# Patient Record
Sex: Male | Born: 1950
Health system: Southern US, Community
[De-identification: ages and names within clinical notes are randomized; demographics above are authoritative.]

## PROBLEM LIST (undated history)

## (undated) DIAGNOSIS — E7800 Pure hypercholesterolemia, unspecified: Secondary | ICD-10-CM

## (undated) DIAGNOSIS — E11319 Type 2 diabetes mellitus with unspecified diabetic retinopathy without macular edema: Secondary | ICD-10-CM

## (undated) DIAGNOSIS — K219 Gastro-esophageal reflux disease without esophagitis: Secondary | ICD-10-CM

## (undated) DIAGNOSIS — I10 Essential (primary) hypertension: Secondary | ICD-10-CM

## (undated) DIAGNOSIS — R29818 Other symptoms and signs involving the nervous system: Secondary | ICD-10-CM

## (undated) DIAGNOSIS — G4730 Sleep apnea, unspecified: Secondary | ICD-10-CM

## (undated) DIAGNOSIS — G8929 Other chronic pain: Secondary | ICD-10-CM

## (undated) DIAGNOSIS — Z87442 Personal history of urinary calculi: Secondary | ICD-10-CM

## (undated) DIAGNOSIS — N419 Inflammatory disease of prostate, unspecified: Secondary | ICD-10-CM

## (undated) DIAGNOSIS — R911 Solitary pulmonary nodule: Secondary | ICD-10-CM

## (undated) DIAGNOSIS — I441 Atrioventricular block, second degree: Secondary | ICD-10-CM

## (undated) DIAGNOSIS — N183 Chronic kidney disease, stage 3 unspecified: Secondary | ICD-10-CM

## (undated) DIAGNOSIS — I2510 Atherosclerotic heart disease of native coronary artery without angina pectoris: Secondary | ICD-10-CM

## (undated) DIAGNOSIS — M545 Low back pain, unspecified: Secondary | ICD-10-CM

## (undated) DIAGNOSIS — M1990 Unspecified osteoarthritis, unspecified site: Secondary | ICD-10-CM

## (undated) DIAGNOSIS — Z9861 Coronary angioplasty status: Secondary | ICD-10-CM

## (undated) DIAGNOSIS — E119 Type 2 diabetes mellitus without complications: Secondary | ICD-10-CM

## (undated) HISTORY — PX: BACK SURGERY: SHX140

## (undated) HISTORY — DX: Inflammatory disease of prostate, unspecified: N41.9

## (undated) HISTORY — DX: Type 2 diabetes mellitus with unspecified diabetic retinopathy without macular edema: E11.319

## (undated) HISTORY — DX: Morbid (severe) obesity due to excess calories: E66.01

## (undated) HISTORY — DX: Solitary pulmonary nodule: R91.1

---

## 2001-01-21 ENCOUNTER — Encounter: Payer: Self-pay | Admitting: Family Medicine

## 2001-01-21 ENCOUNTER — Encounter: Admission: RE | Admit: 2001-01-21 | Discharge: 2001-01-21 | Payer: Self-pay | Admitting: Family Medicine

## 2001-11-23 HISTORY — PX: CORONARY ANGIOPLASTY WITH STENT PLACEMENT: SHX49

## 2001-12-23 ENCOUNTER — Ambulatory Visit (HOSPITAL_COMMUNITY): Admission: RE | Admit: 2001-12-23 | Discharge: 2001-12-24 | Payer: Self-pay | Admitting: Cardiology

## 2002-04-23 HISTORY — PX: CARDIAC CATHETERIZATION: SHX172

## 2002-04-25 ENCOUNTER — Ambulatory Visit (HOSPITAL_COMMUNITY): Admission: RE | Admit: 2002-04-25 | Discharge: 2002-04-25 | Payer: Self-pay | Admitting: Cardiology

## 2002-09-19 ENCOUNTER — Encounter: Admission: RE | Admit: 2002-09-19 | Discharge: 2002-09-19 | Payer: Self-pay | Admitting: Family Medicine

## 2002-09-19 ENCOUNTER — Encounter: Payer: Self-pay | Admitting: Family Medicine

## 2002-12-15 ENCOUNTER — Encounter: Admission: RE | Admit: 2002-12-15 | Discharge: 2002-12-15 | Payer: Self-pay | Admitting: *Deleted

## 2002-12-15 ENCOUNTER — Encounter: Payer: Self-pay | Admitting: Family Medicine

## 2003-01-15 ENCOUNTER — Encounter: Payer: Self-pay | Admitting: Family Medicine

## 2003-01-15 ENCOUNTER — Encounter: Admission: RE | Admit: 2003-01-15 | Discharge: 2003-01-15 | Payer: Self-pay | Admitting: Family Medicine

## 2003-03-07 ENCOUNTER — Ambulatory Visit (HOSPITAL_BASED_OUTPATIENT_CLINIC_OR_DEPARTMENT_OTHER): Admission: RE | Admit: 2003-03-07 | Discharge: 2003-03-07 | Payer: Self-pay | Admitting: Urology

## 2003-03-14 ENCOUNTER — Ambulatory Visit (HOSPITAL_BASED_OUTPATIENT_CLINIC_OR_DEPARTMENT_OTHER): Admission: RE | Admit: 2003-03-14 | Discharge: 2003-03-14 | Payer: Self-pay | Admitting: Urology

## 2003-03-14 HISTORY — PX: OTHER SURGICAL HISTORY: SHX169

## 2003-08-03 ENCOUNTER — Encounter (INDEPENDENT_AMBULATORY_CARE_PROVIDER_SITE_OTHER): Payer: Self-pay | Admitting: Specialist

## 2003-08-03 ENCOUNTER — Ambulatory Visit (HOSPITAL_COMMUNITY): Admission: RE | Admit: 2003-08-03 | Discharge: 2003-08-03 | Payer: Self-pay | Admitting: Gastroenterology

## 2006-03-30 ENCOUNTER — Ambulatory Visit (HOSPITAL_COMMUNITY): Admission: AD | Admit: 2006-03-30 | Discharge: 2006-03-31 | Payer: Self-pay | Admitting: Cardiology

## 2006-03-30 HISTORY — PX: CORONARY ANGIOPLASTY WITH STENT PLACEMENT: SHX49

## 2007-11-24 HISTORY — PX: OTHER SURGICAL HISTORY: SHX169

## 2008-07-31 ENCOUNTER — Ambulatory Visit (HOSPITAL_COMMUNITY): Admission: RE | Admit: 2008-07-31 | Discharge: 2008-08-01 | Payer: Self-pay | Admitting: Specialist

## 2011-02-24 ENCOUNTER — Encounter: Payer: Self-pay | Admitting: Family Medicine

## 2011-02-24 DIAGNOSIS — Z9861 Coronary angioplasty status: Secondary | ICD-10-CM

## 2011-02-24 DIAGNOSIS — E1165 Type 2 diabetes mellitus with hyperglycemia: Secondary | ICD-10-CM | POA: Insufficient documentation

## 2011-02-24 DIAGNOSIS — E119 Type 2 diabetes mellitus without complications: Secondary | ICD-10-CM | POA: Insufficient documentation

## 2011-02-24 DIAGNOSIS — I2510 Atherosclerotic heart disease of native coronary artery without angina pectoris: Secondary | ICD-10-CM | POA: Insufficient documentation

## 2011-02-24 DIAGNOSIS — E785 Hyperlipidemia, unspecified: Secondary | ICD-10-CM | POA: Insufficient documentation

## 2011-02-24 DIAGNOSIS — K219 Gastro-esophageal reflux disease without esophagitis: Secondary | ICD-10-CM | POA: Insufficient documentation

## 2011-02-24 DIAGNOSIS — I10 Essential (primary) hypertension: Secondary | ICD-10-CM

## 2011-02-24 DIAGNOSIS — E7849 Other hyperlipidemia: Secondary | ICD-10-CM | POA: Insufficient documentation

## 2011-04-07 NOTE — Op Note (Signed)
NAME:  Erik Hancock, Erik Hancock                ACCOUNT NO.:  [id number]   MEDICAL RECORD NO.:  [id number]          PATIENT TYPE:  OIB   LOCATION:  2550                         FACILITY:  [facility]   PHYSICIAN:  Kerrin Champagne, M.D.   DATE OF BIRTH:  [birth year]   DATE OF PROCEDURE:  DATE OF DISCHARGE:                               OPERATIVE REPORT   PREOPERATIVE DIAGNOSIS:  Left C6-C7, herniated nucleus pulposus,  degenerative disk disease C4-C5, C5-C6 and C6-C7 with mild cervical  stenosis C4-C5, C5-C6 and C6-C7.   POSTOPERATIVE DIAGNOSIS:  Left C6-C7, herniated nucleus pulposus,  degenerative disk disease C4-C5, C5-C6 and C6-C7 with mild cervical  stenosis C4-C5, C5-C6 and C6-C7.   PROCEDURE:  Left C6-C7 [place] foraminotomy with excision of herniated  nucleus pulposus.   SURGEON:  Vira Browns, M.D.   ASSISTANT:  Wende Neighbors, P.A.   ANESTHESIA:  General via orotracheal intubation Dr. Gelene Mink.   FINDINGS:  HNP of 3 free fragments left C6-C7.   SPECIMENS:  None.   ESTIMATED BLOOD LOSS:  100 mL.   COMPLICATIONS:  None.   DISPOSITION:  The patient returned to PACU in good condition.   HISTORY OF PRESENT ILLNESS:  A 60 year old male with a roughly 3 week  history of increasing severe neck pain and radiation in left arm.  He  was seen initially in the office and placed on Medrol Dosepak.  He  underwent MRI study which demonstrated HNP in the left C6-C7.  He has  had severe pain secondary to OxyContin and has a history of cardiac  disease.  He was seen by his cardiologist with preoperative evaluation.  He was admitted with intractable left cervical radiculopathy and pain  requiring narcotic medicines without significant improvement with  significant weakness in left triceps, left wrist, volar flexion and  finger extension.  MRI scan of left C6-C7 revealed lateral disk  herniation, mild cervical stenosis C5-C6, and C6-C7 with degenerative  disk changes at C4-C5, C5-C6 and C6-C7.   Intraoperative findings, HNP  with 3 free fragments at left C6-C7.   DESCRIPTION OF PROCEDURE:  After adequate general anesthesia, the  patient was placed in a prone position, chest rolls and head  on the  [place], well padded.  Taped shoulders backwards.   Wrist restraints to allow for x-ray intraoperatively.  The patient had  PAS stockings intraoperatively, diminished risk of deep venous  thrombosis.  Standard prep under prep solution over the posterior aspect  of the neck.  The patient also had standard preoperative antibiotics of  Ancef.  An incision was made over the posterior aspect of the neck at  the most prominent spinous process expected to be C7 and extended  cranially 1-1/5 through the skin and subcutaneous layers.  Bleeders were  controlled using electrocautery down to the ligament of nuchae overlying  the spinous process at expected C7 and C6.  Electrocautery was then used  to incise the ligament of nuchae off the posterior aspect of the spinous  process at left C6 and C7.  A clamp placed over the expected spinous  process at C6 and  intraoperative lateral radiographs demonstrated the  spinous process at C6 with clamp attached.  This area was marked for  continued identification using a single #1 Vicryl suture.  Clamps were  then used carefully to elevate the cervical muscles off the left side of  the lamina of C6 and C7 dividing using electrocautery.  Bleeders were  controlled using mono and bipolar electrocautery.  Boss McCullough  retractor inserted at the expected C6-C7 level.  Posterior aspect of the  left side of the posterior elements was exposed using loupe  magnification and head lamp.  During this portion of the procedure,  bleeders again were controlled using electrocautery.  A high speed bur  was used to remove a small portion of the inferior aspect of the lamina  at C6 and medial aspect of C6 inferior articular process of  approximately 10 to 15%.  A 1  millimeter Kerrison was entered into the  spinal canal over the superior aspect of the lamina at C7 and then  continued resection of small amount of superior lateral aspect of left  C7 lamina continuing laterally resecting a small portion of the medial  aspect of the superior articular process of C7 up to the level of the  left C7 pedicle.  Foraminotomy performed over the C7 nerve root.  A  small fibrovascular lesion overlying the C7 nerve roots then carefully  elevated with titanium nerve hook and cauterized and divided.  Operating  room microscope draped sterilely and brought into the field.  Under the  operating room microscope, the C7 nerve root was well decompressed for  foraminotomy allowing for exposure of at least the lateral 3 to 4  millimeters of the nerve root entering and exiting on to the  neuroforamen.  A straight and forward angle 3-0 microcurette was used to  debride the small amount of the superior medial aspect of the pedicle at  C7 allowing for retraction of the C7 nerve root superiorly with  identification of the disk herniation here.  Bleeding was controlled  during this portion of the case using thrombin-soaked Gelfilm and small  quarter-inch pledgets.  One-eighth inch pledgets were used as well.  Eventually. this material was able to be removed.  At least 3 free  fragments of this material were removed from the posterior lateral  aspect of the left disk space.  Following this, all venous bleedings  diminished.  The C7 nerve root appeared to exit out the neural foramen  without any further nerve compression.  Bleeding was stabilized using  thrombin-soaked Gelfilm irrigation.  All bleeding was completely stopped  and all Gelfilm was removed.  Nerve root appeared to be exiting off the  neuroforamen without any further nerve compression.  Soft tissues were  allowed to fall back into place.  The ligament of nuchal layer was  approximated with interrupted 0 Vicryl sutures  using the UR6 needle.  Deep subcutaneous layers were approximated with interrupted 0 Vicryl  suture and more superficial layers with interrupted 2-0 Vicryl sutures.  Skin was closed with a running subcu stitch of 4-0 Vicryl.  Dermabond  applied.  4 x 4s was fixed to the skin with Hypafix  tape.  The patient  had a soft cervical collar applied.  The patient was then returned to  supine position, reactivated, extubated and returned to recovery room in  satisfactory condition.  Both intraoperatively, time-out was taken to  identify the patient, the correct side, and the expected procedure for  C6-C7 foraminotomy.  The patient also had a mark inclined to the left  lower neck in the preoperative holding area in order to identify the  site and the correct level.  All instrument and sponge counts were  correct at the end of the case.      Kerrin Champagne, M.D.  Electronically Signed     JEN/MEDQ  D:  07/31/2008  T:  08/01/2008  Job:  [id number]

## 2011-04-10 NOTE — Cardiovascular Report (Signed)
Erik Hancock, FANT NO.:  [id number]   MEDICAL RECORD NO.:  [id number]          PATIENT TYPE:  OIB   LOCATION:  2854                         FACILITY:  [facility]   PHYSICIAN:  Armanda Magic, M.D.     DATE OF BIRTH:  [birth year]   DATE OF PROCEDURE:  03/30/2006  DATE OF DISCHARGE:                              CARDIAC CATHETERIZATION   REFERRING PHYSICIAN:  Dr. Lupe Carney   PROCEDURE:  1.  Left heart catheterization.  2.  Coronary angiography.  3.  Left ventriculography.   OPERATOR:  Armanda Magic, M.D.   INDICATIONS:  Chest pain.   COMPLICATIONS:  IV access via right femoral artery 6-French sheath.   This is a very pleasant 60 year old white male with a history of coronary  disease status post PTCA stenting of the LAD and non-obstructive disease  elsewhere who presents now with recurrent angina and now presents for  cardiac catheterization.   The patient is brought to cardiac catheterization laboratory in the fasting  nonsedated state.  Informed consent was obtained.  Patient was connected to  continuous heart rate and pulse oximetry monitoring, intermittent blood  pressure monitoring.  The right groin was prepped and draped in sterile  fashion.  1% Xylocaine was used for local anesthesia.  Using a modified  Seldinger technique a 6-French sheath was placed in the right femoral  artery.  Under fluoroscopic guidance a 6-French JL4 catheter was placed in  the vicinity of the left coronary ostium, but could not adequately engage  it.  It was exchanged out over a guidewire for a 6-French JL5 catheter which  successfully engaged the left coronary ostium.  Multiple cine films were  taken in a 30 degree RAO, 40 degree LAO views.  This catheter was then  exchanged out over a guidewire for a 6-French JR4 catheter which was placed  under guide fluoroscopic guidance in the right coronary artery.  Multiple  cine films were taken in the 30 degree RAO, 40 degree LAO  views.  This  catheter was then exchanged out over a guidewire for a 6-French angled  pigtail catheter which was placed under fluoroscopic guidance in the left  ventricular cavity.  Left ventriculography was performed in a 30 degree RAO  view using a total of 30 mL of contrast at 15 mL per second.  Catheter was  then pulled back across the aortic valve with no significant gradient noted.  At the end of procedure the catheter was removed over a guidewire and the  patient went on to PCI of the left circumflex and PDA by Dr. Amil Amen.   RESULTS:  The left main coronary artery is widely patent.  It bifurcates in  the left anterior descending artery and left circumflex artery.  The left  anterior descending artery is widely patent throughout its course.  There is  a stent in the proximal portion which is widely patent.  It gives rise to  two diagonal branches both of which are widely patent.   The left circumflex gives rise to a very high obtuse one branch which is  widely  patent.  The proximal left circumflex is widely patent and then in  the mid portion there is a 30% narrowing.  In the mid to distal portion  there is an 90% complex lesion involving the takeoff of an OM2 branch.  The  OM2 branch appears patent.  The ongoing circumflex traverses the AV groove  and bifurcates into a third obtuse marginal branch distally.   The right coronary artery is widely patent throughout its course and  distally bifurcates into posterior descending artery and posterior lateral  artery.  There is a 95% mid stenosis in the posterior descending artery.   Left ventriculography shows low normal LV systolic function, EF 50-55%.  LV  pressure 150/8 mmHg.  Aortic pressure 160/78 mmHg   ASSESSMENT:  1.  Two-vessel obstructive coronary disease, 90% left circumflex, 95%      posterior descending artery, and a patent left anterior descending      stent.  2.  Low normal left ventricular function.  3.   Hypertension with elevated blood pressure.  4.  Non-insulin-dependent diabetes mellitus.  5.  Dyslipidemia.   PLAN:  PCI of the left circumflex PDA by Dr. Amil Amen.  Aspirin and Plavix.  Increase lisinopril to 30 mg a day for better blood pressure control and  check a fasting Statin panel.      Armanda Magic, M.D.  Electronically Signed     TT/MEDQ  D:  03/30/2006  T:  03/31/2006  Job:  [id number]

## 2011-04-10 NOTE — Op Note (Signed)
NAME:  Erik Hancock, Erik Hancock                          ACCOUNT NO.:  [id number]   MEDICAL RECORD NO.:  [id number]                   PATIENT TYPE:  AMB   LOCATION:  [facility]                                 FACILITY:  [facility]   PHYSICIAN:  Maretta Bees. Vonita Moss, M.D.             DATE OF BIRTH:  June 15, 1951   DATE OF PROCEDURE:  03/14/2003  DATE OF DISCHARGE:                                 OPERATIVE REPORT   PREOPERATIVE DIAGNOSES:  Left ureteral calculus.   POSTOPERATIVE DIAGNOSES:  Left ureteral calculus.   PROCEDURE:  Cystoscopy, left ureteroscopy with stone fragmentation with  holmium laser and stone basketing, left retrograde pyelogram with  interpretation, left double J catheter insertion.   SURGEON:  Maretta Bees. Vonita Moss, M.D.   ANESTHESIA:  General.   INDICATIONS FOR PROCEDURE:  This 60 year old gentleman has had intermittent  pain since earlier this year due to a 5 mm stone in the upper ureter that  has moved down to the lower ureter and one week ago cystoscopy, ureteroscopy  and partial fragmentation of the stone was accomplished, but because of  narrowed tight area where the stone is, it was felt best to leave in the  double J and come back for a second procedure for safety purposes at which  time it was expected that ureteroscopy would be easier with a double J  catheter in for a week.   DESCRIPTION OF PROCEDURE:  The patient was brought to the operating room,  placed in lithotomy position, external genitalia were prepped and draped in  the usual fashion. He was cystoscoped and the end of the left double J  catheter was pulled out and a guidewire placed up through the double J  catheter and into the kidney and the double J catheter was then pulled out  over the wire. I then used a 6 French ureteroscope and this time was easily  able to get up to the stone. It looked too big to extract with a basket, so  I fragmented it into several pieces with the holmium laser. I then used the  nitinol  stone basket to retrieve the large pieces and there was some fine  sandy material that would not get in the basket but should wash out easily.  I then ureteroscoped him further proximally and Erik Hancock no further stones. I  injected contrast and he had some residual caliectasis at the site where the  stone was that was just minor mucosal disruption but no extravasation. I  then put up a 6 French 28 cm double J catheter with a full coil in the renal  pelvis and a full coil in the bladder and the spring brought out per  urethra. Then I recystoscoped and washed out at least three of the stone  fragments which were given to his wife. The bladder was emptied, the scope  removed and the patient sent to the recovery room in good  condition having  tolerated the procedure well.                                               Maretta Bees. Vonita Moss, M.D.    LJP/MEDQ  D:  03/14/2003  T:  03/14/2003  Job:  [id number]

## 2011-04-10 NOTE — Cardiovascular Report (Signed)
[place]. [facility]  Patient:    JAMONTA, GOERNER Visit Number: [id number] MRN: [id number]          Service Type: CAT Location: [facility] [id number] Attending Physician:  Armanda Magic Dictated by:   Armanda Magic, M.D. Proc. Date: 04/25/02 Admit Date:  04/25/2002 Discharge Date: 04/25/2002   CC:         Melvyn Neth D. Clovis Riley, M.D.   Cardiac Catheterization  DATE OF BIRTH:  06/29/1951  REFERRING PHYSICIAN:  Abran Cantor. Clovis Riley, M.D.  CHIEF COMPLAINT:  A 60 year old male with a history of hyperlipidemia and coronary disease status post PTCA and stenting of the LAD in January 2003.  He now presents with recurrent chest pain.  PROCEDURE:  Left heart catheterization, coronary angiography, left ventriculography.  OPERATOR:  Armanda Magic, M.D.  INDICATIONS:  Chest pain and coronary disease.  COMPLICATIONS:  None.  IV ACCESS:  Via right femoral artery, #6 French sheath.  DESCRIPTION OF PROCEDURE:  The patient was brought to the cardiac catheterization laboratory in the fasting nonsedated state.  Informed consent was obtained.  The patient was connected to continuous heart rate and pulse oximetry monitoring and intermittent blood pressure monitoring.  The right groin was prepped and draped in a sterile fashion.  Xylocaine, 1%, was used for local anesthesia.  Using the modified Seldinger technique, a #6 French sheath was placed in the right femoral artery.  Under fluoroscopic guidance, a #6 [place] JL4 catheter was placed in the left coronary artery.  Multiple cine films were taken in a 30-degree RAO, 40-degree LAO view.  This catheter was then exchanged out over a guidewire for a #6 [place] JR4 catheter which was placed in the right coronary artery under fluoroscopic guidance.  Multiple cine films were taken in the 30-degree RAO, 40-degree LAO view.  This catheter was then exchanged out over a guidewire for a #6 French angled pigtail catheter which was placed  under fluoroscopic guidance in the left ventricular cavity.  Left ventriculography was performed in a 30-degree RAO view using a total of 30 cc of contrast at 13 cc per second.  The catheter was then pulled back across the aortic valve with no significant gradient noted.  At the end of the procedure, all catheters and sheaths were removed.  Manual compression was performed until adequate hemostasis was obtained.  The patient was transferred back to the room in stable condition.  RESULTS: 1. The left main coronary artery is widely patent and bifurcates into the    left anterior descending artery and left circumflex artery.  2. The left anterior descending artery is widely patent throughout its course.    It gives rise to two diagonal branches, both of which are widely patent.  3. The left circumflex gives rise to two high obtuse marginal branches, both    of which are small vessels but widely patent.  Distal to the takeoff of the    second marginal branch, there is a 30% narrowing in the left circumflex    before giving off a large third marginal branch which is widely patent.    The ongoing circumflex has a 30% narrowing after the takeoff of the third    obtuse marginal branch.  4. The right coronary artery is widely patent throughout its course and    bifurcates distally into a posterior descending artery and posterolateral    artery.  The posterior descending artery has a mid 60-70% narrowing.  LEFT VENTRICULOGRAPHY:  Left ventriculography performed  in the 30-degree RAO view using a total of 30 cc of contrast at 13 cc per second showed normal LV systolic function.  LV pressure was 144/17 mmHg.  Aortic pressure was 142/90 mmHg.  ASSESSMENT: 1. Patent left anterior descending artery stent site. 2. Nonobstructive coronary disease with borderline obstructive disease of    the posterior descending artery. 3. Normal left ventricular function.  PLAN: 1. Review of films by Dr. Corliss Marcus with probably stress Cardiolite to further evaluate the significance of the posterior descending artery lesion    for ischemia. 2. IV fluids and bed rest for 6 hours. 3. Recheck statin panel. Dictated by:   Armanda Magic, M.D. Attending Physician:  Armanda Magic DD:  04/25/02 TD:  04/26/02 Job: [id number]

## 2011-04-10 NOTE — Cardiovascular Report (Signed)
[place]. [facility]  Patient:    Erik Hancock, Erik Hancock Visit Number: [id number] MRN: [id number]          Service Type: CAT Location: 6500 [id number] Attending Physician:  Armanda Magic Dictated by:   Francisca December, M.D. Proc. Date: 12/23/01 Admit Date:  12/23/2001   CC:         Armanda Magic, M.D.  Abran Cantor. Clovis Riley, M.D.  Cardiac Catheterization Laboratory   Cardiac Catheterization  PROCEDURES PERFORMED: 1. Percutaneous coronary intervention/stent implantation, mid LAD. 2. Percutaneous closure, right femoral artery/Perclose.  INDICATION: The patient is a 60 year old man with diabetes, hypertension, and hypercholesterolemia, who has developed exertional angina. Dr. Mayford Knife has completed coronary angiography revealing a mid LAD stenosis of 80-90% involving a small to moderate sized diagonal branch. He is to undergo percutaneous intervention at this time for definitive revascularization.  DESCRIPTION OF PROCEDURE: PCI was performed following the exchange of the 6 French catheter sheath placed by Dr. Mayford Knife for a 7 French catheter sheath over a long guiding J wire. The patient received 5700 units of heparin intravenously. He also received a double bolus of Integrilin in constant infusion. This resulted in an ACT of 231 seconds. A 7 [place] FL4, Scimed, wiseguide, guiding catheter was advanced to the ascending aorta where the left coronary os was engaged. The 0.014 inch Scimed, luge intracoronary guide wire was passed across the lesion in the mid LAD without difficulty. A 0.014 Guidant ACS Hi-Torque Floppy guide wire was advanced into the diagonal branch. A 3.5/15 mm Scimed, Express II intracoronary stent was placed across the lesion in the mid LAD carefully and deployed there to a peak pressure of 15 atmospheres for one minute. This resulted in wide patency. The guide wire in the diagonal branch was then removed. There was no compromise at the origin of the  diagonal branch. The guide wire in the LAD was removed and cineangiography performed in orthogonal views confirming wide patency. The guiding catheter was then removed as well as the guiding sheath. Arteriotomy closure was successful following the use of the Perclose device.  A 45 degree RAO right femoral angiogram was performed prior to deployment of this device confirming the arteriotomy to be above the bifurcation into the superficial femoral and posterior profunda femoral branches. There was no evidence of atherosclerosis in the common femoral artery. There was superficial "oozing" at the skin entry sites. This was resolved with the use of the [place]-seal. A sterile dressing was applied and the patient was transported to the recovery area in stable condition with an intact distal pulse.  ANGIOGRAPHY: As mentioned, the lesion treated was in the midportion of the anterior descending artery and extended for about 8 to 10 mm in its severest form. It was over 20 mm in length total. The distal portion of the lesion was very mild. Therefore I did not plan to cover this portion with the stents. Following stent implantation with balloon dilatation, there was wide patency in both the diagonal branch and the anterior descending artery with no significant residual stenosis.  FINAL IMPRESSION: 1. Atherosclerotic coronary vascular disease, single-vessel. 2. Status post successful percutaneous transluminal coronary angioplasty stent    implantation, mid left anterior descending artery. 3. Typical angina was reproduced with device insertion and balloon inflation. Dictated by:   Francisca December, M.D. Attending Physician:  Armanda Magic DD:  12/23/01 TD:  12/23/01 Job: [id number]

## 2011-04-10 NOTE — Cardiovascular Report (Signed)
[name], [name] NO.:  [id number]   MEDICAL RECORD NO.:  [id number]          PATIENT TYPE:  OIB   LOCATION:  2807                         FACILITY:  [facility]   PHYSICIAN:  Francisca December, M.D.  DATE OF BIRTH:  1951/06/27   DATE OF PROCEDURE:  03/30/2006  DATE OF DISCHARGE:                              CARDIAC CATHETERIZATION   PROCEDURES PERFORMED:  1.  PCI/drug-eluting stent implantation posterior descending artery.  2.  PCI/drug-eluting stent implantation mid left circumflex.   INDICATIONS:  [name] is a 60 year old diabetic man with known  ASCVD several years status post PCI/DE stent LAD.  He has done well since  then.  He has recently presented to Dr. Mayford Knife with recurrent anginal chest  discomfort.  She has completed cardiac catheterization which as revealed  subtotal stenoses in the left circumflex and poster descending artery.  He  is to undergo catheter-based revascularization at this time.   PROCEDURAL NOTE:  Via the previously placed 6-French catheter sheath, a 6-  [place] #4 right guiding catheter was advanced to the descending aorta where  the right coronary os was engaged.  The patient received 50 units/kg of IV  heparin and a 25 mcg/kg bolus of Aggrastat and constant infusion.  The  resultant ACT was 216 seconds.  He then received an additional 1500 units of  heparin, increasing the ACT to 306 seconds.  A 0.014-inch Luge intracoronary  guidewire was passed across the lesion in the posterior descending artery  without difficulty.  It was predilated using a 2.5/15 mm Scimed Maverick  intracoronary balloon.  This was inflated to 6 atmospheres for approximately  30 seconds.  The balloon was deflated removed and a 3.0/16 mm Scimed Taxus  intracoronary drug-eluting stent was advanced into place, carefully  positioned, and deployed at peak pressure of 16 atmospheres.  The stent  balloon was deflated and removed and the device was post dilated  initially  with a 3.25/12 mm Quantum Maverick intracoronary balloon inflated to 18-20  atmospheres.  This still allowed a slight residual at the site of the focal  95% stenosis in the poster descending artery.  Therefore, a 3.5/12 mm  Quantum Maverick was advanced into place and inflated to 18 atmospheres.  This resulted in an excellent angiographic appearance of the lesion.  This  was confirmed in orthogonal views both with and without the guidewire in  place.  The guiding catheter was then removed and replaced with a 6-French  #3.5 left Voda guiding catheter.  The Luge wire was again used to cross a  90% stenosis in the mid portion of the left circumflex.  This was at the  origin of a large marginal branch.  The lesion was primarily stented using a  3.0/20 mm Scimed Taxus intracoronary drug-eluting stent.  This was deployed  at a peak pressure of 14 atmospheres.  The stent balloon was deflated and  removed and the device was post dilated using a 3.25/12 mm Quantum Maverick.  This was inflated in the distal segment to 16 atmospheres and in the  proximal segment to 16  atmospheres.  There was excellent angiographic result  with only minimal narrowing at the origin of the large marginal branch.   The guidewire and guiding catheter were removed and adequate patency  confirmed in orthogonal views.  The catheter sheath was then sutured into  place and the patient was transported to recovery area in stable condition  with an intact distal pulse.   ANGIOGRAPHY:  As mentioned, the initial lesion treated was in the mid  portion of the posterior descending artery.  It was 95% stenotic and quite  focal.  Following balloon dilatation and stent implantation, there was a 10%  residual stenosis.  The lesion in the left circumflex, again, was in the mid  portion at the origin of a large marginal branch.  It is the anatomic second  marginal branch but the first large marginal branch.  The bulk of the  lesion  was opposite the origin of the marginal branch.  Following balloon  dilatation and stent implantation, there was no residual stenosis in the  circumflex and its ongoing branch.  There was a 50% stenosis in the origin  of a large marginal branch.   FINAL IMPRESSION:  1.  Atherosclerotic cardiovascular disease, three-vessel.  2.  Widely patent drug-eluting stent, anterior descending artery.  3.  Status post successful percutaneous coronary intervention/drug-eluting      stent implantation posterior descending artery.  4.  Status post successful percutaneous coronary intervention/drug-eluting      stent implantation mid left circumflex.  5.  Typical angina was not reproduced with device insertion or balloon      inflation.      Francisca December, M.D.  Electronically Signed     JHE/MEDQ  D:  03/30/2006  T:  03/31/2006  Job:  [id number]

## 2011-04-10 NOTE — Op Note (Signed)
NAME:  Erik Hancock, Erik Hancock                          ACCOUNT NO.:  [id number]   MEDICAL RECORD NO.:  [id number]                   PATIENT TYPE:  AMB   LOCATION:  [facility]                                 FACILITY:  [facility]   PHYSICIAN:  Maretta Bees. Vonita Moss, M.D.             DATE OF BIRTH:  04/06/1951   DATE OF PROCEDURE:  03/07/2003  DATE OF DISCHARGE:                                 OPERATIVE REPORT   PREOPERATIVE DIAGNOSIS:  Distal left ureteral calculus.   POSTOPERATIVE DIAGNOSIS:  Distal left ureteral calculus.   PROCEDURE:  1. Cystoscopy.  2. Left retrograde pyelogram with interpretation.  3. Left ureteroscopy and Holmium laser stone fragmentation and placement of     left double J catheter.   SURGEON:  Maretta Bees. Vonita Moss, M.D.   ANESTHESIA:  General.   INDICATIONS:  This 60 year old white male was sent for office consultation  in February for evaluation of intermittent left flank pain and a CT scan  that showed hydronephrosis due to a small upper ureteral calculus.  He was  pain-free for a while, but then has come back with several days of severe  left flank pain.  A repeat CT scan now shows hydronephrosis down to a stone  in the distal left ureter.  It measured 3 x 5 mm in size.  It could not be  seen on previous KUB before.  It was felt that the ureteroscopy was  appropriate and the patient wished something be done because of the severe  pain and symptoms.   PROCEDURE:  The patient was brought to the operating room, placed in  lithotomy position.  External genitalia were prepped and draped in the usual  fashion.  He was cystoscoped.  Anterior urethra and prostatic urethra were  unremarkable.  The bladder had no lesions in it.  The ureteral orifices were  fairly small.  A guidewire was placed up the left ureter beyond this very  poorly visualized stone.  I then did a balloon dilation of the intramural  ureter with the 4 cm balloon to 10 atmospheres of pressure.  I then inserted  a  6 French short ureteroscope and could get up just to below the stone.  I  could not reach the stone with the scope and repeated a balloon dilation and  rescoped him and still could see the stone, but could not get right up to it  or beyond it.  I tried the Nitinol and Segura stone baskets and none of them  would engage the stone.  It seemed to be adherent to the mucosal wall.  I  then went ahead with the Holmium laser therapy and was able to fragment some  chips off the distal end of the stone but then visualization became  difficult due to poor irrigation flow.  I felt that further attempts at this  point may risk damage to the ureter and I felt it  best to put up the double  J catheter which I did after performing left retrograde pyelogram which  showed the pyelocaliceal system to be intact with mild hydronephrosis.  I  utilized a 6 [place] 26 cm double J catheter which coiled nicely in the upper  calix and a full coil in the bladder.  I will bring him back to the OR in  one week for repeat ureteroscopy if he does not pass his stone in the  meantime.  I believe we have a much better chance and less risk of second  attempt to remove this difficult impacted stone.  He was then taken to  recovery room in good condition.                                               Maretta Bees. Vonita Moss, M.D.   LJP/MEDQ  D:  03/07/2003  T:  03/07/2003  Job:  [id number]

## 2011-04-10 NOTE — Cardiovascular Report (Signed)
[place]  Patient:    Erik Hancock, Erik Hancock Visit Number: [id number] MRN: [id number]          Service Type: CAT Location: [id number] Attending Physician:  Armanda Magic Dictated by:   Armanda Magic, M.D. Proc. Date: 12/23/01 Admit Date:  12/23/2001   CC:         Melvyn Neth D. Clovis Riley, M.D.   Cardiac Catheterization  CHIEF COMPLAINT:  This is a 60 year old white male with a history of diabetes and hypertension, who presents with several episodes of exertional angina.  He now presents for cardiac catheterization.  PROCEDURES PERFORMED: 1. Left heart catheterization. 2. Coronary angiography. 3. Left ventriculography.  OPERATOR:  Armanda Magic, M.D.  INDICATIONS:  Exertional angina.  COMPLICATIONS:  None.  IV ACCESS:  Via right femoral artery, 6-French sheath.  DESCRIPTION OF PROCEDURE:   The patient was brought to the cardiac catheterization laboratory in the fasting nonsedated state.  Informed consent was obtained.  The patient was connected to continuous heart rate and pulse oximetry monitoring with intermittent blood pressure monitoring.  The right groin was prepped and draped in a sterile fashion.  One percent Xylocaine was used for local anesthesia.  Using the modified Seldinger technique, a 6-French sheath was placed in the right femoral artery.  Under fluoroscopic guidance, a 6-French JL-4 catheter was placed in the left coronary artery.  Multiple cine films were taken in the 30 degree RAO and 40 degree LAO views.  This catheter was then exchanged out over a guide wire for a 6-French JR-4 catheter which was placed under fluoroscopic guidance in the right coronary artery.  Multiple cine films were taken in the 30 degree RAO and 40 degree LAO views.  This catheter was then exchanged out over a guide wire for a 6-French angled pigtail catheter which was placed in the left ventricular cavity.  Left ventriculography was performed in the 30  degree RAO view using a total of 30 cc of contrast at 13 cc/sec.  The catheter was then pulled back across the aortic valve.  At the end of the procedure, the catheter was removed and the 6-French femoral sheath was exchanged out for a 7-French femoral sheath.  The patient subsequently went on to percutaneous transluminal intervention by Dr. Corliss Marcus.  RESULTS:  The left main coronary artery is widely patent and trifurcates into the left anterior descending artery, a small ramus branch, and a left circumflex artery.  The left anterior descending artery has an 80% proximal narrowing at the takeoff of the first diagonal branch.  The diagonal is widely patent.  The rest of the left anterior descending artery is widely patent throughout its course, giving rise to a second diagonal branch which is widely patent.  The left circumflex has a 40% eccentric plaque in the proximal to mid portion and then bifurcates distally into two ______ branches, both of which have bifurcating ______ branches distally.  The ramus branch appears widely patent, but is a very small vessel.  The right coronary artery is widely patent throughout its course and bifurcates distally into a posterior descending artery and posterolateral artery.  The posterior descending artery has a mid 40-50% narrowing.  LEFT VENTRICULOGRAPHY:  Left ventriculography performed in the 30 degree RAO view using a total of 30 cc of contrast at 13 cc/sec showed normal left ventricular function.  There are no systolic wall motion abnormalities noted. There is no mitral regurgitation.  HEMODYNAMICS:  Aortic pressure is 127/80 mmHg.  Left ventricular pressure 131/2 mmHg.  ASSESSMENT: 1. Exertional angina. 2. One-vessel obstructive coronary disease. 3. Normal left ventricular function.  PLAN:  PCI per Dr. Amil Amen.  Continue aspirin and Plavix.  Resume previous medications. Dictated by:   Armanda Magic, M.D. Attending Physician:   Armanda Magic DD:  12/23/01 TD:  12/23/01 Job: [id number]

## 2011-08-26 LAB — CBC
MCHC: 32.9
MCV: 89.4
RBC: 5.49

## 2011-08-26 LAB — PROTIME-INR: INR: 0.9

## 2011-08-26 LAB — HEMOGLOBIN A1C
Hgb A1c MFr Bld: 11.1 — ABNORMAL HIGH
Mean Plasma Glucose: 272

## 2011-08-26 LAB — DIFFERENTIAL
Basophils Relative: 1
Eosinophils Absolute: 0.2
Monocytes Absolute: 0.6
Monocytes Relative: 7
Neutrophils Relative %: 58

## 2011-08-26 LAB — GLUCOSE, CAPILLARY: Glucose-Capillary: 254 — ABNORMAL HIGH

## 2011-08-26 LAB — BASIC METABOLIC PANEL
BUN: 14
CO2: 25
CO2: 29
Chloride: 101
Chloride: 98
Creatinine, Ser: 0.82
Creatinine, Ser: 0.99
GFR calc Af Amer: >60
Glucose, Bld: 260 — ABNORMAL HIGH

## 2011-11-24 DIAGNOSIS — R911 Solitary pulmonary nodule: Secondary | ICD-10-CM

## 2011-11-24 HISTORY — DX: Solitary pulmonary nodule: R91.1

## 2012-04-01 ENCOUNTER — Emergency Department (HOSPITAL_COMMUNITY)
Admission: EM | Admit: 2012-04-01 | Discharge: 2012-04-01 | Disposition: A | Discharge status: Nursing Home (Includes ICF) | Payer: BC Managed Care – PPO | Source: Home / Self Care | Attending: Family Medicine | Admitting: Family Medicine

## 2012-04-01 ENCOUNTER — Emergency Department (HOSPITAL_COMMUNITY)
Admission: EM | Admit: 2012-04-01 | Discharge: 2012-04-02 | Disposition: A | Discharge status: Home / Self Care | Payer: BC Managed Care – PPO | Attending: Emergency Medicine | Admitting: Emergency Medicine

## 2012-04-01 ENCOUNTER — Encounter (HOSPITAL_COMMUNITY): Payer: Self-pay | Admitting: Emergency Medicine

## 2012-04-01 ENCOUNTER — Encounter (HOSPITAL_COMMUNITY): Payer: Self-pay | Admitting: *Deleted

## 2012-04-01 DIAGNOSIS — N201 Calculus of ureter: Secondary | ICD-10-CM | POA: Insufficient documentation

## 2012-04-01 DIAGNOSIS — Z79899 Other long term (current) drug therapy: Secondary | ICD-10-CM | POA: Insufficient documentation

## 2012-04-01 DIAGNOSIS — N200 Calculus of kidney: Secondary | ICD-10-CM

## 2012-04-01 DIAGNOSIS — E7800 Pure hypercholesterolemia, unspecified: Secondary | ICD-10-CM | POA: Insufficient documentation

## 2012-04-01 DIAGNOSIS — Z7982 Long term (current) use of aspirin: Secondary | ICD-10-CM | POA: Insufficient documentation

## 2012-04-01 DIAGNOSIS — R911 Solitary pulmonary nodule: Secondary | ICD-10-CM | POA: Insufficient documentation

## 2012-04-01 DIAGNOSIS — R109 Unspecified abdominal pain: Secondary | ICD-10-CM | POA: Insufficient documentation

## 2012-04-01 DIAGNOSIS — R350 Frequency of micturition: Secondary | ICD-10-CM | POA: Insufficient documentation

## 2012-04-01 DIAGNOSIS — I10 Essential (primary) hypertension: Secondary | ICD-10-CM | POA: Insufficient documentation

## 2012-04-01 DIAGNOSIS — R319 Hematuria, unspecified: Secondary | ICD-10-CM | POA: Insufficient documentation

## 2012-04-01 DIAGNOSIS — E119 Type 2 diabetes mellitus without complications: Secondary | ICD-10-CM | POA: Insufficient documentation

## 2012-04-01 HISTORY — DX: Essential (primary) hypertension: I10

## 2012-04-01 HISTORY — DX: Pure hypercholesterolemia, unspecified: E78.00

## 2012-04-01 LAB — POCT URINALYSIS DIP (DEVICE)
Ketones, ur: 40 mg/dL — AB
Leukocytes, UA: NEGATIVE
pH: 5.5 (ref 5.0–8.0)

## 2012-04-01 MED ORDER — HYDROMORPHONE HCL PF 1 MG/ML IJ SOLN
2.0000 mg | Freq: Once | INTRAMUSCULAR | Status: AC
  Administered 2012-04-01: 2 mg via INTRAMUSCULAR

## 2012-04-01 MED ORDER — ONDANSETRON HCL 4 MG/2ML IJ SOLN
4.0000 mg | Freq: Once | INTRAMUSCULAR | Status: AC
  Administered 2012-04-01: 4 mg via INTRAMUSCULAR

## 2012-04-01 MED ORDER — ONDANSETRON HCL 4 MG/2ML IJ SOLN
INTRAMUSCULAR | Status: AC
  Filled 2012-04-01: qty 2

## 2012-04-01 MED ORDER — HYDROMORPHONE HCL PF 1 MG/ML IJ SOLN
INTRAMUSCULAR | Status: AC
  Filled 2012-04-01: qty 2

## 2012-04-01 NOTE — ED Notes (Signed)
PT HERE WITH LEFT FLANK INTERMITT PAIN THAT FLARED UP X 3 DYS AGO WITH HEMATURIA BUT EASED UP THEN RESTARTED YESTERDAY WITH CONSTANT SHARP,THROB PAIN.HX KIDNEY STONES IN THE PAST.PT STATES HE CALLED PCP BUT WAS TOLD TO F/U WITH UROLOGIST.PT ALSO REPORTS OF FREQ N/V.NO FEVERS,CHILLS

## 2012-04-01 NOTE — ED Notes (Signed)
Pt states history of kidney stones, pt states that pain started on left side Monday. Pt was passing blood in urine for past three days. Pt states still very painful.

## 2012-04-01 NOTE — ED Provider Notes (Signed)
History     CSN: [id number]  Arrival date & time 04/01/12  1957   None     No chief complaint on file.   (Consider location/radiation/quality/duration/timing/severity/associated sxs/prior treatment) Patient is a 61 y.o. male presenting with flank pain. The history is provided by the patient. No language interpreter was used.  Flank Pain This is a new problem. The problem occurs constantly. The problem has been gradually worsening. Associated symptoms include abdominal pain. The symptoms are aggravated by nothing. The symptoms are relieved by nothing. He has tried nothing for the symptoms. The treatment provided moderate relief.    No past medical history on file.  No past surgical history on file.  No family history on file.  History  Substance Use Topics  . Smoking status: Not on file  . Smokeless tobacco: Not on file  . Alcohol Use: Not on file      Review of Systems  Gastrointestinal: Positive for abdominal pain.  Genitourinary: Positive for flank pain.  All other systems reviewed and are negative.    Allergies  Review of patient's allergies indicates not on file.  Home Medications   Current Outpatient Rx  Name Route Sig Dispense Refill  . ASPIRIN 81 MG PO CHEW Oral Chew 81 mg by mouth daily.      [name] CLOPIDOGREL BISULFATE 75 MG PO TABS Oral Take 75 mg by mouth daily.      . INSULIN GLARGINE 100 UNIT/ML [place] SOLN Subcutaneous Inject 20 Units into the skin at bedtime.      [name] LISINOPRIL 40 MG PO TABS Oral Take 40 mg by mouth daily.      [name] METFORMIN HCL 1000 MG PO TABS Oral Take 1,000 mg by mouth 2 (two) times daily with a meal.      . METOPROLOL SUCCINATE ER 25 MG PO TB24 Oral Take 25 mg by mouth daily.      [name] PANTOPRAZOLE SODIUM 40 MG PO TBEC Oral Take 40 mg by mouth daily.      [name] PIOGLITAZONE HCL 30 MG PO TABS Oral Take 30 mg by mouth daily.      [name] SIMVASTATIN 20 MG PO TABS Oral Take 20 mg by mouth at bedtime.        There were no vitals taken for this  visit.  Physical Exam  Nursing note and vitals reviewed. Constitutional: He is oriented to person, place, and time. He appears well-developed and well-nourished.  HENT:  Head: Normocephalic and atraumatic.  Right Ear: External ear normal.  Left Ear: External ear normal.  Nose: Nose normal.  Mouth/Throat: Oropharynx is clear and moist.  Eyes: Conjunctivae and EOM are normal. Pupils are equal, round, and reactive to light.  Neck: Normal range of motion.  Cardiovascular: Normal rate.   Pulmonary/Chest: Effort normal.  Abdominal: Soft.  Musculoskeletal: Normal range of motion.  Neurological: He is alert and oriented to person, place, and time. He has normal reflexes.  Skin: Skin is warm.  Psychiatric: He has a normal mood and affect.    ED Course  Procedures (including critical care time)  Labs Reviewed - No data to display No results found.   No diagnosis found.    MDM    Pt to ED for evaluation  Pt having flank pain and vomitting     Lonia Skinner Th[place], [place] 04/01/12 2107

## 2012-04-02 ENCOUNTER — Emergency Department (HOSPITAL_COMMUNITY): Payer: BC Managed Care – PPO

## 2012-04-02 LAB — POCT I-STAT, CHEM 8
Creatinine, Ser: 1.3 mg/dL (ref 0.50–1.35)
HCT: 47 % (ref 39.0–52.0)
Hemoglobin: 16 g/dL (ref 13.0–17.0)
Potassium: 3.9 mEq/L (ref 3.5–5.1)
Sodium: 141 mEq/L (ref 135–145)
TCO2: 23 mmol/L (ref 0–100)

## 2012-04-02 LAB — URINALYSIS, ROUTINE W REFLEX MICROSCOPIC
Nitrite: NEGATIVE
Specific Gravity, Urine: 1.031 — ABNORMAL HIGH (ref 1.005–1.030)
pH: 5 (ref 5.0–8.0)

## 2012-04-02 LAB — CBC
Hemoglobin: 14.9 g/dL (ref 13.0–17.0)
MCH: 29.3 pg (ref 26.0–34.0)
RBC: 5.09 MIL/uL (ref 4.22–5.81)
WBC: 13.2 10*3/uL — ABNORMAL HIGH (ref 4.0–10.5)

## 2012-04-02 LAB — DIFFERENTIAL
Eosinophils Absolute: 0 10*3/uL (ref 0.0–0.7)
Lymphocytes Relative: 10 % — ABNORMAL LOW (ref 12–46)
Lymphs Abs: 1.4 10*3/uL (ref 0.7–4.0)
Monocytes Relative: 6 % (ref 3–12)
Neutrophils Relative %: 83 % — ABNORMAL HIGH (ref 43–77)

## 2012-04-02 LAB — URINE MICROSCOPIC-ADD ON

## 2012-04-02 MED ORDER — ONDANSETRON 8 MG PO TBDP: ORAL_TABLET | ORAL | Status: AC

## 2012-04-02 MED ORDER — SODIUM CHLORIDE 0.9 % IV BOLUS (SEPSIS)
500.0000 mL | Freq: Once | INTRAVENOUS | Status: AC
  Administered 2012-04-02: 500 mL via INTRAVENOUS

## 2012-04-02 MED ORDER — KETOROLAC TROMETHAMINE 30 MG/ML IJ SOLN
30.0000 mg | Freq: Once | INTRAMUSCULAR | Status: AC
  Administered 2012-04-02: 30 mg via INTRAVENOUS
  Filled 2012-04-02: qty 1

## 2012-04-02 MED ORDER — TAMSULOSIN HCL 0.4 MG PO CAPS: 0.4000 mg | ORAL_CAPSULE | Freq: Every day | ORAL | Status: DC

## 2012-04-02 MED ORDER — OXYCODONE-ACETAMINOPHEN 5-325 MG PO TABS: 1.0000 | ORAL_TABLET | Freq: Four times a day (QID) | ORAL | Status: DC | PRN

## 2012-04-02 MED ORDER — ONDANSETRON HCL 4 MG/2ML IJ SOLN
4.0000 mg | Freq: Once | INTRAMUSCULAR | Status: AC
  Administered 2012-04-02: 4 mg via INTRAVENOUS
  Filled 2012-04-02: qty 2

## 2012-04-02 NOTE — Discharge Instructions (Signed)

## 2012-04-02 NOTE — ED Provider Notes (Addendum)
History     CSN: [id number]  Arrival date & time 04/01/12  2136   First MD Initiated Contact with Patient 04/02/12 0006      Chief Complaint  Patient presents with  . Flank Pain    (Consider location/radiation/quality/duration/timing/severity/associated sxs/prior treatment) Patient is a 61 y.o. male presenting with flank pain. The history is provided by the patient. No language interpreter was used.  Flank Pain This is a recurrent problem. The current episode started more than 2 days ago. Episode frequency: episodically. The problem has been gradually worsening. Pertinent negatives include no chest pain, no abdominal pain, no headaches and no shortness of breath. The symptoms are aggravated by nothing. The symptoms are relieved by nothing. He has tried nothing for the symptoms. The treatment provided no relief.  Pain in left flank with gross hematuria since Monday.    Past Medical History  Diagnosis Date  . Diabetes mellitus   . Hypertension   . High cholesterol   . Renal disorder     kidney stone    Past Surgical History  Procedure Date  . Ureteral stent placement   . Carotid stent     2002 or 2003 pt unsure    History reviewed. No pertinent family history.  History  Substance Use Topics  . Smoking status: Never Smoker   . Smokeless tobacco: Not on file  . Alcohol Use: No      Review of Systems  Respiratory: Negative for shortness of breath.   Cardiovascular: Negative for chest pain.  Gastrointestinal: Positive for nausea. Negative for abdominal pain.  Genitourinary: Positive for hematuria and flank pain.  Neurological: Negative for headaches.  All other systems reviewed and are negative.    Allergies  Review of patient's allergies indicates no known allergies.  Home Medications   Current Outpatient Rx  Name Route Sig Dispense Refill  . ASPIRIN 81 MG PO CHEW Oral Chew 81 mg by mouth daily.      [name] CLOPIDOGREL BISULFATE 75 MG PO TABS Oral Take 75 mg by  mouth daily.      . INSULIN GLARGINE 100 UNIT/ML [place] SOLN Subcutaneous Inject 20 Units into the skin at bedtime.      [name] LISINOPRIL 40 MG PO TABS Oral Take 40 mg by mouth daily.      [name] METFORMIN HCL 1000 MG PO TABS Oral Take 1,000 mg by mouth 2 (two) times daily with a meal.      . METOPROLOL SUCCINATE ER 25 MG PO TB24 Oral Take 25 mg by mouth daily.      [name] PANTOPRAZOLE SODIUM 40 MG PO TBEC Oral Take 40 mg by mouth daily.      [name] PIOGLITAZONE HCL 30 MG PO TABS Oral Take 30 mg by mouth daily.      [name] SIMVASTATIN 20 MG PO TABS Oral Take 20 mg by mouth at bedtime.        BP 136/66  Pulse 89  Temp(Src) 98.8 F (37.1 C) (Oral)  Resp 16  SpO2 97%  Physical Exam  Vitals reviewed. Constitutional: He is oriented to person, place, and time. He appears well-developed and well-nourished.  HENT:  Head: Normocephalic and atraumatic.  Mouth/Throat: Oropharynx is clear and moist.  Eyes: Conjunctivae are normal. Pupils are equal, round, and reactive to light.  Neck: Normal range of motion. Neck supple.  Cardiovascular: Normal rate and regular rhythm.   Pulmonary/Chest: Effort normal and breath sounds normal.  Abdominal: Soft. Bowel sounds are normal. There is no  tenderness. There is no rebound and no guarding.  Musculoskeletal: Normal range of motion.  Neurological: He is alert and oriented to person, place, and time.  Skin: Skin is dry.  Psychiatric: He has a normal mood and affect.    ED Course  Procedures (including critical care time)  Labs Reviewed  CBC - Abnormal; Notable for the following:    WBC 13.2 (*)    All other components within normal limits  DIFFERENTIAL - Abnormal; Notable for the following:    Neutrophils Relative 83 (*)    Neutro Abs 11.0 (*)    Lymphocytes Relative 10 (*)    All other components within normal limits  URINALYSIS, ROUTINE W REFLEX MICROSCOPIC   No results found.   No diagnosis found.    MDM  Follow up with urology within 7 day return for any  worsening symptoms.  Patient verbalizes understanding and agrees to follow up   Patient informed of pulmonary nodule and need to follow up with chest CT within 6 months.  Patient and wife verbalize understanding and agree to follow up    Kayon Dozier K Nyasiah Moffet-Rasch, MD 04/02/12 0232  Chrstopher Malenfant K Jacalynn Buzzell-Rasch, MD 04/02/12 [phone]

## 2012-04-02 NOTE — ED Provider Notes (Signed)
Medical screening examination/treatment/procedure(s) were performed by non-physician practitioner and as supervising physician I was immediately available for consultation/collaboration.   MORENO-COLL,Dalvin Clipper; MD   Onika Gudiel Moreno-Coll, MD 04/02/12 1408 

## 2012-04-02 NOTE — ED Notes (Signed)
Rx x 3, pt voiced understanding to f/u with PCP in 7 days, strain urine, and get 6 month f/u CT

## 2012-04-04 ENCOUNTER — Other Ambulatory Visit: Payer: Self-pay | Admitting: Urology

## 2012-04-04 ENCOUNTER — Encounter (HOSPITAL_BASED_OUTPATIENT_CLINIC_OR_DEPARTMENT_OTHER): Payer: Self-pay | Admitting: Anesthesiology

## 2012-04-04 ENCOUNTER — Ambulatory Visit (HOSPITAL_BASED_OUTPATIENT_CLINIC_OR_DEPARTMENT_OTHER): Payer: BC Managed Care – PPO | Admitting: Anesthesiology

## 2012-04-04 ENCOUNTER — Encounter (HOSPITAL_BASED_OUTPATIENT_CLINIC_OR_DEPARTMENT_OTHER)
Admission: RE | Disposition: A | Discharge status: Home / Self Care | Payer: Self-pay | Source: Ambulatory Visit | Attending: Urology

## 2012-04-04 ENCOUNTER — Encounter (HOSPITAL_BASED_OUTPATIENT_CLINIC_OR_DEPARTMENT_OTHER): Payer: Self-pay | Admitting: *Deleted

## 2012-04-04 ENCOUNTER — Ambulatory Visit (HOSPITAL_BASED_OUTPATIENT_CLINIC_OR_DEPARTMENT_OTHER)
Admission: RE | Admit: 2012-04-04 | Discharge: 2012-04-04 | Disposition: A | Discharge status: Home / Self Care | Payer: BC Managed Care – PPO | Source: Ambulatory Visit | Attending: Urology | Admitting: Urology

## 2012-04-04 DIAGNOSIS — I2510 Atherosclerotic heart disease of native coronary artery without angina pectoris: Secondary | ICD-10-CM | POA: Insufficient documentation

## 2012-04-04 DIAGNOSIS — E119 Type 2 diabetes mellitus without complications: Secondary | ICD-10-CM | POA: Insufficient documentation

## 2012-04-04 DIAGNOSIS — I10 Essential (primary) hypertension: Secondary | ICD-10-CM | POA: Insufficient documentation

## 2012-04-04 DIAGNOSIS — N201 Calculus of ureter: Secondary | ICD-10-CM | POA: Insufficient documentation

## 2012-04-04 DIAGNOSIS — Z794 Long term (current) use of insulin: Secondary | ICD-10-CM | POA: Insufficient documentation

## 2012-04-04 DIAGNOSIS — Z7982 Long term (current) use of aspirin: Secondary | ICD-10-CM | POA: Insufficient documentation

## 2012-04-04 DIAGNOSIS — K219 Gastro-esophageal reflux disease without esophagitis: Secondary | ICD-10-CM | POA: Insufficient documentation

## 2012-04-04 DIAGNOSIS — N200 Calculus of kidney: Secondary | ICD-10-CM

## 2012-04-04 DIAGNOSIS — Z79899 Other long term (current) drug therapy: Secondary | ICD-10-CM | POA: Insufficient documentation

## 2012-04-04 HISTORY — DX: Gastro-esophageal reflux disease without esophagitis: K21.9

## 2012-04-04 HISTORY — DX: Unspecified osteoarthritis, unspecified site: M19.90

## 2012-04-04 HISTORY — PX: CYSTOSCOPY WITH STENT PLACEMENT: SHX5790

## 2012-04-04 SURGERY — CYSTOSCOPY, WITH STENT INSERTION
Anesthesia: General | Site: Ureter | Laterality: Left | Wound class: Clean Contaminated

## 2012-04-04 MED ORDER — PROMETHAZINE HCL 25 MG/ML IJ SOLN: 6.2500 mg | INTRAMUSCULAR | Status: DC | PRN

## 2012-04-04 MED ORDER — OXYCODONE HCL 5 MG PO TABS: 5.0000 mg | ORAL_TABLET | ORAL | Status: AC | PRN

## 2012-04-04 MED ORDER — IOHEXOL 350 MG/ML SOLN
INTRAVENOUS | Status: DC | PRN
  Administered 2012-04-04: 50 mL

## 2012-04-04 MED ORDER — KETOROLAC TROMETHAMINE 30 MG/ML IJ SOLN
INTRAMUSCULAR | Status: DC | PRN
  Administered 2012-04-04: 30 mg via INTRAVENOUS

## 2012-04-04 MED ORDER — LIDOCAINE HCL 2 % EX GEL
CUTANEOUS | Status: DC | PRN
  Administered 2012-04-04: 1

## 2012-04-04 MED ORDER — FENTANYL CITRATE 0.05 MG/ML IJ SOLN
INTRAMUSCULAR | Status: DC | PRN
  Administered 2012-04-04: 50 ug via INTRAVENOUS
  Administered 2012-04-04: 100 ug via INTRAVENOUS
  Administered 2012-04-04: 50 ug via INTRAVENOUS

## 2012-04-04 MED ORDER — METOCLOPRAMIDE HCL 5 MG/ML IJ SOLN
INTRAMUSCULAR | Status: DC | PRN
  Administered 2012-04-04: 10 mg via INTRAVENOUS

## 2012-04-04 MED ORDER — PROPOFOL 10 MG/ML IV EMUL
INTRAVENOUS | Status: DC | PRN
  Administered 2012-04-04: 300 mg via INTRAVENOUS

## 2012-04-04 MED ORDER — MIDAZOLAM HCL 5 MG/5ML IJ SOLN
INTRAMUSCULAR | Status: DC | PRN
  Administered 2012-04-04: 2 mg via INTRAVENOUS

## 2012-04-04 MED ORDER — KETOROLAC TROMETHAMINE 30 MG/ML IJ SOLN: 15.0000 mg | Freq: Once | INTRAMUSCULAR | Status: DC | PRN

## 2012-04-04 MED ORDER — ONDANSETRON HCL 4 MG/2ML IJ SOLN
INTRAMUSCULAR | Status: DC | PRN
  Administered 2012-04-04: 4 mg via INTRAVENOUS

## 2012-04-04 MED ORDER — LACTATED RINGERS IV SOLN
INTRAVENOUS | Status: DC | PRN
  Administered 2012-04-04: 14:00:00 via INTRAVENOUS

## 2012-04-04 MED ORDER — HYOSCYAMINE SULFATE 0.125 MG PO TABS: 0.1250 mg | ORAL_TABLET | ORAL | Status: DC | PRN

## 2012-04-04 MED ORDER — LACTATED RINGERS IV SOLN
INTRAVENOUS | Status: DC
  Administered 2012-04-04: 13:00:00 via INTRAVENOUS

## 2012-04-04 MED ORDER — LIDOCAINE HCL (CARDIAC) 20 MG/ML IV SOLN
INTRAVENOUS | Status: DC | PRN
  Administered 2012-04-04: 100 mg via INTRAVENOUS

## 2012-04-04 MED ORDER — SENNOSIDES-DOCUSATE SODIUM 8.6-50 MG PO TABS: 1.0000 | ORAL_TABLET | Freq: Two times a day (BID) | ORAL | Status: DC

## 2012-04-04 MED ORDER — SODIUM CHLORIDE 0.9 % IR SOLN
Status: DC | PRN
  Administered 2012-04-04: 3000 mL

## 2012-04-04 MED ORDER — GLYCOPYRROLATE 0.2 MG/ML IJ SOLN
INTRAMUSCULAR | Status: DC | PRN
  Administered 2012-04-04: 0.2 mg via INTRAVENOUS

## 2012-04-04 MED ORDER — CEFAZOLIN SODIUM 1-5 GM-% IV SOLN: 1.0000 g | INTRAVENOUS | Status: DC

## 2012-04-04 MED ORDER — PHENAZOPYRIDINE HCL 100 MG PO TABS: 100.0000 mg | ORAL_TABLET | Freq: Three times a day (TID) | ORAL | Status: AC | PRN

## 2012-04-04 MED ORDER — FENTANYL CITRATE 0.05 MG/ML IJ SOLN: 25.0000 ug | INTRAMUSCULAR | Status: DC | PRN

## 2012-04-04 SURGICAL SUPPLY — 24 items
ADAPTER CATH URET PLST 4-6FR (CATHETERS) ×2 IMPLANT
ADPR CATH URET STRL DISP 4-6FR (CATHETERS) ×2
BAG DRAIN URO-CYSTO SKYTR STRL (DRAIN) ×3 IMPLANT
BAG DRN UROCATH (DRAIN) ×2
CANISTER SUCT LVC 12 LTR MEDI- (MISCELLANEOUS) ×2 IMPLANT
CATH INTERMIT  6FR 70CM (CATHETERS) IMPLANT
CATH URET 5FR 28IN CONE TIP (BALLOONS) ×1
CATH URET 5FR 70CM CONE TIP (BALLOONS) ×1 IMPLANT
CLOTH BEACON ORANGE TIMEOUT ST (SAFETY) ×3 IMPLANT
DRAPE CAMERA CLOSED 9X96 (DRAPES) ×3 IMPLANT
GLOVE BIO SURGEON STRL SZ 6.5 (GLOVE) ×2 IMPLANT
GLOVE BIOGEL M STER SZ 6 (GLOVE) ×2 IMPLANT
GLOVE ECLIPSE 6.0 STRL STRAW (GLOVE) ×4 IMPLANT
GLOVE ECLIPSE 7.0 STRL STRAW (GLOVE) ×3 IMPLANT
GLOVE INDICATOR 7.5 STRL GRN (GLOVE) ×3 IMPLANT
GOWN PREVENTION PLUS LG XLONG (DISPOSABLE) ×3 IMPLANT
GOWN STRL REIN XL XLG (GOWN DISPOSABLE) ×3 IMPLANT
GUIDEWIRE 0.038 PTFE COATED (WIRE) IMPLANT
GUIDEWIRE ANG ZIPWIRE 038X150 (WIRE) IMPLANT
GUIDEWIRE STR DUAL SENSOR (WIRE) IMPLANT
IV NS IRRIG 3000ML ARTHROMATIC (IV SOLUTION) ×5 IMPLANT
NS IRRIG 500ML POUR BTL (IV SOLUTION) IMPLANT
PACK CYSTOSCOPY (CUSTOM PROCEDURE TRAY) ×3 IMPLANT
STENT URET 6FRX26 CONTOUR (STENTS) ×2 IMPLANT

## 2012-04-04 NOTE — H&P (Signed)
Urology History and Physical Exam  CC: Left UPJ stones.  HPI:  61 year old male reports to clinic today due to nephrolithiasis.  CT scan Apr 02, 2012 from the ER reveals 2 left renal stones. There located at the ureteropelvic junction. One measures approximately 3 mm and the other measures approximately 3.5 mm. There is associated pelvic caliectasis and mild perirenal stranding. There are no other stones located elsewhere in the urinary tract.  Today he complains of pain. It is located in the left flank. It is sharp in nature. It is associated with nausea and emesis. He has no fever. The pain radiates to his ipsilateral groin. It is improved with narcotics. He has been n.p.o. since yesterday.  Today we discussed the management of urinary stones. These options include observation, ureteroscopy, shockwave lithotripsy, and PCNL. We discussed which options are relevant to these particular stones. We discussed the natural history of stones as well as the complications of untreated stones and the impact on quality of life without treatment as well as with each of the above listed treatments. We also discussed the efficacy of each treatment in its ability to clear the stone burden. With any of these management options I discussed the signs and symptoms of infection and the need for emergent treatment should these be experienced. For each option we discussed the ability of each procedure to clear the patient of their stone burden. We also discussed risks, benefits, alternatives, and likelihood of achieving goals.  He has a history of cardiac stent placement. This was several years ago. He is currently on Plavix. He is managed by Dr. Mayford Knife at [facility] physicians. Today I advised a ureteral stent placement which would be a quick surgery and should relieve his nausea with an interval ureteroscopy in the future after he has had opportunity for clearance to discontinue his Plavix  PMH: Past Medical History    Diagnosis Date  . Diabetes mellitus   . Hypertension   . High cholesterol   . GERD (gastroesophageal reflux disease)   . Arthritis   . S/P primary angioplasty with coronary stent   . Left ureteral calculus   . Coronary artery disease LAST CARDIOLOGIST VISIT W/ DR Gloris [place] TURNER 3 YRS AGO    [name]    PSH: Past Surgical History  Procedure Date  . Left ureteroscopic stone extraction 03-14-2003    X2  . Cervical [place]oville foraminotomy w/ excision of herniated nuclec pulposus 2007    C6 - 7  . Cardiac catheterization JUNE 2003    PATENT LAD STENT/ BODERLINE OBSTRUCTIVE DISEASE POSTERIOR DESCENDING ARTERY/ NORMAL LVF  . Coronary angioplasty with stent placement 03-30-2006  DR EDMUNDS    90% LEFT CIRMCUMFLEX  --  DE STENT X1 /  95% POSTERIOR DESCENDING ARTERY--- DE X1 STENT/ PATENT LEFT ANTERIOR DESCENDING STENT/ LOW NORMAL LVF/  . Coronary angioplasty with stent placement JAN 2003--  DR TURNER    DE STENT TO LAD    Allergies: No Known Allergies  Medications: Prescriptions prior to admission  Medication Sig Dispense Refill  . aspirin 81 MG chewable tablet Chew 81 mg by mouth daily.        . clopidogrel (PLAVIX) 75 MG tablet Take 75 mg by mouth daily.        . insulin glargine (LANTUS SOLOSTAR) 100 UNIT/ML injection Inject 20 Units into the skin at bedtime.        [name] lisinopril (PRINIVIL,ZESTRIL) 40 MG tablet Take 40 mg  by mouth daily.        . metFORMIN (GLUCOPHAGE) 1000 MG tablet Take 1,000 mg by mouth 2 (two) times daily with a meal.        . metoprolol succinate (TOPROL-XL) 25 MG 24 hr tablet Take 25 mg by mouth daily.        . ondansetron (ZOFRAN ODT) 8 MG disintegrating tablet 8mg  ODT q4 hours prn nausea  8 tablet  0  . oxyCODONE-acetaminophen (PERCOCET) 5-325 MG per tablet Take 1 tablet by mouth every 6 (six) hours as needed for pain.  13 tablet  0  . pantoprazole (PROTONIX) 40 MG tablet Take 40 mg by mouth daily.        . pioglitazone  (ACTOS) 30 MG tablet Take 30 mg by mouth daily.        . simvastatin (ZOCOR) 20 MG tablet Take 20 mg by mouth at bedtime.        . Tamsulosin HCl (FLOMAX) 0.4 MG CAPS Take 1 capsule (0.4 mg total) by mouth daily.  7 capsule  0     Social History: History   Social History  . Marital Status: Married    Spouse Name: N/A    Number of Children: N/A  . Years of Education: N/A   Occupational History  . Not on file.   Social History Main Topics  . Smoking status: Never Smoker   . Smokeless tobacco: Never Used  . Alcohol Use: No  . Drug Use: No  . Sexually Active: Yes   Other Topics Concern  . Not on file   Social History Narrative  . No narrative on file    Family History: History reviewed. No pertinent family history.  Review of Systems: Positive: Nausea, emesis. Negative: Fever, chest pain, SOB.  A further 10 point review of systems was negative except what is listed in the HPI.  Physical Exam:  General: No acute distress.  Awake. Head:  Normocephalic.  Atraumatic. ENT:  EOMI.  Mucous membranes moist Neck:  Supple.  No lymphadenopathy. CV:  S1 present. S2 present. Regular rate. Pulmonary: Equal effort bilaterally.  Clear to auscultation bilaterally. Abdomen: Soft.  Non- tender to palpation. Skin:  Normal turgor.  No visible rash. Extremity: No gross deformity of bilateral upper extremities.  No gross deformity of    bilateral lower extremities. Neurologic: Alert. Appropriate mood.   Studies:  Recent Labs  Basename 04/02/12 0136 04/02/12 0015   HGB 16.0 14.9   WBC -- 13.2*   PLT -- 300    Recent Labs  Basename 04/02/12 0136   NA 141   K 3.9   CL 107   CO2 --   BUN 20   CREATININE 1.30   CALCIUM --   GFRNONAA --   GFRAA --     No results found for this basename: PT:2,INR:2,APTT:2 in the last 72 hours   No components found with this basename: ABG:2    Assessment:  Left UPJ stones.  Plan: To OR for cystoscopy and left ureter stent placement.

## 2012-04-04 NOTE — Anesthesia Preprocedure Evaluation (Addendum)
Anesthesia Evaluation  Patient identified by MRN, date of birth, ID band Patient awake  General Assessment Comment:Pt took plavix yesterday.  No meds today, npo overnight  Reviewed: Allergy & Precautions, H&P , NPO status , Patient's Chart, lab work & pertinent test results  Airway Mallampati: II TM Distance: <3 FB Neck ROM: Full    Dental No notable dental hx.    Pulmonary neg pulmonary ROS,  breath sounds clear to auscultation  Pulmonary exam normal       Cardiovascular hypertension, + angina + CAD and + Cardiac Stents negative cardio ROS  Rhythm:Regular Rate:Normal     Neuro/Psych negative neurological ROS  negative psych ROS   GI/Hepatic Neg liver ROS, GERD-  Medicated,  Endo/Other  Diabetes mellitus-, Type 2, Insulin Dependent and Oral Hypoglycemic AgentsMorbid obesity  Renal/GU negative Renal ROS  negative genitourinary   Musculoskeletal negative musculoskeletal ROS (+)   Abdominal   Peds negative pediatric ROS (+)  Hematology negative hematology ROS (+)   Anesthesia Other Findings   Reproductive/Obstetrics negative OB ROS                        Anesthesia Physical Anesthesia Plan  ASA: III  Anesthesia Plan: General   Post-op Pain Management:    Induction: Intravenous  Airway Management Planned: LMA and Oral ETT  Additional Equipment:   Intra-op Plan:   Post-operative Plan: Extubation in OR  Informed Consent: I have reviewed the patients History and Physical, chart, labs and discussed the procedure including the risks, benefits and alternatives for the proposed anesthesia with the patient or authorized representative who has indicated his/her understanding and acceptance.   Dental advisory given  Plan Discussed with: CRNA  Anesthesia Plan Comments:         Anesthesia Quick Evaluation

## 2012-04-04 NOTE — Discharge Instructions (Signed)
DISCHARGE INSTRUCTIONS FOR KIDNEY STONES OR URETERAL STENT  MEDICATIONS:   1. DO NOT RESUME YOUR ASPIRIN, or any other medicines like ibuprofen, motrin, excedrin, advil, aleve, vitamin E, fish oil as these can all cause bleeding x 7 days.  2. Resume all your other meds from home - except do not take any other pain meds that you may have at home.  ACTIVITY 1. No strenuous activity x 1week 2. No driving while on narcotic pain medications 3. Drink plenty of water 4. Continue to walk at home - you can still get blood clots when you are at home, so keep active, but don't over do it. 5. May return to work in 3 days.  BATHING 1. You can shower.    SIGNS/SYMPTOMS TO CALL: 1. Please call us if you have a fever greater than 101.5, uncontrolled  nausea/vomiting, uncontrolled pain, dizziness, unable to urinate, chest pain, shortness of breath, leg swelling, leg pain, redness around wound, drainage from wound, or any other concerns or questions.  You can reach [place] at [phone].  FOLLOW-UP You will be contacted with an appointment for surgery to remove your stone. Post Anesthesia Home Care Instructions  Activity: Get plenty of rest for the remainder of the day. A responsible adult should stay with you for 24 hours following the procedure.  For the next 24 hours, DO NOT: -Drive a car -Advertising copywriter -Drink alcoholic beverages -Take any medication unless instructed by your physician -Make any legal decisions or sign important papers.  Meals: Start with liquid foods such as gelatin or soup. Progress to regular foods as tolerated. Avoid greasy, spicy, heavy foods. If nausea and/or vomiting occur, drink only clear liquids until the nausea and/or vomiting subsides. Call your physician if vomiting continues.  Special Instructions/Symptoms: Your throat may feel dry or sore from the anesthesia or the breathing tube placed in your throat during surgery. If this causes discomfort, gargle with  warm salt water. The discomfort should disappear within 24 hours.  CYSTOSCOPY HOME CARE INSTRUCTIONS  Activity: Rest for the remainder of the day.  Do not drive or operate equipment today.  You may resume normal activities in one to two days as instructed by your physician.   Meals: Drink plenty of liquids and eat light foods such as gelatin or soup this evening.  You may return to a normal meal plan tomorrow.  Return to Work: You may return to work in one to two days or as instructed by your physician.  Special Instructions / Symptoms: Call your physician if any of these symptoms occur:   -persistent or heavy bleeding  -bleeding which continues after first few urination  -large blood clots that are difficult to pass  -urine stream diminishes or stops completely  -fever equal to or higher than 101 degrees Farenheit.  -cloudy urine with a strong, foul odor  -severe pain  Females should always wipe from front to back after elimination.  You may feel some burning pain when you urinate.  This should disappear with time.  Applying moist heat to the lower abdomen or a hot tub bath may help relieve the pain. \  Follow-Up / Date of Return Visit to Your Physician:  *** Call for an appointment to arrange follow-up.  Patient Signature:  ________________________________________________________  Nurse's Signature:  ________________________________________________________ 1.

## 2012-04-04 NOTE — Transfer of Care (Signed)
Immediate Anesthesia Transfer of Care Note  Patient: Erik Hancock  Procedure(s) Performed: Procedure(s) (LRB): CYSTOSCOPY WITH STENT PLACEMENT (Left)  Patient Location: PACU  Anesthesia Type: General  Level of Consciousness: awake, sedated, patient cooperative and responds to stimulation  Airway & Oxygen Therapy: Patient Spontanous Breathing and Patient connected to face mask oxygen  Post-op Assessment: Report given to PACU RN, Post -op Vital signs reviewed and stable and Patient moving all extremities  Post vital signs: Reviewed and stable  Complications: No apparent anesthesia complications

## 2012-04-04 NOTE — Progress Notes (Signed)
Dr. Margarita Grizzle paged and informed of urine out put 50cc's, 40cc's respectively.  2nd liter almost infused.  Drinking well, however only 12-13cc's via scanner.  Ok to d/c to home per dr. Margarita Grizzle.  Pt reminded to go to nearest er if he is unable to void.

## 2012-04-04 NOTE — Brief Op Note (Signed)
04/04/2012  2:30 PM  PATIENT:  Erik Hancock  61 y.o. male  PRE-OPERATIVE DIAGNOSIS:  LEFT UPJ STONE  POST-OPERATIVE DIAGNOSIS:  LEFT UPJ STONE  PROCEDURE:  Procedure(s) (LRB): CYSTOSCOPY WITH URETER STENT REPLACEMENT (Left)  SURGEON:  Surgeon(s) and Role:    * Milford Cage, MD - Primary  PHYSICIAN ASSISTANT:   ASSISTANTS: none   ANESTHESIA:   general  EBL:     BLOOD ADMINISTERED:none  DRAINS: none   LOCAL MEDICATIONS USED:  LIDOCAINE  and Amount: 10 ml urethral jelly.  SPECIMEN:  No Specimen  DISPOSITION OF SPECIMEN:  N/A  COUNTS:  YES  TOURNIQUET:  * No tourniquets in log *  DICTATION: .Other Dictation: Dictation Number [id number]  PLAN OF CARE: Discharge to home after PACU  PATIENT DISPOSITION:  PACU - hemodynamically stable.   Delay start of Pharmacological VTE agent (>24hrs) due to surgical blood loss or risk of bleeding: no

## 2012-04-05 NOTE — Op Note (Signed)
NAME:  Erik Hancock, Erik Hancock NO.:  [id number]  MEDICAL RECORD NO.:  [id number]  LOCATION:                               FACILITY:  [facility]  PHYSICIAN:  Natalia Leatherwood, MD    DATE OF BIRTH:  1950/11/25  DATE OF PROCEDURE:  04/04/2012 DATE OF DISCHARGE:                              OPERATIVE REPORT   SURGEON:  Natalia Leatherwood, MD  ASSISTANT:  None.  PREOPERATIVE DIAGNOSIS:  Left ureteropelvic junction stone.  POSTOPERATIVE DIAGNOSIS:  Left ureteropelvic junction stone.  PROCEDURES PERFORMED: 1. Cystoscopy. 2. Left ureteral stent placement. 3. Fluoroscopy with interpretation less than 1 hour.  COMPLICATIONS:  None.  ESTIMATED BLOOD LOSS:  None.  DRAINS:  None.  FINDINGS:  Stones difficult to visualize on fluoroscopy.  Ease of placement with left ureteral stent.  SPECIMEN:  None.  HISTORY OF PRESENT ILLNESS:  This is a 61 year old gentleman, who presented to my clinic today with left flank pain.  He had been seen in the ER previously and found to have a left UPJ stones.  He is on Plavix and was having intractable retching.  I explained to him that I will be willing to place the ureteral stent, but I did not think it would be appropriate to perform lithotripsy with the laser while he is currently taking Plavix.  He elected to proceed with cystoscopy with left ureteral stent placement with an interval ureteroscopy after he is able to be cleared by his cardiologist for more extensive procedure.  DESCRIPTION OF PROCEDURE:  Informed was obtained, the patient was taken to the operating room where he was placed supine position.  IV antibiotics were infused and general anesthesia was induced.  He was then placed in a dorsal lithotomy position making sure to pad all pertinent neurovascular pressure points appropriately.  His genitals were then prepped and draped in usual fashion.  Time-out was performed, which showed the correct patient, surgical site, and  procedure identified and agreed upon by the team.  Following this, the patient's genitals were prepped and draped in sterile fashion.  The 12-degree cystoscope was advanced through the urethra into the bladder.  The left ureteral orifice was then visualized and it was cannulated with a sensor- tip wire.  The wire was placed up into the left renal pelvis on fluoroscopy.  A 6 x 26 double-J ureteral stent was placed without the strings up the wire with ease.  This gave a good curl on the left renal pelvis and a curl in the bladder.  I did attempt to shoot a retrograde pyelogram around the stent, but this was unsuccessful.  After this was done, his bladder was emptied and 10 mL of lidocaine jelly was placed into his urethra.  He has placed back in supine position. Anesthesia was reversed.  He was taken to PACU in stable condition.  He will follow up for an interval ureteroscopy after he has been cleared by the cardiologist.          ______________________________ Natalia Leatherwood, MD     DW/MEDQ  D:  04/04/2012  T:  04/05/2012  Job:  [id number]

## 2012-04-05 NOTE — Anesthesia Postprocedure Evaluation (Signed)
Anesthesia Post Note  Patient: Erik Hancock  Procedure(s) Performed: Procedure(s) (LRB): CYSTOSCOPY WITH STENT PLACEMENT (Left)  Anesthesia type: General  Patient location: PACU  Post pain: Pain level controlled  Post assessment: Post-op Vital signs reviewed  Last Vitals:  Filed Vitals:   04/04/12 1730  BP: 140/80  Pulse: 69  Temp: 36.6 C  Resp: 16    Post vital signs: Reviewed  Level of consciousness: sedated  Complications: No apparent anesthesia complications

## 2012-04-08 ENCOUNTER — Other Ambulatory Visit: Payer: Self-pay | Admitting: Family Medicine

## 2012-04-08 DIAGNOSIS — R918 Other nonspecific abnormal finding of lung field: Secondary | ICD-10-CM

## 2012-04-13 ENCOUNTER — Ambulatory Visit
Admission: RE | Admit: 2012-04-13 | Discharge: 2012-04-13 | Disposition: A | Discharge status: Home / Self Care | Payer: BC Managed Care – PPO | Source: Ambulatory Visit | Attending: Family Medicine | Admitting: Family Medicine

## 2012-04-13 DIAGNOSIS — R918 Other nonspecific abnormal finding of lung field: Secondary | ICD-10-CM

## 2012-04-25 ENCOUNTER — Other Ambulatory Visit: Payer: Self-pay | Admitting: Urology

## 2012-04-28 ENCOUNTER — Encounter (HOSPITAL_COMMUNITY): Payer: Self-pay

## 2012-04-28 ENCOUNTER — Encounter (HOSPITAL_COMMUNITY): Payer: Self-pay | Admitting: Pharmacy Technician

## 2012-04-28 ENCOUNTER — Encounter (HOSPITAL_COMMUNITY)
Admission: RE | Admit: 2012-04-28 | Discharge: 2012-04-28 | Disposition: A | Discharge status: Home / Self Care | Payer: BC Managed Care – PPO | Source: Ambulatory Visit | Attending: Urology | Admitting: Urology

## 2012-04-28 HISTORY — DX: Sleep apnea, unspecified: G47.30

## 2012-04-28 LAB — BASIC METABOLIC PANEL
BUN: 20 mg/dL (ref 6–23)
CO2: 23 mEq/L (ref 19–32)
Chloride: 100 mEq/L (ref 96–112)
Creatinine, Ser: 0.97 mg/dL (ref 0.50–1.35)
Sodium: 136 mEq/L (ref 135–145)

## 2012-04-28 LAB — CBC
Hemoglobin: 13.6 g/dL (ref 13.0–17.0)
MCH: 29 pg (ref 26.0–34.0)
MCHC: 33 g/dL (ref 30.0–36.0)
Platelets: 294 10*3/uL (ref 150–400)
RDW: 14 % (ref 11.5–15.5)

## 2012-04-28 LAB — SURGICAL PCR SCREEN: MRSA, PCR: NEGATIVE

## 2012-04-28 MED ORDER — CEFAZOLIN SODIUM 1-5 GM-% IV SOLN
1.0000 g | INTRAVENOUS | Status: DC
Start: 2012-04-28 — End: 2012-04-28

## 2012-04-28 NOTE — Patient Instructions (Addendum)
20 Erik Hancock  04/28/2012   Your procedure is scheduled on:  05-04-2012  Report to [facility] at 1200  PM.  Call this number if you have problems the morning of surgery: [phone]   Remember: take 1/2 dose bedtime lantus insulin night before surgery, take 12 1/2 units    Do not eat food :After Midnight.  .clear liquids midnight until 0825 am, then nothing by mouth  Take these medicines the morning of surgery with A SIP OF WATER: metoprolol, oxycodone if needed, pantaprazole   Do not wear jewelry or make up.  Do not wear lotions, powders, or perfumes.Do not wear deodorant.    Do not bring valuables to the hospital.  Contacts, dentures or bridgework may not be worn into surgery.  Leave suitcase in the car. After surgery it may be brought to your room.  For patients admitted to the hospital, checkout time is 11:00 AM the day of discharge.     Special Instructions: CHG Shower Use Special Wash: 1/2 bottle night before surgery and 1/2 bottle morning of surgery.neck down avoid private area   Please read over the following fact sheets that you were given: MRSA Information  [facility] pre op nurse phone number [phone], call if needed

## 2012-04-28 NOTE — Pre-Procedure Instructions (Addendum)
Stress test 04-20-2012 [facility] on chart Cardiac clearance note 04-08-2012 dr [place] [facility] cardiology on chart ekg 04-04-2012 epic and on chart Chest ct 04-13-2012 epic Chest ct 04-13-2012 results faxed to dr Margarita Grizzle, fax confirmation received and placed on chart

## 2012-04-28 NOTE — Progress Notes (Signed)
04/28/12 1105  OBSTRUCTIVE SLEEP APNEA  Have you ever been diagnosed with sleep apnea through a sleep study? No  Do you snore loudly (loud enough to be heard through closed doors)?  1  Do you often feel tired, fatigued, or sleepy during the daytime? 0  Has anyone observed you stop breathing during your sleep? 0  Do you have, or are you being treated for high blood pressure? 1  BMI more than 35 kg/m2? 1  Age over 60 years old? 1  Neck circumference greater than 40 cm/18 inches? 0  Gender: 1  Obstructive Sleep Apnea Score 5   Score 4 or greater  Updated health history;Results sent to PCP

## 2012-05-04 ENCOUNTER — Encounter (HOSPITAL_COMMUNITY): Payer: Self-pay | Admitting: *Deleted

## 2012-05-04 ENCOUNTER — Encounter (HOSPITAL_COMMUNITY): Payer: Self-pay | Admitting: Anesthesiology

## 2012-05-04 ENCOUNTER — Encounter (HOSPITAL_COMMUNITY)
Admission: RE | Disposition: A | Discharge status: Home / Self Care | Payer: Self-pay | Source: Ambulatory Visit | Attending: Urology

## 2012-05-04 ENCOUNTER — Ambulatory Visit (HOSPITAL_COMMUNITY): Payer: BC Managed Care – PPO | Admitting: Anesthesiology

## 2012-05-04 ENCOUNTER — Ambulatory Visit (HOSPITAL_COMMUNITY)
Admission: RE | Admit: 2012-05-04 | Discharge: 2012-05-04 | Disposition: A | Discharge status: Home / Self Care | Payer: BC Managed Care – PPO | Source: Ambulatory Visit | Attending: Urology | Admitting: Urology

## 2012-05-04 DIAGNOSIS — G4730 Sleep apnea, unspecified: Secondary | ICD-10-CM | POA: Insufficient documentation

## 2012-05-04 DIAGNOSIS — I10 Essential (primary) hypertension: Secondary | ICD-10-CM | POA: Insufficient documentation

## 2012-05-04 DIAGNOSIS — Z01812 Encounter for preprocedural laboratory examination: Secondary | ICD-10-CM | POA: Insufficient documentation

## 2012-05-04 DIAGNOSIS — K219 Gastro-esophageal reflux disease without esophagitis: Secondary | ICD-10-CM | POA: Insufficient documentation

## 2012-05-04 DIAGNOSIS — I2510 Atherosclerotic heart disease of native coronary artery without angina pectoris: Secondary | ICD-10-CM | POA: Insufficient documentation

## 2012-05-04 DIAGNOSIS — E119 Type 2 diabetes mellitus without complications: Secondary | ICD-10-CM | POA: Insufficient documentation

## 2012-05-04 DIAGNOSIS — N200 Calculus of kidney: Secondary | ICD-10-CM | POA: Insufficient documentation

## 2012-05-04 HISTORY — PX: CYSTOSCOPY WITH URETEROSCOPY: SHX5123

## 2012-05-04 HISTORY — PX: CYSTOSCOPY W/ RETROGRADES: SHX1426

## 2012-05-04 HISTORY — PX: CYSTOSCOPY W/ URETERAL STENT PLACEMENT: SHX1429

## 2012-05-04 LAB — GLUCOSE, CAPILLARY: Glucose-Capillary: 138 mg/dL — ABNORMAL HIGH (ref 70–99)

## 2012-05-04 SURGERY — CYSTOSCOPY WITH URETEROSCOPY
Anesthesia: General | Laterality: Left | Wound class: Clean Contaminated

## 2012-05-04 MED ORDER — BELLADONNA ALKALOIDS-OPIUM 16.2-60 MG RE SUPP
RECTAL | Status: AC
  Filled 2012-05-04: qty 1

## 2012-05-04 MED ORDER — LACTATED RINGERS IV SOLN
INTRAVENOUS | Status: DC
  Administered 2012-05-04: 1000 mL via INTRAVENOUS
  Administered 2012-05-04 (×2): via INTRAVENOUS

## 2012-05-04 MED ORDER — ONDANSETRON HCL 4 MG/2ML IJ SOLN
INTRAMUSCULAR | Status: DC | PRN
  Administered 2012-05-04: 4 mg via INTRAVENOUS

## 2012-05-04 MED ORDER — CIPROFLOXACIN HCL 500 MG PO TABS: 500.0000 mg | ORAL_TABLET | Freq: Two times a day (BID) | ORAL | Status: AC

## 2012-05-04 MED ORDER — HYOSCYAMINE SULFATE 0.125 MG PO TABS: 0.1250 mg | ORAL_TABLET | ORAL | Status: DC | PRN

## 2012-05-04 MED ORDER — PROMETHAZINE HCL 25 MG/ML IJ SOLN: 6.2500 mg | INTRAMUSCULAR | Status: DC | PRN

## 2012-05-04 MED ORDER — IOHEXOL 300 MG/ML  SOLN
INTRAMUSCULAR | Status: AC
  Filled 2012-05-04: qty 1

## 2012-05-04 MED ORDER — PHENYLEPHRINE HCL 10 MG/ML IJ SOLN
INTRAMUSCULAR | Status: DC | PRN
  Administered 2012-05-04: 80 ug via INTRAVENOUS

## 2012-05-04 MED ORDER — SODIUM CHLORIDE 0.9 % IR SOLN
Status: DC | PRN
  Administered 2012-05-04: 1000 mL

## 2012-05-04 MED ORDER — PHENAZOPYRIDINE HCL 100 MG PO TABS: 100.0000 mg | ORAL_TABLET | Freq: Three times a day (TID) | ORAL | Status: DC | PRN

## 2012-05-04 MED ORDER — FENTANYL CITRATE 0.05 MG/ML IJ SOLN
25.0000 ug | INTRAMUSCULAR | Status: DC | PRN
  Administered 2012-05-04 (×2): 50 ug via INTRAVENOUS

## 2012-05-04 MED ORDER — FENTANYL CITRATE 0.05 MG/ML IJ SOLN
INTRAMUSCULAR | Status: AC
  Filled 2012-05-04: qty 2

## 2012-05-04 MED ORDER — OXYBUTYNIN CHLORIDE 5 MG PO TABS: 5.0000 mg | ORAL_TABLET | Freq: Four times a day (QID) | ORAL | Status: DC | PRN

## 2012-05-04 MED ORDER — EPHEDRINE SULFATE 50 MG/ML IJ SOLN
INTRAMUSCULAR | Status: DC | PRN
  Administered 2012-05-04: 50 mg via INTRAVENOUS

## 2012-05-04 MED ORDER — LIDOCAINE HCL (CARDIAC) 10 MG/ML IV SOLN
INTRAVENOUS | Status: DC | PRN
  Administered 2012-05-04: 100 mg via INTRAVENOUS

## 2012-05-04 MED ORDER — CEFAZOLIN SODIUM-DEXTROSE 2-3 GM-% IV SOLR
INTRAVENOUS | Status: AC
  Filled 2012-05-04: qty 50

## 2012-05-04 MED ORDER — BELLADONNA ALKALOIDS-OPIUM 16.2-60 MG RE SUPP
RECTAL | Status: DC | PRN
  Administered 2012-05-04: 1 via RECTAL

## 2012-05-04 MED ORDER — CEFAZOLIN SODIUM-DEXTROSE 2-3 GM-% IV SOLR
2.0000 g | Freq: Once | INTRAVENOUS | Status: AC
  Administered 2012-05-04: 2 g via INTRAVENOUS

## 2012-05-04 MED ORDER — SENNOSIDES-DOCUSATE SODIUM 8.6-50 MG PO TABS: 1.0000 | ORAL_TABLET | Freq: Two times a day (BID) | ORAL | Status: AC

## 2012-05-04 MED ORDER — IOHEXOL 300 MG/ML  SOLN
INTRAMUSCULAR | Status: DC | PRN
  Administered 2012-05-04: 10 mL

## 2012-05-04 MED ORDER — FENTANYL CITRATE 0.05 MG/ML IJ SOLN
INTRAMUSCULAR | Status: DC | PRN
  Administered 2012-05-04: 100 ug via INTRAVENOUS
  Administered 2012-05-04 (×2): 50 ug via INTRAVENOUS
  Administered 2012-05-04 (×2): 25 ug via INTRAVENOUS
  Administered 2012-05-04: 50 ug via INTRAVENOUS

## 2012-05-04 MED ORDER — LIDOCAINE HCL 2 % EX GEL
CUTANEOUS | Status: AC
  Filled 2012-05-04: qty 10

## 2012-05-04 MED ORDER — LACTATED RINGERS IV SOLN: INTRAVENOUS | Status: DC

## 2012-05-04 MED ORDER — OXYCODONE HCL 5 MG PO TABS: 5.0000 mg | ORAL_TABLET | ORAL | Status: DC | PRN

## 2012-05-04 MED ORDER — SODIUM CHLORIDE 0.9 % IR SOLN
Status: DC | PRN
  Administered 2012-05-04: 3000 mL

## 2012-05-04 MED ORDER — PROPOFOL 10 MG/ML IV BOLUS
INTRAVENOUS | Status: DC | PRN
  Administered 2012-05-04: 200 mg via INTRAVENOUS

## 2012-05-04 MED ORDER — MIDAZOLAM HCL 5 MG/5ML IJ SOLN
INTRAMUSCULAR | Status: DC | PRN
  Administered 2012-05-04: 2 mg via INTRAVENOUS

## 2012-05-04 SURGICAL SUPPLY — 26 items
ADAPTER CATH URET PLST 4-6FR (CATHETERS) ×2 IMPLANT
ADPR CATH URET STRL DISP 4-6FR (CATHETERS) ×1
BAG URO CATCHER STRL LF (DRAPE) ×2 IMPLANT
BASKET ZERO TIP NITINOL 2.4FR (BASKET) ×1 IMPLANT
BSKT STON RTRVL ZERO TP 2.4FR (BASKET) ×1
CATH INTERMIT  6FR 70CM (CATHETERS) ×1 IMPLANT
CATH ROBINSON RED A/P 20FR (CATHETERS) ×1 IMPLANT
CATH URET 5FR 28IN OPEN ENDED (CATHETERS) ×2 IMPLANT
CLOTH BEACON ORANGE TIMEOUT ST (SAFETY) ×2 IMPLANT
DRAPE CAMERA CLOSED 9X96 (DRAPES) ×2 IMPLANT
GLOVE BIOGEL PI IND STRL 7.5 (GLOVE) ×1 IMPLANT
GLOVE BIOGEL PI INDICATOR 7.5 (GLOVE) ×1
GLOVE ECLIPSE 7.5 STRL STRAW (GLOVE) ×2 IMPLANT
GLOVE SURG SS PI 8.0 STRL IVOR (GLOVE) ×2 IMPLANT
GOWN PREVENTION PLUS XLARGE (GOWN DISPOSABLE) ×2 IMPLANT
GOWN STRL NON-REIN LRG LVL3 (GOWN DISPOSABLE) ×2 IMPLANT
GOWN STRL REIN XL XLG (GOWN DISPOSABLE) ×2 IMPLANT
GUIDEWIRE ANG ZIPWIRE 038X150 (WIRE) IMPLANT
GUIDEWIRE STR DUAL SENSOR (WIRE) ×3 IMPLANT
LASER FIBER DISP (UROLOGICAL SUPPLIES) ×1 IMPLANT
MANIFOLD NEPTUNE II (INSTRUMENTS) ×2 IMPLANT
MARKER SKIN DUAL TIP RULER LAB (MISCELLANEOUS) ×1 IMPLANT
PACK CYSTO (CUSTOM PROCEDURE TRAY) ×2 IMPLANT
SHEATH ACCESS URETERAL 38CM (SHEATH) ×1 IMPLANT
TUBING CONNECTING 10 (TUBING) ×2 IMPLANT
WIRE COONS/BENSON .038X145CM (WIRE) ×1 IMPLANT

## 2012-05-04 NOTE — Brief Op Note (Addendum)
05/04/2012  5:25 PM  PATIENT:  Erik Hancock  61 y.o. male  PRE-OPERATIVE DIAGNOSIS:  LEFT URETEROPELVIC STONES  POST-OPERATIVE DIAGNOSIS:  LEFT URETEROPELVIC STONES  PROCEDURE:  Procedure(s) (LRB): CYSTOSCOPY WITH URETEROSCOPY (Left) CYSTOSCOPY WITH STENT REPLACEMENT (Left) HOLMIUM LASER APPLICATION (Left) CYSTOSCOPY WITH RETROGRADE PYELOGRAM (Left)  SURGEON:  Surgeon(s) and Role:    * Milford Cage, MD - Primary  PHYSICIAN ASSISTANT:   ASSISTANTS: none   ANESTHESIA:   general  EBL: Minimal  Total I/O In: 1000 [I.V.:1000] Out: -   BLOOD ADMINISTERED:none  DRAINS: none   LOCAL MEDICATIONS USED:  NONE  SPECIMEN:  Source of Specimen:  left kidney  DISPOSITION OF SPECIMEN:  Stone sent for chemical analysis at [facility] lab.  COUNTS:  YES  TOURNIQUET:  * No tourniquets in log *  DICTATION: .Other Dictation: Dictation Number [id number]  PLAN OF CARE: Discharge to home after PACU  PATIENT DISPOSITION:  PACU - hemodynamically stable.   Delay start of Pharmacological VTE agent (>24hrs) due to surgical blood loss or risk of bleeding: yes

## 2012-05-04 NOTE — Discharge Instructions (Signed)
DISCHARGE INSTRUCTIONS FOR KIDNEY STONES OR URETERAL STENT  MEDICATIONS:   1. You may resume your plavix and aspirin Monday, 05/09/12.    ACTIVITY 1. No strenuous activity x 1week 2. No driving while on narcotic pain medications 3. Drink plenty of water 4. Continue to walk at home - you can still get blood clots when you are at home, so keep active, but don't over do it. 5. May return to work in 3 days.  BATHING 1. You can shower. 2. If you have a string coming from your urethra:  The stent string is attached to your ureteral stent.  Do not pull on this.  If the stent gets pulled our partially before it is time to remove it, go ahead and remove the entire stent.  Call if you develop significant pain that lasts more than an hour.    SIGNS/SYMPTOMS TO CALL: 1. Please call us if you have a fever greater than 101.5, uncontrolled  nausea/vomiting, uncontrolled pain, dizziness, unable to urinate, chest pain, shortness of breath, leg swelling, leg pain, redness around wound, drainage from wound, or any other concerns or questions.  You can reach [place] at [phone].  FOLLOW-UP 1. If you have a string attached to your stent, you may remove it early in the morning on Monday, 05/09/12 before you come to clinic.  To do this, pull the string until the stent is completely removed.  You may feel an odd sensation in your back.

## 2012-05-04 NOTE — Anesthesia Postprocedure Evaluation (Signed)
  Anesthesia Post-op Note  Patient: Erik Hancock  Procedure(s) Performed: Procedure(s) (LRB): CYSTOSCOPY WITH URETEROSCOPY (Left) CYSTOSCOPY WITH STENT REPLACEMENT (Left) HOLMIUM LASER APPLICATION (Left) CYSTOSCOPY WITH RETROGRADE PYELOGRAM (Left)  Patient Location: PACU  Anesthesia Type: General  Level of Consciousness: awake and alert   Airway and Oxygen Therapy: Patient Spontanous Breathing  Post-op Pain: mild  Post-op Assessment: Post-op Vital signs reviewed, Patient's Cardiovascular Status Stable, Respiratory Function Stable, Patent Airway and No signs of Nausea or vomiting  Post-op Vital Signs: stable  Complications: No apparent anesthesia complications

## 2012-05-04 NOTE — Transfer of Care (Signed)
Immediate Anesthesia Transfer of Care Note  Patient: [name]  Procedure(s) Performed: Procedure(s) (LRB): CYSTOSCOPY WITH URETEROSCOPY (Left) CYSTOSCOPY WITH STENT REPLACEMENT (Left) HOLMIUM LASER APPLICATION (Left) CYSTOSCOPY WITH RETROGRADE PYELOGRAM (Left)  Patient Location: PACU  Anesthesia Type: General  Level of Consciousness: awake, alert , oriented and patient cooperative  Airway & Oxygen Therapy: Patient Spontanous Breathing and Patient connected to face mask oxygen  Post-op Assessment: Report given to PACU RN, Post -op Vital signs reviewed and stable and Patient moving all extremities X 4  Post vital signs: stable  Complications: No apparent anesthesia complications

## 2012-05-04 NOTE — H&P (Signed)
Urology History and Physical Exam  CC: Left nephrolithiasis.  HPI:    61 year old male with left nephrolithiasis. He presented to the ER 04/02/12 with left flank pain. CT scan revealed left UPJ stones of 3 mm and 3.5 mm in size. He had a left ureter stent placed on 04/04/12.  He returns today for cystoscopy, left ureteroscopy, laser lithotripsy, and left ureter stent exchange.  We have discussed the risks, benefits, alternatives, and likelihood of achieving his goals.  He has a history of cardiac stent placement and has been on Plavix. He was cleared by Dr. Armanda Magic at [place] to hold plavix & aspirin prior to surgery and stable from a cardiac point of view.     PMH: Past Medical History  Diagnosis Date  . Diabetes mellitus   . Hypertension   . High cholesterol   . GERD (gastroesophageal reflux disease)   . Arthritis   . S/P primary angioplasty with coronary stent   . Left ureteral calculus   . Coronary artery disease [name]    [name]  . Sleep apnea     stopbang=5    PSH: Past Surgical History  Procedure Date  . Left ureteroscopic stone extraction 03-14-2003    X2  . Cervical [place] foraminotomy w/ excision of herniated nuclec pulposus 2009    C6 - 7  . Cardiac catheterization JUNE 2003    PATENT LAD STENT/ BODERLINE OBSTRUCTIVE DISEASE POSTERIOR DESCENDING ARTERY/ NORMAL LVF  . Coronary angioplasty with stent placement 03-30-2006  DR EDMUNDS    90% LEFT CIRMCUMFLEX  --  DE STENT X1 /  95% POSTERIOR DESCENDING ARTERY--- DE X1 STENT/ PATENT LEFT ANTERIOR DESCENDING STENT/ LOW NORMAL LVF/  . Coronary angioplasty with stent placement JAN 2003--  DR TURNER    DE STENT TO LAD  . Cystoscopy with left stent placement 04-04-2012    Allergies: No Known Allergies  Medications: No prescriptions prior to admission     Social History: History   Social History  . Marital Status: Married      Spouse Name: N/A    Number of Children: N/A  . Years of Education: N/A   Occupational History  . Not on file.   Social History Main Topics  . Smoking status: Never Smoker   . Smokeless tobacco: Never Used  . Alcohol Use: No  . Drug Use: No  . Sexually Active: Yes   Other Topics Concern  . Not on file   Social History Narrative  . No narrative on file    Family History: No family history on file.  Review of Systems: Positive: Dysuria Negative: Chest pain, SOB, fever.  A further 10 point review of systems was negative except what is listed in the HPI.  Physical Exam:  General: No acute distress.  Awake. Head:  Normocephalic.  Atraumatic. ENT:  EOMI.  Mucous membranes moist Neck:  Supple.  No lymphadenopathy. CV:  S1 present. S2 present. Regular rate. Pulmonary: Equal effort bilaterally.  Clear to auscultation bilaterally. Abdomen: Soft.  Non- tender to palpation. Skin:  Normal turgor.  No visible rash. Extremity: No gross deformity of bilateral upper extremities.  No gross deformity of    bilateral lower extremities. Neurologic: Alert. Appropriate mood.   Studies:  No results found for this basename: HGB:2,WBC:2,PLT:2 in the last 72 hours  No results found for this basename: NA:2,K:2,CL:2,CO2:2,BUN:2,CREATININE:2,CALCIUM:2,MAGNESIUM:2,GFRNONAA:2,GFRAA:2 in the last 72 hours  No results found for this basename: PT:2,INR:2,APTT:2 in the last 72 hours   No components found with this basename: ABG:2    Assessment:  Left nephrolithiasis.  Plan: To OR for cystoscopy, left ureteroscopy, laser lithotripsy, and left ureter stent exchange.

## 2012-05-04 NOTE — Anesthesia Preprocedure Evaluation (Addendum)
Anesthesia Evaluation  Patient identified by MRN, date of birth, ID band Patient awake  General Assessment Comment:Pt took plavix yesterday.  No meds today, npo overnight  Reviewed: Allergy & Precautions, H&P , NPO status , Patient's Chart, lab work & pertinent test results, reviewed documented beta blocker date and time   History of Anesthesia Complications Negative for: history of anesthetic complications  Airway Mallampati: II TM Distance: <3 FB Neck ROM: Full    Dental No notable dental hx. (+) Teeth Intact and Dental Advisory Given   Pulmonary neg pulmonary ROS, sleep apnea (STOP BANG 5) ,  breath sounds clear to auscultation  Pulmonary exam normal       Cardiovascular hypertension, - angina+ CAD and + Cardiac Stents Rhythm:Regular Rate:Normal     Neuro/Psych negative neurological ROS  negative psych ROS   GI/Hepatic Neg liver ROS, GERD-  Medicated,  Endo/Other  Diabetes mellitus-, Type 2, Insulin Dependent and Oral Hypoglycemic AgentsMorbid obesity  Renal/GU negative Renal ROS  negative genitourinary   Musculoskeletal negative musculoskeletal ROS (+)   Abdominal (+) + obese,   Peds negative pediatric ROS (+)  Hematology negative hematology ROS (+)   Anesthesia Other Findings   Reproductive/Obstetrics negative OB ROS                          Anesthesia Physical Anesthesia Plan  ASA: III  Anesthesia Plan: General   Post-op Pain Management:    Induction: Intravenous  Airway Management Planned: LMA  Additional Equipment:   Intra-op Plan:   Post-operative Plan: Extubation in OR  Informed Consent: I have reviewed the patients History and Physical, chart, labs and discussed the procedure including the risks, benefits and alternatives for the proposed anesthesia with the patient or authorized representative who has indicated his/her understanding and acceptance.   Dental advisory  given  Plan Discussed with: CRNA and Surgeon  Anesthesia Plan Comments:        Anesthesia Quick Evaluation

## 2012-05-05 NOTE — Op Note (Signed)
Erik Hancock, Erik Hancock NO.:  [id number]  MEDICAL RECORD NO.:  [id number]  LOCATION:  [facility]                         FACILITY:  [facility]  PHYSICIAN:  Natalia Leatherwood, MD    DATE OF BIRTH:  03-28-1951  DATE OF PROCEDURE:  05/04/2012 DATE OF DISCHARGE:  05/04/2012                              OPERATIVE REPORT   SURGEON:  Natalia Leatherwood, MD.  ASSISTANT:  None.  PREOPERATIVE DIAGNOSIS:  Left nephrolithiasis.  POSTOPERATIVE DIAGNOSIS:  Left nephrolithiasis.  PROCEDURES PERFORMED: 1. Cystoscopy. 2. Left ureteroscopy. 3. Laser lithotripsy. 4. Basket stone retrieval. 5. Left retrograde pyelogram. 6. Left ureteral stent removal. 7. Left ureteral stent placement.  ESTIMATED BLOOD LOSS:  Minimal.  COMPLICATIONS:  None.  DRAINS:  None.  FINDINGS:  Two renal stones with proximal ureteral edema.  SPECIMEN:  Stones provided to patient's family.  HISTORY OF PRESENT ILLNESS:  This is a 61 year old male who presented with left obstructing stones at the ureteropelvic junction.  He had a ureteral stent placed in May 2013.  He returns today for ureteroscopy.  PROCEDURE:  Informed consent was obtained.  The patient was taken to the operating room where he was placed in a supine position.  PAS hose were in place and turned on.  IV antibiotics were infused and general anesthesia was induced.  He was then placed in a dorsal lithotomy position making sure to pad all pertinent neurovascular pressure points appropriately.  Following this, a time-out was performed which the correct patient, surgical site, and procedure were identified and agreed upon by the team.  His genitals were then prepped and draped in usual sterile fashion.  Rigid cystoscope was passed per urethra into the bladder.  In the bladder, there was a left ureteral stent emanating from the left ureteral orifice.  A sensor tip wire was placed up alongside this with a good curl on the left renal pelvis on  fluoroscopy.  Then, stent graspers used to grasp the ureteral stent and this was brought to the urethral meatus.  A second sensor tip wire was placed up this stent into the left renal pelvis with good curl on fluoroscopy.  One wire was secured as a safety wire, the other was used as a working wire.  Next, a 12-14 ureteral access sheath was placed over the working wire under fluoroscopy.  This went up with ease into the proximal ureter and then the obturator and wire were removed.  Flexible digital ureteroscope was then placed up the access sheath and into the left kidney.  The kidney was evaluated in systematic fashion.  There were 2 stones noted; 1 in the mid pole and 1 in the lower pole.  These had been obstructing previously, but were likely pushed back with ureteral stent placement. These were too large to remove with the basket.  Therefore, a laser lithotripsy was carried out with the holmium laser.  A 200 micron fiber was placed and lithotripsy was carried out at a setting of 0.5 joules and 20 Hz.  After the fragments were small enough to be removed, they were grasped with a ZeroTip Nitinol basket and removed.  These were given to the family after the procedure.  Following this, due to the large amount of edema in the patient's proximal ureter, I felt that ureteral stent needed to remain in place.  The ureteroscope was removed and the entirety of the ureter was visualized along with the access sheath and there were no injuries to the ureter.  Next, the safety wire was used to load through a cystoscope and a 6 x 26 double-J ureteral stent was placed over the wire with ease and the strings remained in place.  A good curl was noted in the left renal pelvis on fluoroscopy and a good curl was noted in the bladder on direct visualization.  The bladder was drained and then the strings were secured to the patient's penis.  Belladonna and opioid suppository had been placed in his rectum prior  to beginning the procedure.  He will be discharged home.          ______________________________ Natalia Leatherwood, MD     DW/MEDQ  D:  05/04/2012  T:  05/05/2012  Job:  [id number]

## 2012-05-06 ENCOUNTER — Encounter (HOSPITAL_COMMUNITY): Payer: Self-pay | Admitting: Urology

## 2012-09-28 ENCOUNTER — Other Ambulatory Visit: Payer: Self-pay | Admitting: Family Medicine

## 2012-09-28 DIAGNOSIS — J984 Other disorders of lung: Secondary | ICD-10-CM

## 2012-10-17 ENCOUNTER — Other Ambulatory Visit: Payer: BC Managed Care – PPO

## 2013-02-27 ENCOUNTER — Telehealth: Payer: Self-pay | Admitting: Family Medicine

## 2013-02-27 MED ORDER — FLUTICASONE PROPIONATE 50 MCG/ACT NA SUSP: 2.0000 | Freq: Every day | NASAL | Status: DC

## 2013-02-27 NOTE — Telephone Encounter (Signed)
Try flonase and mucinex.  Call if no better in 7days. I e-scribed flonase

## 2013-02-27 NOTE — Telephone Encounter (Signed)
The call was about nose bleeds. He has been having them since Sat. 4/5 and he is on Plavix. Pharmacists told him not to take the Flonase  that it would make the nose bleeds worse. I informed pt's wife on the procedure of stopping a nose bleed with Afrin and to put some vaseline up in his nostril to help moisturize and if she can not get it to stop or the bleeding last longer then 7-10 minutes to seek medical attention ASAP.

## 2013-02-28 NOTE — Telephone Encounter (Signed)
noted 

## 2013-03-23 ENCOUNTER — Other Ambulatory Visit: Payer: Self-pay | Admitting: Family Medicine

## 2013-09-05 ENCOUNTER — Ambulatory Visit: Payer: Self-pay | Admitting: Family Medicine

## 2013-09-07 ENCOUNTER — Ambulatory Visit (INDEPENDENT_AMBULATORY_CARE_PROVIDER_SITE_OTHER): Payer: BC Managed Care – PPO | Admitting: Family Medicine

## 2013-09-07 ENCOUNTER — Encounter: Payer: Self-pay | Admitting: Family Medicine

## 2013-09-07 VITALS — BP 160/80 | HR 78 | Temp 98.2°F | Resp 18 | Wt 290.0 lb

## 2013-09-07 DIAGNOSIS — IMO0001 Reserved for inherently not codable concepts without codable children: Secondary | ICD-10-CM

## 2013-09-07 DIAGNOSIS — E785 Hyperlipidemia, unspecified: Secondary | ICD-10-CM

## 2013-09-07 DIAGNOSIS — Z23 Encounter for immunization: Secondary | ICD-10-CM

## 2013-09-07 DIAGNOSIS — Z125 Encounter for screening for malignant neoplasm of prostate: Secondary | ICD-10-CM

## 2013-09-07 DIAGNOSIS — I10 Essential (primary) hypertension: Secondary | ICD-10-CM

## 2013-09-07 LAB — CBC WITH DIFFERENTIAL/PLATELET
Basophils Absolute: 0 10*3/uL (ref 0.0–0.1)
Basophils Relative: 0 % (ref 0–1)
Eosinophils Relative: 5 % (ref 0–5)
HCT: 41.1 % (ref 39.0–52.0)
Lymphocytes Relative: 27 % (ref 12–46)
MCH: 29.2 pg (ref 26.0–34.0)
MCHC: 34.1 g/dL (ref 30.0–36.0)
MCV: 85.6 fL (ref 78.0–100.0)
Monocytes Absolute: 0.5 10*3/uL (ref 0.1–1.0)
RDW: 14.2 % (ref 11.5–15.5)

## 2013-09-07 LAB — COMPLETE METABOLIC PANEL WITH GFR
ALT: 41 U/L (ref 0–53)
AST: 30 U/L (ref 0–37)
Albumin: 4.4 g/dL (ref 3.5–5.2)
Alkaline Phosphatase: 83 U/L (ref 39–117)
BUN: 14 mg/dL (ref 6–23)
CO2: 23 mEq/L (ref 19–32)
Chloride: 106 mEq/L (ref 96–112)
Glucose, Bld: 146 mg/dL — ABNORMAL HIGH (ref 70–99)
Total Protein: 7 g/dL (ref 6.0–8.3)

## 2013-09-07 LAB — LIPID PANEL
HDL: 46 mg/dL (ref >39)
LDL Cholesterol: 101 mg/dL — ABNORMAL HIGH (ref 0–99)

## 2013-09-07 LAB — HEMOGLOBIN A1C: Hgb A1c MFr Bld: 9 % — ABNORMAL HIGH (ref <5.7)

## 2013-09-07 MED ORDER — PIOGLITAZONE HCL 45 MG PO TABS: 45.0000 mg | ORAL_TABLET | Freq: Every day | ORAL | Status: DC

## 2013-09-07 MED ORDER — AMLODIPINE BESYLATE 10 MG PO TABS: 10.0000 mg | ORAL_TABLET | Freq: Every day | ORAL | Status: DC

## 2013-09-07 NOTE — Progress Notes (Signed)
Subjective:    Patient ID: Erik Hancock, male    DOB: [birth year], 62 y.o.   MRN: [id number]  HPI  Is here today for followup of his medical conditions., 1 he has hypertension with a history of coronary artery disease. He is coming on lisinopril 40 mg by mouth daily and Toprol-XL 25 mg by mouth daily. His blood pressure today is elevated at 160/80. He denies any chest pain, shortness of breath, dyspnea on exertion. Unfortunately he is not engaging in any regular aerobic exercise. His weight continues to remain elevated at 290.  With regards to his blood sugar, since the addition back to his, his blood sugar has been running 1:15 to 1:30. He denies any hypoglycemic episodes. Unfortunately he has been out of Actos for one month due to the palm of his Recruitment consultant. Since stopping Actos his blood sugars are elevated to 200.  He also has a history of hyperlipidemia. His goal LDL is less than 70 given his history of coronary artery disease.  He is currently taking simvastatin 20 mg by mouth daily. He is compliant with taking aspirin and Plavix. He denies any myalgia or right upper quadrant pain. He is due for a flu shot as well as a pneumonia vaccine. Past Medical History  Diagnosis Date  . Diabetes mellitus   . Hypertension   . High cholesterol   . GERD (gastroesophageal reflux disease)   . Arthritis   . S/P primary angioplasty with coronary stent   . Left ureteral calculus   . Coronary artery disease [name]    [name]  . Sleep apnea     stopbang=5   Current Outpatient Prescriptions on File Prior to Visit  Medication Sig Dispense Refill  . clopidogrel (PLAVIX) 75 MG tablet TAKE 1 TABLET DAILY  90 tablet  3  . fluticasone (FLONASE) 50 MCG/ACT nasal spray Place 2 sprays into the nose daily.  16 g  6  . glipiZIDE (GLUCOTROL) 10 MG tablet TAKE 1 TABLET DAILY  90 tablet  3  . hyoscyamine (LEVSIN, ANASPAZ)  0.125 MG tablet Take 0.125 mg by mouth every 4 (four) hours.      . insulin glargine (LANTUS SOLOSTAR) 100 UNIT/ML injection Inject 25 Units into the skin at bedtime.       [name] lisinopril (PRINIVIL,ZESTRIL) 40 MG tablet TAKE 1 TABLET DAILY  90 tablet  3  . metFORMIN (GLUCOPHAGE) 1000 MG tablet TAKE 1 TABLET TWICE A DAY  180 tablet  3  . metoprolol succinate (TOPROL-XL) 25 MG 24 hr tablet TAKE 1 TABLET DAILY  90 tablet  3  . phenazopyridine (PYRIDIUM) 100 MG tablet Take 1 tablet (100 mg total) by mouth 3 (three) times daily as needed for pain.  30 tablet  1  . simvastatin (ZOCOR) 20 MG tablet Take 20 mg by mouth at bedtime.       . Tamsulosin HCl (FLOMAX) 0.4 MG CAPS Take 0.4 mg by mouth daily as needed. As needed only Per patient      . hyoscyamine (LEVSIN, ANASPAZ) 0.125 MG tablet Take 1 tablet (0.125 mg total) by mouth every 4 (four) hours as needed for cramping (bladder spasms).  40 tablet  4  . hyoscyamine (LEVSIN, ANASPAZ) 0.125 MG tablet Take 1 tablet (0.125 mg total) by mouth every 4 (four) hours as needed for cramping (bladder spasms).  40 tablet  4  . oxybutynin (DITROPAN)  5 MG tablet Take 1 tablet (5 mg total) by mouth every 6 (six) hours as needed.  40 tablet  4  . oxyCODONE (OXY IR/ROXICODONE) 5 MG immediate release tablet Take 1-2 tablets (5-10 mg total) by mouth every 4 (four) hours as needed for pain. For pain  70 tablet  0  . pantoprazole (PROTONIX) 40 MG tablet Take 40 mg by mouth daily as needed. For indigestion       No current facility-administered medications on file prior to visit.   No Known Allergies History   Social History  . Marital Status: Married    Spouse Name: N/A    Number of Children: N/A  . Years of Education: N/A   Occupational History  . Not on file.   Social History Main Topics  . Smoking status: Never Smoker   . Smokeless tobacco: Never Used  . Alcohol Use: No  . Drug Use: No  . Sexual Activity: Yes   Other Topics Concern  . Not on file    Social History Narrative  . No narrative on file     Review of Systems  All other systems reviewed and are negative.       Objective:   Physical Exam  Vitals reviewed. Constitutional: He is oriented to person, place, and time. He appears well-developed and well-nourished. No distress.  HENT:  Nose: Nose normal.  Mouth/Throat: Oropharynx is clear and moist. No oropharyngeal exudate.  Eyes: No scleral icterus.  Neck: Neck supple. No JVD present. No thyromegaly present.  Cardiovascular: Normal rate, regular rhythm, normal heart sounds and intact distal pulses.  Exam reveals no gallop and no friction rub.   No murmur heard. Pulmonary/Chest: Effort normal and breath sounds normal. No respiratory distress. He has no wheezes. He has no rales.  Abdominal: Soft. Bowel sounds are normal. He exhibits no distension and no mass. There is no tenderness. There is no rebound and no guarding.  Musculoskeletal: Normal range of motion. He exhibits edema.  Lymphadenopathy:    He has no cervical adenopathy.  Neurological: He is alert and oriented to person, place, and time. He has normal reflexes. He displays normal reflexes. No cranial nerve deficit. He exhibits normal muscle tone. Coordination normal.  Skin: Skin is warm. No rash noted. He is not diaphoretic.   patient has trace bipedal edema        Assessment & Plan:   1. HTN (hypertension) At Norvasc 10 mg by mouth daily. Recheck blood pressure in one month. - amLODipine (NORVASC) 10 MG tablet; Take 1 tablet (10 mg total) by mouth daily.  Dispense: 90 tablet; Refill: 3 - COMPLETE METABOLIC PANEL WITH GFR - CBC with Differential  2. Type II or unspecified type diabetes mellitus without mention of complication, uncontrolled Resume actos 45 mg poqday.  Goal blood sugars are less than 130 fasting and less than 162 hours after meals. I recommended the patient begin regular aerobic exercise for 15 minutes every day the daughter was trying to  lose 15-20 pounds over the next 6 months.  Recommended an annual eye exam. - COMPLETE METABOLIC PANEL WITH GFR - CBC with Differential - Hemoglobin A1c  3. HLD (hyperlipidemia) Check fasting lipid panel goal LDL is less than 70 - COMPLETE METABOLIC PANEL WITH GFR - Lipid panel  4. Prostate cancer screening Patient declines DRE. By withdrawing blood work we'll check a PSA.  The patient also received his flu vaccine and his pneumonia shot today - PSA

## 2013-09-07 NOTE — Addendum Note (Signed)
Addended by: Elvina Mattes T on: 09/07/2013 08:29 AM   Modules accepted: Orders

## 2013-10-17 ENCOUNTER — Encounter: Payer: Self-pay | Admitting: Family Medicine

## 2013-10-17 ENCOUNTER — Ambulatory Visit (INDEPENDENT_AMBULATORY_CARE_PROVIDER_SITE_OTHER): Payer: BC Managed Care – PPO | Admitting: Family Medicine

## 2013-10-17 VITALS — BP 126/68 | HR 80 | Temp 97.4°F | Resp 18 | Ht 69.5 in | Wt 291.0 lb

## 2013-10-17 NOTE — Progress Notes (Signed)
Subjective:    Patient ID: Erik Hancock, male    DOB: [birth year], 62 y.o.   MRN: [id number]  HPI  09/07/13 Is here today for followup of his medical conditions., 1 he has hypertension with a history of coronary artery disease. He is coming on lisinopril 40 mg by mouth daily and Toprol-XL 25 mg by mouth daily. His blood pressure today is elevated at 160/80. He denies any chest pain, shortness of breath, dyspnea on exertion. Unfortunately he is not engaging in any regular aerobic exercise. His weight continues to remain elevated at 290.  With regards to his blood sugar, since the addition back to his, his blood sugar has been running 1:15 to 1:30. He denies any hypoglycemic episodes. Unfortunately he has been out of Actos for one month due to the palm of his Rec[employer] consultant. Since stopping Actos his blood sugars are elevated to 200.  He also has a history of hyperlipidemia. His goal LDL is less than 70 given his history of coronary artery disease.  He is currently taking simvastatin 20 mg by mouth daily. He is compliant with taking aspirin and Plavix. He denies any myalgia or right upper quadrant pain. He is due for a flu shot as well as a pneumonia vaccine.  At that time, my plan was: 1. HTN (hypertension) At Norvasc 10 mg by mouth daily. Recheck blood pressure in one month. - amLODipine (NORVASC) 10 MG tablet; Take 1 tablet (10 mg total) by mouth daily.  Dispense: 90 tablet; Refill: 3 - COMPLETE METABOLIC PANEL WITH GFR - CBC with Differential  2. Type II or unspecified type diabetes mellitus without mention of complication, uncontrolled Resume actos 45 mg poqday.  Goal blood sugars are less than 130 fasting and less than 162 hours after meals. I recommended the patient begin regular aerobic exercise for 15 minutes every day the daughter was trying to lose 15-20 pounds over the next 6 months.  Recommended an annual eye exam. - COMPLETE METABOLIC PANEL WITH GFR - CBC with Differential -  Hemoglobin A1c  3. HLD (hyperlipidemia) Check fasting lipid panel goal LDL is less than 70 - COMPLETE METABOLIC PANEL WITH GFR - Lipid panel  4. Prostate cancer screening Patient declines DRE. By withdrawing blood work we'll check a PSA.  The patient also received his flu vaccine and his pneumonia shot today - PSA  Patient's hemoglobin A1c returned at 10. I resume the patient on Actos 45 mg by mouth daily. He continues Lantus 25 units subcutaneous daily. He is here to recheck. His fasting blood sugars are less than 130. His postprandial sugars the patient has not been checking. All his fasting blood sugars are 80-120 he is been checking that consistently every day. Furthermore his blood pressure is much improved.  Past Medical History  Diagnosis Date  . Diabetes mellitus   . Hypertension   . High cholesterol   . GERD (gastroesophageal reflux disease)   . Arthritis   . S/P primary angioplasty with coronary stent   . Left ureteral calculus   . Coronary artery disease [name]    [name]  . Sleep apnea     stopbang=5   Current Outpatient Prescriptions on File Prior to Visit  Medication Sig Dispense Refill  . amLODipine (NORVASC) 10 MG tablet Take 1 tablet (10 mg total) by mouth daily.  90 tablet  3  . clopidogrel (PLAVIX) 75 MG  tablet TAKE 1 TABLET DAILY  90 tablet  3  . fluticasone (FLONASE) 50 MCG/ACT nasal spray Place 2 sprays into the nose daily.  16 g  6  . glipiZIDE (GLUCOTROL) 10 MG tablet TAKE 1 TABLET DAILY  90 tablet  3  . hyoscyamine (LEVSIN, ANASPAZ) 0.125 MG tablet Take 0.125 mg by mouth every 4 (four) hours.      . insulin glargine (LANTUS SOLOSTAR) 100 UNIT/ML injection Inject 25 Units into the skin at bedtime.       [name] lisinopril (PRINIVIL,ZESTRIL) 40 MG tablet TAKE 1 TABLET DAILY  90 tablet  3  . metFORMIN (GLUCOPHAGE) 1000 MG tablet TAKE 1 TABLET TWICE A DAY  180 tablet  3  .  metoprolol succinate (TOPROL-XL) 25 MG 24 hr tablet TAKE 1 TABLET DAILY  90 tablet  3  . oxyCODONE (OXY IR/ROXICODONE) 5 MG immediate release tablet Take 1-2 tablets (5-10 mg total) by mouth every 4 (four) hours as needed for pain. For pain  70 tablet  0  . pantoprazole (PROTONIX) 40 MG tablet Take 40 mg by mouth daily as needed. For indigestion      . phenazopyridine (PYRIDIUM) 100 MG tablet Take 1 tablet (100 mg total) by mouth 3 (three) times daily as needed for pain.  30 tablet  1  . pioglitazone (ACTOS) 45 MG tablet Take 1 tablet (45 mg total) by mouth daily with breakfast.  90 tablet  3  . simvastatin (ZOCOR) 20 MG tablet Take 20 mg by mouth at bedtime.       . Tamsulosin HCl (FLOMAX) 0.4 MG CAPS Take 0.4 mg by mouth daily as needed. As needed only Per patient      . hyoscyamine (LEVSIN, ANASPAZ) 0.125 MG tablet Take 1 tablet (0.125 mg total) by mouth every 4 (four) hours as needed for cramping (bladder spasms).  40 tablet  4   No current facility-administered medications on file prior to visit.   No Known Allergies History   Social History  . Marital Status: Married    Spouse Name: N/A    Number of Children: N/A  . Years of Education: N/A   Occupational History  . Not on file.   Social History Main Topics  . Smoking status: Never Smoker   . Smokeless tobacco: Never Used  . Alcohol Use: No  . Drug Use: No  . Sexual Activity: Yes   Other Topics Concern  . Not on file   Social History Narrative  . No narrative on file     Review of Systems  All other systems reviewed and are negative.       Objective:   Physical Exam  Vitals reviewed. Constitutional: He is oriented to person, place, and time. He appears well-developed and well-nourished. No distress.  HENT:  Nose: Nose normal.  Mouth/Throat: Oropharynx is clear and moist. No oropharyngeal exudate.  Eyes: No scleral icterus.  Neck: Neck supple. No JVD present. No thyromegaly present.  Cardiovascular: Normal  rate, regular rhythm, normal heart sounds and intact distal pulses.  Exam reveals no gallop and no friction rub.   No murmur heard. Pulmonary/Chest: Effort normal and breath sounds normal. No respiratory distress. He has no wheezes. He has no rales.  Abdominal: Soft. Bowel sounds are normal. He exhibits no distension and no mass. There is no tenderness. There is no rebound and no guarding.  Musculoskeletal: Normal range of motion. He exhibits edema.  Lymphadenopathy:    He has no cervical adenopathy.  Neurological:  He is alert and oriented to person, place, and time. He has normal reflexes. No cranial nerve deficit. He exhibits normal muscle tone. Coordination normal.  Skin: Skin is warm. No rash noted. He is not diaphoretic.   patient has trace bipedal edema        Assessment & Plan:  1. Type II or unspecified type diabetes mellitus without mention of complication, uncontrolled Continue Actos 45 mg by mouth daily 3 continue Lantus 25 units subcutaneous daily. Begin checking fasting blood sugars 2 hours after every meal. His guard postprandial sugars less than 160. If his postprandial sugars are greater than 160 on Anaprox 5 units with supper. Recheck in 2 weeks.

## 2013-10-27 ENCOUNTER — Telehealth: Payer: Self-pay | Admitting: Family Medicine

## 2013-10-27 NOTE — Telephone Encounter (Signed)
LMTRC

## 2013-10-27 NOTE — Telephone Encounter (Signed)
Darl Pikes is calling today Wanting to know if we can fax in the strips instead of mailing  Call back number is  [phone] (daugheters cell she will be with daughter) or you can call [phone]

## 2013-11-28 NOTE — Telephone Encounter (Signed)
No return call 

## 2013-12-06 IMAGING — CT CT CHEST W/O CM
3 of 4 series · 17 of 30 positions shown, 19 images · non-contrast
Comparison: CT abdomen and pelvis 04/02/2012

CLINICAL DATA: Lung nodules seen on prior CT abdomen.

CT CHEST WITHOUT CONTRAST
TECHNIQUE: Multidetector CT imaging of the chest was performed
following the standard protocol without IV contrast.

[Series 3: chest w/o · axial · non-contrast · 0.76mm/px · z∈[-316,-106]mm · 4 of 71 slices shown]
[im 15/71  lung]
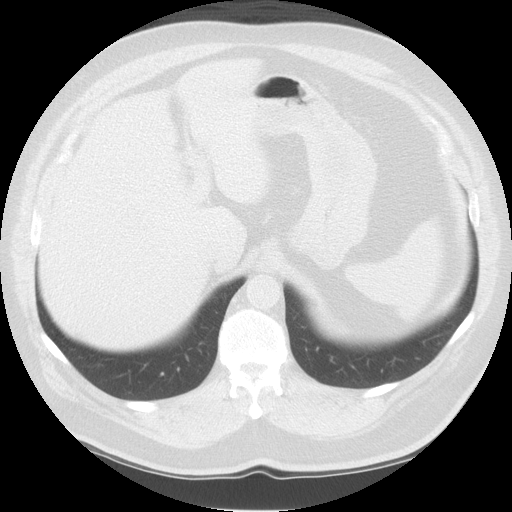
[im 29/71  lung]
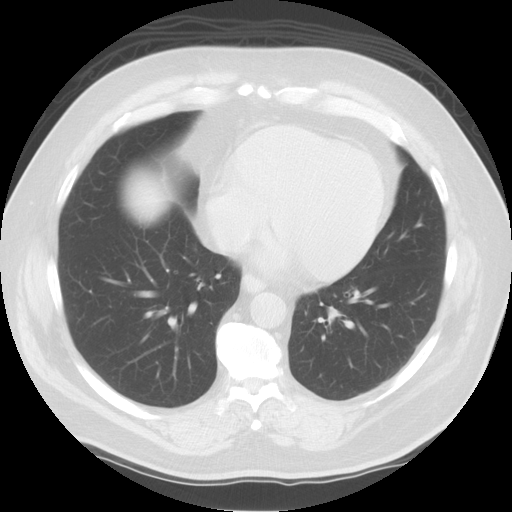
[im 43/71  lung]
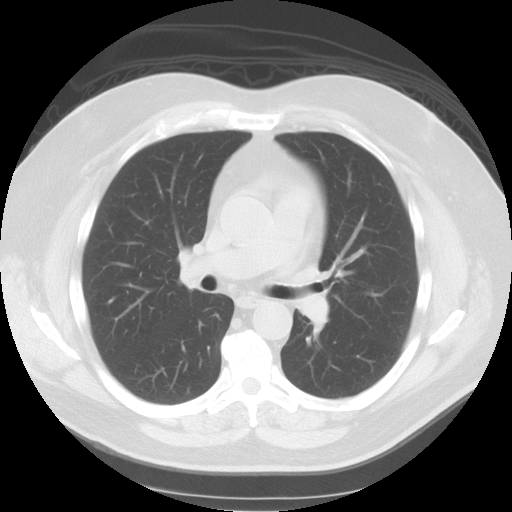
[im 57/71  lung]
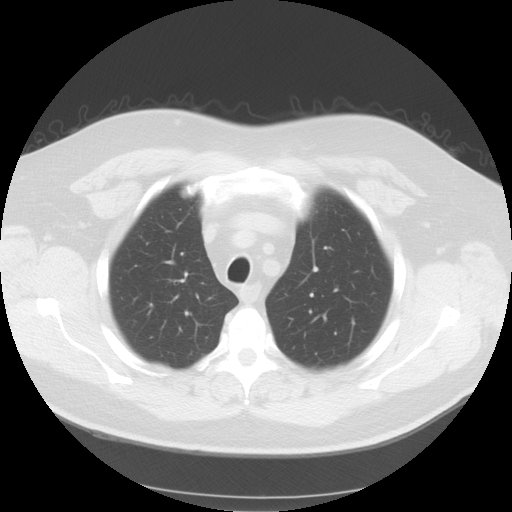

[Series 4: lung windows · axial · 0.76mm/px · z∈[-332,-96]mm · 5 of 71 slices shown, 7 images]
[im 12/71  mediastinal]
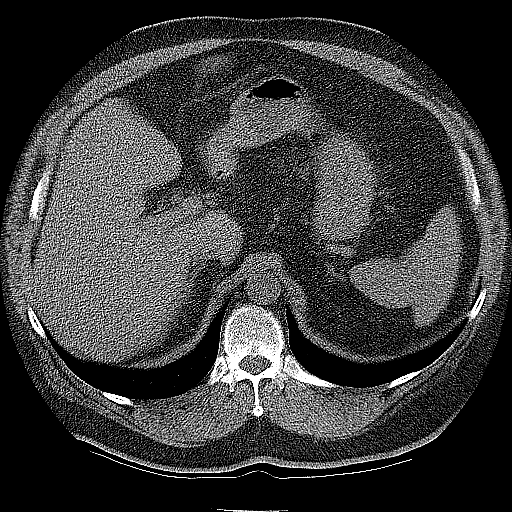
[im 12/71  lung]
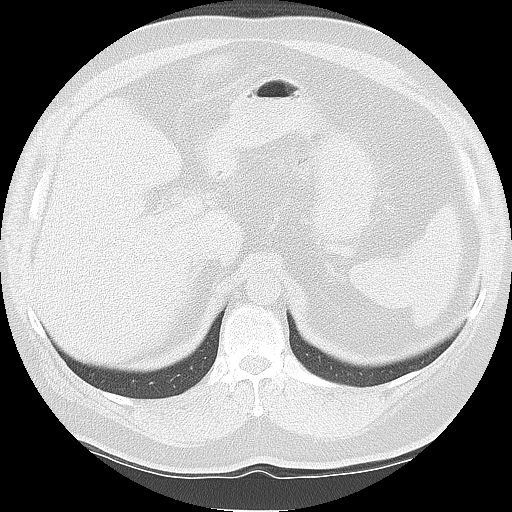
[im 24/71  lung]
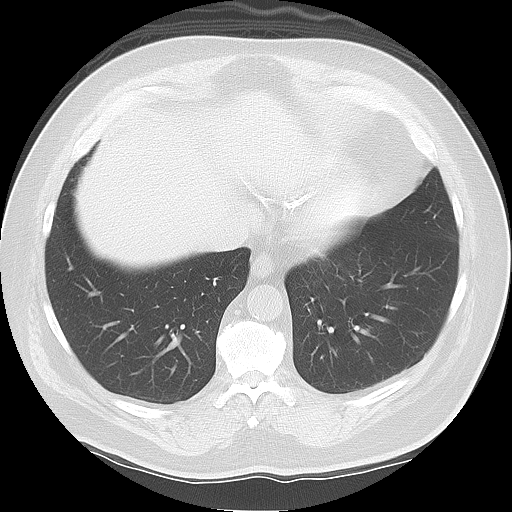
[im 36/71  lung]
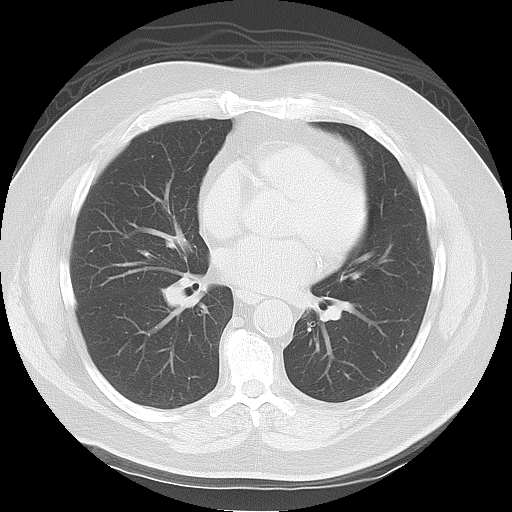
[im 47/71  lung]
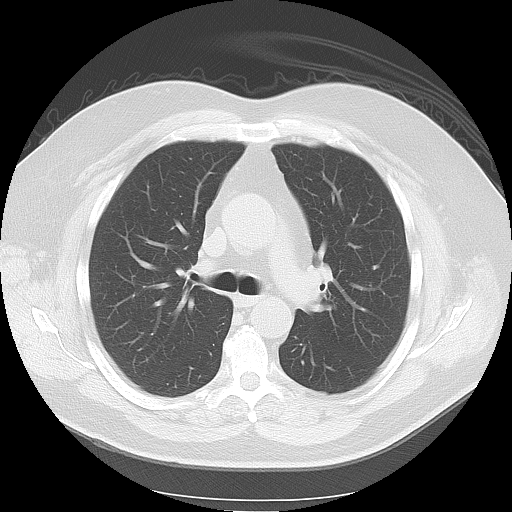
[im 59/71  mediastinal]
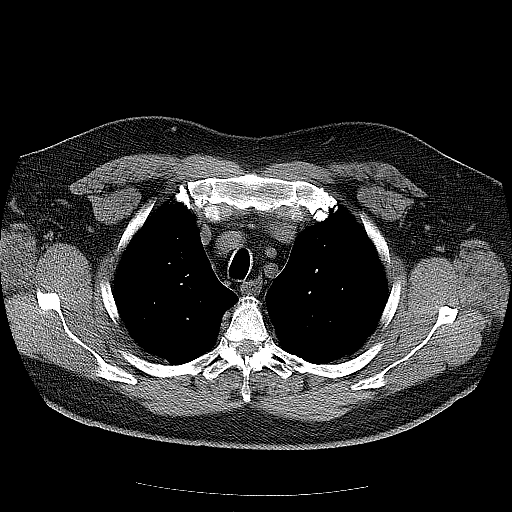
[im 59/71  lung]
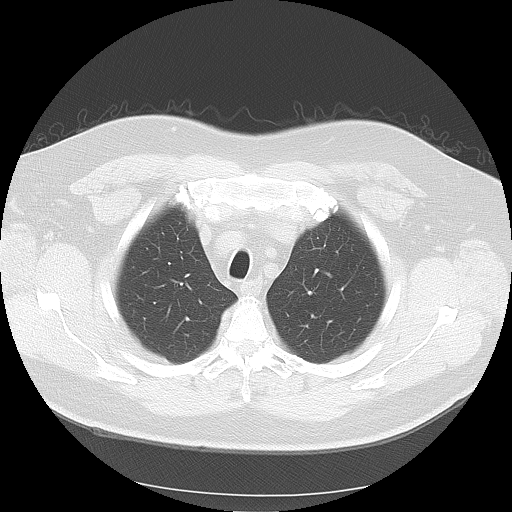

[Series 602: sagittal body · sagittal · 0.76mm/px · 8 of 157 slices shown]
[im 12/157  mediastinal]
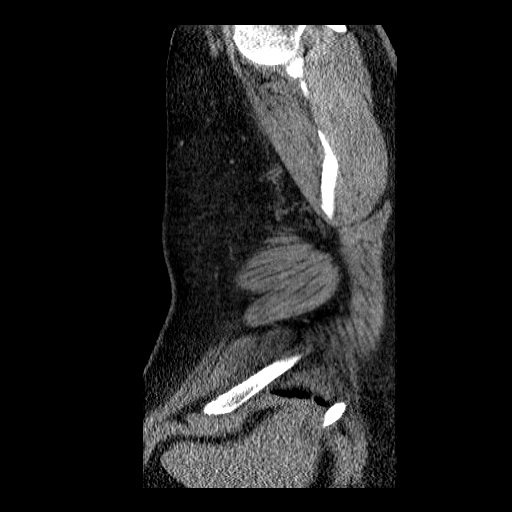
[im 34/157  mediastinal]
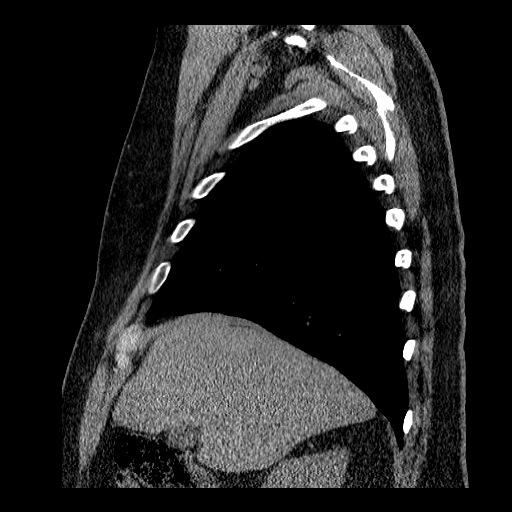
[im 56/157  mediastinal]
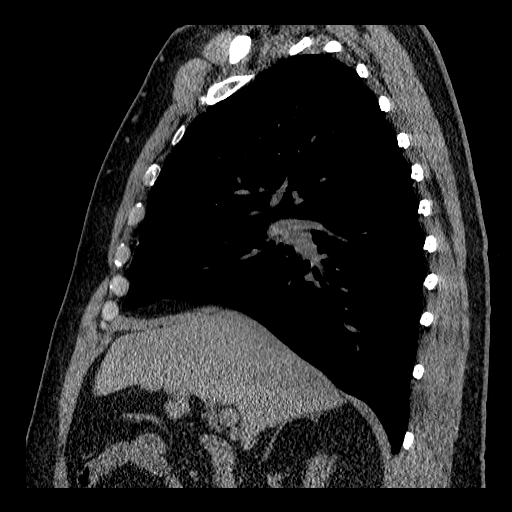
[im 67/157  mediastinal]
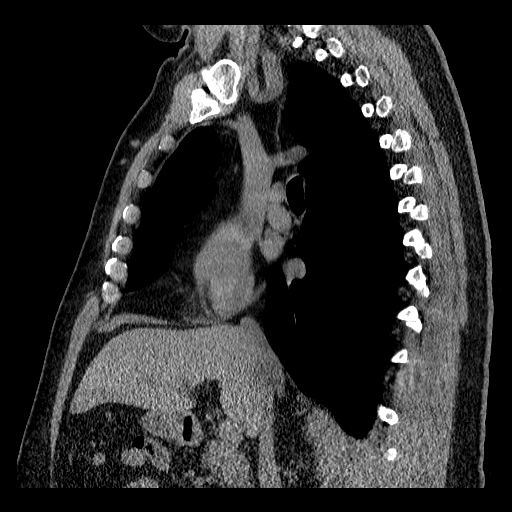
[im 90/157  mediastinal]
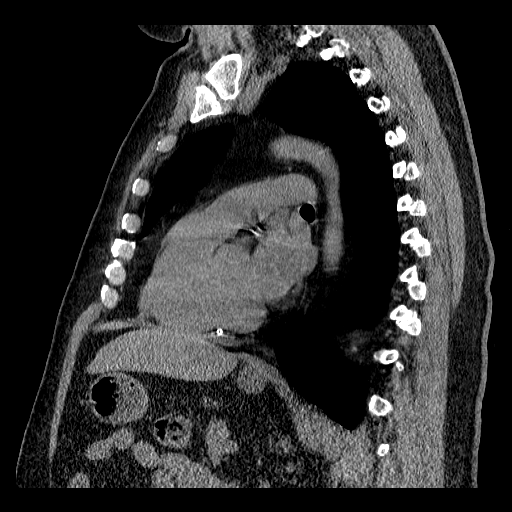
[im 101/157  mediastinal]
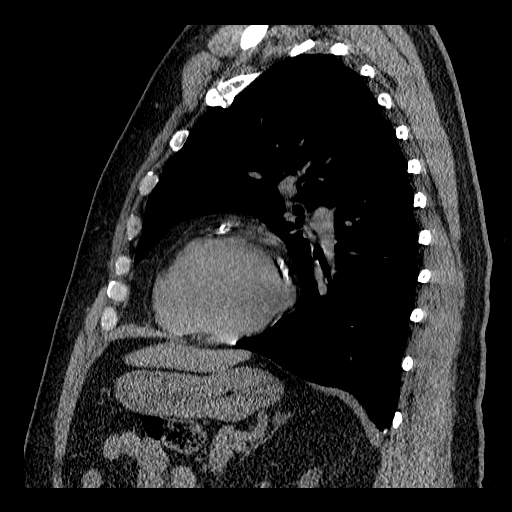
[im 123/157  mediastinal]
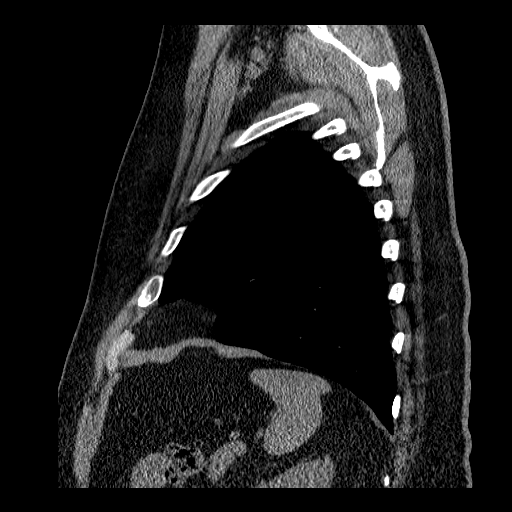
[im 145/157  mediastinal]
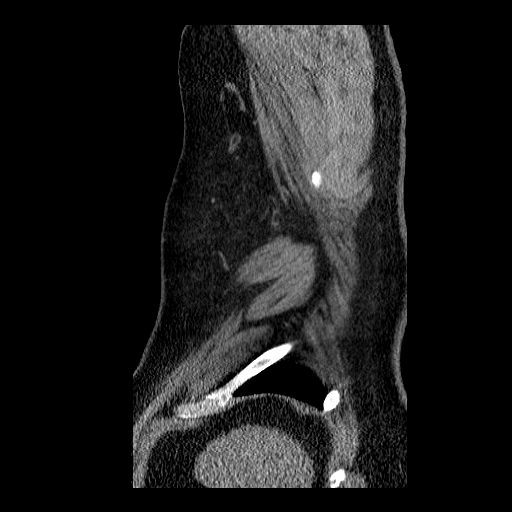

[17 of 30 positions shown; findings below may reference images not displayed]

FINDINGS: Again noted is the 7 mm right middle lobe nodule,
unchanged since recent CT abdomen.  Triangular shaped density is
noted in the right upper lobe on image 22 anteriorly, likely
intrapulmonary lymph node.  No additional pulmonary nodules.  No
pleural effusions.

Heart is normal size. Aorta is normal caliber.  Dense coronary
artery calcifications and all three coronary vessels. No
mediastinal, hilar, or axillary adenopathy.  Visualized thyroid and
chest wall soft tissues unremarkable. Imaging into the upper
abdomen shows no acute findings.  No confluent airspace opacities
in the lungs.

Imaging into the upper abdomen shows no acute findings.

Degenerative changes in the thoracic spine.  No acute bony
abnormality.
IMPRESSION: 7 mm right middle lobe nodule, stable since prior study.
Recommendations remain the same. If the patient is at high risk for
bronchogenic carcinoma, follow-up chest CT at 3-6 months is
recommended.  If the patient is at low risk for bronchogenic
carcinoma, follow-up chest CT at 6-12 months is recommended.  This
recommendation follows the consensus statement: Guidelines for
Management of Small Pulmonary Nodules Detected on CT Scans: A
Statement from the [HOSPITAL] as published in Radiology

Small triangular subpleural nodular density in the right upper lobe
anteriorly, most compatible with intrapulmonary lymph node.

Severe multivessel coronary artery disease.

## 2014-01-15 ENCOUNTER — Other Ambulatory Visit: Payer: Self-pay | Admitting: Family Medicine

## 2014-02-02 ENCOUNTER — Encounter: Payer: Self-pay | Admitting: Family Medicine

## 2014-02-02 ENCOUNTER — Ambulatory Visit (INDEPENDENT_AMBULATORY_CARE_PROVIDER_SITE_OTHER): Payer: BC Managed Care – PPO | Admitting: Family Medicine

## 2014-02-02 VITALS — BP 138/80 | HR 80 | Temp 97.4°F | Resp 18 | Ht 69.5 in | Wt <= 1120 oz

## 2014-02-02 DIAGNOSIS — N419 Inflammatory disease of prostate, unspecified: Secondary | ICD-10-CM

## 2014-02-02 DIAGNOSIS — Z23 Encounter for immunization: Secondary | ICD-10-CM

## 2014-02-02 DIAGNOSIS — R350 Frequency of micturition: Secondary | ICD-10-CM

## 2014-02-02 LAB — URINALYSIS, MICROSCOPIC ONLY
Bacteria, UA: NONE SEEN
CASTS: NONE SEEN
CRYSTALS: NONE SEEN
WBC, UA: NONE SEEN WBC/hpf (ref <3)

## 2014-02-02 LAB — URINALYSIS, ROUTINE W REFLEX MICROSCOPIC
Bilirubin Urine: NEGATIVE
Glucose, UA: NEGATIVE mg/dL
KETONES UR: NEGATIVE mg/dL
Leukocytes, UA: NEGATIVE
Nitrite: NEGATIVE
PH: 6 (ref 5.0–8.0)
Protein, ur: NEGATIVE mg/dL
SPECIFIC GRAVITY, URINE: 1.025 (ref 1.005–1.030)
Urobilinogen, UA: 0.2 mg/dL (ref 0.0–1.0)

## 2014-02-02 MED ORDER — CIPROFLOXACIN HCL 500 MG PO TABS: 500.0000 mg | ORAL_TABLET | Freq: Two times a day (BID) | ORAL | Status: DC

## 2014-02-02 MED ORDER — SIMVASTATIN 20 MG PO TABS: 20.0000 mg | ORAL_TABLET | Freq: Every day | ORAL | Status: DC

## 2014-02-02 NOTE — Progress Notes (Signed)
Subjective:    Patient ID: Erik Hancock, male    DOB: August 24, 1951, 63 y.o.   MRN: [id number]  HPI Patient reports 2 weeks of increasing urinary frequency, urinary hesitancy, weak stream, and mild dysuria. He also reports pain and pressure in his testicles and his lower back. His urinalysis today shows trace blood but is otherwise normal. He has a previous history of kidney stones but states that the pain does not feel like that. Past Medical History  Diagnosis Date  . Diabetes mellitus   . Hypertension   . High cholesterol   . GERD (gastroesophageal reflux disease)   . Arthritis   . S/P primary angioplasty with coronary stent   . Left ureteral calculus   . Coronary artery disease [name]    [name]  . Sleep apnea     stopbang=5   Current Outpatient Prescriptions on File Prior to Visit  Medication Sig Dispense Refill  . amLODipine (NORVASC) 10 MG tablet Take 1 tablet (10 mg total) by mouth daily.  90 tablet  3  . clopidogrel (PLAVIX) 75 MG tablet TAKE 1 TABLET DAILY  90 tablet  3  . glipiZIDE (GLUCOTROL) 10 MG tablet TAKE 1 TABLET DAILY  90 tablet  3  . lisinopril (PRINIVIL,ZESTRIL) 40 MG tablet TAKE 1 TABLET DAILY  90 tablet  3  . metFORMIN (GLUCOPHAGE) 1000 MG tablet TAKE 1 TABLET TWICE A DAY  180 tablet  3  . metoprolol succinate (TOPROL-XL) 25 MG 24 hr tablet TAKE 1 TABLET DAILY  90 tablet  3  . pioglitazone (ACTOS) 45 MG tablet Take 1 tablet (45 mg total) by mouth daily with breakfast.  90 tablet  3   No current facility-administered medications on file prior to visit.   No Known Allergies History   Social History  . Marital Status: Married    Spouse Name: N/A    Number of Children: N/A  . Years of Education: N/A   Occupational History  . Not on file.   Social History Main Topics  . Smoking status: Never Smoker   . Smokeless tobacco: Never Used  . Alcohol Use: No  . Drug Use: No   . Sexual Activity: Yes   Other Topics Concern  . Not on file   Social History Narrative  . No narrative on file      Review of Systems  All other systems reviewed and are negative.       Objective:   Physical Exam  Vitals reviewed. Constitutional: He appears well-developed and well-nourished.  Cardiovascular: Normal rate, regular rhythm, normal heart sounds and intact distal pulses.   No murmur heard. Pulmonary/Chest: Effort normal and breath sounds normal. No respiratory distress. He has no wheezes. He has no rales.  Abdominal: Soft. Bowel sounds are normal. He exhibits no distension. There is no tenderness. There is no rebound.   prostate is not enlarged. There no prostate masses. It is nonswollen the       Assessment & Plan:  1. Urinary frequency - Urinalysis, Routine w reflex microscopic  2. Prostatitis I am concerned patient may have prostatitis. I recommended Cipro 500 mg by mouth twice a day for 10 days. Recheck in 10 days sooner if symptoms worsen - ciprofloxacin (CIPRO) 500 MG tablet; Take 1 tablet (500 mg total) by mouth 2 (two) times daily.  Dispense: 20 tablet; Refill: 0  3. Need for prophylactic  vaccination and inoculation against unspecified single disease - Tdap vaccine greater than or equal to 7yo IM

## 2014-02-05 ENCOUNTER — Encounter (INDEPENDENT_AMBULATORY_CARE_PROVIDER_SITE_OTHER): Payer: BC Managed Care – PPO | Admitting: Ophthalmology

## 2014-02-05 DIAGNOSIS — E1165 Type 2 diabetes mellitus with hyperglycemia: Secondary | ICD-10-CM

## 2014-02-05 DIAGNOSIS — I10 Essential (primary) hypertension: Secondary | ICD-10-CM

## 2014-02-05 DIAGNOSIS — E11319 Type 2 diabetes mellitus with unspecified diabetic retinopathy without macular edema: Secondary | ICD-10-CM

## 2014-02-05 DIAGNOSIS — H43819 Vitreous degeneration, unspecified eye: Secondary | ICD-10-CM

## 2014-02-05 DIAGNOSIS — E11311 Type 2 diabetes mellitus with unspecified diabetic retinopathy with macular edema: Secondary | ICD-10-CM

## 2014-02-05 DIAGNOSIS — H35039 Hypertensive retinopathy, unspecified eye: Secondary | ICD-10-CM

## 2014-02-05 DIAGNOSIS — E1139 Type 2 diabetes mellitus with other diabetic ophthalmic complication: Secondary | ICD-10-CM

## 2014-02-05 DIAGNOSIS — H2510 Age-related nuclear cataract, unspecified eye: Secondary | ICD-10-CM

## 2014-03-05 ENCOUNTER — Encounter (INDEPENDENT_AMBULATORY_CARE_PROVIDER_SITE_OTHER): Payer: BC Managed Care – PPO | Admitting: Ophthalmology

## 2014-03-05 DIAGNOSIS — I10 Essential (primary) hypertension: Secondary | ICD-10-CM

## 2014-03-05 DIAGNOSIS — E1165 Type 2 diabetes mellitus with hyperglycemia: Secondary | ICD-10-CM

## 2014-03-05 DIAGNOSIS — E11311 Type 2 diabetes mellitus with unspecified diabetic retinopathy with macular edema: Secondary | ICD-10-CM

## 2014-03-05 DIAGNOSIS — E11319 Type 2 diabetes mellitus with unspecified diabetic retinopathy without macular edema: Secondary | ICD-10-CM

## 2014-03-05 DIAGNOSIS — H35039 Hypertensive retinopathy, unspecified eye: Secondary | ICD-10-CM

## 2014-03-05 DIAGNOSIS — H43819 Vitreous degeneration, unspecified eye: Secondary | ICD-10-CM

## 2014-03-05 DIAGNOSIS — H2510 Age-related nuclear cataract, unspecified eye: Secondary | ICD-10-CM

## 2014-03-05 DIAGNOSIS — E1139 Type 2 diabetes mellitus with other diabetic ophthalmic complication: Secondary | ICD-10-CM

## 2014-04-03 ENCOUNTER — Encounter (INDEPENDENT_AMBULATORY_CARE_PROVIDER_SITE_OTHER): Payer: BC Managed Care – PPO | Admitting: Ophthalmology

## 2014-04-03 DIAGNOSIS — I10 Essential (primary) hypertension: Secondary | ICD-10-CM

## 2014-04-03 DIAGNOSIS — E11319 Type 2 diabetes mellitus with unspecified diabetic retinopathy without macular edema: Secondary | ICD-10-CM

## 2014-04-03 DIAGNOSIS — E11311 Type 2 diabetes mellitus with unspecified diabetic retinopathy with macular edema: Secondary | ICD-10-CM

## 2014-04-03 DIAGNOSIS — E1165 Type 2 diabetes mellitus with hyperglycemia: Secondary | ICD-10-CM

## 2014-04-03 DIAGNOSIS — H43819 Vitreous degeneration, unspecified eye: Secondary | ICD-10-CM

## 2014-04-03 DIAGNOSIS — H35039 Hypertensive retinopathy, unspecified eye: Secondary | ICD-10-CM

## 2014-04-03 DIAGNOSIS — E1139 Type 2 diabetes mellitus with other diabetic ophthalmic complication: Secondary | ICD-10-CM

## 2014-05-01 ENCOUNTER — Encounter (INDEPENDENT_AMBULATORY_CARE_PROVIDER_SITE_OTHER): Payer: BC Managed Care – PPO | Admitting: Ophthalmology

## 2014-05-01 DIAGNOSIS — H35039 Hypertensive retinopathy, unspecified eye: Secondary | ICD-10-CM

## 2014-05-01 DIAGNOSIS — H43819 Vitreous degeneration, unspecified eye: Secondary | ICD-10-CM

## 2014-05-01 DIAGNOSIS — E1139 Type 2 diabetes mellitus with other diabetic ophthalmic complication: Secondary | ICD-10-CM

## 2014-05-01 DIAGNOSIS — I10 Essential (primary) hypertension: Secondary | ICD-10-CM

## 2014-05-01 DIAGNOSIS — E1165 Type 2 diabetes mellitus with hyperglycemia: Secondary | ICD-10-CM

## 2014-05-01 DIAGNOSIS — E11319 Type 2 diabetes mellitus with unspecified diabetic retinopathy without macular edema: Secondary | ICD-10-CM

## 2014-05-01 DIAGNOSIS — E11311 Type 2 diabetes mellitus with unspecified diabetic retinopathy with macular edema: Secondary | ICD-10-CM

## 2014-05-09 ENCOUNTER — Other Ambulatory Visit (INDEPENDENT_AMBULATORY_CARE_PROVIDER_SITE_OTHER): Payer: BC Managed Care – PPO | Admitting: Ophthalmology

## 2014-05-09 DIAGNOSIS — H3581 Retinal edema: Secondary | ICD-10-CM

## 2014-05-15 ENCOUNTER — Other Ambulatory Visit: Payer: Self-pay | Admitting: Family Medicine

## 2014-05-15 NOTE — Telephone Encounter (Signed)
Refill appropriate and filled per protocol. 

## 2014-05-29 ENCOUNTER — Encounter (INDEPENDENT_AMBULATORY_CARE_PROVIDER_SITE_OTHER): Payer: BC Managed Care – PPO | Admitting: Ophthalmology

## 2014-05-29 DIAGNOSIS — H43819 Vitreous degeneration, unspecified eye: Secondary | ICD-10-CM

## 2014-05-29 DIAGNOSIS — E11311 Type 2 diabetes mellitus with unspecified diabetic retinopathy with macular edema: Secondary | ICD-10-CM

## 2014-05-29 DIAGNOSIS — I10 Essential (primary) hypertension: Secondary | ICD-10-CM

## 2014-05-29 DIAGNOSIS — E11319 Type 2 diabetes mellitus with unspecified diabetic retinopathy without macular edema: Secondary | ICD-10-CM

## 2014-05-29 DIAGNOSIS — E1139 Type 2 diabetes mellitus with other diabetic ophthalmic complication: Secondary | ICD-10-CM

## 2014-05-29 DIAGNOSIS — E1165 Type 2 diabetes mellitus with hyperglycemia: Secondary | ICD-10-CM

## 2014-05-29 DIAGNOSIS — H2510 Age-related nuclear cataract, unspecified eye: Secondary | ICD-10-CM

## 2014-05-29 DIAGNOSIS — H35039 Hypertensive retinopathy, unspecified eye: Secondary | ICD-10-CM

## 2014-06-02 ENCOUNTER — Other Ambulatory Visit: Payer: Self-pay | Admitting: *Deleted

## 2014-06-02 DIAGNOSIS — E1165 Type 2 diabetes mellitus with hyperglycemia: Principal | ICD-10-CM

## 2014-06-02 DIAGNOSIS — E785 Hyperlipidemia, unspecified: Secondary | ICD-10-CM

## 2014-06-02 DIAGNOSIS — IMO0001 Reserved for inherently not codable concepts without codable children: Secondary | ICD-10-CM

## 2014-06-02 DIAGNOSIS — I10 Essential (primary) hypertension: Secondary | ICD-10-CM

## 2014-06-19 ENCOUNTER — Other Ambulatory Visit: Payer: BC Managed Care – PPO

## 2014-06-19 DIAGNOSIS — E1165 Type 2 diabetes mellitus with hyperglycemia: Principal | ICD-10-CM

## 2014-06-19 DIAGNOSIS — I10 Essential (primary) hypertension: Secondary | ICD-10-CM

## 2014-06-19 DIAGNOSIS — E785 Hyperlipidemia, unspecified: Secondary | ICD-10-CM

## 2014-06-19 DIAGNOSIS — IMO0001 Reserved for inherently not codable concepts without codable children: Secondary | ICD-10-CM

## 2014-06-19 LAB — COMPLETE METABOLIC PANEL WITH GFR
ALBUMIN: 4.1 g/dL (ref 3.5–5.2)
ALK PHOS: 58 U/L (ref 39–117)
ALT: 14 U/L (ref 0–53)
AST: 17 U/L (ref 0–37)
BUN: 23 mg/dL (ref 6–23)
CALCIUM: 9.5 mg/dL (ref 8.4–10.5)
CHLORIDE: 107 meq/L (ref 96–112)
CO2: 23 meq/L (ref 19–32)
Creat: 1.2 mg/dL (ref 0.50–1.35)
GFR, EST NON AFRICAN AMERICAN: 64 mL/min
GFR, Est African American: 74 mL/min
GLUCOSE: 87 mg/dL (ref 70–99)
POTASSIUM: 4.6 meq/L (ref 3.5–5.3)
Sodium: 140 mEq/L (ref 135–145)
TOTAL PROTEIN: 6.5 g/dL (ref 6.0–8.3)
Total Bilirubin: 0.6 mg/dL (ref 0.2–1.2)

## 2014-06-19 LAB — CBC WITH DIFFERENTIAL/PLATELET
BASOS PCT: 0 % (ref 0–1)
Basophils Absolute: 0 10*3/uL (ref 0.0–0.1)
Eosinophils Absolute: 0.4 10*3/uL (ref 0.0–0.7)
Eosinophils Relative: 6 % — ABNORMAL HIGH (ref 0–5)
HEMATOCRIT: 36.4 % — AB (ref 39.0–52.0)
Hemoglobin: 12.5 g/dL — ABNORMAL LOW (ref 13.0–17.0)
LYMPHS ABS: 1.8 10*3/uL (ref 0.7–4.0)
Lymphocytes Relative: 30 % (ref 12–46)
MCH: 29.9 pg (ref 26.0–34.0)
MCHC: 34.3 g/dL (ref 30.0–36.0)
MCV: 87.1 fL (ref 78.0–100.0)
MONO ABS: 0.7 10*3/uL (ref 0.1–1.0)
Monocytes Relative: 11 % (ref 3–12)
NEUTROS ABS: 3.2 10*3/uL (ref 1.7–7.7)
Neutrophils Relative %: 53 % (ref 43–77)
Platelets: 269 10*3/uL (ref 150–400)
RBC: 4.18 MIL/uL — AB (ref 4.22–5.81)
RDW: 14.8 % (ref 11.5–15.5)
WBC: 6 10*3/uL (ref 4.0–10.5)

## 2014-06-19 LAB — LIPID PANEL
Cholesterol: 121 mg/dL (ref 0–200)
HDL: 51 mg/dL (ref >39)
LDL CALC: 51 mg/dL (ref 0–99)
Total CHOL/HDL Ratio: 2.4 Ratio
Triglycerides: 97 mg/dL (ref <150)
VLDL: 19 mg/dL (ref 0–40)

## 2014-06-19 LAB — HEMOGLOBIN A1C
Hgb A1c MFr Bld: 6.7 % — ABNORMAL HIGH (ref <5.7)
Mean Plasma Glucose: 146 mg/dL — ABNORMAL HIGH (ref <117)

## 2014-06-22 ENCOUNTER — Encounter: Payer: Self-pay | Admitting: Family Medicine

## 2014-06-22 ENCOUNTER — Ambulatory Visit (INDEPENDENT_AMBULATORY_CARE_PROVIDER_SITE_OTHER): Payer: BC Managed Care – PPO | Admitting: Family Medicine

## 2014-06-22 VITALS — BP 110/70 | HR 72 | Temp 97.3°F | Resp 18 | Ht 69.5 in | Wt 306.0 lb

## 2014-06-22 DIAGNOSIS — E785 Hyperlipidemia, unspecified: Secondary | ICD-10-CM

## 2014-06-22 DIAGNOSIS — Z1211 Encounter for screening for malignant neoplasm of colon: Secondary | ICD-10-CM

## 2014-06-22 DIAGNOSIS — IMO0001 Reserved for inherently not codable concepts without codable children: Secondary | ICD-10-CM

## 2014-06-22 DIAGNOSIS — E1165 Type 2 diabetes mellitus with hyperglycemia: Principal | ICD-10-CM

## 2014-06-22 DIAGNOSIS — I10 Essential (primary) hypertension: Secondary | ICD-10-CM

## 2014-06-22 NOTE — Progress Notes (Signed)
Subjective:    Patient ID: Erik Hancock, male    DOB: 1951/08/16, 63 y.o.   MRN: [id number]  HPI Patient is here today for followup of his diabetes mellitus, hypertension, hyperlipidemia. He has a past medical history of coronary artery disease. His blood pressure currently well controlled at 110/70. He's been trying to consume a low carbohydrate diet. He's been successful in dropping his hemoglobin A1c from 9.0-6.7. Unfortunately his weight has risen approximately 15 pounds since his last office visit. He is not exercising. He is not exercising portion control. His diabetic eye exam is up to date. He is seeing a retina specialist because he does have some retinopathy. His most recent blood work is listed below: Appointment on 06/19/2014  Component Date Value Ref Range Status  . WBC 06/19/2014 6.0  4.0 - 10.5 K/uL Final  . RBC 06/19/2014 4.18* 4.22 - 5.81 MIL/uL Final  . Hemoglobin 06/19/2014 12.5* 13.0 - 17.0 g/dL Final  . HCT 06/19/2014 36.4* 39.0 - 52.0 % Final  . MCV 06/19/2014 87.1  78.0 - 100.0 fL Final  . MCH 06/19/2014 29.9  26.0 - 34.0 pg Final  . MCHC 06/19/2014 34.3  30.0 - 36.0 g/dL Final  . RDW 06/19/2014 14.8  11.5 - 15.5 % Final  . Platelets 06/19/2014 269  150 - 400 K/uL Final  . Neutrophils Relative % 06/19/2014 53  43 - 77 % Final  . Neutro Abs 06/19/2014 3.2  1.7 - 7.7 K/uL Final  . Lymphocytes Relative 06/19/2014 30  12 - 46 % Final  . Lymphs Abs 06/19/2014 1.8  0.7 - 4.0 K/uL Final  . Monocytes Relative 06/19/2014 11  3 - 12 % Final  . Monocytes Absolute 06/19/2014 0.7  0.1 - 1.0 K/uL Final  . Eosinophils Relative 06/19/2014 6* 0 - 5 % Final  . Eosinophils Absolute 06/19/2014 0.4  0.0 - 0.7 K/uL Final  . Basophils Relative 06/19/2014 0  0 - 1 % Final  . Basophils Absolute 06/19/2014 0.0  0.0 - 0.1 K/uL Final  . Smear Review 06/19/2014 Criteria for review not met   Final  . Sodium 06/19/2014 140  135 - 145 mEq/L Final  . Potassium 06/19/2014 4.6  3.5 - 5.3 mEq/L  Final  . Chloride 06/19/2014 107  96 - 112 mEq/L Final  . CO2 06/19/2014 23  19 - 32 mEq/L Final  . Glucose, Bld 06/19/2014 87  70 - 99 mg/dL Final  . BUN 06/19/2014 23  6 - 23 mg/dL Final  . Creat 06/19/2014 1.20  0.50 - 1.35 mg/dL Final  . Total Bilirubin 06/19/2014 0.6  0.2 - 1.2 mg/dL Final  . Alkaline Phosphatase 06/19/2014 58  39 - 117 U/L Final  . AST 06/19/2014 17  0 - 37 U/L Final  . ALT 06/19/2014 14  0 - 53 U/L Final  . Total Protein 06/19/2014 6.5  6.0 - 8.3 g/dL Final  . Albumin 06/19/2014 4.1  3.5 - 5.2 g/dL Final  . Calcium 06/19/2014 9.5  8.4 - 10.5 mg/dL Final  . GFR, Est African American 06/19/2014 74   Final  . GFR, Est Non African American 06/19/2014 64   Final   Comment:                            The estimated GFR is a calculation valid for adults (>=59 years old)  that uses the CKD-EPI algorithm to adjust for age and sex. It is                            not to be used for children, pregnant women, hospitalized patients,                             patients on dialysis, or with rapidly changing kidney function.                          According to the NKDEP, eGFR >89 is normal, 60-89 shows mild                          impairment, 30-59 shows moderate impairment, 15-29 shows severe                          impairment and <15 is ESRD.                             [name] Hemoglobin A1C 06/19/2014 6.7* <5.7 % Final   Comment:                                                                                                 According to the ADA Clinical Practice Recommendations for 2011, when                          HbA1c is used as a screening test:                                                       >=6.5%   Diagnostic of Diabetes Mellitus                                     (if abnormal result is confirmed)                                                     5.7-6.4%   Increased risk of developing Diabetes Mellitus                                                       References:Diagnosis and Classification of Diabetes Mellitus,Diabetes  [id number],6578,46(NGEXB 1):S62-S69 and Standards of Medical Care in                                  Diabetes - 2011,Diabetes [id number] (Suppl 1):S11-S61.                             . Mean Plasma Glucose 06/19/2014 146* <117 mg/dL Final  . Cholesterol 06/19/2014 121  0 - 200 mg/dL Final   Comment: ATP III Classification:                                < 200        mg/dL        Desirable                               200 - 239     mg/dL        Borderline High                               >= 240        mg/dL        High                             . Triglycerides 06/19/2014 97  <150 mg/dL Final  . HDL 06/19/2014 51  >39 mg/dL Final  . Total CHOL/HDL Ratio 06/19/2014 2.4   Final  . VLDL 06/19/2014 19  0 - 40 mg/dL Final  . LDL Cholesterol 06/19/2014 51  0 - 99 mg/dL Final   Comment:                            Total Cholesterol/HDL Ratio:CHD Risk                                                 Coronary Heart Disease Risk Table                                                                 Men       Women                                   1/2 Average Risk              3.4        3.3                                       Average Risk              5.0  4.4                                    2X Average Risk              9.6        7.1                                    3X Average Risk             23.4       11.0                          Use the calculated Patient Ratio above and the CHD Risk table                           to determine the patient's CHD Risk.                          ATP III Classification (LDL):                                < 100        mg/dL         Optimal                               100 - 129     mg/dL         Near or Above Optimal                               130 - 159     mg/dL         Borderline High                               160 - 189      mg/dL         High                                > 190        mg/dL         Very High                              Past Medical History  Diagnosis Date  . Diabetes mellitus   . Hypertension   . High cholesterol   . GERD (gastroesophageal reflux disease)   . Arthritis   . S/P primary angioplasty with coronary stent   . Left ureteral calculus   . Coronary artery disease [name]    [name]  . Sleep apnea     stopbang=5   Past Surgical History  Procedure Laterality Date  . Left ureteroscopic stone extraction  03-14-2003    X2  . Cervical scoville foraminotomy  w/ excision of herniated nuclec pulposus  2009    C6 - 7  . Cardiac catheterization  JUNE 2003    PATENT LAD STENT/ BODERLINE OBSTRUCTIVE DISEASE POSTERIOR DESCENDING ARTERY/ NORMAL LVF  . Coronary angioplasty with stent placement  03-30-2006  DR EDMUNDS    90% LEFT CIRMCUMFLEX  --  DE STENT X1 /  95% POSTERIOR DESCENDING ARTERY--- DE X1 STENT/ PATENT LEFT ANTERIOR DESCENDING STENT/ LOW NORMAL LVF/  . Coronary angioplasty with stent placement  JAN 2003--  DR TURNER    DE STENT TO LAD  . Cystoscopy with left stent placement  04-04-2012  . Cystoscopy with ureteroscopy  05/04/2012    Procedure: CYSTOSCOPY WITH URETEROSCOPY;  Surgeon: Molli Hazard, MD;  Location: [facility];  Service: Urology;  Laterality: Left;      . Cystoscopy w/ ureteral stent placement  05/04/2012    Procedure: CYSTOSCOPY WITH STENT REPLACEMENT;  Surgeon: Molli Hazard, MD;  Location: [facility];  Service: Urology;  Laterality: Left;  . Cystoscopy w/ retrogrades  05/04/2012    Procedure: CYSTOSCOPY WITH RETROGRADE PYELOGRAM;  Surgeon: Molli Hazard, MD;  Location: [facility];  Service: Urology;  Laterality: Left;   Current Outpatient Prescriptions on File Prior to Visit  Medication Sig Dispense Refill  . amLODipine (NORVASC) 10 MG tablet Take 1 tablet (10 mg  total) by mouth daily.  90 tablet  3  . aspirin 81 MG tablet Take 81 mg by mouth daily.      . clopidogrel (PLAVIX) 75 MG tablet TAKE 1 TABLET DAILY  90 tablet  2  . glipiZIDE (GLUCOTROL) 10 MG tablet TAKE 1 TABLET DAILY  90 tablet  2  . Insulin Glargine (LANTUS SOLOSTAR) 100 UNIT/ML Solostar Pen INJECT 25 UNITS UNDER THE SKIN EVERY MORNING      . lisinopril (PRINIVIL,ZESTRIL) 40 MG tablet TAKE 1 TABLET DAILY  90 tablet  2  . metFORMIN (GLUCOPHAGE) 1000 MG tablet TAKE 1 TABLET TWICE A DAY  180 tablet  2  . metoprolol succinate (TOPROL-XL) 25 MG 24 hr tablet TAKE 1 TABLET DAILY  90 tablet  2  . pioglitazone (ACTOS) 45 MG tablet Take 1 tablet (45 mg total) by mouth daily with breakfast.  90 tablet  3  . simvastatin (ZOCOR) 20 MG tablet Take 1 tablet (20 mg total) by mouth at bedtime.  90 tablet  3   No current facility-administered medications on file prior to visit.   No Known Allergies History   Social History  . Marital Status: Married    Spouse Name: N/A    Number of Children: N/A  . Years of Education: N/A   Occupational History  . Not on file.   Social History Main Topics  . Smoking status: Never Smoker   . Smokeless tobacco: Never Used  . Alcohol Use: No  . Drug Use: No  . Sexual Activity: Yes   Other Topics Concern  . Not on file   Social History Narrative  . No narrative on file      Review of Systems  All other systems reviewed and are negative.      Objective:   Physical Exam  Vitals reviewed. Constitutional: He appears well-developed and well-nourished. No distress.  Neck: Neck supple. No JVD present. No thyromegaly present.  Cardiovascular: Normal rate, regular rhythm, normal heart sounds and intact distal pulses.  Exam reveals no gallop and no friction rub.   No murmur heard. Pulmonary/Chest: Effort normal and breath sounds normal. No respiratory  distress. He has no wheezes. He has no rales. He exhibits no tenderness.  Abdominal: Soft. Bowel sounds  are normal. He exhibits no distension and no mass. There is no tenderness. There is no rebound and no guarding.  Musculoskeletal: He exhibits no edema.  Lymphadenopathy:    He has no cervical adenopathy.  Skin: He is not diaphoretic.          Assessment & Plan:  1. Type II or unspecified type diabetes mellitus without mention of complication, uncontrolled Hemoglobin A1c is controlled. Patient currently taking an aspirin. His diabetic foot exam and diabetic eye exam are up-to-date. His LDL cholesterol is at his goal. My biggest concern is his obesity. I recommended portion control and daily aerobic exercise. I would like to see at least 15-20 pound weight loss by his next check in 3 months.  2. Essential hypertension Blood pressures currently well controlled. Continue current medications at their present dosages.  3. HLD (hyperlipidemia)  cholesterol is well controlled. Continue simvastatin 20 mg by mouth daily.   Patient is also overdue for colonoscopy. Furthermore his hemoglobin has fallen from 14-12.5. Therefore we'll schedule the patient with GI for colonoscopy.

## 2014-06-25 ENCOUNTER — Encounter: Payer: Self-pay | Admitting: Internal Medicine

## 2014-06-26 ENCOUNTER — Encounter (INDEPENDENT_AMBULATORY_CARE_PROVIDER_SITE_OTHER): Payer: BC Managed Care – PPO | Admitting: Ophthalmology

## 2014-06-26 DIAGNOSIS — E11319 Type 2 diabetes mellitus with unspecified diabetic retinopathy without macular edema: Secondary | ICD-10-CM

## 2014-06-26 DIAGNOSIS — E11311 Type 2 diabetes mellitus with unspecified diabetic retinopathy with macular edema: Secondary | ICD-10-CM

## 2014-06-26 DIAGNOSIS — H35039 Hypertensive retinopathy, unspecified eye: Secondary | ICD-10-CM

## 2014-06-26 DIAGNOSIS — I10 Essential (primary) hypertension: Secondary | ICD-10-CM

## 2014-06-26 DIAGNOSIS — E1165 Type 2 diabetes mellitus with hyperglycemia: Secondary | ICD-10-CM

## 2014-06-26 DIAGNOSIS — E1139 Type 2 diabetes mellitus with other diabetic ophthalmic complication: Secondary | ICD-10-CM

## 2014-06-26 DIAGNOSIS — H43819 Vitreous degeneration, unspecified eye: Secondary | ICD-10-CM

## 2014-07-06 ENCOUNTER — Other Ambulatory Visit (INDEPENDENT_AMBULATORY_CARE_PROVIDER_SITE_OTHER): Payer: BC Managed Care – PPO | Admitting: Ophthalmology

## 2014-07-06 DIAGNOSIS — H3581 Retinal edema: Secondary | ICD-10-CM

## 2014-07-24 ENCOUNTER — Encounter (INDEPENDENT_AMBULATORY_CARE_PROVIDER_SITE_OTHER): Payer: BC Managed Care – PPO | Admitting: Ophthalmology

## 2014-07-24 DIAGNOSIS — E11319 Type 2 diabetes mellitus with unspecified diabetic retinopathy without macular edema: Secondary | ICD-10-CM

## 2014-07-24 DIAGNOSIS — H35039 Hypertensive retinopathy, unspecified eye: Secondary | ICD-10-CM

## 2014-07-24 DIAGNOSIS — I10 Essential (primary) hypertension: Secondary | ICD-10-CM

## 2014-07-24 DIAGNOSIS — E11311 Type 2 diabetes mellitus with unspecified diabetic retinopathy with macular edema: Secondary | ICD-10-CM

## 2014-07-24 DIAGNOSIS — E1139 Type 2 diabetes mellitus with other diabetic ophthalmic complication: Secondary | ICD-10-CM

## 2014-07-24 DIAGNOSIS — E1165 Type 2 diabetes mellitus with hyperglycemia: Secondary | ICD-10-CM

## 2014-07-24 DIAGNOSIS — H43819 Vitreous degeneration, unspecified eye: Secondary | ICD-10-CM

## 2014-07-26 ENCOUNTER — Encounter: Payer: Self-pay | Admitting: Gastroenterology

## 2014-08-03 ENCOUNTER — Ambulatory Visit (INDEPENDENT_AMBULATORY_CARE_PROVIDER_SITE_OTHER): Payer: BC Managed Care – PPO | Admitting: Family Medicine

## 2014-08-03 ENCOUNTER — Encounter: Payer: Self-pay | Admitting: Family Medicine

## 2014-08-03 ENCOUNTER — Telehealth: Payer: Self-pay | Admitting: *Deleted

## 2014-08-03 VITALS — BP 110/70 | HR 78 | Temp 98.1°F | Resp 18 | Ht 69.5 in | Wt 307.0 lb

## 2014-08-03 DIAGNOSIS — R5381 Other malaise: Secondary | ICD-10-CM

## 2014-08-03 DIAGNOSIS — R0600 Dyspnea, unspecified: Secondary | ICD-10-CM

## 2014-08-03 DIAGNOSIS — R0989 Other specified symptoms and signs involving the circulatory and respiratory systems: Secondary | ICD-10-CM

## 2014-08-03 DIAGNOSIS — R5383 Other fatigue: Principal | ICD-10-CM

## 2014-08-03 DIAGNOSIS — Z23 Encounter for immunization: Secondary | ICD-10-CM

## 2014-08-03 DIAGNOSIS — R0609 Other forms of dyspnea: Secondary | ICD-10-CM

## 2014-08-03 LAB — TESTOSTERONE: TESTOSTERONE: 311 ng/dL (ref 300–890)

## 2014-08-03 LAB — COMPLETE METABOLIC PANEL WITH GFR
ALT: 16 U/L (ref 0–53)
AST: 17 U/L (ref 0–37)
Albumin: 4.2 g/dL (ref 3.5–5.2)
Alkaline Phosphatase: 58 U/L (ref 39–117)
BUN: 14 mg/dL (ref 6–23)
CALCIUM: 9.7 mg/dL (ref 8.4–10.5)
CHLORIDE: 104 meq/L (ref 96–112)
CO2: 24 meq/L (ref 19–32)
Creat: 1 mg/dL (ref 0.50–1.35)
GFR, Est Non African American: 80 mL/min
GLUCOSE: 94 mg/dL (ref 70–99)
Potassium: 4.3 mEq/L (ref 3.5–5.3)
Sodium: 137 mEq/L (ref 135–145)
Total Bilirubin: 0.6 mg/dL (ref 0.2–1.2)
Total Protein: 6.6 g/dL (ref 6.0–8.3)

## 2014-08-03 LAB — CBC WITH DIFFERENTIAL/PLATELET
Basophils Absolute: 0 10*3/uL (ref 0.0–0.1)
Basophils Relative: 0 % (ref 0–1)
Eosinophils Absolute: 0.3 10*3/uL (ref 0.0–0.7)
Eosinophils Relative: 4 % (ref 0–5)
HEMATOCRIT: 37.4 % — AB (ref 39.0–52.0)
Hemoglobin: 12.5 g/dL — ABNORMAL LOW (ref 13.0–17.0)
LYMPHS PCT: 24 % (ref 12–46)
Lymphs Abs: 1.6 10*3/uL (ref 0.7–4.0)
MCH: 29.5 pg (ref 26.0–34.0)
MCHC: 33.4 g/dL (ref 30.0–36.0)
MCV: 88.2 fL (ref 78.0–100.0)
MONO ABS: 0.7 10*3/uL (ref 0.1–1.0)
MONOS PCT: 10 % (ref 3–12)
Neutro Abs: 4.1 10*3/uL (ref 1.7–7.7)
Neutrophils Relative %: 62 % (ref 43–77)
Platelets: 293 10*3/uL (ref 150–400)
RBC: 4.24 MIL/uL (ref 4.22–5.81)
RDW: 14.7 % (ref 11.5–15.5)
WBC: 6.6 10*3/uL (ref 4.0–10.5)

## 2014-08-03 LAB — VITAMIN B12: Vitamin B-12: 218 pg/mL (ref 211–911)

## 2014-08-03 LAB — TSH: TSH: 1.372 u[IU]/mL (ref 0.350–4.500)

## 2014-08-03 NOTE — Telephone Encounter (Signed)
Pt wife called back and is aware of appt

## 2014-08-03 NOTE — Progress Notes (Signed)
Subjective:    Patient ID: Erik Hancock, male    DOB: 13-Aug-1951, 63 y.o.   MRN: [id number]  HPI I last saw the patient in July. Since that time he develop worsening fatigue. The patient states he has no energy. He has very little exercise tolerance. He has severe fatigue and malaise. 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 He denies any chest pain at rest. He does report increasing dyspnea on exertion. He has some mild orthopnea. He's noticed some swelling in his legs as well. He has +1 edema in both legs to the level of his knee. Of note he is taking amlodipine as well as Actos. However the swelling only recently begun. He is 1 pound heavier than he was at his last office visit. Past Medical History  Diagnosis Date  . Diabetes mellitus   . Hypertension   . High cholesterol   . GERD (gastroesophageal reflux disease)   . Arthritis   . S/P primary angioplasty with coronary stent   . Left ureteral calculus   . Coronary artery disease [name]    [name]  . Sleep apnea     stopbang=5  . Prostatitis   . Nephrolithiasis   . Pulmonary nodule 2013    CT   Past Surgical History  Procedure Laterality Date  . Left ureteroscopic stone extraction  03-14-2003    X2  . Cervical scoville foraminotomy w/ excision of herniated nuclec pulposus  2009    C6 - 7  . Cardiac catheterization  JUNE 2003    PATENT LAD STENT/ BODERLINE OBSTRUCTIVE DISEASE POSTERIOR DESCENDING ARTERY/ NORMAL LVF  . Coronary angioplasty with stent placement  03-30-2006  DR EDMUNDS    90% LEFT CIRMCUMFLEX  --  DE STENT X1 /  95% POSTERIOR DESCENDING ARTERY--- DE X1 STENT/ PATENT LEFT ANTERIOR DESCENDING STENT/ LOW NORMAL LVF/  . Coronary angioplasty with stent placement  JAN 2003--  DR TURNER    DE STENT TO LAD  . Cystoscopy with left stent placement  04-04-2012  . Cystoscopy with  ureteroscopy  05/04/2012    Procedure: CYSTOSCOPY WITH URETEROSCOPY;  Surgeon: Milford Cage, MD;  Location: [facility];  Service: Urology;  Laterality: Left;      . Cystoscopy w/ ureteral stent placement  05/04/2012    Procedure: CYSTOSCOPY WITH STENT REPLACEMENT;  Surgeon: Milford Cage, MD;  Location: [facility];  Service: Urology;  Laterality: Left;  . Cystoscopy w/ retrogrades  05/04/2012    Procedure: CYSTOSCOPY WITH RETROGRADE PYELOGRAM;  Surgeon: Milford Cage, MD;  Location: [facility];  Service: Urology;  Laterality: Left;   Current Outpatient Prescriptions on File Prior to Visit  Medication Sig Dispense Refill  . amLODipine (NORVASC) 10 MG tablet Take 1 tablet (10 mg total) by mouth daily.  90 tablet  3  . aspirin 81 MG tablet Take 81 mg by mouth daily.      . clopidogrel (PLAVIX) 75 MG tablet TAKE 1 TABLET DAILY  90 tablet  2  . glipiZIDE (GLUCOTROL) 10 MG tablet TAKE 1 TABLET DAILY  90 tablet  2  . Insulin Glargine (LANTUS SOLOSTAR) 100 UNIT/ML Solostar Pen INJECT 25 UNITS UNDER THE SKIN EVERY MORNING      . lisinopril (PRINIVIL,ZESTRIL) 40 MG tablet TAKE 1 TABLET DAILY  90 tablet  2  . metFORMIN (GLUCOPHAGE) 1000  MG tablet TAKE 1 TABLET TWICE A DAY  180 tablet  2  . metoprolol succinate (TOPROL-XL) 25 MG 24 hr tablet TAKE 1 TABLET DAILY  90 tablet  2  . pioglitazone (ACTOS) 45 MG tablet Take 1 tablet (45 mg total) by mouth daily with breakfast.  90 tablet  3  . simvastatin (ZOCOR) 20 MG tablet Take 1 tablet (20 mg total) by mouth at bedtime.  90 tablet  3   No current facility-administered medications on file prior to visit.   No Known Allergies History   Social History  . Marital Status: Married    Spouse Name: N/A    Number of Children: N/A  . Years of Education: N/A   Occupational History  . Not on file.   Social History Main Topics  . Smoking status: Never Smoker   . Smokeless tobacco: Never Used  . Alcohol Use: No  . Drug Use: No  . Sexual  Activity: Yes   Other Topics Concern  . Not on file   Social History Narrative  . No narrative on file      Review of Systems  All other systems reviewed and are negative.      Objective:   Physical Exam  Vitals reviewed. Constitutional: He appears well-developed and well-nourished. No distress.  Neck: Neck supple. No JVD present. No thyromegaly present.  Cardiovascular: Normal rate, regular rhythm and normal heart sounds.   No murmur heard. Pulmonary/Chest: Effort normal and breath sounds normal. No respiratory distress. He has no wheezes. He has no rales. He exhibits no tenderness.  Abdominal: Soft. Bowel sounds are normal. He exhibits no distension. There is no tenderness. There is no rebound and no guarding.  Musculoskeletal: He exhibits edema.  Skin: He is not diaphoretic.          Assessment & Plan:  Other malaise and fatigue - Plan: CBC with Differential, COMPLETE METABOLIC PANEL WITH GFR, TSH, Testosterone, Vitamin B12, 2D Echocardiogram without contrast  Dyspnea - Plan: 2D Echocardiogram without contrast   Given his past medical history, I believe I need to rule out congestive heart failure first by obtaining a 2-D echocardiogram. I'll also check a CBC to rule out anemia as a cause of his fatigue. Check a TSH, testosterone level, and vitamin B12 level to evaluate for other possible causes of fatigue. If his echocardiogram is normal and his lab work is normal, will schedule patient for a split-level sleep study to evaluate for sleep apnea. The swelling that he is experiencing, if not due to CHF, is most likely due to amlodipine and Actos.  I will await the results of his echocardiogram and his lab work first.

## 2014-08-03 NOTE — Telephone Encounter (Signed)
Pt has appointment with [facility] on Sept 21 at1130am,  Lm on pt vm to return my call

## 2014-08-03 NOTE — Addendum Note (Signed)
Addended by: Legrand Rams B on: 08/03/2014 12:32 PM   Modules accepted: Orders

## 2014-08-13 ENCOUNTER — Ambulatory Visit (HOSPITAL_COMMUNITY): Payer: BC Managed Care – PPO | Attending: Family Medicine

## 2014-08-13 DIAGNOSIS — I10 Essential (primary) hypertension: Secondary | ICD-10-CM | POA: Diagnosis not present

## 2014-08-13 DIAGNOSIS — R0602 Shortness of breath: Secondary | ICD-10-CM

## 2014-08-13 DIAGNOSIS — R0600 Dyspnea, unspecified: Secondary | ICD-10-CM

## 2014-08-13 DIAGNOSIS — R5383 Other fatigue: Secondary | ICD-10-CM

## 2014-08-13 DIAGNOSIS — R5381 Other malaise: Secondary | ICD-10-CM

## 2014-08-13 DIAGNOSIS — E119 Type 2 diabetes mellitus without complications: Secondary | ICD-10-CM | POA: Insufficient documentation

## 2014-08-13 DIAGNOSIS — E785 Hyperlipidemia, unspecified: Secondary | ICD-10-CM | POA: Insufficient documentation

## 2014-08-13 NOTE — Progress Notes (Signed)
2D Echo completed. 08/13/2014 

## 2014-08-14 ENCOUNTER — Encounter (INDEPENDENT_AMBULATORY_CARE_PROVIDER_SITE_OTHER): Payer: BC Managed Care – PPO | Admitting: Ophthalmology

## 2014-08-15 ENCOUNTER — Telehealth: Payer: Self-pay | Admitting: Family Medicine

## 2014-08-15 DIAGNOSIS — G4719 Other hypersomnia: Secondary | ICD-10-CM

## 2014-08-15 DIAGNOSIS — R0989 Other specified symptoms and signs involving the circulatory and respiratory systems: Principal | ICD-10-CM

## 2014-08-15 DIAGNOSIS — R0609 Other forms of dyspnea: Secondary | ICD-10-CM

## 2014-08-15 NOTE — Telephone Encounter (Signed)
Pt aware of lab results and provider recommendations.  Referral for sleep study initiated

## 2014-08-15 NOTE — Telephone Encounter (Signed)
Message copied by Donne AnonM M on Wed Aug 15, 2014  4:14 PM ------      Message from: Lynnea Ferrier      Created: Tue Aug 14, 2014  7:06 AM       There is no evidence of CHF.  Swelling is likely due to the combo of actos and amlodipine.  I would still like to schedule him for a split level sleep study to rule out sleep apnea. ------

## 2014-08-29 ENCOUNTER — Ambulatory Visit: Payer: BC Managed Care – PPO | Admitting: Internal Medicine

## 2014-10-12 ENCOUNTER — Other Ambulatory Visit: Payer: Self-pay | Admitting: Family Medicine

## 2014-12-24 ENCOUNTER — Ambulatory Visit: Payer: BC Managed Care – PPO | Admitting: Family Medicine

## 2015-01-07 ENCOUNTER — Other Ambulatory Visit: Payer: Self-pay | Admitting: Family Medicine

## 2015-01-10 ENCOUNTER — Ambulatory Visit: Payer: BC Managed Care – PPO | Admitting: Family Medicine

## 2015-01-17 ENCOUNTER — Ambulatory Visit (INDEPENDENT_AMBULATORY_CARE_PROVIDER_SITE_OTHER): Payer: BC Managed Care – PPO | Admitting: Family Medicine

## 2015-01-17 ENCOUNTER — Other Ambulatory Visit: Payer: Self-pay | Admitting: Family Medicine

## 2015-01-17 ENCOUNTER — Encounter: Payer: Self-pay | Admitting: Family Medicine

## 2015-01-17 VITALS — BP 128/64 | HR 64 | Temp 97.9°F | Resp 18 | Ht 69.5 in | Wt 313.0 lb

## 2015-01-17 DIAGNOSIS — E1165 Type 2 diabetes mellitus with hyperglycemia: Secondary | ICD-10-CM

## 2015-01-17 DIAGNOSIS — I10 Essential (primary) hypertension: Secondary | ICD-10-CM

## 2015-01-17 DIAGNOSIS — IMO0002 Reserved for concepts with insufficient information to code with codable children: Secondary | ICD-10-CM

## 2015-01-17 DIAGNOSIS — G2581 Restless legs syndrome: Secondary | ICD-10-CM

## 2015-01-17 DIAGNOSIS — E785 Hyperlipidemia, unspecified: Secondary | ICD-10-CM

## 2015-01-17 LAB — CBC WITH DIFFERENTIAL/PLATELET
BASOS PCT: 1 % (ref 0–1)
Basophils Absolute: 0.1 10*3/uL (ref 0.0–0.1)
EOS ABS: 0.7 10*3/uL (ref 0.0–0.7)
Eosinophils Relative: 10 % — ABNORMAL HIGH (ref 0–5)
HEMATOCRIT: 39.1 % (ref 39.0–52.0)
Hemoglobin: 13.2 g/dL (ref 13.0–17.0)
Lymphocytes Relative: 29 % (ref 12–46)
Lymphs Abs: 2 10*3/uL (ref 0.7–4.0)
MCH: 30.1 pg (ref 26.0–34.0)
MCHC: 33.8 g/dL (ref 30.0–36.0)
MCV: 89.1 fL (ref 78.0–100.0)
MONO ABS: 0.8 10*3/uL (ref 0.1–1.0)
MPV: 8.1 fL — ABNORMAL LOW (ref 8.6–12.4)
Monocytes Relative: 12 % (ref 3–12)
NEUTROS PCT: 48 % (ref 43–77)
Neutro Abs: 3.3 10*3/uL (ref 1.7–7.7)
PLATELETS: 293 10*3/uL (ref 150–400)
RBC: 4.39 MIL/uL (ref 4.22–5.81)
RDW: 14.9 % (ref 11.5–15.5)
WBC: 6.9 10*3/uL (ref 4.0–10.5)

## 2015-01-17 LAB — COMPLETE METABOLIC PANEL WITH GFR
ALT: 17 U/L (ref 0–53)
AST: 19 U/L (ref 0–37)
Albumin: 4.2 g/dL (ref 3.5–5.2)
Alkaline Phosphatase: 62 U/L (ref 39–117)
BILIRUBIN TOTAL: 0.7 mg/dL (ref 0.2–1.2)
BUN: 14 mg/dL (ref 6–23)
CO2: 23 mEq/L (ref 19–32)
CREATININE: 1.08 mg/dL (ref 0.50–1.35)
Calcium: 9.4 mg/dL (ref 8.4–10.5)
Chloride: 105 mEq/L (ref 96–112)
GFR, Est African American: 84 mL/min
GFR, Est Non African American: 73 mL/min
GLUCOSE: 67 mg/dL — AB (ref 70–99)
Potassium: 4.2 mEq/L (ref 3.5–5.3)
Sodium: 139 mEq/L (ref 135–145)
Total Protein: 6.7 g/dL (ref 6.0–8.3)

## 2015-01-17 LAB — LIPID PANEL
Cholesterol: 123 mg/dL (ref 0–200)
HDL: 47 mg/dL (ref >=40)
LDL CALC: 58 mg/dL (ref 0–99)
TRIGLYCERIDES: 91 mg/dL (ref <150)
Total CHOL/HDL Ratio: 2.6 Ratio
VLDL: 18 mg/dL (ref 0–40)

## 2015-01-17 MED ORDER — PRAMIPEXOLE DIHYDROCHLORIDE 0.125 MG PO TABS: 0.2500 mg | ORAL_TABLET | Freq: Every day | ORAL | Status: DC

## 2015-01-17 NOTE — Progress Notes (Signed)
Subjective:    Patient ID: Erik Hancock, male    DOB: 02-Sep-1951, 64 y.o.   MRN: [id number]  HPI Patient is here today at my request follow-up for his multiple medical issues. He has a history of insulin-dependent type 2 diabetes mellitus that is poorly controlled. He has not followed up as scheduled with his eye doctor. He has a history of diabetic retinopathy with hemorrhage for which he was receiving intraocular injections. This was under the care of Dr. Ashley Royalty. He is currently on Lantus, glipizide, metformin, and Actos.  He states that his fasting blood sugar is always under 120 and typically 70-100. However he is not taking any postprandial sugars or checking his sugars at night. He denies any neuropathy in his legs. He denies any polyuria, polydipsia, or blurred vision. He is not following any specific diet. He is not exercising regularly. His blood pressures well controlled today. He denies any chest pain shortness of breath or dyspnea on exertion. He denies any myalgias or right upper quadrant pain on the simvastatin. He does complain of aching pain in both legs that occur at night which improve if he moves his legs. The pain is not present during the daytime when he is active. Past Medical History  Diagnosis Date  . Diabetes mellitus   . Hypertension   . High cholesterol   . GERD (gastroesophageal reflux disease)   . Arthritis   . S/P primary angioplasty with coronary stent   . Left ureteral calculus   . Coronary artery disease [name]    [name]  . Sleep apnea     stopbang=5  . Prostatitis   . Nephrolithiasis   . Pulmonary nodule 2013    CT   Past Surgical History  Procedure Laterality Date  . Left ureteroscopic stone extraction  03-14-2003    X2  . Cervical scoville foraminotomy w/ excision of herniated nuclec pulposus  2009    C6 - 7  . Cardiac catheterization  JUNE 2003    PATENT LAD  STENT/ BODERLINE OBSTRUCTIVE DISEASE POSTERIOR DESCENDING ARTERY/ NORMAL LVF  . Coronary angioplasty with stent placement  03-30-2006  DR EDMUNDS    90% LEFT CIRMCUMFLEX  --  DE STENT X1 /  95% POSTERIOR DESCENDING ARTERY--- DE X1 STENT/ PATENT LEFT ANTERIOR DESCENDING STENT/ LOW NORMAL LVF/  . Coronary angioplasty with stent placement  JAN 2003--  DR TURNER    DE STENT TO LAD  . Cystoscopy with left stent placement  04-04-2012  . Cystoscopy with ureteroscopy  05/04/2012    Procedure: CYSTOSCOPY WITH URETEROSCOPY;  Surgeon: Milford Cage, MD;  Location: [facility];  Service: Urology;  Laterality: Left;      . Cystoscopy w/ ureteral stent placement  05/04/2012    Procedure: CYSTOSCOPY WITH STENT REPLACEMENT;  Surgeon: Milford Cage, MD;  Location: [facility];  Service: Urology;  Laterality: Left;  . Cystoscopy w/ retrogrades  05/04/2012    Procedure: CYSTOSCOPY WITH RETROGRADE PYELOGRAM;  Surgeon: Milford Cage, MD;  Location: [facility];  Service: Urology;  Laterality: Left;   Current Outpatient Prescriptions on File Prior to Visit  Medication Sig Dispense Refill  . amLODipine (NORVASC) 10 MG tablet TAKE 1 TABLET DAILY 90 tablet 0  . aspirin 81 MG tablet Take 81 mg by mouth daily.    . clopidogrel (PLAVIX) 75 MG tablet TAKE 1 TABLET DAILY 90 tablet 2  .  glipiZIDE (GLUCOTROL) 10 MG tablet TAKE 1 TABLET DAILY 90 tablet 2  . Insulin Glargine (LANTUS SOLOSTAR) 100 UNIT/ML Solostar Pen INJECT 25 UNITS UNDER THE SKIN EVERY MORNING    . lisinopril (PRINIVIL,ZESTRIL) 40 MG tablet TAKE 1 TABLET DAILY 90 tablet 2  . metFORMIN (GLUCOPHAGE) 1000 MG tablet TAKE 1 TABLET TWICE A DAY 180 tablet 2  . metoprolol succinate (TOPROL-XL) 25 MG 24 hr tablet TAKE 1 TABLET DAILY 90 tablet 2  . pioglitazone (ACTOS) 45 MG tablet TAKE 1 TABLET DAILY WITH BREAKFAST 90 tablet 0  . simvastatin (ZOCOR) 20 MG tablet Take 1 tablet (20 mg total) by mouth at bedtime. 90 tablet 3   No current facility-administered  medications on file prior to visit.   No Known Allergies History   Social History  . Marital Status: Married    Spouse Name: N/A  . Number of Children: N/A  . Years of Education: N/A   Occupational History  . Not on file.   Social History Main Topics  . Smoking status: Never Smoker   . Smokeless tobacco: Never Used  . Alcohol Use: No  . Drug Use: No  . Sexual Activity: Yes   Other Topics Concern  . Not on file   Social History Narrative      Review of Systems  All other systems reviewed and are negative.      Objective:   Physical Exam  Constitutional: He appears well-developed and well-nourished.  Neck: Neck supple. No JVD present. No thyromegaly present.  Cardiovascular: Normal rate, regular rhythm, normal heart sounds and intact distal pulses.   No murmur heard. Pulmonary/Chest: Effort normal and breath sounds normal. No respiratory distress. He has no wheezes. He has no rales. He exhibits no tenderness.  Abdominal: Soft. Bowel sounds are normal. He exhibits no distension and no mass. There is no tenderness. There is no rebound and no guarding.  Musculoskeletal: He exhibits no edema.  Lymphadenopathy:    He has no cervical adenopathy.  Skin: No rash noted. No erythema.  Vitals reviewed.         Assessment & Plan:  HLD (hyperlipidemia) - Plan: COMPLETE METABOLIC PANEL WITH GFR, CBC with Differential/Platelet, Hemoglobin A1c, Lipid panel, Microalbumin, urine  Essential hypertension  Diabetes mellitus type II, uncontrolled - Plan: Hemoglobin A1c  RLS (restless legs syndrome) - Plan: pramipexole (MIRAPEX) 0.125 MG tablet  Patient's blood pressures well controlled. I will make no changes in his blood pressure medication. I will check a fasting lipid panel. Goal LDL cholesterol is less than 70 given his history of coronary artery disease. I will check a hemoglobin A1c. Goal hemoglobin A1c is less than 7. The patient's sugars that he is reporting are good I  strongly encouraged the patient to begin exercising regularly and try to lose weight as his morbid obesity increases his risk of morbidity and mortality.. His symptoms sound like restless leg syndrome. Therefore I'll start the patient on Mirapex and increased to 0.25 mg by mouth daily at bedtime to see if his neuropathic pain in his legs at night will improve. I also recommended the patient follow-up with his eye doctor.

## 2015-01-18 LAB — HEMOGLOBIN A1C
Hgb A1c MFr Bld: 6.6 % — ABNORMAL HIGH (ref <5.7)
MEAN PLASMA GLUCOSE: 143 mg/dL — AB (ref <117)

## 2015-01-18 LAB — MICROALBUMIN, URINE: MICROALB UR: 1.5 mg/dL (ref <2.0)

## 2015-01-21 ENCOUNTER — Encounter: Payer: Self-pay | Admitting: *Deleted

## 2015-03-15 ENCOUNTER — Other Ambulatory Visit: Payer: Self-pay | Admitting: Family Medicine

## 2015-03-15 NOTE — Telephone Encounter (Signed)
Medication refilled per protocol. 

## 2015-04-23 ENCOUNTER — Other Ambulatory Visit: Payer: Self-pay | Admitting: Family Medicine

## 2015-09-23 ENCOUNTER — Encounter: Payer: Self-pay | Admitting: Family Medicine

## 2015-09-23 ENCOUNTER — Other Ambulatory Visit: Payer: Self-pay | Admitting: Family Medicine

## 2015-10-15 ENCOUNTER — Other Ambulatory Visit: Payer: Self-pay | Admitting: Family Medicine

## 2015-10-15 MED ORDER — INSULIN GLARGINE 100 UNIT/ML SOLOSTAR PEN: PEN_INJECTOR | SUBCUTANEOUS | Status: DC

## 2015-10-15 NOTE — Telephone Encounter (Signed)
Medication called/sent to requested pharmacy  

## 2015-10-22 ENCOUNTER — Encounter: Payer: Self-pay | Admitting: Family Medicine

## 2015-10-22 ENCOUNTER — Ambulatory Visit (INDEPENDENT_AMBULATORY_CARE_PROVIDER_SITE_OTHER): Payer: BC Managed Care – PPO | Admitting: Family Medicine

## 2015-10-22 VITALS — BP 119/64 | HR 70 | Temp 98.0°F | Resp 20 | Ht 69.5 in | Wt 320.0 lb

## 2015-10-22 DIAGNOSIS — E785 Hyperlipidemia, unspecified: Secondary | ICD-10-CM

## 2015-10-22 DIAGNOSIS — I2510 Atherosclerotic heart disease of native coronary artery without angina pectoris: Secondary | ICD-10-CM | POA: Diagnosis not present

## 2015-10-22 DIAGNOSIS — Z23 Encounter for immunization: Secondary | ICD-10-CM

## 2015-10-22 DIAGNOSIS — E119 Type 2 diabetes mellitus without complications: Secondary | ICD-10-CM | POA: Diagnosis not present

## 2015-10-22 DIAGNOSIS — I10 Essential (primary) hypertension: Secondary | ICD-10-CM | POA: Diagnosis not present

## 2015-10-22 DIAGNOSIS — Z125 Encounter for screening for malignant neoplasm of prostate: Secondary | ICD-10-CM

## 2015-10-22 DIAGNOSIS — Z794 Long term (current) use of insulin: Secondary | ICD-10-CM

## 2015-10-22 LAB — CBC WITH DIFFERENTIAL/PLATELET
BASOS ABS: 0 10*3/uL (ref 0.0–0.1)
Basophils Relative: 0 % (ref 0–1)
EOS ABS: 0.4 10*3/uL (ref 0.0–0.7)
Eosinophils Relative: 6 % — ABNORMAL HIGH (ref 0–5)
HCT: 42.1 % (ref 39.0–52.0)
HEMOGLOBIN: 13.6 g/dL (ref 13.0–17.0)
LYMPHS ABS: 2.1 10*3/uL (ref 0.7–4.0)
LYMPHS PCT: 28 % (ref 12–46)
MCH: 29.4 pg (ref 26.0–34.0)
MCHC: 32.3 g/dL (ref 30.0–36.0)
MCV: 90.9 fL (ref 78.0–100.0)
MONOS PCT: 8 % (ref 3–12)
MPV: 8.5 fL — AB (ref 8.6–12.4)
Monocytes Absolute: 0.6 10*3/uL (ref 0.1–1.0)
NEUTROS ABS: 4.3 10*3/uL (ref 1.7–7.7)
NEUTROS PCT: 58 % (ref 43–77)
PLATELETS: 301 10*3/uL (ref 150–400)
RBC: 4.63 MIL/uL (ref 4.22–5.81)
RDW: 14.8 % (ref 11.5–15.5)
WBC: 7.4 10*3/uL (ref 4.0–10.5)

## 2015-10-22 LAB — LIPID PANEL
Cholesterol: 137 mg/dL (ref 125–200)
HDL: 52 mg/dL (ref >=40)
LDL Cholesterol: 58 mg/dL (ref <130)
TRIGLYCERIDES: 135 mg/dL (ref <150)
Total CHOL/HDL Ratio: 2.6 Ratio (ref <=5.0)
VLDL: 27 mg/dL (ref <30)

## 2015-10-22 LAB — COMPLETE METABOLIC PANEL WITH GFR
ALBUMIN: 4.6 g/dL (ref 3.6–5.1)
ALK PHOS: 75 U/L (ref 40–115)
ALT: 23 U/L (ref 9–46)
AST: 30 U/L (ref 10–35)
BILIRUBIN TOTAL: 0.6 mg/dL (ref 0.2–1.2)
BUN: 17 mg/dL (ref 7–25)
CO2: 24 mmol/L (ref 20–31)
CREATININE: 1.19 mg/dL (ref 0.70–1.25)
Calcium: 9.8 mg/dL (ref 8.6–10.3)
Chloride: 105 mmol/L (ref 98–110)
GFR, EST AFRICAN AMERICAN: 75 mL/min (ref >=60)
GFR, EST NON AFRICAN AMERICAN: 65 mL/min (ref >=60)
GLUCOSE: 118 mg/dL — AB (ref 70–99)
Potassium: 4.8 mmol/L (ref 3.5–5.3)
Sodium: 139 mmol/L (ref 135–146)
TOTAL PROTEIN: 7.1 g/dL (ref 6.1–8.1)

## 2015-10-22 LAB — HEMOGLOBIN A1C
Hgb A1c MFr Bld: 6.2 % — ABNORMAL HIGH (ref <5.7)
MEAN PLASMA GLUCOSE: 131 mg/dL — AB (ref <117)

## 2015-10-22 MED ORDER — ZOSTER VACCINE LIVE 19400 UNT/0.65ML ~~LOC~~ SOLR: 0.6500 mL | Freq: Once | SUBCUTANEOUS | Status: DC

## 2015-10-22 MED ORDER — CANAGLIFLOZIN 300 MG PO TABS
300.0000 mg | ORAL_TABLET | Freq: Every day | ORAL | Status: DC
Start: 2015-10-22 — End: 2016-01-07

## 2015-10-22 NOTE — Progress Notes (Signed)
Subjective:    Patient ID: Erik Hancock, male    DOB: 1951-01-23, 64 y.o.   MRN: [id number]  HPI 01/17/15 Patient is here today at my request follow-up for his multiple medical issues. He has a history of insulin-dependent type 2 diabetes mellitus that is poorly controlled. He has not followed up as scheduled with his eye doctor. He has a history of diabetic retinopathy with hemorrhage for which he was receiving intraocular injections. This was under the care of Dr. Ashley Royalty. He is currently on Lantus, glipizide, metformin, and Actos.  He states that his fasting blood sugar is always under 120 and typically 70-100. However he is not taking any postprandial sugars or checking his sugars at night. He denies any neuropathy in his legs. He denies any polyuria, polydipsia, or blurred vision. He is not following any specific diet. He is not exercising regularly. His blood pressures well controlled today. He denies any chest pain shortness of breath or dyspnea on exertion. He denies any myalgias or right upper quadrant pain on the simvastatin. He does complain of aching pain in both legs that occur at night which improve if he moves his legs. The pain is not present during the daytime when he is active.  At that time, my plan was: Patient's blood pressure is well controlled. I will make no changes in his blood pressure medication. I will check a fasting lipid panel. Goal LDL cholesterol is less than 70 given his history of coronary artery disease. I will check a hemoglobin A1c. Goal hemoglobin A1c is less than 7. The patient's sugars that he are reporting are good. I strongly encouraged the patient to begin exercising regularly and try to lose weight as his morbid obesity increases his risk of morbidity and mortality. His symptoms sound like restless leg syndrome. Therefore I'll start the patient on Mirapex and increased to 0.25 mg by mouth daily at bedtime to see if his neuropathic pain in his legs at night will  improve. I also recommended the patient follow-up with his eye doctor.  10/22/15 He is here for follow up.  He has gained 7 pounds since his last office visit. He is not exercising. He is not watching his diet. His fasting blood sugars are under 130. His two-hour postprandial sugars are under 160 when he checks them. He has very rare but occasional hypoglycemic episodes into the 60s. He denies any chest pain shortness of breath or dyspnea on exertion. He denies any myalgias or right upper quadrant pain on the simvastatin. He denies any claudication symptoms in the extremities. He denies any vision changes. Past Medical History  Diagnosis Date  . Diabetes mellitus   . Hypertension   . High cholesterol   . GERD (gastroesophageal reflux disease)   . Arthritis   . S/P primary angioplasty with coronary stent   . Left ureteral calculus   . Coronary artery disease [name]    [name]  . Sleep apnea     stopbang=5  . Prostatitis   . Nephrolithiasis   . Pulmonary nodule 2013    CT   Past Surgical History  Procedure Laterality Date  . Left ureteroscopic stone extraction  03-14-2003    X2  . Cervical scoville foraminotomy w/ excision of herniated nuclec pulposus  2009    C6 - 7  . Cardiac catheterization  JUNE 2003    PATENT LAD STENT/  BODERLINE OBSTRUCTIVE DISEASE POSTERIOR DESCENDING ARTERY/ NORMAL LVF  . Coronary angioplasty with stent placement  03-30-2006  DR EDMUNDS    90% LEFT CIRMCUMFLEX  --  DE STENT X1 /  95% POSTERIOR DESCENDING ARTERY--- DE X1 STENT/ PATENT LEFT ANTERIOR DESCENDING STENT/ LOW NORMAL LVF/  . Coronary angioplasty with stent placement  JAN 2003--  DR TURNER    DE STENT TO LAD  . Cystoscopy with left stent placement  04-04-2012  . Cystoscopy with ureteroscopy  05/04/2012    Procedure: CYSTOSCOPY WITH URETEROSCOPY;  Surgeon: Milford Cage, MD;  Location: [facility];  Service:  Urology;  Laterality: Left;      . Cystoscopy w/ ureteral stent placement  05/04/2012    Procedure: CYSTOSCOPY WITH STENT REPLACEMENT;  Surgeon: Milford Cage, MD;  Location: [facility];  Service: Urology;  Laterality: Left;  . Cystoscopy w/ retrogrades  05/04/2012    Procedure: CYSTOSCOPY WITH RETROGRADE PYELOGRAM;  Surgeon: Milford Cage, MD;  Location: [facility];  Service: Urology;  Laterality: Left;   Current Outpatient Prescriptions on File Prior to Visit  Medication Sig Dispense Refill  . ACCU-CHEK COMPACT PLUS test strip USE TO CHECK THREE TIMES A DAY 300 each 2  . amLODipine (NORVASC) 10 MG tablet TAKE 1 TABLET DAILY 90 tablet 1  . aspirin 81 MG tablet Take 81 mg by mouth daily.    . clopidogrel (PLAVIX) 75 MG tablet TAKE 1 TABLET DAILY 90 tablet 0  . Cyanocobalamin (VITAMIN B 12 PO) Take by mouth.    [name] glipiZIDE (GLUCOTROL) 10 MG tablet TAKE 1 TABLET DAILY 90 tablet 0  . Insulin Glargine (LANTUS SOLOSTAR) 100 UNIT/ML Solostar Pen INJECT 25 UNITS UNDER THE SKIN EVERY MORNING 45 mL 0  . lisinopril (PRINIVIL,ZESTRIL) 40 MG tablet TAKE 1 TABLET DAILY 90 tablet 0  . metFORMIN (GLUCOPHAGE) 1000 MG tablet TAKE 1 TABLET TWICE A DAY 180 tablet 0  . metoprolol succinate (TOPROL-XL) 25 MG 24 hr tablet TAKE 1 TABLET DAILY 90 tablet 0  . pioglitazone (ACTOS) 45 MG tablet TAKE 1 TABLET DAILY WITH BREAKFAST 90 tablet 1  . pramipexole (MIRAPEX) 0.125 MG tablet Take 2 tablets (0.25 mg total) by mouth at bedtime. 60 tablet 3  . simvastatin (ZOCOR) 20 MG tablet TAKE 1 TABLET AT BEDTIME 90 tablet 1   No current facility-administered medications on file prior to visit.   No Known Allergies Social History   Social History  . Marital Status: Married    Spouse Name: N/A  . Number of Children: N/A  . Years of Education: N/A   Occupational History  . Not on file.   Social History Main Topics  . Smoking status: Never Smoker   . Smokeless tobacco: Never Used  . Alcohol Use: No  . Drug  Use: No  . Sexual Activity: Yes   Other Topics Concern  . Not on file   Social History Narrative      Review of Systems  All other systems reviewed and are negative.      Objective:   Physical Exam  Constitutional: He appears well-developed and well-nourished.  Neck: Neck supple. No JVD present. No thyromegaly present.  Cardiovascular: Normal rate, regular rhythm, normal heart sounds and intact distal pulses.   No murmur heard. Pulmonary/Chest: Effort normal and breath sounds normal. No respiratory distress. He has no wheezes. He has no rales. He exhibits no tenderness.  Abdominal: Soft. Bowel sounds are normal. He exhibits no distension and no mass. There is no  tenderness. There is no rebound and no guarding.  Musculoskeletal: He exhibits no edema.  Lymphadenopathy:    He has no cervical adenopathy.  Skin: No rash noted. No erythema.  Vitals reviewed.         Assessment & Plan:  HLD (hyperlipidemia) - Plan: COMPLETE METABOLIC PANEL WITH GFR, Lipid panel  Essential hypertension - Plan: COMPLETE METABOLIC PANEL WITH GFR, Lipid panel  Controlled type 2 diabetes mellitus without complication, with long-term current use of insulin (HCC) - Plan: CBC with Differential/Platelet, COMPLETE METABOLIC PANEL WITH GFR, Lipid panel, Hemoglobin A1c, Microalbumin, urine, canagliflozin (INVOKANA) 300 MG TABS tablet  ASCVD (arteriosclerotic cardiovascular disease) - Plan: CBC with Differential/Platelet, COMPLETE METABOLIC PANEL WITH GFR, Lipid panel  Prostate cancer screening - Plan: PSA  Patient will receive his flu shot today. The remainder of his immunizations are up-to-date. While I'm checking blood work I will also check a PSA for prostate cancer screening. At the present time there are no signs or symptoms of unstable angina. I did recommend that the patient schedule an annual follow-up with his cardiologist. I will check a hemoglobin A1c, goal hemoglobin A1c is less than 7. His  blood pressures currently well controlled and therefore I'll make no changes in his blood pressure medication. I will check a fasting lipid panel. Due to his history of cardiovascular disease, his goal LDL cholesterol is less than 70. I really want to focus on weight loss with this patient I have strongly encouraged her to begin exercising and gradually increase to 30 minutes a day 5 days a week. I strongly encouraged him to focus on his diet and calorie restriction. I will discontinue Actos and switch the patient to invokana 300 mg poqday to try to facilitate weight loss.

## 2015-10-22 NOTE — Addendum Note (Signed)
Addended by: Legrand RamsWILLIS, SANDY B on: 10/22/2015 01:36 PM   Modules accepted: Orders

## 2015-10-23 LAB — MICROALBUMIN, URINE: MICROALB UR: 1.3 mg/dL

## 2015-10-23 LAB — PSA: PSA: 0.8 ng/mL (ref <=4.00)

## 2015-10-24 ENCOUNTER — Encounter: Payer: Self-pay | Admitting: Family Medicine

## 2015-10-30 ENCOUNTER — Encounter (INDEPENDENT_AMBULATORY_CARE_PROVIDER_SITE_OTHER): Payer: BC Managed Care – PPO | Admitting: Ophthalmology

## 2015-10-30 DIAGNOSIS — E113313 Type 2 diabetes mellitus with moderate nonproliferative diabetic retinopathy with macular edema, bilateral: Secondary | ICD-10-CM

## 2015-10-30 DIAGNOSIS — H2513 Age-related nuclear cataract, bilateral: Secondary | ICD-10-CM

## 2015-10-30 DIAGNOSIS — E11311 Type 2 diabetes mellitus with unspecified diabetic retinopathy with macular edema: Secondary | ICD-10-CM | POA: Diagnosis not present

## 2015-10-30 DIAGNOSIS — H43813 Vitreous degeneration, bilateral: Secondary | ICD-10-CM | POA: Diagnosis not present

## 2015-10-30 DIAGNOSIS — H35033 Hypertensive retinopathy, bilateral: Secondary | ICD-10-CM

## 2015-10-30 DIAGNOSIS — I10 Essential (primary) hypertension: Secondary | ICD-10-CM

## 2015-11-24 HISTORY — PX: CATARACT EXTRACTION W/ INTRAOCULAR LENS  IMPLANT, BILATERAL: SHX1307

## 2015-11-27 ENCOUNTER — Encounter (INDEPENDENT_AMBULATORY_CARE_PROVIDER_SITE_OTHER): Payer: BC Managed Care – PPO | Admitting: Ophthalmology

## 2015-11-27 DIAGNOSIS — H43813 Vitreous degeneration, bilateral: Secondary | ICD-10-CM | POA: Diagnosis not present

## 2015-11-27 DIAGNOSIS — I10 Essential (primary) hypertension: Secondary | ICD-10-CM | POA: Diagnosis not present

## 2015-11-27 DIAGNOSIS — E113313 Type 2 diabetes mellitus with moderate nonproliferative diabetic retinopathy with macular edema, bilateral: Secondary | ICD-10-CM

## 2015-11-27 DIAGNOSIS — H35033 Hypertensive retinopathy, bilateral: Secondary | ICD-10-CM

## 2015-11-27 DIAGNOSIS — H2513 Age-related nuclear cataract, bilateral: Secondary | ICD-10-CM

## 2015-11-27 DIAGNOSIS — E11311 Type 2 diabetes mellitus with unspecified diabetic retinopathy with macular edema: Secondary | ICD-10-CM | POA: Diagnosis not present

## 2015-12-03 ENCOUNTER — Telehealth: Payer: Self-pay | Admitting: Family Medicine

## 2015-12-03 MED ORDER — AMLODIPINE BESYLATE 10 MG PO TABS: 10.0000 mg | ORAL_TABLET | Freq: Every day | ORAL | Status: DC

## 2015-12-03 MED ORDER — INSULIN PEN NEEDLE 31G X 8 MM MISC
Status: DC
Start: 2015-12-03 — End: 2017-12-15

## 2015-12-03 NOTE — Telephone Encounter (Addendum)
New pharmacy [facility]  [address]  And needs refills on his ultra fine needles, and also his amlidopine  [phone]

## 2015-12-03 NOTE — Telephone Encounter (Signed)
Medication called/sent to requested pharmacy  

## 2015-12-18 ENCOUNTER — Telehealth: Payer: Self-pay | Admitting: *Deleted

## 2015-12-18 NOTE — Telephone Encounter (Signed)
Pt wife called stating pt is c/o Insulin Pen Needle 31G X 8 MM MISC is too short states it just runs out of his skin says its about 1/4" shorter than normal  [id number] [place], [place] - [address]

## 2015-12-19 NOTE — Telephone Encounter (Signed)
LMTRC

## 2015-12-24 NOTE — Telephone Encounter (Signed)
[phone] called in stating all medication needs to be called into [facility] [address]. He no longer gets his medication through mail order. I asked for the names of the medication and she said that Dr. Caren Macadam nurse would know the names.

## 2015-12-25 ENCOUNTER — Encounter (INDEPENDENT_AMBULATORY_CARE_PROVIDER_SITE_OTHER): Payer: BC Managed Care – PPO | Admitting: Ophthalmology

## 2015-12-25 DIAGNOSIS — H43813 Vitreous degeneration, bilateral: Secondary | ICD-10-CM | POA: Diagnosis not present

## 2015-12-25 DIAGNOSIS — E113313 Type 2 diabetes mellitus with moderate nonproliferative diabetic retinopathy with macular edema, bilateral: Secondary | ICD-10-CM | POA: Diagnosis not present

## 2015-12-25 DIAGNOSIS — I10 Essential (primary) hypertension: Secondary | ICD-10-CM

## 2015-12-25 DIAGNOSIS — E11311 Type 2 diabetes mellitus with unspecified diabetic retinopathy with macular edema: Secondary | ICD-10-CM

## 2015-12-25 DIAGNOSIS — H35033 Hypertensive retinopathy, bilateral: Secondary | ICD-10-CM

## 2015-12-25 DIAGNOSIS — H2513 Age-related nuclear cataract, bilateral: Secondary | ICD-10-CM

## 2015-12-25 NOTE — Telephone Encounter (Signed)
LMTRC

## 2015-12-31 NOTE — Telephone Encounter (Signed)
lmtrc

## 2016-01-02 ENCOUNTER — Telehealth: Payer: Self-pay | Admitting: *Deleted

## 2016-01-02 ENCOUNTER — Other Ambulatory Visit: Payer: Self-pay | Admitting: *Deleted

## 2016-01-02 DIAGNOSIS — E119 Type 2 diabetes mellitus without complications: Secondary | ICD-10-CM

## 2016-01-02 DIAGNOSIS — Z794 Long term (current) use of insulin: Principal | ICD-10-CM

## 2016-01-02 MED ORDER — METOPROLOL SUCCINATE ER 25 MG PO TB24: 25.0000 mg | ORAL_TABLET | Freq: Every day | ORAL | Status: DC

## 2016-01-02 MED ORDER — SIMVASTATIN 20 MG PO TABS: 20.0000 mg | ORAL_TABLET | Freq: Every day | ORAL | Status: DC

## 2016-01-02 MED ORDER — METFORMIN HCL 1000 MG PO TABS: 1000.0000 mg | ORAL_TABLET | Freq: Two times a day (BID) | ORAL | Status: DC

## 2016-01-02 MED ORDER — LISINOPRIL 40 MG PO TABS: 40.0000 mg | ORAL_TABLET | Freq: Every day | ORAL | Status: DC

## 2016-01-02 MED ORDER — GLIPIZIDE 10 MG PO TABS: 10.0000 mg | ORAL_TABLET | Freq: Every day | ORAL | Status: DC

## 2016-01-02 MED ORDER — CLOPIDOGREL BISULFATE 75 MG PO TABS: 75.0000 mg | ORAL_TABLET | Freq: Every day | ORAL | Status: DC

## 2016-01-02 MED ORDER — AMLODIPINE BESYLATE 10 MG PO TABS: 10.0000 mg | ORAL_TABLET | Freq: Every day | ORAL | Status: DC

## 2016-01-02 MED ORDER — INSULIN GLARGINE 100 UNIT/ML SOLOSTAR PEN: PEN_INJECTOR | SUBCUTANEOUS | Status: DC

## 2016-01-02 NOTE — Telephone Encounter (Signed)
Received call from [facility] in regards to medication refill.   Requested to have routine medications refilled.   Refill appropriate and filled per protocol.

## 2016-01-02 NOTE — Telephone Encounter (Signed)
Received request from pharmacy for PA on Lanyus solostar  PA submitted.   Dx: E11.65

## 2016-01-02 NOTE — Telephone Encounter (Signed)
Received request from pharmacy for PA on Invokana.  PA submitted.   Dx: E11.65. 

## 2016-01-03 NOTE — Telephone Encounter (Signed)
Received PA determination.  ° °PA approved.  ° °Pharmacy made aware.  °

## 2016-01-07 MED ORDER — CANAGLIFLOZIN 300 MG PO TABS: 300.0000 mg | ORAL_TABLET | Freq: Every day | ORAL | Status: DC

## 2016-01-07 NOTE — Telephone Encounter (Signed)
Received PA determination.  ° °PA approved.  ° °Pharmacy made aware.  °

## 2016-01-14 ENCOUNTER — Encounter (INDEPENDENT_AMBULATORY_CARE_PROVIDER_SITE_OTHER): Payer: BC Managed Care – PPO | Admitting: Ophthalmology

## 2016-01-14 DIAGNOSIS — I10 Essential (primary) hypertension: Secondary | ICD-10-CM | POA: Diagnosis not present

## 2016-01-14 DIAGNOSIS — H35033 Hypertensive retinopathy, bilateral: Secondary | ICD-10-CM | POA: Diagnosis not present

## 2016-01-14 DIAGNOSIS — E11311 Type 2 diabetes mellitus with unspecified diabetic retinopathy with macular edema: Secondary | ICD-10-CM | POA: Diagnosis not present

## 2016-01-14 DIAGNOSIS — E113313 Type 2 diabetes mellitus with moderate nonproliferative diabetic retinopathy with macular edema, bilateral: Secondary | ICD-10-CM | POA: Diagnosis not present

## 2016-01-14 DIAGNOSIS — H43813 Vitreous degeneration, bilateral: Secondary | ICD-10-CM | POA: Diagnosis not present

## 2016-01-14 DIAGNOSIS — H2513 Age-related nuclear cataract, bilateral: Secondary | ICD-10-CM

## 2016-01-30 ENCOUNTER — Ambulatory Visit (INDEPENDENT_AMBULATORY_CARE_PROVIDER_SITE_OTHER): Payer: BC Managed Care – PPO | Admitting: Family Medicine

## 2016-01-30 ENCOUNTER — Encounter: Payer: Self-pay | Admitting: Family Medicine

## 2016-01-30 ENCOUNTER — Telehealth: Payer: Self-pay | Admitting: *Deleted

## 2016-01-30 VITALS — BP 126/78 | HR 88 | Temp 98.0°F | Resp 24 | Ht 69.5 in | Wt 296.0 lb

## 2016-01-30 DIAGNOSIS — R0789 Other chest pain: Secondary | ICD-10-CM | POA: Diagnosis not present

## 2016-01-30 DIAGNOSIS — R634 Abnormal weight loss: Secondary | ICD-10-CM | POA: Diagnosis not present

## 2016-01-30 DIAGNOSIS — R1013 Epigastric pain: Secondary | ICD-10-CM | POA: Diagnosis not present

## 2016-01-30 LAB — CBC W/MCH & 3 PART DIFF
HEMATOCRIT: 44.1 % (ref 39.0–52.0)
HEMOGLOBIN: 15 g/dL (ref 13.0–17.0)
Lymphocytes Relative: 26 % (ref 12–46)
Lymphs Abs: 2.1 10*3/uL (ref 0.7–4.0)
MCH: 30.5 pg (ref 26.0–34.0)
MCHC: 34 g/dL (ref 30.0–36.0)
MCV: 89.6 fL (ref 78.0–100.0)
NEUTROS ABS: 5 10*3/uL (ref 1.7–7.7)
Neutrophils Relative %: 63 % (ref 43–77)
PLATELETS: 317 10*3/uL (ref 150–400)
RBC: 4.92 MIL/uL (ref 4.22–5.81)
RDW: 14 % (ref 11.5–15.5)
WBC mixed population %: 11 % (ref 3–18)
WBC: 8 10*3/uL (ref 4.0–10.5)
WBCMIX: 0.9 10*3/uL (ref 0.1–1.8)

## 2016-01-30 LAB — COMPLETE METABOLIC PANEL WITH GFR
ALT: 26 U/L (ref 9–46)
AST: 21 U/L (ref 10–35)
Albumin: 4.3 g/dL (ref 3.6–5.1)
Alkaline Phosphatase: 71 U/L (ref 40–115)
BUN: 26 mg/dL — ABNORMAL HIGH (ref 7–25)
CHLORIDE: 100 mmol/L (ref 98–110)
CO2: 20 mmol/L (ref 20–31)
Calcium: 9.5 mg/dL (ref 8.6–10.3)
Creat: 1.93 mg/dL — ABNORMAL HIGH (ref 0.70–1.25)
GFR, EST AFRICAN AMERICAN: 41 mL/min — AB (ref >=60)
GFR, EST NON AFRICAN AMERICAN: 36 mL/min — AB (ref >=60)
GLUCOSE: 155 mg/dL — AB (ref 70–99)
POTASSIUM: 4.9 mmol/L (ref 3.5–5.3)
SODIUM: 136 mmol/L (ref 135–146)
Total Bilirubin: 0.9 mg/dL (ref 0.2–1.2)
Total Protein: 7 g/dL (ref 6.1–8.1)

## 2016-01-30 LAB — LIPASE: LIPASE: 18 U/L (ref 7–60)

## 2016-01-30 NOTE — Progress Notes (Signed)
Subjective:    Patient ID: Erik Hancock, male    DOB: [birth year], 65 y.o.   MRN: [id number]  HPI Last labs were excellent as depicted below: Office Visit on 10/22/2015  Component Date Value Ref Range Status  . WBC 10/22/2015 7.4  4.0 - 10.5 K/uL Final  . RBC 10/22/2015 4.63  4.22 - 5.81 MIL/uL Final  . Hemoglobin 10/22/2015 13.6  13.0 - 17.0 g/dL Final  . HCT 10/22/2015 42.1  39.0 - 52.0 % Final  . MCV 10/22/2015 90.9  78.0 - 100.0 fL Final  . MCH 10/22/2015 29.4  26.0 - 34.0 pg Final  . MCHC 10/22/2015 32.3  30.0 - 36.0 g/dL Final  . RDW 10/22/2015 14.8  11.5 - 15.5 % Final  . Platelets 10/22/2015 301  150 - 400 K/uL Final  . MPV 10/22/2015 8.5* 8.6 - 12.4 fL Final  . Neutrophils Relative % 10/22/2015 58  43 - 77 % Final  . Neutro Abs 10/22/2015 4.3  1.7 - 7.7 K/uL Final  . Lymphocytes Relative 10/22/2015 28  12 - 46 % Final  . Lymphs Abs 10/22/2015 2.1  0.7 - 4.0 K/uL Final  . Monocytes Relative 10/22/2015 8  3 - 12 % Final  . Monocytes Absolute 10/22/2015 0.6  0.1 - 1.0 K/uL Final  . Eosinophils Relative 10/22/2015 6* 0 - 5 % Final  . Eosinophils Absolute 10/22/2015 0.4  0.0 - 0.7 K/uL Final  . Basophils Relative 10/22/2015 0  0 - 1 % Final  . Basophils Absolute 10/22/2015 0.0  0.0 - 0.1 K/uL Final  . Smear Review 10/22/2015 Criteria for review not met   Final  . Sodium 10/22/2015 139  135 - 146 mmol/L Final  . Potassium 10/22/2015 4.8  3.5 - 5.3 mmol/L Final  . Chloride 10/22/2015 105  98 - 110 mmol/L Final  . CO2 10/22/2015 24  20 - 31 mmol/L Final  . Glucose, Bld 10/22/2015 118* 70 - 99 mg/dL Final  . BUN 10/22/2015 17  7 - 25 mg/dL Final  . Creat 10/22/2015 1.19  0.70 - 1.25 mg/dL Final  . Total Bilirubin 10/22/2015 0.6  0.2 - 1.2 mg/dL Final  . Alkaline Phosphatase 10/22/2015 75  40 - 115 U/L Final  . AST 10/22/2015 30  10 - 35 U/L Final  . ALT 10/22/2015 23  9 - 46 U/L Final  . Total Protein 10/22/2015 7.1  6.1 - 8.1 g/dL Final  . Albumin 10/22/2015 4.6  3.6 -  5.1 g/dL Final  . Calcium 10/22/2015 9.8  8.6 - 10.3 mg/dL Final  . GFR, Est African American 10/22/2015 75  >=60 mL/min Final  . GFR, Est Non African American 10/22/2015 65  >=60 mL/min Final   Comment:   The estimated GFR is a calculation valid for adults (>=32 years old) that uses the CKD-EPI algorithm to adjust for age and sex. It is   not to be used for children, pregnant women, hospitalized patients,    patients on dialysis, or with rapidly changing kidney function. According to the NKDEP, eGFR >89 is normal, 60-89 shows mild impairment, 30-59 shows moderate impairment, 15-29 shows severe impairment and <15 is ESRD.     [name] Cholesterol 10/22/2015 137  125 - 200 mg/dL Final  . Triglycerides 10/22/2015 135  <150 mg/dL Final  . HDL 10/22/2015 52  >=40 mg/dL Final  . Total CHOL/HDL Ratio 10/22/2015 2.6  <=5.0 Ratio Final  . VLDL 10/22/2015 27  <30 mg/dL Final  . LDL  Cholesterol 10/22/2015 58  <130 mg/dL Final   Comment:   Total Cholesterol/HDL Ratio:CHD Risk                        Coronary Heart Disease Risk Table                                        Men       Women          1/2 Average Risk              3.4        3.3              Average Risk              5.0        4.4           2X Average Risk              9.6        7.1           3X Average Risk             23.4       11.0 Use the calculated Patient Ratio above and the CHD Risk table  to determine the patient's CHD Risk.   . Hgb A1c MFr Bld 10/22/2015 6.2* <5.7 % Final   Comment:                                                                        According to the ADA Clinical Practice Recommendations for 2011, when HbA1c is used as a screening test:     >=6.5%   Diagnostic of Diabetes Mellitus            (if abnormal result is confirmed)   5.7-6.4%   Increased risk of developing Diabetes Mellitus   References:Diagnosis and Classification of Diabetes Mellitus,Diabetes QMVH,8469,62(XBMWU 1):S62-S69 and Standards of  Medical Care in         Diabetes - 2011,Diabetes XLKG,4010,27 (Suppl 1):S11-S61.     . Mean Plasma Glucose 10/22/2015 131* <117 mg/dL Final  . Microalb, Ur 10/22/2015 1.3  Not estab mg/dL Final   Comment: The ADA has defined abnormalities in albumin excretion as follows:           Category           Result                            (mcg/mg creatinine)                 Normal:    <30       Microalbuminuria:    30 - 299   Clinical albuminuria:    > or = 300   The ADA recommends that at least two of three specimens collected within a 3 - 6 month period be abnormal before considering a patient to be within a diagnostic category.     [name] PSA 10/22/2015 0.80  <=4.00 ng/mL Final   Comment:  Test Methodology: ECLIA PSA (Electrochemiluminescence Immunoassay)   For PSA values from 2.5-4.0, particularly in younger men <89 years old, the AUA and NCCN suggest testing for % Free PSA (3515) and evaluation of the rate of increase in PSA (PSA velocity).    01/30/16 Reports 3-4 weeks of daily episodic ruq and epigastric abdominal pain.  Also has constant nasuea.  Unable to eat.  Pain radiates into his back.  Has lost 20 lbs due to nausea and pain unable to eat.  Reports grey chalky diarrhea.  Denies melena or hematochezia.  Denies fever, sob, chest pain.  EKG shows old inferior infarct which is unchanged from last ekg.  Past Medical History  Diagnosis Date  . Diabetes mellitus   . Hypertension   . High cholesterol   . GERD (gastroesophageal reflux disease)   . Arthritis   . S/P primary angioplasty with coronary stent   . Left ureteral calculus   . Coronary artery disease [name]    [name]  . Sleep apnea     stopbang=5  . Prostatitis   . Nephrolithiasis   . Pulmonary nodule 2013    CT   Past Surgical History  Procedure Laterality Date  . Left ureteroscopic stone extraction  03-14-2003    X2  . Cervical  [place] foraminotomy w/ excision of herniated nuclec pulposus  2009    C6 - 7  . Cardiac catheterization  JUNE 2003    PATENT LAD STENT/ BODERLINE OBSTRUCTIVE DISEASE POSTERIOR DESCENDING ARTERY/ NORMAL LVF  . Coronary angioplasty with stent placement  03-30-2006  DR EDMUNDS    90% LEFT CIRMCUMFLEX  --  DE STENT X1 /  95% POSTERIOR DESCENDING ARTERY--- DE X1 STENT/ PATENT LEFT ANTERIOR DESCENDING STENT/ LOW NORMAL LVF/  . Coronary angioplasty with stent placement  JAN 2003--  DR TURNER    DE STENT TO LAD  . Cystoscopy with left stent placement  04-04-2012  . Cystoscopy with ureteroscopy  05/04/2012    Procedure: CYSTOSCOPY WITH URETEROSCOPY;  Surgeon: Molli Hazard, MD;  Location: [facility];  Service: Urology;  Laterality: Left;      . Cystoscopy w/ ureteral stent placement  05/04/2012    Procedure: CYSTOSCOPY WITH STENT REPLACEMENT;  Surgeon: Molli Hazard, MD;  Location: [facility];  Service: Urology;  Laterality: Left;  . Cystoscopy w/ retrogrades  05/04/2012    Procedure: CYSTOSCOPY WITH RETROGRADE PYELOGRAM;  Surgeon: Molli Hazard, MD;  Location: [facility];  Service: Urology;  Laterality: Left;   Current Outpatient Prescriptions on File Prior to Visit  Medication Sig Dispense Refill  . ACCU-CHEK COMPACT PLUS test strip USE TO CHECK THREE TIMES A DAY 300 each 2  . amLODipine (NORVASC) 10 MG tablet Take 1 tablet (10 mg total) by mouth daily. 90 tablet 1  . aspirin 81 MG tablet Take 81 mg by mouth daily.    . canagliflozin (INVOKANA) 300 MG TABS tablet Take 1 tablet (300 mg total) by mouth daily before breakfast. 30 tablet 5  . clopidogrel (PLAVIX) 75 MG tablet Take 1 tablet (75 mg total) by mouth daily. 90 tablet 1  . Cyanocobalamin (VITAMIN B 12 PO) Take by mouth.    [name] glipiZIDE (GLUCOTROL) 10 MG tablet Take 1 tablet (10 mg total) by mouth daily. 90 tablet 1  . Insulin Glargine (LANTUS SOLOSTAR) 100 UNIT/ML Solostar Pen INJECT 25 UNITS UNDER THE SKIN EVERY MORNING 30 mL  1    . Insulin Pen Needle 31G X 8 MM MISC Use as directed prn insulin injection 100 each 3  . lisinopril (PRINIVIL,ZESTRIL) 40 MG tablet Take 1 tablet (40 mg total) by mouth daily. 90 tablet 1  . metFORMIN (GLUCOPHAGE) 1000 MG tablet Take 1 tablet (1,000 mg total) by mouth 2 (two) times daily. 180 tablet 1  . metoprolol succinate (TOPROL-XL) 25 MG 24 hr tablet Take 1 tablet (25 mg total) by mouth daily. 90 tablet 1  . pioglitazone (ACTOS) 45 MG tablet TAKE 1 TABLET DAILY WITH BREAKFAST 90 tablet 1  . pramipexole (MIRAPEX) 0.125 MG tablet Take 2 tablets (0.25 mg total) by mouth at bedtime. 60 tablet 3  . simvastatin (ZOCOR) 20 MG tablet Take 1 tablet (20 mg total) by mouth at bedtime. 90 tablet 1  . zoster vaccine live, PF, (ZOSTAVAX) 43276 UNT/0.65ML injection Inject 19,400 Units into the skin once. 1 each 0   No current facility-administered medications on file prior to visit.   No Known Allergies Social History   Social History  . Marital Status: Married    Spouse Name: N/A  . Number of Children: N/A  . Years of Education: N/A   Occupational History  . Not on file.   Social History Main Topics  . Smoking status: Never Smoker   . Smokeless tobacco: Never Used  . Alcohol Use: No  . Drug Use: No  . Sexual Activity: Yes   Other Topics Concern  . Not on file   Social History Narrative      Review of Systems  All other systems reviewed and are negative.      Objective:   Physical Exam  Constitutional: He appears well-developed and well-nourished.  Neck: Neck supple. No JVD present. No thyromegaly present.  Cardiovascular: Normal rate, regular rhythm, normal heart sounds and intact distal pulses.   No murmur heard. Pulmonary/Chest: Effort normal and breath sounds normal. No respiratory distress. He has no wheezes. He has no rales. He exhibits no tenderness.  Abdominal: Soft. Bowel sounds are normal. He exhibits no distension and no mass. There is no tenderness. There is no  rebound and no guarding.  Musculoskeletal: He exhibits no edema.  Lymphadenopathy:    He has no cervical adenopathy.  Skin: No rash noted. No erythema.  Vitals reviewed.         Assessment & Plan:  Abdominal pain, epigastric - Plan: CBC w/MCH & 3 Part Diff, US Abdomen Limited RUQ, Lipase, COMPLETE METABOLIC PANEL WITH GFR  Other chest pain - Plan: EKG 12-Lead, US Abdomen Limited RUQ  Loss of weight I suspect cholelithiasis vs pancreatitis.  CBC shows no leukocytosis.  Check cmp and lipase.  Obtain RUQ [place] asap.  Go  To er if worse.  Consider CT of abd/pelvis if labs suggest pancreatitis or other abnormalities.  Hold metformi, simvastatin, and lisinopril until feeling better.

## 2016-01-30 NOTE — Telephone Encounter (Signed)
Pt has [facility] [place] [facility] [address] on Monday March 13 at 8am with arrival of 7:40am, pt is NPO p Midnight, lmtrc to pt to return call for apt

## 2016-01-31 NOTE — Telephone Encounter (Signed)
Pt wife called back and aware of appt 

## 2016-02-03 ENCOUNTER — Ambulatory Visit
Admission: RE | Admit: 2016-02-03 | Discharge: 2016-02-03 | Disposition: A | Discharge status: Home / Self Care | Payer: BC Managed Care – PPO | Source: Ambulatory Visit | Attending: Family Medicine | Admitting: Family Medicine

## 2016-02-03 ENCOUNTER — Other Ambulatory Visit: Payer: Self-pay | Admitting: Family Medicine

## 2016-02-03 ENCOUNTER — Telehealth: Payer: Self-pay | Admitting: *Deleted

## 2016-02-03 DIAGNOSIS — R1013 Epigastric pain: Secondary | ICD-10-CM

## 2016-02-03 DIAGNOSIS — R634 Abnormal weight loss: Secondary | ICD-10-CM

## 2016-02-03 DIAGNOSIS — R0789 Other chest pain: Secondary | ICD-10-CM

## 2016-02-03 NOTE — Telephone Encounter (Signed)
OK to change to without contrast but patient needs to let us recheck his cmp since we held his medications.

## 2016-02-03 NOTE — Telephone Encounter (Signed)
Received call from Inis SizerMarty, Tech with [facility]. [phone].  Reports that patient has CT of ABD with contrast scheduled on 02/04/2016 @ 4 pm. States that patient serum creatinine is high and GFR is low. States that radiologist recommended changing scan to without contrast.   MD please advise.

## 2016-02-04 ENCOUNTER — Other Ambulatory Visit: Payer: Self-pay | Admitting: *Deleted

## 2016-02-04 ENCOUNTER — Other Ambulatory Visit: Payer: BC Managed Care – PPO

## 2016-02-04 ENCOUNTER — Ambulatory Visit
Admission: RE | Admit: 2016-02-04 | Discharge: 2016-02-04 | Disposition: A | Discharge status: Home / Self Care | Payer: BC Managed Care – PPO | Source: Ambulatory Visit | Attending: Family Medicine | Admitting: Family Medicine

## 2016-02-04 DIAGNOSIS — R634 Abnormal weight loss: Secondary | ICD-10-CM

## 2016-02-04 DIAGNOSIS — R1013 Epigastric pain: Secondary | ICD-10-CM

## 2016-02-04 DIAGNOSIS — E860 Dehydration: Secondary | ICD-10-CM

## 2016-02-04 LAB — COMPLETE METABOLIC PANEL WITH GFR
ALT: 33 U/L (ref 9–46)
AST: 21 U/L (ref 10–35)
Albumin: 4.2 g/dL (ref 3.6–5.1)
Alkaline Phosphatase: 81 U/L (ref 40–115)
BUN: 19 mg/dL (ref 7–25)
CHLORIDE: 105 mmol/L (ref 98–110)
CO2: 21 mmol/L (ref 20–31)
Calcium: 9.9 mg/dL (ref 8.6–10.3)
Creat: 1.25 mg/dL (ref 0.70–1.25)
GFR, EST AFRICAN AMERICAN: 70 mL/min (ref >=60)
GFR, EST NON AFRICAN AMERICAN: 60 mL/min (ref >=60)
GLUCOSE: 237 mg/dL — AB (ref 70–99)
Potassium: 5.1 mmol/L (ref 3.5–5.3)
SODIUM: 139 mmol/L (ref 135–146)
Total Bilirubin: 0.5 mg/dL (ref 0.2–1.2)
Total Protein: 7.1 g/dL (ref 6.1–8.1)

## 2016-02-04 NOTE — Telephone Encounter (Signed)
Order placed for scan. Call placed to [facility]. Cresenciano GenreShannon, Tech made aware.   Patient wife contacted and made aware to have patient return to lab to repeat CMP. Order placed for labs.

## 2016-02-05 ENCOUNTER — Telehealth: Payer: Self-pay | Admitting: Family Medicine

## 2016-02-05 NOTE — Telephone Encounter (Signed)
LMTRC

## 2016-02-05 NOTE — Telephone Encounter (Signed)
-----   Message from Donita BrooksWarren T [name], MD sent at 02/03/2016  8:21 AM EDT ----- Could also be ulcer, start protonix 40 bid pending ct results.

## 2016-02-06 ENCOUNTER — Other Ambulatory Visit: Payer: Self-pay | Admitting: Family Medicine

## 2016-02-06 ENCOUNTER — Ambulatory Visit: Payer: BC Managed Care – PPO | Admitting: Physician Assistant

## 2016-02-06 DIAGNOSIS — R112 Nausea with vomiting, unspecified: Secondary | ICD-10-CM

## 2016-02-06 DIAGNOSIS — R1013 Epigastric pain: Secondary | ICD-10-CM

## 2016-02-06 DIAGNOSIS — R634 Abnormal weight loss: Secondary | ICD-10-CM

## 2016-02-06 NOTE — Telephone Encounter (Signed)
Pt's wife aware of all test results per [place].

## 2016-02-11 ENCOUNTER — Encounter (INDEPENDENT_AMBULATORY_CARE_PROVIDER_SITE_OTHER): Payer: BC Managed Care – PPO | Admitting: Ophthalmology

## 2016-02-11 DIAGNOSIS — I10 Essential (primary) hypertension: Secondary | ICD-10-CM | POA: Diagnosis not present

## 2016-02-11 DIAGNOSIS — E11311 Type 2 diabetes mellitus with unspecified diabetic retinopathy with macular edema: Secondary | ICD-10-CM

## 2016-02-11 DIAGNOSIS — H2513 Age-related nuclear cataract, bilateral: Secondary | ICD-10-CM | POA: Diagnosis not present

## 2016-02-11 DIAGNOSIS — H35033 Hypertensive retinopathy, bilateral: Secondary | ICD-10-CM

## 2016-02-11 DIAGNOSIS — H43813 Vitreous degeneration, bilateral: Secondary | ICD-10-CM

## 2016-02-11 DIAGNOSIS — E113313 Type 2 diabetes mellitus with moderate nonproliferative diabetic retinopathy with macular edema, bilateral: Secondary | ICD-10-CM | POA: Diagnosis not present

## 2016-03-10 ENCOUNTER — Encounter (INDEPENDENT_AMBULATORY_CARE_PROVIDER_SITE_OTHER): Payer: BC Managed Care – PPO | Admitting: Ophthalmology

## 2016-03-10 DIAGNOSIS — E113313 Type 2 diabetes mellitus with moderate nonproliferative diabetic retinopathy with macular edema, bilateral: Secondary | ICD-10-CM

## 2016-03-10 DIAGNOSIS — H35033 Hypertensive retinopathy, bilateral: Secondary | ICD-10-CM

## 2016-03-10 DIAGNOSIS — H2513 Age-related nuclear cataract, bilateral: Secondary | ICD-10-CM

## 2016-03-10 DIAGNOSIS — E11311 Type 2 diabetes mellitus with unspecified diabetic retinopathy with macular edema: Secondary | ICD-10-CM

## 2016-03-10 DIAGNOSIS — I10 Essential (primary) hypertension: Secondary | ICD-10-CM

## 2016-03-10 DIAGNOSIS — H43813 Vitreous degeneration, bilateral: Secondary | ICD-10-CM | POA: Diagnosis not present

## 2016-04-14 ENCOUNTER — Encounter (INDEPENDENT_AMBULATORY_CARE_PROVIDER_SITE_OTHER): Payer: BC Managed Care – PPO | Admitting: Ophthalmology

## 2016-04-14 DIAGNOSIS — H35033 Hypertensive retinopathy, bilateral: Secondary | ICD-10-CM

## 2016-04-14 DIAGNOSIS — E113313 Type 2 diabetes mellitus with moderate nonproliferative diabetic retinopathy with macular edema, bilateral: Secondary | ICD-10-CM

## 2016-04-14 DIAGNOSIS — H43813 Vitreous degeneration, bilateral: Secondary | ICD-10-CM

## 2016-04-14 DIAGNOSIS — H2513 Age-related nuclear cataract, bilateral: Secondary | ICD-10-CM | POA: Diagnosis not present

## 2016-04-14 DIAGNOSIS — E11311 Type 2 diabetes mellitus with unspecified diabetic retinopathy with macular edema: Secondary | ICD-10-CM | POA: Diagnosis not present

## 2016-04-14 DIAGNOSIS — I10 Essential (primary) hypertension: Secondary | ICD-10-CM

## 2016-05-28 ENCOUNTER — Encounter (INDEPENDENT_AMBULATORY_CARE_PROVIDER_SITE_OTHER): Payer: BC Managed Care – PPO | Admitting: Ophthalmology

## 2016-06-04 ENCOUNTER — Encounter (INDEPENDENT_AMBULATORY_CARE_PROVIDER_SITE_OTHER): Payer: BC Managed Care – PPO | Admitting: Ophthalmology

## 2016-06-04 DIAGNOSIS — H2513 Age-related nuclear cataract, bilateral: Secondary | ICD-10-CM

## 2016-06-04 DIAGNOSIS — E113313 Type 2 diabetes mellitus with moderate nonproliferative diabetic retinopathy with macular edema, bilateral: Secondary | ICD-10-CM

## 2016-06-04 DIAGNOSIS — E11311 Type 2 diabetes mellitus with unspecified diabetic retinopathy with macular edema: Secondary | ICD-10-CM | POA: Diagnosis not present

## 2016-06-04 DIAGNOSIS — H35033 Hypertensive retinopathy, bilateral: Secondary | ICD-10-CM | POA: Diagnosis not present

## 2016-06-04 DIAGNOSIS — I10 Essential (primary) hypertension: Secondary | ICD-10-CM | POA: Diagnosis not present

## 2016-06-04 DIAGNOSIS — H43813 Vitreous degeneration, bilateral: Secondary | ICD-10-CM | POA: Diagnosis not present

## 2016-07-01 ENCOUNTER — Other Ambulatory Visit: Payer: Self-pay | Admitting: Family Medicine

## 2016-07-01 NOTE — Telephone Encounter (Signed)
Refill appropriate and filled per protocol. 

## 2016-07-16 ENCOUNTER — Encounter (INDEPENDENT_AMBULATORY_CARE_PROVIDER_SITE_OTHER): Payer: BC Managed Care – PPO | Admitting: Ophthalmology

## 2016-07-16 DIAGNOSIS — H35033 Hypertensive retinopathy, bilateral: Secondary | ICD-10-CM | POA: Diagnosis not present

## 2016-07-16 DIAGNOSIS — E113313 Type 2 diabetes mellitus with moderate nonproliferative diabetic retinopathy with macular edema, bilateral: Secondary | ICD-10-CM

## 2016-07-16 DIAGNOSIS — E11311 Type 2 diabetes mellitus with unspecified diabetic retinopathy with macular edema: Secondary | ICD-10-CM

## 2016-07-16 DIAGNOSIS — I10 Essential (primary) hypertension: Secondary | ICD-10-CM | POA: Diagnosis not present

## 2016-07-16 DIAGNOSIS — H43813 Vitreous degeneration, bilateral: Secondary | ICD-10-CM | POA: Diagnosis not present

## 2016-08-10 ENCOUNTER — Other Ambulatory Visit: Payer: Self-pay | Admitting: Family Medicine

## 2016-08-27 ENCOUNTER — Encounter (INDEPENDENT_AMBULATORY_CARE_PROVIDER_SITE_OTHER): Payer: BC Managed Care – PPO | Admitting: Ophthalmology

## 2016-08-27 DIAGNOSIS — I10 Essential (primary) hypertension: Secondary | ICD-10-CM

## 2016-08-27 DIAGNOSIS — E113313 Type 2 diabetes mellitus with moderate nonproliferative diabetic retinopathy with macular edema, bilateral: Secondary | ICD-10-CM

## 2016-08-27 DIAGNOSIS — H35033 Hypertensive retinopathy, bilateral: Secondary | ICD-10-CM | POA: Diagnosis not present

## 2016-08-27 DIAGNOSIS — E11311 Type 2 diabetes mellitus with unspecified diabetic retinopathy with macular edema: Secondary | ICD-10-CM | POA: Diagnosis not present

## 2016-08-27 DIAGNOSIS — H43813 Vitreous degeneration, bilateral: Secondary | ICD-10-CM | POA: Diagnosis not present

## 2016-10-06 ENCOUNTER — Other Ambulatory Visit: Payer: Self-pay | Admitting: Family Medicine

## 2016-10-08 ENCOUNTER — Encounter (INDEPENDENT_AMBULATORY_CARE_PROVIDER_SITE_OTHER): Payer: BC Managed Care – PPO | Admitting: Ophthalmology

## 2016-10-08 DIAGNOSIS — E113313 Type 2 diabetes mellitus with moderate nonproliferative diabetic retinopathy with macular edema, bilateral: Secondary | ICD-10-CM

## 2016-10-08 DIAGNOSIS — H2513 Age-related nuclear cataract, bilateral: Secondary | ICD-10-CM

## 2016-10-08 DIAGNOSIS — H35033 Hypertensive retinopathy, bilateral: Secondary | ICD-10-CM | POA: Diagnosis not present

## 2016-10-08 DIAGNOSIS — E11311 Type 2 diabetes mellitus with unspecified diabetic retinopathy with macular edema: Secondary | ICD-10-CM

## 2016-10-08 DIAGNOSIS — H43813 Vitreous degeneration, bilateral: Secondary | ICD-10-CM | POA: Diagnosis not present

## 2016-10-08 DIAGNOSIS — I10 Essential (primary) hypertension: Secondary | ICD-10-CM | POA: Diagnosis not present

## 2016-11-24 ENCOUNTER — Encounter (INDEPENDENT_AMBULATORY_CARE_PROVIDER_SITE_OTHER): Payer: BC Managed Care – PPO | Admitting: Ophthalmology

## 2016-12-02 ENCOUNTER — Encounter (INDEPENDENT_AMBULATORY_CARE_PROVIDER_SITE_OTHER): Payer: BC Managed Care – PPO | Admitting: Ophthalmology

## 2016-12-10 ENCOUNTER — Encounter (INDEPENDENT_AMBULATORY_CARE_PROVIDER_SITE_OTHER): Payer: BC Managed Care – PPO | Admitting: Ophthalmology

## 2016-12-15 ENCOUNTER — Other Ambulatory Visit: Payer: Self-pay | Admitting: Family Medicine

## 2016-12-24 ENCOUNTER — Encounter (INDEPENDENT_AMBULATORY_CARE_PROVIDER_SITE_OTHER): Payer: Medicare Other | Admitting: Ophthalmology

## 2016-12-24 ENCOUNTER — Encounter: Payer: Self-pay | Admitting: Family Medicine

## 2016-12-24 DIAGNOSIS — H35033 Hypertensive retinopathy, bilateral: Secondary | ICD-10-CM | POA: Diagnosis not present

## 2016-12-24 DIAGNOSIS — E113313 Type 2 diabetes mellitus with moderate nonproliferative diabetic retinopathy with macular edema, bilateral: Secondary | ICD-10-CM

## 2016-12-24 DIAGNOSIS — E11319 Type 2 diabetes mellitus with unspecified diabetic retinopathy without macular edema: Secondary | ICD-10-CM | POA: Insufficient documentation

## 2016-12-24 DIAGNOSIS — I10 Essential (primary) hypertension: Secondary | ICD-10-CM | POA: Diagnosis not present

## 2016-12-24 DIAGNOSIS — H43813 Vitreous degeneration, bilateral: Secondary | ICD-10-CM

## 2016-12-24 DIAGNOSIS — E11311 Type 2 diabetes mellitus with unspecified diabetic retinopathy with macular edema: Secondary | ICD-10-CM

## 2016-12-24 DIAGNOSIS — H2513 Age-related nuclear cataract, bilateral: Secondary | ICD-10-CM

## 2016-12-24 LAB — HM DIABETES EYE EXAM

## 2016-12-28 ENCOUNTER — Encounter: Payer: Self-pay | Admitting: Family Medicine

## 2017-01-14 ENCOUNTER — Other Ambulatory Visit: Payer: Self-pay | Admitting: Family Medicine

## 2017-01-15 NOTE — Telephone Encounter (Signed)
Medication refill for one time only.  Patient needs to be seen.  

## 2017-01-28 ENCOUNTER — Encounter (INDEPENDENT_AMBULATORY_CARE_PROVIDER_SITE_OTHER): Payer: Medicare Other | Admitting: Ophthalmology

## 2017-02-11 ENCOUNTER — Ambulatory Visit (INDEPENDENT_AMBULATORY_CARE_PROVIDER_SITE_OTHER): Payer: Medicare Other | Admitting: Family Medicine

## 2017-02-11 VITALS — BP 130/78 | HR 64 | Temp 97.9°F | Resp 14 | Ht 69.5 in | Wt 295.0 lb

## 2017-02-11 DIAGNOSIS — E7800 Pure hypercholesterolemia, unspecified: Secondary | ICD-10-CM | POA: Diagnosis not present

## 2017-02-11 DIAGNOSIS — E119 Type 2 diabetes mellitus without complications: Secondary | ICD-10-CM | POA: Diagnosis not present

## 2017-02-11 DIAGNOSIS — I10 Essential (primary) hypertension: Secondary | ICD-10-CM

## 2017-02-11 DIAGNOSIS — I2510 Atherosclerotic heart disease of native coronary artery without angina pectoris: Secondary | ICD-10-CM

## 2017-02-11 DIAGNOSIS — Z23 Encounter for immunization: Secondary | ICD-10-CM | POA: Diagnosis not present

## 2017-02-11 DIAGNOSIS — Z794 Long term (current) use of insulin: Secondary | ICD-10-CM | POA: Diagnosis not present

## 2017-02-11 NOTE — Progress Notes (Signed)
Subjective:    Patient ID: Erik Hancock, male    DOB: 12-01-1950, 66 y.o.   MRN: [id number]  HPI  Patient has not been seen in approximately a year. Overall he says he's been doing well. He admits that recently his sugars a been running higher as high as 200. He denies any polyuria or polydipsia. He does have blurry vision but he sees an eye specialist. He has been receiving injections in his eye due to neovascularization from diabetic retinopathy care of Dr. Ashley RoyaltyMatthews for the last several years. He is also recently had cataract surgery. Denies any chest pain shortness of breath or dyspnea on exertion. He does have a history of drug-eluting stents 3. He is still on dual antiplatelet therapy. However he has not seen a cardiologist in quite some time and therefore I believe that this is an oversight on my part area and I encouraged him to see his cardiologist for regular follow-up and he denies any myalgias or right upper quadrant pain Past Medical History:  Diagnosis Date  . Arthritis   . Coronary artery disease [name]   [name]  . Diabetes mellitus   . Diabetic retinopathy (HCC)   . GERD (gastroesophageal reflux disease)   . High cholesterol   . Hypertension   . Left ureteral calculus   . Nephrolithiasis   . Prostatitis   . Pulmonary nodule 2013   CT  . S/P primary angioplasty with coronary stent   . Sleep apnea    stopbang=5   Past Surgical History:  Procedure Laterality Date  . CARDIAC CATHETERIZATION  JUNE 2003   PATENT LAD STENT/ BODERLINE OBSTRUCTIVE DISEASE POSTERIOR DESCENDING ARTERY/ NORMAL LVF  . CERVICAL [place] FORAMINOTOMY W/ EXCISION OF HERNIATED NUCLEC PULPOSUS  2009   C6 - 7  . CORONARY ANGIOPLASTY WITH STENT PLACEMENT  03-30-2006  DR EDMUNDS   90% LEFT CIRMCUMFLEX  --  DE STENT X1 /  95% POSTERIOR DESCENDING ARTERY--- DE X1 STENT/ PATENT LEFT ANTERIOR DESCENDING STENT/ LOW NORMAL  LVF/  . CORONARY ANGIOPLASTY WITH STENT PLACEMENT  JAN 2003--  DR TURNER   DE STENT TO LAD  . CYSTOSCOPY W/ RETROGRADES  05/04/2012   Procedure: CYSTOSCOPY WITH RETROGRADE PYELOGRAM;  Surgeon: Milford Cageaniel Young Woodruff, MD;  Location: [facility];  Service: Urology;  Laterality: Left;  . CYSTOSCOPY W/ URETERAL STENT PLACEMENT  05/04/2012   Procedure: CYSTOSCOPY WITH STENT REPLACEMENT;  Surgeon: Milford Cageaniel Young Woodruff, MD;  Location: [facility];  Service: Urology;  Laterality: Left;  . cystoscopy with left stent placement  04-04-2012  . CYSTOSCOPY WITH URETEROSCOPY  05/04/2012   Procedure: CYSTOSCOPY WITH URETEROSCOPY;  Surgeon: Milford Cageaniel Young Woodruff, MD;  Location: [facility];  Service: Urology;  Laterality: Left;      . LEFT URETEROSCOPIC STONE EXTRACTION  03-14-2003   X2   Current Outpatient Prescriptions on File Prior to Visit  Medication Sig Dispense Refill  . ACCU-CHEK COMPACT PLUS test strip USE TO CHECK THREE TIMES A DAY 300 each 2  . amLODipine (NORVASC) 10 MG tablet TAKE ONE TABLET BY MOUTH ONCE DAILY 90 tablet 1  . aspirin 81 MG tablet Take 81 mg by mouth daily.    . clopidogrel (PLAVIX) 75 MG tablet TAKE ONE TABLET BY MOUTH ONCE DAILY 30 tablet 0  . glipiZIDE (GLUCOTROL) 10 MG tablet TAKE ONE TABLET BY MOUTH ONCE DAILY 30 tablet 0  . Insulin Pen Needle  31G X 8 MM MISC Use as directed prn insulin injection 100 each 3  . LANTUS SOLOSTAR 100 UNIT/ML Solostar Pen INJECT 25 UNITS UNDER THE SKIN EVERY MORNING 30 pen 1  . lisinopril (PRINIVIL,ZESTRIL) 40 MG tablet TAKE ONE TABLET BY MOUTH ONCE DAILY 30 tablet 0  . metFORMIN (GLUCOPHAGE) 1000 MG tablet TAKE ONE TABLET BY MOUTH TWICE DAILY 180 tablet 1  . metoprolol succinate (TOPROL-XL) 25 MG 24 hr tablet TAKE ONE TABLET BY MOUTH ONCE DAILY 30 tablet 0  . simvastatin (ZOCOR) 20 MG tablet TAKE ONE TABLET BY MOUTH ONCE DAILY AT BEDTIME 30 tablet 0   No current facility-administered medications on file prior to visit.    No Known Allergies Social History     Social History  . Marital status: Married    Spouse name: N/A  . Number of children: N/A  . Years of education: N/A   Occupational History  . Not on file.   Social History Main Topics  . Smoking status: Never Smoker  . Smokeless tobacco: Never Used  . Alcohol use No  . Drug use: No  . Sexual activity: Yes   Other Topics Concern  . Not on file   Social History Narrative  . No narrative on file      Review of Systems  All other systems reviewed and are negative.      Objective:   Physical Exam  Constitutional: He appears well-developed and well-nourished.  Neck: Neck supple. No JVD present. No thyromegaly present.  Cardiovascular: Normal rate, regular rhythm, normal heart sounds and intact distal pulses.   No murmur heard. Pulmonary/Chest: Effort normal and breath sounds normal. No respiratory distress. He has no wheezes. He has no rales. He exhibits no tenderness.  Abdominal: Soft. Bowel sounds are normal. He exhibits no distension and no mass. There is no tenderness. There is no rebound and no guarding.  Musculoskeletal: He exhibits no edema.  Lymphadenopathy:    He has no cervical adenopathy.  Skin: No rash noted. No erythema.  Vitals reviewed.         Assessment & Plan:  Controlled type 2 diabetes mellitus without complication, with long-term current use of insulin (HCC) - Plan: CBC with Differential/Platelet, COMPLETE METABOLIC PANEL WITH GFR, Microalbumin, urine, Hemoglobin A1c, Lipid panel  Pure hypercholesterolemia  Essential hypertension  ASCVD (arteriosclerotic cardiovascular disease) Blood pressure is controlled. I will check a hemoglobin A1c, a lipid panel, urine microalbumin, and a CMP. Goal LDL cholesterol is less than 70. Goal hemoglobin A1c is less than 7. Given his history of coronary artery disease, I would recommend adding jardiance due to the reduction in cardiovascular mortality. I discussed this at length with the patient. He would be  willing to consider this as long as his affordable. Strongly encouraged the patient to follow-up with his cardiologist. I believe he can likely stop the Plavix as he is been on this medication for several years now. I would like him to continue aspirin

## 2017-02-11 NOTE — Addendum Note (Signed)
Addended by: Legrand RamsWILLIS, SANDY B on: 02/11/2017 12:57 PM   Modules accepted: Orders

## 2017-02-12 LAB — CBC WITH DIFFERENTIAL/PLATELET
BASOS ABS: 0 {cells}/uL (ref 0–200)
Basophils Relative: 0 %
EOS ABS: 388 {cells}/uL (ref 15–500)
Eosinophils Relative: 4 %
HEMATOCRIT: 45.9 % (ref 38.5–50.0)
HEMOGLOBIN: 15.2 g/dL (ref 13.0–17.0)
LYMPHS ABS: 2425 {cells}/uL (ref 850–3900)
LYMPHS PCT: 25 %
MCH: 29 pg (ref 27.0–33.0)
MCHC: 33.1 g/dL (ref 32.0–36.0)
MCV: 87.4 fL (ref 80.0–100.0)
MONO ABS: 679 {cells}/uL (ref 200–950)
MPV: 8.7 fL (ref 7.5–12.5)
Monocytes Relative: 7 %
NEUTROS PCT: 64 %
Neutro Abs: 6208 cells/uL (ref 1500–7800)
Platelets: 345 10*3/uL (ref 140–400)
RBC: 5.25 MIL/uL (ref 4.20–5.80)
RDW: 14.2 % (ref 11.0–15.0)
WBC: 9.7 10*3/uL (ref 3.8–10.8)

## 2017-02-12 LAB — COMPLETE METABOLIC PANEL WITH GFR
ALBUMIN: 4.5 g/dL (ref 3.6–5.1)
ALK PHOS: 91 U/L (ref 40–115)
ALT: 39 U/L (ref 9–46)
AST: 31 U/L (ref 10–35)
BUN: 15 mg/dL (ref 7–25)
CALCIUM: 9.6 mg/dL (ref 8.6–10.3)
CHLORIDE: 105 mmol/L (ref 98–110)
CO2: 22 mmol/L (ref 20–31)
Creat: 1.05 mg/dL (ref 0.70–1.25)
GFR, EST AFRICAN AMERICAN: 86 mL/min (ref >=60)
GFR, EST NON AFRICAN AMERICAN: 74 mL/min (ref >=60)
Glucose, Bld: 134 mg/dL — ABNORMAL HIGH (ref 70–99)
POTASSIUM: 4.7 mmol/L (ref 3.5–5.3)
Sodium: 139 mmol/L (ref 135–146)
Total Bilirubin: 0.6 mg/dL (ref 0.2–1.2)
Total Protein: 7.3 g/dL (ref 6.1–8.1)

## 2017-02-12 LAB — LIPID PANEL
CHOLESTEROL: 127 mg/dL (ref <200)
HDL: 51 mg/dL (ref >40)
LDL Cholesterol: 50 mg/dL (ref <100)
TRIGLYCERIDES: 132 mg/dL (ref <150)
Total CHOL/HDL Ratio: 2.5 Ratio (ref <5.0)
VLDL: 26 mg/dL (ref <30)

## 2017-02-12 LAB — MICROALBUMIN, URINE: MICROALB UR: 1.4 mg/dL

## 2017-02-12 LAB — HEMOGLOBIN A1C
HEMOGLOBIN A1C: 8.4 % — AB (ref <5.7)
Mean Plasma Glucose: 194 mg/dL

## 2017-02-17 ENCOUNTER — Other Ambulatory Visit: Payer: Self-pay | Admitting: Family Medicine

## 2017-02-17 MED ORDER — METOPROLOL SUCCINATE ER 25 MG PO TB24: 25.0000 mg | ORAL_TABLET | Freq: Every day | ORAL | 1 refills | Status: DC

## 2017-02-17 MED ORDER — EMPAGLIFLOZIN 25 MG PO TABS: 25.0000 mg | ORAL_TABLET | Freq: Every day | ORAL | 0 refills | Status: DC

## 2017-02-17 MED ORDER — SIMVASTATIN 20 MG PO TABS: 20.0000 mg | ORAL_TABLET | Freq: Every day | ORAL | 0 refills | Status: DC

## 2017-02-17 MED ORDER — INSULIN GLARGINE 100 UNIT/ML SOLOSTAR PEN: PEN_INJECTOR | SUBCUTANEOUS | 0 refills | Status: DC

## 2017-02-17 MED ORDER — LISINOPRIL 40 MG PO TABS
40.0000 mg | ORAL_TABLET | Freq: Every day | ORAL | 1 refills | Status: DC
Start: 2017-02-17 — End: 2017-08-23

## 2017-02-17 MED ORDER — METFORMIN HCL 1000 MG PO TABS: 1000.0000 mg | ORAL_TABLET | Freq: Two times a day (BID) | ORAL | 0 refills | Status: DC

## 2017-02-17 MED ORDER — AMLODIPINE BESYLATE 10 MG PO TABS: 10.0000 mg | ORAL_TABLET | Freq: Every day | ORAL | 1 refills | Status: DC

## 2017-02-17 MED ORDER — GLIPIZIDE 10 MG PO TABS: 10.0000 mg | ORAL_TABLET | Freq: Every day | ORAL | 0 refills | Status: DC

## 2017-04-15 ENCOUNTER — Encounter (INDEPENDENT_AMBULATORY_CARE_PROVIDER_SITE_OTHER): Payer: Medicare Other | Admitting: Ophthalmology

## 2017-04-15 DIAGNOSIS — E11311 Type 2 diabetes mellitus with unspecified diabetic retinopathy with macular edema: Secondary | ICD-10-CM | POA: Diagnosis not present

## 2017-04-15 DIAGNOSIS — E113313 Type 2 diabetes mellitus with moderate nonproliferative diabetic retinopathy with macular edema, bilateral: Secondary | ICD-10-CM

## 2017-04-15 DIAGNOSIS — I10 Essential (primary) hypertension: Secondary | ICD-10-CM | POA: Diagnosis not present

## 2017-04-15 DIAGNOSIS — H43813 Vitreous degeneration, bilateral: Secondary | ICD-10-CM

## 2017-04-15 DIAGNOSIS — H35033 Hypertensive retinopathy, bilateral: Secondary | ICD-10-CM | POA: Diagnosis not present

## 2017-05-10 ENCOUNTER — Other Ambulatory Visit: Payer: Self-pay | Admitting: Family Medicine

## 2017-05-10 ENCOUNTER — Ambulatory Visit (INDEPENDENT_AMBULATORY_CARE_PROVIDER_SITE_OTHER): Payer: Medicare Other | Admitting: Family Medicine

## 2017-05-10 ENCOUNTER — Encounter: Payer: Self-pay | Admitting: Family Medicine

## 2017-05-10 VITALS — BP 110/68 | HR 82 | Temp 98.2°F | Resp 20 | Ht 69.5 in | Wt 282.0 lb

## 2017-05-10 DIAGNOSIS — Z794 Long term (current) use of insulin: Secondary | ICD-10-CM | POA: Diagnosis not present

## 2017-05-10 DIAGNOSIS — G4710 Hypersomnia, unspecified: Secondary | ICD-10-CM | POA: Diagnosis not present

## 2017-05-10 DIAGNOSIS — E118 Type 2 diabetes mellitus with unspecified complications: Secondary | ICD-10-CM

## 2017-05-10 LAB — LIPID PANEL
Cholesterol: 165 mg/dL (ref <200)
HDL: 57 mg/dL (ref >40)
LDL Cholesterol: 79 mg/dL (ref <100)
Total CHOL/HDL Ratio: 2.9 Ratio (ref <5.0)
Triglycerides: 145 mg/dL (ref <150)
VLDL: 29 mg/dL (ref <30)

## 2017-05-10 LAB — CBC WITH DIFFERENTIAL/PLATELET
BASOS ABS: 0 {cells}/uL (ref 0–200)
Basophils Relative: 0 %
EOS ABS: 308 {cells}/uL (ref 15–500)
Eosinophils Relative: 4 %
HCT: 47.1 % (ref 38.5–50.0)
HEMOGLOBIN: 15.3 g/dL (ref 13.0–17.0)
LYMPHS ABS: 2233 {cells}/uL (ref 850–3900)
Lymphocytes Relative: 29 %
MCH: 28.9 pg (ref 27.0–33.0)
MCHC: 32.5 g/dL (ref 32.0–36.0)
MCV: 88.9 fL (ref 80.0–100.0)
MPV: 8.3 fL (ref 7.5–12.5)
Monocytes Absolute: 693 cells/uL (ref 200–950)
Monocytes Relative: 9 %
NEUTROS ABS: 4466 {cells}/uL (ref 1500–7800)
Neutrophils Relative %: 58 %
Platelets: 310 10*3/uL (ref 140–400)
RBC: 5.3 MIL/uL (ref 4.20–5.80)
RDW: 14.3 % (ref 11.0–15.0)
WBC: 7.7 10*3/uL (ref 3.8–10.8)

## 2017-05-10 LAB — COMPLETE METABOLIC PANEL WITH GFR
ALK PHOS: 86 U/L (ref 40–115)
ALT: 29 U/L (ref 9–46)
AST: 23 U/L (ref 10–35)
Albumin: 4.4 g/dL (ref 3.6–5.1)
BILIRUBIN TOTAL: 0.5 mg/dL (ref 0.2–1.2)
BUN: 19 mg/dL (ref 7–25)
CO2: 21 mmol/L (ref 20–31)
Calcium: 10.3 mg/dL (ref 8.6–10.3)
Chloride: 102 mmol/L (ref 98–110)
Creat: 1.3 mg/dL — ABNORMAL HIGH (ref 0.70–1.25)
GFR, EST NON AFRICAN AMERICAN: 57 mL/min — AB (ref >=60)
GFR, Est African American: 66 mL/min (ref >=60)
GLUCOSE: 186 mg/dL — AB (ref 70–99)
Potassium: 5.4 mmol/L — ABNORMAL HIGH (ref 3.5–5.3)
SODIUM: 138 mmol/L (ref 135–146)
TOTAL PROTEIN: 7.5 g/dL (ref 6.1–8.1)

## 2017-05-10 NOTE — Progress Notes (Signed)
Subjective:    Patient ID: Erik Hancock, male    DOB: Dec 30, 1950, 66 y.o.   MRN: [id number]  Medication Refill    01/2017 Patient has not been seen in approximately a year. Overall he says he's been doing well. He admits that recently his sugars a been running higher as high as 200. He denies any polyuria or polydipsia. He does have blurry vision but he sees an eye specialist. He has been receiving injections in his eye due to neovascularization from diabetic retinopathy care of Dr. Ashley Royalty for the last several years. He is also recently had cataract surgery. Denies any chest pain shortness of breath or dyspnea on exertion. He does have a history of drug-eluting stents 3. He is still on dual antiplatelet therapy. However he has not seen a cardiologist in quite some time and therefore I believe that this is an oversight on my part area and I encouraged him to see his cardiologist for regular follow-up and he denies any myalgias or right upper quadrant pain.  At that time, my plan was: Blood pressure is controlled. I will check a hemoglobin A1c, a lipid panel, urine microalbumin, and a CMP. Goal LDL cholesterol is less than 70. Goal hemoglobin A1c is less than 7. Given his history of coronary artery disease, I would recommend adding jardiance due to the reduction in cardiovascular mortality. I discussed this at length with the patient. He would be willing to consider this as long as his affordable. Strongly encouraged the patient to follow-up with his cardiologist. I believe he can likely stop the Plavix as he is been on this medication for several years now. I would like him to continue aspirin  05/10/17 A1c was 8.4 at that time and we added jardiance 25 mg poqday.  He is here for follow up.  He has lost 7 pounds since his last visit. However he admits that he has not changed his diet nor is he exercising. He does not have any sugar readings to compare. He does report 3 random sugars of 114, 144, and  180. Some are fasting. Some of those numbers are random. He does report increasing urine output since starting the new medication. He also reports trouble sleeping. His wife states that he snores at night and makes unusual gasping sounds and sleep. He reports hypersomnolence during the day. He falls asleep easily in a chair. He does not fall asleep driving. However he can easily fall asleep after eating a meal even in the middle of the day. He has been told in the past that he has sleep apnea after he recovered from anesthesia. However this is remote Past Medical History:  Diagnosis Date  . Arthritis   . Coronary artery disease [name]   [name]  . Diabetes mellitus   . Diabetic retinopathy (HCC)   . GERD (gastroesophageal reflux disease)   . High cholesterol   . Hypertension   . Left ureteral calculus   . Nephrolithiasis   . Prostatitis   . Pulmonary nodule 2013   CT  . S/P primary angioplasty with coronary stent   . Sleep apnea    stopbang=5   Past Surgical History:  Procedure Laterality Date  . CARDIAC CATHETERIZATION  JUNE 2003   PATENT LAD STENT/ BODERLINE OBSTRUCTIVE DISEASE POSTERIOR DESCENDING ARTERY/ NORMAL LVF  . CERVICAL SCOVILLE FORAMINOTOMY W/ EXCISION OF HERNIATED NUCLEC PULPOSUS  2009  C6 - 7  . CORONARY ANGIOPLASTY WITH STENT PLACEMENT  03-30-2006  DR EDMUNDS   90% LEFT CIRMCUMFLEX  --  DE STENT X1 /  95% POSTERIOR DESCENDING ARTERY--- DE X1 STENT/ PATENT LEFT ANTERIOR DESCENDING STENT/ LOW NORMAL LVF/  . CORONARY ANGIOPLASTY WITH STENT PLACEMENT  JAN 2003--  DR TURNER   DE STENT TO LAD  . CYSTOSCOPY W/ RETROGRADES  05/04/2012   Procedure: CYSTOSCOPY WITH RETROGRADE PYELOGRAM;  Surgeon: Milford Cageaniel Young Woodruff, MD;  Location: [facility];  Service: Urology;  Laterality: Left;  . CYSTOSCOPY W/ URETERAL STENT PLACEMENT  05/04/2012   Procedure: CYSTOSCOPY WITH STENT REPLACEMENT;  Surgeon: Milford Cageaniel  Young Woodruff, MD;  Location: [facility];  Service: Urology;  Laterality: Left;  . cystoscopy with left stent placement  04-04-2012  . CYSTOSCOPY WITH URETEROSCOPY  05/04/2012   Procedure: CYSTOSCOPY WITH URETEROSCOPY;  Surgeon: Milford Cageaniel Young Woodruff, MD;  Location: [facility];  Service: Urology;  Laterality: Left;      . LEFT URETEROSCOPIC STONE EXTRACTION  03-14-2003   X2   Current Outpatient Prescriptions on File Prior to Visit  Medication Sig Dispense Refill  . ACCU-CHEK COMPACT PLUS test strip USE TO CHECK THREE TIMES A DAY 300 each 2  . amLODipine (NORVASC) 10 MG tablet Take 1 tablet (10 mg total) by mouth daily. 90 tablet 1  . aspirin 81 MG tablet Take 81 mg by mouth daily.    . empagliflozin (JARDIANCE) 25 MG TABS tablet Take 25 mg by mouth daily. 90 tablet 0  . glipiZIDE (GLUCOTROL) 10 MG tablet Take 1 tablet (10 mg total) by mouth daily. 90 tablet 0  . Insulin Glargine (LANTUS SOLOSTAR) 100 UNIT/ML Solostar Pen INJECT 25 UNITS UNDER THE SKIN EVERY MORNING 10 pen 0  . Insulin Pen Needle 31G X 8 MM MISC Use as directed prn insulin injection 100 each 3  . lisinopril (PRINIVIL,ZESTRIL) 40 MG tablet Take 1 tablet (40 mg total) by mouth daily. 90 tablet 1  . metFORMIN (GLUCOPHAGE) 1000 MG tablet Take 1 tablet (1,000 mg total) by mouth 2 (two) times daily. 180 tablet 0  . metoprolol succinate (TOPROL-XL) 25 MG 24 hr tablet Take 1 tablet (25 mg total) by mouth daily. 90 tablet 1  . simvastatin (ZOCOR) 20 MG tablet Take 1 tablet (20 mg total) by mouth daily with breakfast. 90 tablet 0  . clopidogrel (PLAVIX) 75 MG tablet TAKE ONE TABLET BY MOUTH ONCE DAILY (Patient not taking: Reported on 05/10/2017) 30 tablet 0   No current facility-administered medications on file prior to visit.    No Known Allergies Social History   Social History  . Marital status: Married    Spouse name: N/A  . Number of children: N/A  . Years of education: N/A   Occupational History  . Not on file.   Social  History Main Topics  . Smoking status: Never Smoker  . Smokeless tobacco: Never Used  . Alcohol use No  . Drug use: No  . Sexual activity: Yes   Other Topics Concern  . Not on file   Social History Narrative  . No narrative on file      Review of Systems  All other systems reviewed and are negative.      Objective:   Physical Exam  Constitutional: He appears well-developed and well-nourished.  Neck: Neck supple. No JVD present. No thyromegaly present.  Cardiovascular: Normal rate, regular rhythm, normal heart sounds and intact distal pulses.   No murmur heard. Pulmonary/Chest: Effort normal  and breath sounds normal. No respiratory distress. He has no wheezes. He has no rales. He exhibits no tenderness.  Abdominal: Soft. Bowel sounds are normal. He exhibits no distension and no mass. There is no tenderness. There is no rebound and no guarding.  Musculoskeletal: He exhibits no edema.  Lymphadenopathy:    He has no cervical adenopathy.  Skin: No rash noted. No erythema.  Vitals reviewed.         Assessment & Plan:  Controlled type 2 diabetes mellitus with complication, with long-term current use of insulin (HCC) - Plan: CBC with Differential/Platelet, COMPLETE METABOLIC PANEL WITH GFR, Lipid panel, Microalbumin, urine  Hypersomnolence - Plan: Split night study I will schedule patient to me with a neurologist for a split-level sleep study to evaluate for obstructive sleep apnea. His hypersomnolence, his body habitus, and some of his other symptoms suggest a high likelihood of structure sleep apnea. His blood pressure today is well controlled at 110/68. He denies any chest pain shortness of breath or dyspnea on exertion. I will check a fasting lipid panel. His goal LDL cholesterol is less than 70 given his history of ASCVD. I will also check a hemoglobin A1c. Ideally I like his hemoglobin A1c less than 7. Strongly recommended he start exercising 30 minutes a day 5 days a week  and restrict his calorie intake to try to achieve 20-30 pounds weight loss gradually over the next 6 months.

## 2017-05-11 LAB — MICROALBUMIN, URINE: MICROALB UR: 0.4 mg/dL

## 2017-05-12 LAB — HEMOGLOBIN A1C
Hgb A1c MFr Bld: 8.4 % — ABNORMAL HIGH
Mean Plasma Glucose: 194 mg/dL

## 2017-05-17 ENCOUNTER — Ambulatory Visit: Payer: Medicare Other | Admitting: Family Medicine

## 2017-05-17 ENCOUNTER — Other Ambulatory Visit: Payer: Self-pay | Admitting: Family Medicine

## 2017-05-18 ENCOUNTER — Other Ambulatory Visit: Payer: Self-pay | Admitting: Family Medicine

## 2017-05-18 MED ORDER — INSULIN GLARGINE 100 UNIT/ML SOLOSTAR PEN: PEN_INJECTOR | SUBCUTANEOUS | 0 refills | Status: DC

## 2017-05-25 ENCOUNTER — Telehealth: Payer: Self-pay | Admitting: *Deleted

## 2017-05-25 NOTE — Telephone Encounter (Signed)
Sheena Fields from [facility] phoned to inform us she has been unable to contact this pt to set up sleep study.

## 2017-05-31 ENCOUNTER — Encounter (INDEPENDENT_AMBULATORY_CARE_PROVIDER_SITE_OTHER): Payer: Medicare Other | Admitting: Ophthalmology

## 2017-06-14 ENCOUNTER — Encounter (INDEPENDENT_AMBULATORY_CARE_PROVIDER_SITE_OTHER): Payer: Medicare Other | Admitting: Ophthalmology

## 2017-06-14 ENCOUNTER — Encounter: Payer: Self-pay | Admitting: Family Medicine

## 2017-06-14 ENCOUNTER — Ambulatory Visit (INDEPENDENT_AMBULATORY_CARE_PROVIDER_SITE_OTHER): Payer: Medicare Other | Admitting: Family Medicine

## 2017-06-14 ENCOUNTER — Encounter (INDEPENDENT_AMBULATORY_CARE_PROVIDER_SITE_OTHER): Payer: Self-pay

## 2017-06-14 VITALS — BP 110/68 | HR 72 | Temp 98.1°F | Resp 16 | Ht 69.5 in | Wt 271.0 lb

## 2017-06-14 DIAGNOSIS — K219 Gastro-esophageal reflux disease without esophagitis: Secondary | ICD-10-CM

## 2017-06-14 NOTE — Progress Notes (Signed)
Subjective:    Patient ID: Erik Hancock, male    DOB: [birth year], 66 y.o.   MRN: [id number]  Medication Refill   Over the last 2 weeks, the patient reports reflux. He states that he is easily nauseated. He complained of stomach acid coming up into his chest and occasionally tasting a bitter acidic brine-like fluid in his mouth. This makes him vomit. However the vomitus is usually clear fluid or yellow stomach acid. He also reports nausea and no satiety. He denies any constipation. He denies any blood in his stool. He denies any melena. He is able to keep food down. He denies any abdominal distention or bloating. He has normal bowel sounds in all 4 quadrants. He denies any symptoms of gastroparesis however he has daily heartburn. Went to an emergency room on vacation. Lab work that was unremarkable including cardiac enzymes 3, normal CBC, normal CMP, and a normal lipase. They started him on Carafate which is helped slightly. Past Medical History:  Diagnosis Date  . Arthritis   . Coronary artery disease [name]   [name]  . Diabetes mellitus   . Diabetic retinopathy (HCC)   . GERD (gastroesophageal reflux disease)   . High cholesterol   . Hypertension   . Left ureteral calculus   . Nephrolithiasis   . Prostatitis   . Pulmonary nodule 2013   CT  . S/P primary angioplasty with coronary stent   . Sleep apnea    stopbang=5   Past Surgical History:  Procedure Laterality Date  . CARDIAC CATHETERIZATION  JUNE 2003   PATENT LAD STENT/ BODERLINE OBSTRUCTIVE DISEASE POSTERIOR DESCENDING ARTERY/ NORMAL LVF  . CERVICAL SCOVILLE FORAMINOTOMY W/ EXCISION OF HERNIATED NUCLEC PULPOSUS  2009   C6 - 7  . CORONARY ANGIOPLASTY WITH STENT PLACEMENT  03-30-2006  DR EDMUNDS   90% LEFT CIRMCUMFLEX  --  DE STENT X1 /  95% POSTERIOR DESCENDING ARTERY--- DE X1 STENT/ PATENT LEFT ANTERIOR DESCENDING STENT/ LOW NORMAL LVF/    . CORONARY ANGIOPLASTY WITH STENT PLACEMENT  JAN 2003--  DR TURNER   DE STENT TO LAD  . CYSTOSCOPY W/ RETROGRADES  05/04/2012   Procedure: CYSTOSCOPY WITH RETROGRADE PYELOGRAM;  Surgeon: Milford Cage, MD;  Location: [facility];  Service: Urology;  Laterality: Left;  . CYSTOSCOPY W/ URETERAL STENT PLACEMENT  05/04/2012   Procedure: CYSTOSCOPY WITH STENT REPLACEMENT;  Surgeon: Milford Cage, MD;  Location: [facility];  Service: Urology;  Laterality: Left;  . cystoscopy with left stent placement  04-04-2012  . CYSTOSCOPY WITH URETEROSCOPY  05/04/2012   Procedure: CYSTOSCOPY WITH URETEROSCOPY;  Surgeon: Milford Cage, MD;  Location: [facility];  Service: Urology;  Laterality: Left;      . LEFT URETEROSCOPIC STONE EXTRACTION  03-14-2003   X2   Current Outpatient Prescriptions on File Prior to Visit  Medication Sig Dispense Refill  . ACCU-CHEK COMPACT PLUS test strip USE TO CHECK THREE TIMES A DAY 300 each 2  . amLODipine (NORVASC) 10 MG tablet Take 1 tablet (10 mg total) by mouth daily. 90 tablet 1  . aspirin 81 MG tablet Take 81 mg by mouth daily.    [name] glipiZIDE (GLUCOTROL) 10 MG tablet TAKE 1 TABLET BY MOUTH ONCE DAILY 90 tablet 1  . Insulin Glargine (LANTUS SOLOSTAR) 100 UNIT/ML Solostar Pen INJECT 32 UNITS UNDER THE SKIN EVERY MORNING 30 mL 0  . Insulin Pen  Needle 31G X 8 MM MISC Use as directed prn insulin injection 100 each 3  . JARDIANCE 25 MG TABS tablet TAKE 1 TABLET BY MOUTH ONCE DAILY 90 tablet 1  . lisinopril (PRINIVIL,ZESTRIL) 40 MG tablet Take 1 tablet (40 mg total) by mouth daily. 90 tablet 1  . metFORMIN (GLUCOPHAGE) 1000 MG tablet Take 1 tablet (1,000 mg total) by mouth 2 (two) times daily. 180 tablet 0  . metoprolol succinate (TOPROL-XL) 25 MG 24 hr tablet Take 1 tablet (25 mg total) by mouth daily. 90 tablet 1  . simvastatin (ZOCOR) 20 MG tablet Take 1 tablet (20 mg total) by mouth daily with breakfast. 90 tablet 0  . clopidogrel (PLAVIX) 75 MG tablet TAKE ONE  TABLET BY MOUTH ONCE DAILY (Patient not taking: Reported on 05/10/2017) 30 tablet 0   No current facility-administered medications on file prior to visit.    No Known Allergies Social History   Social History  . Marital status: Married    Spouse name: N/A  . Number of children: N/A  . Years of education: N/A   Occupational History  . Not on file.   Social History Main Topics  . Smoking status: Never Smoker  . Smokeless tobacco: Never Used  . Alcohol use No  . Drug use: No  . Sexual activity: Yes   Other Topics Concern  . Not on file   Social History Narrative  . No narrative on file      Review of Systems  All other systems reviewed and are negative.      Objective:   Physical Exam  Constitutional: He appears well-developed and well-nourished.  Neck: Neck supple. No JVD present. No thyromegaly present.  Cardiovascular: Normal rate, regular rhythm, normal heart sounds and intact distal pulses.   No murmur heard. Pulmonary/Chest: Effort normal and breath sounds normal. No respiratory distress. He has no wheezes. He has no rales. He exhibits no tenderness.  Abdominal: Soft. Bowel sounds are normal. He exhibits no distension and no mass. There is no tenderness. There is no rebound and no guarding.  Musculoskeletal: He exhibits no edema.  Lymphadenopathy:    He has no cervical adenopathy.  Skin: No rash noted. No erythema.  Vitals reviewed.         Assessment & Plan:  Gastroesophageal reflux disease without esophagitis  Add dexilant 60 mg poqday for GERD and recheck in 2 weeks or sooner if worse.

## 2017-06-28 ENCOUNTER — Encounter (INDEPENDENT_AMBULATORY_CARE_PROVIDER_SITE_OTHER): Payer: Medicare Other | Admitting: Ophthalmology

## 2017-06-28 DIAGNOSIS — I10 Essential (primary) hypertension: Secondary | ICD-10-CM | POA: Diagnosis not present

## 2017-06-28 DIAGNOSIS — H35033 Hypertensive retinopathy, bilateral: Secondary | ICD-10-CM

## 2017-06-28 DIAGNOSIS — E11311 Type 2 diabetes mellitus with unspecified diabetic retinopathy with macular edema: Secondary | ICD-10-CM

## 2017-06-28 DIAGNOSIS — H353132 Nonexudative age-related macular degeneration, bilateral, intermediate dry stage: Secondary | ICD-10-CM | POA: Diagnosis not present

## 2017-06-28 DIAGNOSIS — E113312 Type 2 diabetes mellitus with moderate nonproliferative diabetic retinopathy with macular edema, left eye: Secondary | ICD-10-CM | POA: Diagnosis not present

## 2017-06-28 DIAGNOSIS — H43813 Vitreous degeneration, bilateral: Secondary | ICD-10-CM

## 2017-06-28 LAB — HM DIABETES EYE EXAM

## 2017-06-29 ENCOUNTER — Telehealth: Payer: Self-pay | Admitting: Family Medicine

## 2017-06-29 NOTE — Telephone Encounter (Signed)
Pt called to say the medication we had given samples of worked great but dr.pickard said it would be too expensive oop for them he said if it worked he had something similar that we could call in for the indigestion, they would like for us to go ahead and call it in. (they do not not know the names of either medication)

## 2017-07-01 MED ORDER — PANTOPRAZOLE SODIUM 40 MG PO TBEC: 40.0000 mg | DELAYED_RELEASE_TABLET | Freq: Every day | ORAL | 3 refills | Status: DC

## 2017-07-01 NOTE — Telephone Encounter (Signed)
Switch to protonix 40 qday

## 2017-07-01 NOTE — Telephone Encounter (Signed)
Medication called/sent to pharmacy ([facility]) and pt aware via vm

## 2017-07-17 ENCOUNTER — Other Ambulatory Visit: Payer: Self-pay | Admitting: Family Medicine

## 2017-07-19 ENCOUNTER — Other Ambulatory Visit: Payer: Self-pay | Admitting: Family Medicine

## 2017-07-19 MED ORDER — SIMVASTATIN 20 MG PO TABS: 20.0000 mg | ORAL_TABLET | Freq: Every day | ORAL | 1 refills | Status: DC

## 2017-07-29 ENCOUNTER — Encounter: Payer: Self-pay | Admitting: Family Medicine

## 2017-07-29 ENCOUNTER — Ambulatory Visit (INDEPENDENT_AMBULATORY_CARE_PROVIDER_SITE_OTHER): Payer: Medicare Other | Admitting: Family Medicine

## 2017-07-29 VITALS — BP 118/74 | HR 70 | Temp 97.9°F | Resp 16 | Ht 69.5 in | Wt 285.0 lb

## 2017-07-29 DIAGNOSIS — L03211 Cellulitis of face: Secondary | ICD-10-CM

## 2017-07-29 MED ORDER — VALACYCLOVIR HCL 1 G PO TABS: 1000.0000 mg | ORAL_TABLET | Freq: Three times a day (TID) | ORAL | 0 refills | Status: DC

## 2017-07-29 MED ORDER — SULFAMETHOXAZOLE-TRIMETHOPRIM 800-160 MG PO TABS: 1.0000 | ORAL_TABLET | Freq: Two times a day (BID) | ORAL | 0 refills | Status: DC

## 2017-07-29 NOTE — Progress Notes (Signed)
Subjective:    Patient ID: Erik Hancock, male    DOB: 14-Apr-1951, 66 y.o.   MRN: [id number]  HPI Patient has several lesions on the left side of his face. Begin 1 week ago. The largest lesion is on his mid point on his mandible. It is an erythematous patch of indurated firm tender skin with a central shallow ulcer/scab. There is also a similar swollen indurated tender erythematous patch of skin on his chin. There are 2 small papules underneath his lip. None of these lesions across the midline of his face. There is also pain and swelling and tenderness although there is no visible erythema in his left auditory canal. Lesions are all following the third branch of the trigeminal nerve. Differential diagnosis is cellulitis with spread due to contact versus shingles. There are no discernible blisters. However he does report neuropathic pain on the left side of his face Past Medical History:  Diagnosis Date  . Arthritis   . Coronary artery disease [name]   [name]  . Diabetes mellitus   . Diabetic retinopathy (HCC)   . GERD (gastroesophageal reflux disease)   . High cholesterol   . Hypertension   . Left ureteral calculus   . Nephrolithiasis   . Prostatitis   . Pulmonary nodule 2013   CT  . S/P primary angioplasty with coronary stent   . Sleep apnea    stopbang=5   Past Surgical History:  Procedure Laterality Date  . CARDIAC CATHETERIZATION  JUNE 2003   PATENT LAD STENT/ BODERLINE OBSTRUCTIVE DISEASE POSTERIOR DESCENDING ARTERY/ NORMAL LVF  . CERVICAL SCOVILLE FORAMINOTOMY W/ EXCISION OF HERNIATED NUCLEC PULPOSUS  2009   C6 - 7  . CORONARY ANGIOPLASTY WITH STENT PLACEMENT  03-30-2006  DR EDMUNDS   90% LEFT CIRMCUMFLEX  --  DE STENT X1 /  95% POSTERIOR DESCENDING ARTERY--- DE X1 STENT/ PATENT LEFT ANTERIOR DESCENDING STENT/ LOW NORMAL LVF/  . CORONARY ANGIOPLASTY WITH STENT PLACEMENT  JAN 2003--  DR  TURNER   DE STENT TO LAD  . CYSTOSCOPY W/ RETROGRADES  05/04/2012   Procedure: CYSTOSCOPY WITH RETROGRADE PYELOGRAM;  Surgeon: Milford Cageaniel Young Woodruff, MD;  Location: [facility];  Service: Urology;  Laterality: Left;  . CYSTOSCOPY W/ URETERAL STENT PLACEMENT  05/04/2012   Procedure: CYSTOSCOPY WITH STENT REPLACEMENT;  Surgeon: Milford Cageaniel Young Woodruff, MD;  Location: [facility];  Service: Urology;  Laterality: Left;  . cystoscopy with left stent placement  04-04-2012  . CYSTOSCOPY WITH URETEROSCOPY  05/04/2012   Procedure: CYSTOSCOPY WITH URETEROSCOPY;  Surgeon: Milford Cageaniel Young Woodruff, MD;  Location: [facility];  Service: Urology;  Laterality: Left;      . LEFT URETEROSCOPIC STONE EXTRACTION  03-14-2003   X2   Current Outpatient Prescriptions on File Prior to Visit  Medication Sig Dispense Refill  . ACCU-CHEK COMPACT PLUS test strip USE TO CHECK THREE TIMES A DAY 300 each 2  . amLODipine (NORVASC) 10 MG tablet Take 1 tablet (10 mg total) by mouth daily. 90 tablet 1  . aspirin 81 MG tablet Take 81 mg by mouth daily.    [name]. glipiZIDE (GLUCOTROL) 10 MG tablet TAKE 1 TABLET BY MOUTH ONCE DAILY 90 tablet 1  . Insulin Glargine (LANTUS SOLOSTAR) 100 UNIT/ML Solostar Pen INJECT 32 UNITS UNDER THE SKIN EVERY MORNING 30 mL 0  . Insulin Pen Needle 31G X 8 MM MISC Use as directed prn insulin injection  100 each 3  . JARDIANCE 25 MG TABS tablet TAKE 1 TABLET BY MOUTH ONCE DAILY 90 tablet 1  . lisinopril (PRINIVIL,ZESTRIL) 40 MG tablet Take 1 tablet (40 mg total) by mouth daily. 90 tablet 1  . metFORMIN (GLUCOPHAGE) 1000 MG tablet Take 1 tablet (1,000 mg total) by mouth 2 (two) times daily. 180 tablet 0  . metoprolol succinate (TOPROL-XL) 25 MG 24 hr tablet Take 1 tablet (25 mg total) by mouth daily. 90 tablet 1  . pantoprazole (PROTONIX) 40 MG tablet Take 1 tablet (40 mg total) by mouth daily. 90 tablet 3  . simvastatin (ZOCOR) 20 MG tablet Take 1 tablet (20 mg total) by mouth daily with breakfast. 90 tablet 1  . sucralfate  (CARAFATE) 1 g tablet Take 1 g by mouth 4 (four) times daily.  0  . clopidogrel (PLAVIX) 75 MG tablet TAKE ONE TABLET BY MOUTH ONCE DAILY (Patient not taking: Reported on 07/29/2017) 30 tablet 0   No current facility-administered medications on file prior to visit.    No Known Allergies Social History   Social History  . Marital status: Married    Spouse name: N/A  . Number of children: N/A  . Years of education: N/A   Occupational History  . Not on file.   Social History Main Topics  . Smoking status: Never Smoker  . Smokeless tobacco: Never Used  . Alcohol use No  . Drug use: No  . Sexual activity: Yes   Other Topics Concern  . Not on file   Social History Narrative  . No narrative on file      Review of Systems  All other systems reviewed and are negative.      Objective:   Physical Exam  HENT:  Head:    Cardiovascular: Normal rate, regular rhythm and normal heart sounds.   Pulmonary/Chest: Effort normal and breath sounds normal. No respiratory distress. He has no wheezes. He has no rales. He exhibits no tenderness.  Abdominal: Bowel sounds are normal.  Skin: Rash noted. There is erythema.  Vitals reviewed.  Lesions are diagrammed. Each of the areas is an indurated red painful patch of skin. The largest on the midpoint of his mandible also has a central ulcer       Assessment & Plan:  Cellulitis of face - Plan: sulfamethoxazole-trimethoprim (BACTRIM DS,SEPTRA DS) 800-160 MG tablet  For initial diagnosis includes staphylococcal cellulitis was spread secondary to contact versus shingles. Honestly not sure. Begin the patient on Bactrim double strength tablets 1 by mouth twice a day for 7 days as well as Valtrex 1 g by mouth 3 times a day for 7 days and reassess next week or sooner if worsening

## 2017-08-09 ENCOUNTER — Encounter (INDEPENDENT_AMBULATORY_CARE_PROVIDER_SITE_OTHER): Payer: Medicare Other | Admitting: Ophthalmology

## 2017-08-11 ENCOUNTER — Other Ambulatory Visit: Payer: Self-pay

## 2017-08-11 MED ORDER — SIMVASTATIN 20 MG PO TABS: 20.0000 mg | ORAL_TABLET | Freq: Every day | ORAL | 1 refills | Status: DC

## 2017-08-11 NOTE — Telephone Encounter (Signed)
Refill appropriate 

## 2017-08-23 ENCOUNTER — Other Ambulatory Visit: Payer: Self-pay | Admitting: Family Medicine

## 2017-08-23 NOTE — Telephone Encounter (Signed)
Medication refilled per protocol. 

## 2017-12-08 ENCOUNTER — Other Ambulatory Visit: Payer: Self-pay | Admitting: Family Medicine

## 2017-12-10 ENCOUNTER — Encounter: Payer: Self-pay | Admitting: Family Medicine

## 2017-12-10 ENCOUNTER — Ambulatory Visit: Payer: Medicare Other | Admitting: Family Medicine

## 2017-12-10 VITALS — BP 110/60 | HR 70 | Temp 98.2°F | Resp 18 | Ht 69.5 in | Wt 294.0 lb

## 2017-12-10 DIAGNOSIS — E11319 Type 2 diabetes mellitus with unspecified diabetic retinopathy without macular edema: Secondary | ICD-10-CM | POA: Diagnosis not present

## 2017-12-10 DIAGNOSIS — E118 Type 2 diabetes mellitus with unspecified complications: Secondary | ICD-10-CM | POA: Diagnosis not present

## 2017-12-10 DIAGNOSIS — E1165 Type 2 diabetes mellitus with hyperglycemia: Secondary | ICD-10-CM

## 2017-12-10 DIAGNOSIS — I2510 Atherosclerotic heart disease of native coronary artery without angina pectoris: Secondary | ICD-10-CM | POA: Diagnosis not present

## 2017-12-10 DIAGNOSIS — I10 Essential (primary) hypertension: Secondary | ICD-10-CM | POA: Diagnosis not present

## 2017-12-10 DIAGNOSIS — E7800 Pure hypercholesterolemia, unspecified: Secondary | ICD-10-CM | POA: Diagnosis not present

## 2017-12-10 DIAGNOSIS — IMO0002 Reserved for concepts with insufficient information to code with codable children: Secondary | ICD-10-CM

## 2017-12-10 NOTE — Progress Notes (Signed)
Subjective:    Patient ID: Erik Hancock, male    DOB: February 07, 1951, 68 y.o.   MRN: [id number]  Medication Refill    01/2017 Patient has not been seen in approximately a year. Overall he says he's been doing well. He admits that recently his sugars a been running higher as high as 200. He denies any polyuria or polydipsia. He does have blurry vision but he sees an eye specialist. He has been receiving injections in his eye due to neovascularization from diabetic retinopathy care of Dr. Ashley Royalty for the last several years. He is also recently had cataract surgery. Denies any chest pain shortness of breath or dyspnea on exertion. He does have a history of drug-eluting stents 3. He is still on dual antiplatelet therapy. However he has not seen a cardiologist in quite some time and therefore I believe that this is an oversight on my part area and I encouraged him to see his cardiologist for regular follow-up and he denies any myalgias or right upper quadrant pain.  At that time, my plan was: Blood pressure is controlled. I will check a hemoglobin A1c, a lipid panel, urine microalbumin, and a CMP. Goal LDL cholesterol is less than 70. Goal hemoglobin A1c is less than 7. Given his history of coronary artery disease, I would recommend adding jardiance due to the reduction in cardiovascular mortality. I discussed this at length with the patient. He would be willing to consider this as long as his affordable. Strongly encouraged the patient to follow-up with his cardiologist. I believe he can likely stop the Plavix as he is been on this medication for several years now. I would like him to continue aspirin  05/10/17 A1c was 8.4 at that time and we added jardiance 25 mg poqday.  He is here for follow up.  He has lost 7 pounds since his last visit. However he admits that he has not changed his diet nor is he exercising. He does not have any sugar readings to compare. He does report 3 random sugars of 114, 144, and  180. Some are fasting. Some of those numbers are random. He does report increasing urine output since starting the new medication. He also reports trouble sleeping. His wife states that he snores at night and makes unusual gasping sounds and sleep. He reports hypersomnolence during the day. He falls asleep easily in a chair. He does not fall asleep driving. However he can easily fall asleep after eating a meal even in the middle of the day. He has been told in the past that he has sleep apnea after he recovered from anesthesia. However this is remote.  At that time, my plan was: I will schedule patient to me with a neurologist for a split-level sleep study to evaluate for obstructive sleep apnea. His hypersomnolence, his body habitus, and some of his other symptoms suggest a high likelihood of structure sleep apnea. His blood pressure today is well controlled at 110/68. He denies any chest pain shortness of breath or dyspnea on exertion. I will check a fasting lipid panel. His goal LDL cholesterol is less than 70 given his history of ASCVD. I will also check a hemoglobin A1c. Ideally I like his hemoglobin A1c less than 7. Strongly recommended he start exercising 30 minutes a day 5 days a week and restrict his calorie intake to try to achieve 20-30 pounds weight loss gradually over the next 6 months.  12/10/17 HgA1c at that time was elevated at 8.4.  Patient is here for follow up.  He stopped his jardiance since I saw him.  Still on glucotrol, lantus 32 units a day, and metformin 1000 bid. The patient discontinue Jardiance due to polyuria.  He states his blood sugars were averaging between 120 and 145 however since discontinuation of the medication they are much higher. He is not watching his diet and he is not exercising. He is also overdue for a diabetic eye exam. He denies any neuropathy in his feet. He denies any blurry vision. He denies any chest pain shortness of breath or dyspnea on exertion. He denies any  myalgias or right upper quadrant pain.  Past Medical History:  Diagnosis Date  . Arthritis   . Coronary artery disease [name]   [name]  . Diabetes mellitus   . Diabetic retinopathy (HCC)   . GERD (gastroesophageal reflux disease)   . High cholesterol   . Hypertension   . Left ureteral calculus   . Nephrolithiasis   . Prostatitis   . Pulmonary nodule 2013   CT  . S/P primary angioplasty with coronary stent   . Sleep apnea    stopbang=5   Past Surgical History:  Procedure Laterality Date  . CARDIAC CATHETERIZATION  JUNE 2003   PATENT LAD STENT/ BODERLINE OBSTRUCTIVE DISEASE POSTERIOR DESCENDING ARTERY/ NORMAL LVF  . CERVICAL SCOVILLE FORAMINOTOMY W/ EXCISION OF HERNIATED NUCLEC PULPOSUS  2009   C6 - 7  . CORONARY ANGIOPLASTY WITH STENT PLACEMENT  03-30-2006  DR EDMUNDS   90% LEFT CIRMCUMFLEX  --  DE STENT X1 /  95% POSTERIOR DESCENDING ARTERY--- DE X1 STENT/ PATENT LEFT ANTERIOR DESCENDING STENT/ LOW NORMAL LVF/  . CORONARY ANGIOPLASTY WITH STENT PLACEMENT  JAN 2003--  DR TURNER   DE STENT TO LAD  . CYSTOSCOPY W/ RETROGRADES  05/04/2012   Procedure: CYSTOSCOPY WITH RETROGRADE PYELOGRAM;  Surgeon: Milford Cage, MD;  Location: [facility];  Service: Urology;  Laterality: Left;  . CYSTOSCOPY W/ URETERAL STENT PLACEMENT  05/04/2012   Procedure: CYSTOSCOPY WITH STENT REPLACEMENT;  Surgeon: Milford Cage, MD;  Location: [facility];  Service: Urology;  Laterality: Left;  . cystoscopy with left stent placement  04-04-2012  . CYSTOSCOPY WITH URETEROSCOPY  05/04/2012   Procedure: CYSTOSCOPY WITH URETEROSCOPY;  Surgeon: Milford Cage, MD;  Location: [facility];  Service: Urology;  Laterality: Left;      . LEFT URETEROSCOPIC STONE EXTRACTION  03-14-2003   X2   Current Outpatient Medications on File Prior to Visit  Medication Sig Dispense Refill  . ACCU-CHEK COMPACT PLUS test strip USE TO  CHECK THREE TIMES A DAY 300 each 2  . amLODipine (NORVASC) 10 MG tablet TAKE 1 TABLET BY MOUTH ONCE DAILY 90 tablet 1  . aspirin 81 MG tablet Take 81 mg by mouth daily.    . clopidogrel (PLAVIX) 75 MG tablet TAKE ONE TABLET BY MOUTH ONCE DAILY (Patient not taking: Reported on 07/29/2017) 30 tablet 0  . glipiZIDE (GLUCOTROL) 10 MG tablet TAKE 1 TABLET BY MOUTH ONCE DAILY 90 tablet 1  . Insulin Pen Needle 31G X 8 MM MISC Use as directed prn insulin injection 100 each 3  . JARDIANCE 25 MG TABS tablet TAKE 1 TABLET BY MOUTH ONCE DAILY 90 tablet 1  . LANTUS SOLOSTAR 100 UNIT/ML Solostar Pen INJECT 32 UNITS UNDER THE SKIN EVERY MORNING 30 mL 1  . lisinopril (PRINIVIL,ZESTRIL) 40 MG  tablet TAKE 1 TABLET BY MOUTH ONCE DAILY 90 tablet 0  . metFORMIN (GLUCOPHAGE) 1000 MG tablet Take 1 tablet (1,000 mg total) by mouth 2 (two) times daily. 180 tablet 0  . metFORMIN (GLUCOPHAGE) 1000 MG tablet TAKE 1 TABLET BY MOUTH TWICE DAILY 180 tablet 1  . metoprolol succinate (TOPROL-XL) 25 MG 24 hr tablet TAKE 1 TABLET BY MOUTH ONCE DAILY 90 tablet 0  . pantoprazole (PROTONIX) 40 MG tablet Take 1 tablet (40 mg total) by mouth daily. 90 tablet 3  . simvastatin (ZOCOR) 20 MG tablet Take 1 tablet (20 mg total) by mouth daily with breakfast. 90 tablet 1  . sucralfate (CARAFATE) 1 g tablet Take 1 g by mouth 4 (four) times daily.  0  . sulfamethoxazole-trimethoprim (BACTRIM DS,SEPTRA DS) 800-160 MG tablet Take 1 tablet by mouth 2 (two) times daily. 14 tablet 0  . valACYclovir (VALTREX) 1000 MG tablet Take 1 tablet (1,000 mg total) by mouth 3 (three) times daily. 21 tablet 0   No current facility-administered medications on file prior to visit.    No Known Allergies Social History   Socioeconomic History  . Marital status: Married    Spouse name: Not on file  . Number of children: Not on file  . Years of education: Not on file  . Highest education level: Not on file  Social Needs  . Financial resource strain: Not on  file  . Food insecurity - worry: Not on file  . Food insecurity - inability: Not on file  . Transportation needs - medical: Not on file  . Transportation needs - non-medical: Not on file  Occupational History  . Not on file  Tobacco Use  . Smoking status: Never Smoker  . Smokeless tobacco: Never Used  Substance and Sexual Activity  . Alcohol use: No  . Drug use: No  . Sexual activity: Yes  Other Topics Concern  . Not on file  Social History Narrative  . Not on file      Review of Systems  All other systems reviewed and are negative.      Objective:   Physical Exam  Constitutional: He appears well-developed and well-nourished.  Neck: Neck supple. No JVD present. No thyromegaly present.  Cardiovascular: Normal rate, regular rhythm, normal heart sounds and intact distal pulses.  No murmur heard. Pulmonary/Chest: Effort normal and breath sounds normal. No respiratory distress. He has no wheezes. He has no rales. He exhibits no tenderness.  Abdominal: Soft. Bowel sounds are normal. He exhibits no distension and no mass. There is no tenderness. There is no rebound and no guarding.  Musculoskeletal: He exhibits no edema.  Lymphadenopathy:    He has no cervical adenopathy.  Skin: No rash noted. No erythema.  Vitals reviewed.         Assessment & Plan:  Essential hypertension  ASCVD (arteriosclerotic cardiovascular disease)  Pure hypercholesterolemia  Diabetic retinopathy of both eyes associated with type 2 diabetes mellitus, macular edema presence unspecified, unspecified retinopathy severity (HCC)  Diabetes mellitus type 2, uncontrolled, with complications (HCC) - Plan: COMPLETE METABOLIC PANEL WITH GFR, CBC with Differential/Platelet, Lipid panel, Microalbumin, urine, Hemoglobin A1c  Blood sugar today is adequately controlled. I will check a fasting lipid panel. Given his history of diabetes and coronary artery disease, his goal LDL cholesterol is less than 70.  Given his history of retinopathy, I recommended he follow-up with his ophthalmologist as he has not seen his ophthalmologist at 16 months. I recommended checking his hemoglobin A1c.  If greater than 8 as anticipated, I would recommend starting the patient on Victoza.  Flu shot is up-to-date. Diabetic foot exam is performed today

## 2017-12-11 LAB — LIPID PANEL
CHOL/HDL RATIO: 3.2 (calc) (ref <5.0)
CHOLESTEROL: 145 mg/dL (ref <200)
HDL: 46 mg/dL (ref >40)
LDL Cholesterol (Calc): 71 mg/dL (calc)
Non-HDL Cholesterol (Calc): 99 mg/dL (calc) (ref <130)
Triglycerides: 212 mg/dL — ABNORMAL HIGH (ref <150)

## 2017-12-11 LAB — CBC WITH DIFFERENTIAL/PLATELET
BASOS PCT: 0.8 %
Basophils Absolute: 68 cells/uL (ref 0–200)
EOS ABS: 527 {cells}/uL — AB (ref 15–500)
EOS PCT: 6.2 %
HCT: 44.9 % (ref 38.5–50.0)
HEMOGLOBIN: 14.7 g/dL (ref 13.2–17.1)
Lymphs Abs: 2686 cells/uL (ref 850–3900)
MCH: 28.1 pg (ref 27.0–33.0)
MCHC: 32.7 g/dL (ref 32.0–36.0)
MCV: 85.9 fL (ref 80.0–100.0)
MONOS PCT: 8.9 %
MPV: 8.5 fL (ref 7.5–12.5)
NEUTROS ABS: 4463 {cells}/uL (ref 1500–7800)
Neutrophils Relative %: 52.5 %
PLATELETS: 343 10*3/uL (ref 140–400)
RBC: 5.23 10*6/uL (ref 4.20–5.80)
RDW: 13.3 % (ref 11.0–15.0)
TOTAL LYMPHOCYTE: 31.6 %
WBC mixed population: 757 cells/uL (ref 200–950)
WBC: 8.5 10*3/uL (ref 3.8–10.8)

## 2017-12-11 LAB — MICROALBUMIN, URINE: Microalb, Ur: 0.8 mg/dL

## 2017-12-11 LAB — COMPLETE METABOLIC PANEL WITH GFR
AG Ratio: 1.8 (calc) (ref 1.0–2.5)
ALKALINE PHOSPHATASE (APISO): 89 U/L (ref 40–115)
ALT: 30 U/L (ref 9–46)
AST: 24 U/L (ref 10–35)
Albumin: 4.3 g/dL (ref 3.6–5.1)
BUN: 17 mg/dL (ref 7–25)
CHLORIDE: 105 mmol/L (ref 98–110)
CO2: 24 mmol/L (ref 20–32)
CREATININE: 1.24 mg/dL (ref 0.70–1.25)
Calcium: 9.2 mg/dL (ref 8.6–10.3)
GFR, Est African American: 70 mL/min/{1.73_m2} (ref >=60)
GFR, Est Non African American: 61 mL/min/{1.73_m2} (ref >=60)
GLOBULIN: 2.4 g/dL (ref 1.9–3.7)
GLUCOSE: 185 mg/dL — AB (ref 65–99)
Potassium: 4.7 mmol/L (ref 3.5–5.3)
SODIUM: 141 mmol/L (ref 135–146)
Total Bilirubin: 0.6 mg/dL (ref 0.2–1.2)
Total Protein: 6.7 g/dL (ref 6.1–8.1)

## 2017-12-11 LAB — HEMOGLOBIN A1C
EAG (MMOL/L): 12.2 (calc)
Hgb A1c MFr Bld: 9.3 % of total Hgb — ABNORMAL HIGH (ref <5.7)
MEAN PLASMA GLUCOSE: 220 (calc)

## 2017-12-15 ENCOUNTER — Other Ambulatory Visit: Payer: Self-pay | Admitting: Family Medicine

## 2017-12-15 MED ORDER — GLUCOSE BLOOD VI STRP: ORAL_STRIP | 12 refills | Status: DC

## 2017-12-15 MED ORDER — LIRAGLUTIDE 18 MG/3ML ~~LOC~~ SOPN: 1.2000 mg | PEN_INJECTOR | Freq: Every day | SUBCUTANEOUS | 2 refills | Status: DC

## 2017-12-15 MED ORDER — INSULIN PEN NEEDLE 31G X 8 MM MISC: 3 refills | Status: DC

## 2018-01-18 ENCOUNTER — Encounter: Payer: Self-pay | Admitting: Family Medicine

## 2018-01-18 ENCOUNTER — Ambulatory Visit: Payer: Medicare Other | Admitting: Family Medicine

## 2018-01-18 VITALS — BP 132/78 | HR 70 | Temp 97.7°F | Resp 18 | Ht 69.5 in | Wt 289.0 lb

## 2018-01-18 DIAGNOSIS — E119 Type 2 diabetes mellitus without complications: Secondary | ICD-10-CM | POA: Diagnosis not present

## 2018-01-18 DIAGNOSIS — Z794 Long term (current) use of insulin: Secondary | ICD-10-CM

## 2018-01-18 MED ORDER — LIRAGLUTIDE 18 MG/3ML ~~LOC~~ SOPN: 1.2000 mg | PEN_INJECTOR | Freq: Every day | SUBCUTANEOUS | 2 refills | Status: DC

## 2018-01-18 NOTE — Progress Notes (Signed)
Subjective:    Patient ID: Erik Hancock, male    Erik NewportOB: 1951/07/07, 67 y.o.   MRN: [id number]  Medication Refill    01/2017 Patient has not been seen in approximately a year. Overall he says he's been doing well. He admits that recently his sugars a been running higher as high as 200. He denies any polyuria or polydipsia. He does have blurry vision but he sees an eye specialist. He has been receiving injections in his eye due to neovascularization from diabetic retinopathy care of Erik Hancock for the last several years. He is also recently had cataract surgery. Denies any chest pain shortness of breath or dyspnea on exertion. He does have a history of drug-eluting stents 3. He is still on dual antiplatelet therapy. However he has not seen a cardiologist in quite some time and therefore I believe that this is an oversight on my part area and I encouraged him to see his cardiologist for regular follow-up and he denies any myalgias or right upper quadrant pain.  At that time, my plan was: Blood pressure is controlled. I will check a hemoglobin A1c, a lipid panel, urine microalbumin, and a CMP. Goal LDL cholesterol is less than 70. Goal hemoglobin A1c is less than 7. Given his history of coronary artery disease, I would recommend adding jardiance due to the reduction in cardiovascular mortality. I discussed this at length with the patient. He would be willing to consider this as long as his affordable. Strongly encouraged the patient to follow-up with his cardiologist. I believe he can likely stop the Plavix as he is been on this medication for several years now. I would like him to continue aspirin  05/10/17 A1c was 8.4 at that time and we added jardiance 25 mg poqday.  He is here for follow up.  He has lost 7 pounds since his last visit. However he admits that he has not changed his diet nor is he exercising. He does not have any sugar readings to compare. He does report 3 random sugars of 114, 144, and  180. Some are fasting. Some of those numbers are random. He does report increasing urine output since starting the new medication. He also reports trouble sleeping. His wife states that he snores at night and makes unusual gasping sounds and sleep. He reports hypersomnolence during the day. He falls asleep easily in a chair. He does not fall asleep driving. However he can easily fall asleep after eating a meal even in the middle of the day. He has been told in the past that he has sleep apnea after he recovered from anesthesia. However this is remote.  At that time, my plan was: I will schedule patient to me with a neurologist for a split-level sleep study to evaluate for obstructive sleep apnea. His hypersomnolence, his body habitus, and some of his other symptoms suggest a high likelihood of structure sleep apnea. His blood pressure today is well controlled at 110/68. He denies any chest pain shortness of breath or dyspnea on exertion. I will check a fasting lipid panel. His goal LDL cholesterol is less than 70 given his history of ASCVD. I will also check a hemoglobin A1c. Ideally I like his hemoglobin A1c less than 7. Strongly recommended he start exercising 30 minutes a day 5 days a week and restrict his calorie intake to try to achieve 20-30 pounds weight loss gradually over the next 6 months.  12/10/17 HgA1c at that time was elevated at 8.4.  Patient is here for follow up.  He stopped his jardiance since I saw him.  Still on glucotrol, lantus 32 units a day, and metformin 1000 bid. The patient discontinue Jardiance due to polyuria.  He states his blood sugars were averaging between 120 and 145 however since discontinuation of the medication they are much higher. He is not watching his diet and he is not exercising. He is also overdue for a diabetic eye exam. He denies any neuropathy in his feet. He denies any blurry vision. He denies any chest pain shortness of breath or dyspnea on exertion. He denies any  myalgias or right upper quadrant pain. At that time, my plan was: Blood sugar today is adequately controlled. I will check a fasting lipid panel. Given his history of diabetes and coronary artery disease, his goal LDL cholesterol is less than 70. Given his history of retinopathy, I recommended he follow-up with his ophthalmologist as he has not seen his ophthalmologist at 16 months. I recommended checking his hemoglobin A1c. If greater than 8 as anticipated, I would recommend starting the patient on Victoza.  Flu shot is up-to-date. Diabetic foot exam is performed today  01/18/18 Please see labs.  Hemoglobin A1c was 9.3.  Therefore we added Victoza 1.2 mg daily and asked the patient to return in 1 month for recheck.  He brings in his sugars today and I am pleasantly surprised.  His fasting sugars in the morning are averaging between 70 and 147 with the vast majority 80-120.  His evening sugars are averaging 99-141 with the vast majority between 101 130.  These are outstanding.  He denies any nausea or vomiting.  His polyuria has resolved.  He is lost 5 pounds and strongly work to change his diet and is starting to exercise more Past Medical History:  Diagnosis Date  . Arthritis   . Coronary artery disease LAST CARDIOLOGIST VISIT W/ Erik Hancock 3 YRS AGO   [name]  . Diabetes mellitus   . Diabetic retinopathy (HCC)   . GERD (gastroesophageal reflux disease)   . High cholesterol   . Hypertension   . Left ureteral calculus   . Nephrolithiasis   . Prostatitis   . Pulmonary nodule 2013   CT  . S/P primary angioplasty with coronary stent   . Sleep apnea    stopbang=5   Past Surgical History:  Procedure Laterality Date  . CARDIAC CATHETERIZATION  JUNE 2003   PATENT LAD STENT/ BODERLINE OBSTRUCTIVE DISEASE POSTERIOR DESCENDING ARTERY/ NORMAL LVF  . CERVICAL SCOVILLE FORAMINOTOMY W/ EXCISION OF HERNIATED NUCLEC PULPOSUS  2009   C6 - 7  . CORONARY  ANGIOPLASTY WITH STENT PLACEMENT  03-30-2006  Erik Hancock   90% LEFT CIRMCUMFLEX  --  DE STENT X1 /  95% POSTERIOR DESCENDING ARTERY--- DE X1 STENT/ PATENT LEFT ANTERIOR DESCENDING STENT/ LOW NORMAL LVF/  . CORONARY ANGIOPLASTY WITH STENT PLACEMENT  JAN 2003--  Erik Hancock   DE STENT TO LAD  . CYSTOSCOPY W/ RETROGRADES  05/04/2012   Procedure: CYSTOSCOPY WITH RETROGRADE PYELOGRAM;  Surgeon: Milford Cage, MD;  Location: [facility];  Service: Urology;  Laterality: Left;  . CYSTOSCOPY W/ URETERAL STENT PLACEMENT  05/04/2012   Procedure: CYSTOSCOPY WITH STENT REPLACEMENT;  Surgeon: Milford Cage, MD;  Location: [facility];  Service: Urology;  Laterality: Left;  . cystoscopy with left stent placement  04-04-2012  . CYSTOSCOPY WITH URETEROSCOPY  05/04/2012   Procedure: CYSTOSCOPY WITH  URETEROSCOPY;  Surgeon: Milford Cage, MD;  Location: [facility];  Service: Urology;  Laterality: Left;      . LEFT URETEROSCOPIC STONE EXTRACTION  03-14-2003   X2   Current Outpatient Medications on File Prior to Visit  Medication Sig Dispense Refill  . ACCU-CHEK COMPACT PLUS test strip USE TO CHECK THREE TIMES A DAY 300 each 2  . amLODipine (NORVASC) 10 MG tablet TAKE 1 TABLET BY MOUTH ONCE DAILY 90 tablet 1  . aspirin 81 MG tablet Take 81 mg by mouth daily.    . clopidogrel (PLAVIX) 75 MG tablet TAKE ONE TABLET BY MOUTH ONCE DAILY 30 tablet 0  . glipiZIDE (GLUCOTROL) 10 MG tablet TAKE 1 TABLET BY MOUTH ONCE DAILY 90 tablet 1  . glucose blood (ACCU-CHEK AVIVA) test strip Check bs bid - tid 100 each 12  . Insulin Pen Needle 31G X 8 MM MISC Use daily with Victoza 100 each 3  . JARDIANCE 25 MG TABS tablet TAKE 1 TABLET BY MOUTH ONCE DAILY 90 tablet 1  . LANTUS SOLOSTAR 100 UNIT/ML Solostar Pen INJECT 32 UNITS UNDER THE SKIN EVERY MORNING 30 mL 1  . liraglutide (VICTOZA) 18 MG/3ML SOPN Inject 0.2 mLs (1.2 mg total) into the skin daily. 3 pen 2  . lisinopril (PRINIVIL,ZESTRIL) 40 MG tablet TAKE 1 TABLET BY  MOUTH ONCE DAILY 90 tablet 0  . metFORMIN (GLUCOPHAGE) 1000 MG tablet TAKE 1 TABLET BY MOUTH TWICE DAILY 180 tablet 1  . metoprolol succinate (TOPROL-XL) 25 MG 24 hr tablet TAKE 1 TABLET BY MOUTH ONCE DAILY 90 tablet 0  . pantoprazole (PROTONIX) 40 MG tablet Take 1 tablet (40 mg total) by mouth daily. 90 tablet 3  . simvastatin (ZOCOR) 20 MG tablet Take 1 tablet (20 mg total) by mouth daily with breakfast. 90 tablet 1  . sucralfate (CARAFATE) 1 g tablet Take 1 g by mouth 4 (four) times daily.  0  . valACYclovir (VALTREX) 1000 MG tablet Take 1 tablet (1,000 mg total) by mouth 3 (three) times daily. 21 tablet 0   No current facility-administered medications on file prior to visit.    No Known Allergies Social History   Socioeconomic History  . Marital status: Married    Spouse name: Not on file  . Number of children: Not on file  . Years of education: Not on file  . Highest education level: Not on file  Social Needs  . Financial resource strain: Not on file  . Food insecurity - worry: Not on file  . Food insecurity - inability: Not on file  . Transportation needs - medical: Not on file  . Transportation needs - non-medical: Not on file  Occupational History  . Not on file  Tobacco Use  . Smoking status: Never Smoker  . Smokeless tobacco: Never Used  Substance and Sexual Activity  . Alcohol use: No  . Drug use: No  . Sexual activity: Yes  Other Topics Concern  . Not on file  Social History Narrative  . Not on file      Review of Systems  All other systems reviewed and are negative.      Objective:   Physical Exam  Constitutional: He appears well-developed and well-nourished.  Neck: Neck supple. No JVD present. No thyromegaly present.  Cardiovascular: Normal rate, regular rhythm, normal heart sounds and intact distal pulses.  No murmur heard. Pulmonary/Chest: Effort normal and breath sounds normal. No respiratory distress. He has no wheezes. He has no rales. He  exhibits no tenderness.  Abdominal: Soft. Bowel sounds are normal. He exhibits no distension and no mass. There is no tenderness. There is no rebound and no guarding.  Musculoskeletal: He exhibits no edema.  Lymphadenopathy:    He has no cervical adenopathy.  Skin: No rash noted. No erythema.  Vitals reviewed.         Assessment & Plan:  DM2- I am extremely proud of this patient.  His sugars have improved dramatically and he is losing weight.  Continue Victoza 1.2 mg subcu daily and recheck hemoglobin A1c and fasting lab work in 3 months.  I congratulated him on his success thus far.

## 2018-02-18 ENCOUNTER — Other Ambulatory Visit: Payer: Self-pay | Admitting: Family Medicine

## 2018-02-18 MED ORDER — LIRAGLUTIDE 18 MG/3ML ~~LOC~~ SOPN: 1.2000 mg | PEN_INJECTOR | Freq: Every day | SUBCUTANEOUS | 2 refills | Status: DC

## 2018-03-11 ENCOUNTER — Ambulatory Visit: Payer: Medicare Other | Admitting: Family Medicine

## 2018-03-14 ENCOUNTER — Ambulatory Visit: Payer: Medicare Other | Admitting: Family Medicine

## 2018-03-18 ENCOUNTER — Other Ambulatory Visit: Payer: Self-pay | Admitting: Family Medicine

## 2018-04-19 ENCOUNTER — Ambulatory Visit: Payer: Medicare Other | Admitting: Family Medicine

## 2018-04-28 ENCOUNTER — Other Ambulatory Visit: Payer: Self-pay | Admitting: Family Medicine

## 2018-06-20 ENCOUNTER — Other Ambulatory Visit: Payer: Self-pay | Admitting: Family Medicine

## 2018-07-04 ENCOUNTER — Other Ambulatory Visit: Payer: Self-pay | Admitting: Family Medicine

## 2018-07-19 ENCOUNTER — Encounter: Payer: Self-pay | Admitting: Family Medicine

## 2018-07-19 ENCOUNTER — Ambulatory Visit: Payer: Medicare Other | Admitting: Family Medicine

## 2018-07-19 VITALS — BP 144/78 | HR 60 | Temp 98.1°F | Resp 22 | Ht 69.5 in | Wt 290.0 lb

## 2018-07-19 DIAGNOSIS — Z9861 Coronary angioplasty status: Secondary | ICD-10-CM

## 2018-07-19 DIAGNOSIS — I209 Angina pectoris, unspecified: Secondary | ICD-10-CM

## 2018-07-19 DIAGNOSIS — R0609 Other forms of dyspnea: Secondary | ICD-10-CM

## 2018-07-19 DIAGNOSIS — E118 Type 2 diabetes mellitus with unspecified complications: Secondary | ICD-10-CM

## 2018-07-19 DIAGNOSIS — IMO0002 Reserved for concepts with insufficient information to code with codable children: Secondary | ICD-10-CM

## 2018-07-19 DIAGNOSIS — I2510 Atherosclerotic heart disease of native coronary artery without angina pectoris: Secondary | ICD-10-CM

## 2018-07-19 DIAGNOSIS — E1165 Type 2 diabetes mellitus with hyperglycemia: Secondary | ICD-10-CM

## 2018-07-19 MED ORDER — ISOSORBIDE MONONITRATE ER 30 MG PO TB24: 30.0000 mg | ORAL_TABLET | Freq: Every day | ORAL | 6 refills | Status: DC

## 2018-07-19 MED ORDER — ATORVASTATIN CALCIUM 80 MG PO TABS: 80.0000 mg | ORAL_TABLET | Freq: Every day | ORAL | 3 refills | Status: DC

## 2018-07-19 NOTE — Progress Notes (Signed)
Subjective:    Patient ID: Erik Hancock, male    [place]don NewportOB: 1951/07/07, 67 y.o.   MRN: [id number]  Medication Refill    01/2017 Patient has not been seen in approximately a year. Overall he says he's been doing well. He admits that recently his sugars a been running higher as high as 200. He denies any polyuria or polydipsia. He does have blurry vision but he sees an eye specialist. He has been receiving injections in his eye due to neovascularization from diabetic retinopathy care of Dr. Ashley RoyaltyMatthews for the last several years. He is also recently had cataract surgery. Denies any chest pain shortness of breath or dyspnea on exertion. He does have a history of drug-eluting stents 3. He is still on dual antiplatelet therapy. However he has not seen a cardiologist in quite some time and therefore I believe that this is an oversight on my part area and I encouraged him to see his cardiologist for regular follow-up and he denies any myalgias or right upper quadrant pain.  At that time, my plan was: Blood pressure is controlled. I will check a hemoglobin A1c, a lipid panel, urine microalbumin, and a CMP. Goal LDL cholesterol is less than 70. Goal hemoglobin A1c is less than 7. Given his history of coronary artery disease, I would recommend adding jardiance due to the reduction in cardiovascular mortality. I discussed this at length with the patient. He would be willing to consider this as long as his affordable. Strongly encouraged the patient to follow-up with his cardiologist. I believe he can likely stop the Plavix as he is been on this medication for several years now. I would like him to continue aspirin  05/10/17 A1c was 8.4 at that time and we added jardiance 25 mg poqday.  He is here for follow up.  He has lost 7 pounds since his last visit. However he admits that he has not changed his diet nor is he exercising. He does not have any sugar readings to compare. He does report 3 random sugars of 114, 144, and  180. Some are fasting. Some of those numbers are random. He does report increasing urine output since starting the new medication. He also reports trouble sleeping. His wife states that he snores at night and makes unusual gasping sounds and sleep. He reports hypersomnolence during the day. He falls asleep easily in a chair. He does not fall asleep driving. However he can easily fall asleep after eating a meal even in the middle of the day. He has been told in the past that he has sleep apnea after he recovered from anesthesia. However this is remote.  At that time, my plan was: I will schedule patient to me with a neurologist for a split-level sleep study to evaluate for obstructive sleep apnea. His hypersomnolence, his body habitus, and some of his other symptoms suggest a high likelihood of structure sleep apnea. His blood pressure today is well controlled at 110/68. He denies any chest pain shortness of breath or dyspnea on exertion. I will check a fasting lipid panel. His goal LDL cholesterol is less than 70 given his history of ASCVD. I will also check a hemoglobin A1c. Ideally I like his hemoglobin A1c less than 7. Strongly recommended he start exercising 30 minutes a day 5 days a week and restrict his calorie intake to try to achieve 20-30 pounds weight loss gradually over the next 6 months.  12/10/17 HgA1c at that time was elevated at 8.4.  Patient is here for follow up.  He stopped his jardiance since I saw him.  Still on glucotrol, lantus 32 units a day, and metformin 1000 bid. The patient discontinue Jardiance due to polyuria.  He states his blood sugars were averaging between 120 and 145 however since discontinuation of the medication they are much higher. He is not watching his diet and he is not exercising. He is also overdue for a diabetic eye exam. He denies any neuropathy in his feet. He denies any blurry vision. He denies any chest pain shortness of breath or dyspnea on exertion. He denies any  myalgias or right upper quadrant pain. At that time, my plan was: Blood sugar today is adequately controlled. I will check a fasting lipid panel. Given his history of diabetes and coronary artery disease, his goal LDL cholesterol is less than 70. Given his history of retinopathy, I recommended he follow-up with his ophthalmologist as he has not seen his ophthalmologist at 16 months. I recommended checking his hemoglobin A1c. If greater than 8 as anticipated, I would recommend starting the patient on Victoza.  Flu shot is up-to-date. Diabetic foot exam is performed today  01/18/18 Please see labs.  Hemoglobin A1c was 9.3.  Therefore we added Victoza 1.2 mg daily and asked the patient to return in 1 month for recheck.  He brings in his sugars today and I am pleasantly surprised.  His fasting sugars in the morning are averaging between 70 and 147 with the vast majority 80-120.  His evening sugars are averaging 99-141 with the vast majority between 101 130.  These are outstanding.  He denies any nausea or vomiting.  His polyuria has resolved.  He is lost 5 pounds and strongly work to change his diet and is starting to exercise more.  At that time, my plan was: I am extremely proud of this patient.  His sugars have improved dramatically and he is losing weight.  Continue Victoza 1.2 mg subcu daily and recheck hemoglobin A1c and fasting lab work in 3 months.  I congratulated him on his success thus far.  07/19/18 Patient has not followed up as planned in May.  Furthermore he is not regularly checking his sugars.  This morning his fasting sugar was in the 90s however he also reports occasional sugars in the 180s.  I question how often he is actually been checking his sugars.  However he is being compliant with big toes a.  He is gained a pound since his last visit.  However the reason he made his visit has been worsening dyspnea on exertion.  Symptoms began approximately 1 month ago.  He states that whenever he is  outside working, especially in the hot air, he has a very difficult time catching his breath.  It is starting to occur even with minimal activity.  Furthermore he will break into a sweat.  Occasionally he will develop sharp pains on the left side of his chest that will radiate into his left arm.  These go away with rest.  Pattern of increasing dyspnea on exertion and chest pain with activity radiating into the left arm is concerning for angina.  He denies any chest pain at rest.  He denies any nausea or vomiting however he is concerned that he feels extremely weak and tired and short of breath with minimal activity Past Medical History:  Diagnosis Date  . Arthritis   . Coronary artery disease LAST CARDIOLOGIST VISIT W/ DR Gloris Manchester TURNER 3 YRS AGO  [name]  . Diabetes mellitus   . Diabetic retinopathy (HCC)   . GERD (gastroesophageal reflux disease)   . High cholesterol   . Hypertension   . Left ureteral calculus   . Nephrolithiasis   . Prostatitis   . Pulmonary nodule 2013   CT  . S/P primary angioplasty with coronary stent   . Sleep apnea    stopbang=5   Past Surgical History:  Procedure Laterality Date  . CARDIAC CATHETERIZATION  JUNE 2003   PATENT LAD STENT/ BODERLINE OBSTRUCTIVE DISEASE POSTERIOR DESCENDING ARTERY/ NORMAL LVF  . CERVICAL SCOVILLE FORAMINOTOMY W/ EXCISION OF HERNIATED NUCLEC PULPOSUS  2009   C6 - 7  . CORONARY ANGIOPLASTY WITH STENT PLACEMENT  03-30-2006  DR EDMUNDS   90% LEFT CIRMCUMFLEX  --  DE STENT X1 /  95% POSTERIOR DESCENDING ARTERY--- DE X1 STENT/ PATENT LEFT ANTERIOR DESCENDING STENT/ LOW NORMAL LVF/  . CORONARY ANGIOPLASTY WITH STENT PLACEMENT  JAN 2003--  DR TURNER   DE STENT TO LAD  . CYSTOSCOPY W/ RETROGRADES  05/04/2012   Procedure: CYSTOSCOPY WITH RETROGRADE PYELOGRAM;  Surgeon: Milford Cageaniel Young Woodruff, MD;  Location: [facility];  Service: Urology;  Laterality: Left;  . CYSTOSCOPY W/ URETERAL STENT PLACEMENT  05/04/2012    Procedure: CYSTOSCOPY WITH STENT REPLACEMENT;  Surgeon: Milford Cageaniel Young Woodruff, MD;  Location: [facility];  Service: Urology;  Laterality: Left;  . cystoscopy with left stent placement  04-04-2012  . CYSTOSCOPY WITH URETEROSCOPY  05/04/2012   Procedure: CYSTOSCOPY WITH URETEROSCOPY;  Surgeon: Milford Cageaniel Young Woodruff, MD;  Location: [facility];  Service: Urology;  Laterality: Left;      . LEFT URETEROSCOPIC STONE EXTRACTION  03-14-2003   X2   Current Outpatient Medications on File Prior to Visit  Medication Sig Dispense Refill  . ACCU-CHEK COMPACT PLUS test strip USE TO CHECK THREE TIMES A DAY 300 each 2  . amLODipine (NORVASC) 10 MG tablet TAKE 1 TABLET BY MOUTH ONCE DAILY 90 tablet 1  . aspirin 81 MG tablet Take 81 mg by mouth daily.    . clopidogrel (PLAVIX) 75 MG tablet TAKE ONE TABLET BY MOUTH ONCE DAILY 30 tablet 0  . glipiZIDE (GLUCOTROL) 10 MG tablet TAKE 1 TABLET BY MOUTH ONCE DAILY 90 tablet 1  . glucose blood (ACCU-CHEK AVIVA) test strip Check bs bid - tid 100 each 12  . Insulin Pen Needle 31G X 8 MM MISC Use daily with Victoza 100 each 3  . JARDIANCE 25 MG TABS tablet TAKE 1 TABLET BY MOUTH ONCE DAILY 90 tablet 0  . LANTUS SOLOSTAR 100 UNIT/ML Solostar Pen INJECT 32 UNITS UNDER THE SKIN EVERY MORNING 30 mL 3  . liraglutide (VICTOZA) 18 MG/3ML SOPN Inject 0.2 mLs (1.2 mg total) into the skin daily. 18 pen 2  . lisinopril (PRINIVIL,ZESTRIL) 40 MG tablet TAKE 1 TABLET BY MOUTH ONCE DAILY 90 tablet 0  . metFORMIN (GLUCOPHAGE) 1000 MG tablet TAKE 1 TABLET BY MOUTH TWICE DAILY 180 tablet 0  . metoprolol succinate (TOPROL-XL) 25 MG 24 hr tablet TAKE 1 TABLET BY MOUTH ONCE DAILY 90 tablet 0  . pantoprazole (PROTONIX) 40 MG tablet TAKE 1 TABLET BY MOUTH ONCE DAILY 90 tablet 0  . simvastatin (ZOCOR) 20 MG tablet Take 1 tablet (20 mg total) by mouth daily with breakfast. 90 tablet 1  . sucralfate (CARAFATE) 1 g tablet Take 1 g by mouth 4 (four) times daily.  0  . valACYclovir (VALTREX) 1000 MG  tablet Take 1 tablet (1,000 mg total) by mouth 3 (three) times daily. 21 tablet 0   No current facility-administered medications on file prior to visit.    No Known Allergies Social History   Socioeconomic History  . Marital status: Married    Spouse name: Not on file  . Number of children: Not on file  . Years of education: Not on file  . Highest education level: Not on file  Occupational History  . Not on file  Social Needs  . Financial resource strain: Not on file  . Food insecurity:    Worry: Not on file    Inability: Not on file  . Transportation needs:    Medical: Not on file    Non-medical: Not on file  Tobacco Use  . Smoking status: Never Smoker  . Smokeless tobacco: Never Used  Substance and Sexual Activity  . Alcohol use: No  . Drug use: No  . Sexual activity: Yes  Lifestyle  . Physical activity:    Days per week: Not on file    Minutes per session: Not on file  . Stress: Not on file  Relationships  . Social connections:    Talks on phone: Not on file    Gets together: Not on file    Attends religious service: Not on file    Active member of club or organization: Not on file    Attends meetings of clubs or organizations: Not on file    Relationship status: Not on file  . Intimate partner violence:    Fear of current or ex partner: Not on file    Emotionally abused: Not on file    Physically abused: Not on file    Forced sexual activity: Not on file  Other Topics Concern  . Not on file  Social History Narrative  . Not on file      Review of Systems  All other systems reviewed and are negative.      Objective:   Physical Exam  Constitutional: He appears well-developed and well-nourished.  Neck: Neck supple. No JVD present. No thyromegaly present.  Cardiovascular: Normal rate, regular rhythm, normal heart sounds and intact distal pulses.  No murmur heard. Pulmonary/Chest: Effort normal and breath sounds normal. No respiratory distress. He has  no wheezes. He has no rales. He exhibits no tenderness.  Abdominal: Soft. Bowel sounds are normal. He exhibits no distension and no mass. There is no tenderness. There is no rebound and no guarding.  Musculoskeletal: He exhibits no edema.  Lymphadenopathy:    He has no cervical adenopathy.  Skin: No rash noted. No erythema.  Vitals reviewed.         Assessment & Plan:   Dyspnea on exertion - Plan: EKG 12-Lead, Ambulatory referral to Cardiology  Diabetes mellitus type 2, uncontrolled, with complications (HCC) - Plan: CBC with Differential/Platelet, COMPLETE METABOLIC PANEL WITH GFR, Lipid panel, Hemoglobin A1c  CAD S/P percutaneous coronary angioplasty - Plan: Ambulatory referral to Cardiology  Angina pectoris [facility]) - Plan: Ambulatory referral to Cardiology  On exam today, there is no JVD.  There are no crackles on pulmonary exam.  There is no pitting edema in his extremities.  He does not appear to be fluid overloaded.  His lungs are clear to auscultation bilaterally.  My concern is that the patient is demonstrating signs of angina with increasing dyspnea on exertion.  Therefore I believe he needs to see his cardiologist as soon as possible for a stress test  and possibly an echocardiogram.  In the meantime, I will check his lab work to monitor his risk factors including a CBC, fasting lipid panel, CMP, and hemoglobin A1c.  His goal LDL will be less than 60.  His goal hemoglobin A1c would be less than 7.  I will also add Imdur 30 mg poqday to see if this helps with his dyspnea on exertion and exertional chest pain.  I have cautioned the patient to be extremely cautious until he sees his cardiologist and not to aggressively push his activity until after he has had a stress test.  He is already on dual antiplatelet agent, aspirin and Plavix.  He is on Toprol-XL 25 mg a day and his heart rate is 60 bpm.  However I will discontinue simvastatin and replacing with Lipitor 80 mg a day for plaque  stabilization as well as to optimize his statin.  EKG shows sinus rhythm with first-degree AV block.  There are Q waves in leads aVF and III.  Otherwise there are no ST-T segment changes.

## 2018-07-20 LAB — COMPLETE METABOLIC PANEL WITH GFR
AG Ratio: 1.4 (calc) (ref 1.0–2.5)
ALKALINE PHOSPHATASE (APISO): 61 U/L (ref 40–115)
ALT: 40 U/L (ref 9–46)
AST: 34 U/L (ref 10–35)
Albumin: 4.1 g/dL (ref 3.6–5.1)
BILIRUBIN TOTAL: 0.6 mg/dL (ref 0.2–1.2)
BUN / CREAT RATIO: 13 (calc) (ref 6–22)
BUN: 19 mg/dL (ref 7–25)
CHLORIDE: 103 mmol/L (ref 98–110)
CO2: 25 mmol/L (ref 20–32)
Calcium: 9.6 mg/dL (ref 8.6–10.3)
Creat: 1.45 mg/dL — ABNORMAL HIGH (ref 0.70–1.25)
GFR, Est African American: 58 mL/min/{1.73_m2} — ABNORMAL LOW (ref >=60)
GFR, Est Non African American: 50 mL/min/{1.73_m2} — ABNORMAL LOW (ref >=60)
Globulin: 2.9 g/dL (calc) (ref 1.9–3.7)
Glucose, Bld: 77 mg/dL (ref 65–99)
POTASSIUM: 5.7 mmol/L — AB (ref 3.5–5.3)
Sodium: 138 mmol/L (ref 135–146)
Total Protein: 7 g/dL (ref 6.1–8.1)

## 2018-07-20 LAB — LIPID PANEL
CHOLESTEROL: 129 mg/dL (ref <200)
HDL: 50 mg/dL (ref >40)
LDL Cholesterol (Calc): 59 mg/dL (calc)
Non-HDL Cholesterol (Calc): 79 mg/dL (calc) (ref <130)
TRIGLYCERIDES: 117 mg/dL (ref <150)
Total CHOL/HDL Ratio: 2.6 (calc) (ref <5.0)

## 2018-07-20 LAB — CBC WITH DIFFERENTIAL/PLATELET
BASOS ABS: 74 {cells}/uL (ref 0–200)
Basophils Relative: 0.9 %
Eosinophils Absolute: 328 cells/uL (ref 15–500)
Eosinophils Relative: 4 %
HCT: 43.6 % (ref 38.5–50.0)
Hemoglobin: 14.4 g/dL (ref 13.2–17.1)
Lymphs Abs: 2066 cells/uL (ref 850–3900)
MCH: 28.3 pg (ref 27.0–33.0)
MCHC: 33 g/dL (ref 32.0–36.0)
MCV: 85.7 fL (ref 80.0–100.0)
MONOS PCT: 8.1 %
MPV: 8.7 fL (ref 7.5–12.5)
NEUTROS PCT: 61.8 %
Neutro Abs: 5068 cells/uL (ref 1500–7800)
PLATELETS: 352 10*3/uL (ref 140–400)
RBC: 5.09 10*6/uL (ref 4.20–5.80)
RDW: 13.7 % (ref 11.0–15.0)
TOTAL LYMPHOCYTE: 25.2 %
WBC mixed population: 664 cells/uL (ref 200–950)
WBC: 8.2 10*3/uL (ref 3.8–10.8)

## 2018-07-20 LAB — HEMOGLOBIN A1C
HEMOGLOBIN A1C: 7.7 %{Hb} — AB (ref <5.7)
Mean Plasma Glucose: 174 (calc)
eAG (mmol/L): 9.7 (calc)

## 2018-07-26 ENCOUNTER — Encounter: Payer: Self-pay | Admitting: Cardiovascular Disease

## 2018-07-26 ENCOUNTER — Ambulatory Visit: Payer: Medicare Other | Admitting: Cardiovascular Disease

## 2018-07-26 VITALS — BP 116/65 | HR 72 | Ht 69.5 in | Wt 290.2 lb

## 2018-07-26 DIAGNOSIS — R0681 Apnea, not elsewhere classified: Secondary | ICD-10-CM

## 2018-07-26 DIAGNOSIS — R400 Somnolence: Secondary | ICD-10-CM | POA: Diagnosis not present

## 2018-07-26 DIAGNOSIS — I209 Angina pectoris, unspecified: Secondary | ICD-10-CM

## 2018-07-26 DIAGNOSIS — I2510 Atherosclerotic heart disease of native coronary artery without angina pectoris: Secondary | ICD-10-CM

## 2018-07-26 DIAGNOSIS — Z01812 Encounter for preprocedural laboratory examination: Secondary | ICD-10-CM | POA: Diagnosis not present

## 2018-07-26 DIAGNOSIS — R0609 Other forms of dyspnea: Secondary | ICD-10-CM

## 2018-07-26 NOTE — Patient Instructions (Addendum)
Medication Instructions:  Your physician recommends that you continue on your current medications as directed. Please refer to the Current Medication list given to you today.  Labwork: BMET/CBC  Testing/Procedures: Your physician has recommended that you have a sleep study. This test records several body functions during sleep, including: brain activity, eye movement, oxygen and carbon dioxide blood levels, heart rate and rhythm, breathing rate and rhythm, the flow of air through your mouth and nose, snoring, body muscle movements, and chest and belly movement. ONCE INSURANCE HAS APPROVED WE WILL CALL YOU TO SCHEDULE. IF YOU DO NOT HERE FROM THE OFFICE IN 2 WEEKS CALL [phone]  Your physician has requested that you have a cardiac catheterization. Cardiac catheterization is used to diagnose and/or treat various heart conditions. Doctors may recommend this procedure for a number of different reasons. The most common reason is to evaluate chest pain. Chest pain can be a symptom of coronary artery disease (CAD), and cardiac catheterization can show whether plaque is narrowing or blocking your heart's arteries. This procedure is also used to evaluate the valves, as well as measure the blood flow and oxygen levels in different parts of your heart. For further information please visit [email or web address] Please follow instruction sheet, as given.  Follow-Up: Your physician recommends that you schedule a follow-up appointment in: 2 MONTHS   Any Other Special Instructions Will Be Listed Below (If Applicable).     [facility] CARDIOVASCULAR DIVISION [facility] [address] [place] [address] Dept: [phone] Loc: [phone]  Erik Hancock  07/26/2018  You are scheduled for a Cardiac Catheterization on Friday, September 6 with Dr. Bryan Lemma.  1. Please arrive at the [facility] (Main Entrance A) at [facility]: [address] [place], [place] [address] at 5:30 AM (This time is two hours before your procedure to ensure your preparation). Free valet parking service is available.   Special note: Every effort is made to have your procedure done on time. Please understand that emergencies sometimes delay scheduled procedures.  2. Diet: Do not eat solid foods after midnight.  The patient may have clear liquids until 5am upon the day of the procedure.  3. Labs: You will need to have blood drawn on TODAY   4. Medication instructions in preparation for your procedure:   Contrast Allergy: No  DO NOT TAKE ANY DIABETIC MEDICATIONS MORNING OF  ONLY TAKE 1/2 DOSE OF LANTUS NIGHT BEFORE  NO METFORMIN 48 HOURS AFTER PROCEDURE   NO LISINOPRIL DAY BEFORE OR MORNING OF PROCEDURE   On the morning of your procedure, take your Aspirin and any morning medicines NOT listed above.  You may use sips of water.  5. Plan for one night stay--bring personal belongings. 6. Bring a current list of your medications and current insurance cards. 7. You MUST have a responsible person to drive you home. 8. Someone MUST be with you the first 24 hours after you arrive home or your discharge will be delayed. 9. Please wear clothes that are easy to get on and off and wear slip-on shoes.  Thank you for allowing [place] to care for you!   --  Invasive Cardiovascular services

## 2018-07-26 NOTE — Progress Notes (Signed)
  Cardiology Office Note   Date:  07/26/2018   ID:  Erik Hancock, DOB 10/28/1951, MRN [id number]  PCP:  Erik, [name], MD  Cardiologist:   Erik Gruenhagen Sultan, MD   No chief complaint on file.     History of Present Illness: Erik Hancock is a 67 y.o. male with CAD s/p PCI, diabetes, hypertension and hyperlipidemia who is being seen today to establish care at the request of Erik, [name], MD.  Erik Hancock reports that lately he has been more short of breath when he exerts himself.  It is especially bad when the weather is hot outside.  His symptoms have been progressive over the last year.  He gets extremely short of breath and starts feeling like his heart beat is beating hard in his head.  There is no associated chest pain but he does get discomfort radiating into his left arm.  The left arm also becomes numb.  The episodes happen every time is exerts itself and last for approximately 5 minutes after he sits down and rests.  The symptoms are similar to when he previously required coronary stenting.  In 2012 Erik Hancock had angina and underwent PCI of the LAD.  He had no other CAD.   He had an echo 01/2014 that revealed LVEF 60-65% and an moderately dilated RV.   Erik Hancock reports mild LE edema that improves with elevation of his legs.  His legs hurt when lying in bed but not with walking.  He denies orthopnea but sleeps with the head of his bed elevated.  He snores loudly and his wife reports that he has apneic episodes.  He does not feel well-rested in the morning and has daytime somnolence.  He is retired from working with school buses.  He does not get much exercise.  His father had a heart attack in his 60s.    Past Medical History:  Diagnosis Date  . Arthritis   . Coronary artery disease [name]   [name]  . Diabetes mellitus   . Diabetic retinopathy (HCC)   . GERD (gastroesophageal reflux  disease)   . High cholesterol   . Hypertension   . Left ureteral calculus   . Nephrolithiasis   . Prostatitis   . Pulmonary nodule 2013   CT  . S/P primary angioplasty with coronary stent   . Sleep apnea    stopbang=5    Past Surgical History:  Procedure Laterality Date  . CARDIAC CATHETERIZATION  JUNE 2003   PATENT LAD STENT/ BODERLINE OBSTRUCTIVE DISEASE POSTERIOR DESCENDING ARTERY/ NORMAL LVF  . CERVICAL SCOVILLE FORAMINOTOMY W/ EXCISION OF HERNIATED NUCLEC PULPOSUS  2009   C6 - 7  . CORONARY ANGIOPLASTY WITH STENT PLACEMENT  03-30-2006  DR EDMUNDS   90% LEFT CIRMCUMFLEX  --  DE STENT X1 /  95% POSTERIOR DESCENDING ARTERY--- DE X1 STENT/ PATENT LEFT ANTERIOR DESCENDING STENT/ LOW NORMAL LVF/  . CORONARY ANGIOPLASTY WITH STENT PLACEMENT  JAN 2003--  DR TURNER   DE STENT TO LAD  . CYSTOSCOPY W/ RETROGRADES  05/04/2012   Procedure: CYSTOSCOPY WITH RETROGRADE PYELOGRAM;  Surgeon: Erik Young Woodruff, MD;  Location: [facility];  Service: Urology;  Laterality: Left;  . CYSTOSCOPY W/ URETERAL STENT PLACEMENT  05/04/2012   Procedure: CYSTOSCOPY WITH STENT REPLACEMENT;  Surgeon: Erik Young Woodruff, MD;  Location: [facility];  Service: Urology;  Laterality: Left;  .   cystoscopy with left stent placement  04-04-2012  . CYSTOSCOPY WITH URETEROSCOPY  05/04/2012   Procedure: CYSTOSCOPY WITH URETEROSCOPY;  Surgeon: Erik Young Woodruff, MD;  Location: [facility];  Service: Urology;  Laterality: Left;      . LEFT URETEROSCOPIC STONE EXTRACTION  03-14-2003   X2     Current Outpatient Medications  Medication Sig Dispense Refill  . ACCU-CHEK COMPACT PLUS test strip USE TO CHECK THREE TIMES A DAY 300 each 2  . aspirin 81 MG tablet Take 81 mg by mouth daily.    . atorvastatin (LIPITOR) 80 MG tablet Take 1 tablet (80 mg total) by mouth daily. 90 tablet 3  . glipiZIDE (GLUCOTROL) 10 MG tablet TAKE 1 TABLET BY MOUTH ONCE DAILY 90 tablet 1  . glucose blood (ACCU-CHEK AVIVA) test strip Check bs bid - tid  100 each 12  . Insulin Pen Needle 31G X 8 MM MISC Use daily with Victoza 100 each 3  . isosorbide mononitrate (IMDUR) 30 MG 24 hr tablet Take 1 tablet (30 mg total) by mouth daily. 30 tablet 6  . JARDIANCE 25 MG TABS tablet TAKE 1 TABLET BY MOUTH ONCE DAILY 90 tablet 0  . LANTUS SOLOSTAR 100 UNIT/ML Solostar Pen INJECT 32 UNITS UNDER THE SKIN EVERY MORNING 30 mL 3  . liraglutide (VICTOZA) 18 MG/3ML SOPN Inject 0.2 mLs (1.2 mg total) into the skin daily. 18 pen 2  . lisinopril (PRINIVIL,ZESTRIL) 40 MG tablet TAKE 1 TABLET BY MOUTH ONCE DAILY 90 tablet 0  . metFORMIN (GLUCOPHAGE) 1000 MG tablet TAKE 1 TABLET BY MOUTH TWICE DAILY 180 tablet 0  . metoprolol succinate (TOPROL-XL) 25 MG 24 hr tablet TAKE 1 TABLET BY MOUTH ONCE DAILY 90 tablet 0  . pantoprazole (PROTONIX) 40 MG tablet TAKE 1 TABLET BY MOUTH ONCE DAILY 90 tablet 0   No current facility-administered medications for this visit.     Allergies:   Patient has no known allergies.    Social History:  The patient  reports that he has never smoked. He has never used smokeless tobacco. He reports that he does not drink alcohol or use drugs.   Family History:  The patient's family history includes Heart attack in his father and paternal grandfather; Heart disease in his father and paternal grandfather; Hypertension in his mother.    ROS:  Please see the history of present illness.   Otherwise, review of systems are positive for none.   All other systems are reviewed and negative.    PHYSICAL EXAM: VS:  BP 116/65 (BP Location: Right Arm)   Pulse 72   Ht 5' 9.5" (1.765 m)   Wt 290 lb 3.2 oz (131.6 kg)   BMI 42.24 kg/m  , BMI Body mass index is 42.24 kg/m. GENERAL:  Well appearing HEENT:  Pupils equal round and reactive, fundi not visualized, oral mucosa unremarkable NECK:  No jugular venous distention, waveform within normal limits, carotid upstroke brisk and symmetric, no bruits LUNGS:  Clear to auscultation bilaterally HEART:  RRR.   PMI not displaced or sustained,S1 and S2 within normal limits, no S3, no S4, no clicks, no rubs, no murmurs ABD:  Flat, positive bowel sounds normal in frequency in pitch, no bruits, no rebound, no guarding, no midline pulsatile mass, no hepatomegaly, no splenomegaly EXT:  2 plus pulses throughout, no edema, no cyanosis no clubbing SKIN:  No rashes no nodules NEURO:  Cranial nerves II through XII grossly intact, motor grossly intact throughout PSYCH:  Cognitively intact, oriented   to person place and time   EKG:  EKG is not ordered today. The ekg ordered 07/19/18 demonstrates sinus rhythm at rate 60 bpm.  Prior inferior infarct.  Poor R wave progression.  LHC 04/08/11: PROCEDURES PERFORMED: 1. Percutaneous coronary intervention/stent implantation, mid LAD. 2. Percutaneous closure, right femoral artery/Perclose.  INDICATION: The patient is a 50-year-old man with diabetes, hypertension, and hypercholesterolemia, who has developed exertional angina. Dr. Turner has completed coronary angiography revealing a mid LAD stenosis of 80-90% involving a small to moderate sized diagonal branch. He is to undergo percutaneous intervention at this time for definitive revascularization.   Recent Labs: 07/19/2018: ALT 40; BUN 19; Creat 1.45; Hemoglobin 14.4; Platelets 352; Potassium 5.7; Sodium 138    Lipid Panel    Component Value Date/Time   CHOL 129 07/19/2018 1203   TRIG 117 07/19/2018 1203   HDL 50 07/19/2018 1203   CHOLHDL 2.6 07/19/2018 1203   VLDL 29 05/10/2017 0832   LDLCALC 59 07/19/2018 1203      Wt Readings from Last 3 Encounters:  07/26/18 290 lb 3.2 oz (131.6 kg)  07/19/18 290 lb (131.5 kg)  01/18/18 289 lb (131.1 kg)      ASSESSMENT AND PLAN:  # Exertional dyspnea:  # CAD: Mr. Stephen's symptoms are concerning for angina.  Given that is similar to when he needed PCI in the past and his multiple risk factors, we will plan for LHC.  Continue aspirin, atorvastatin, Imdur,  and metoprolol.    # Apnea: # Snoring: Concerning for OSA.  Will get a sleep study.  # Hypertension: BP controlled on lisinopril an dmetoprolol.  # Hyperlipidemia: LDL 59 on 06/2018.  Continue atorvastatin.  # DM:  Consider an SGLT2 inhibitor for mortality benefit.   # Morbid obesity:  Will focus on exercise after cath.  Current medicines are reviewed at length with the patient today.  The patient does not have concerns regarding medicines.  The following changes have been made:  no change  Labs/ tests ordered today include:  No orders of the defined types were placed in this encounter.    Disposition:   FU with Shyah Cadmus C. [place], MD, FACC in 2 months.      Signed, Paddy Walthall C. [place], MD, FACC  07/26/2018 3:21 PM    [facility] 

## 2018-07-26 NOTE — H&P (View-Only) (Signed)
Cardiology Office Note   Date:  07/26/2018   ID:  Erik Hancock, DOB 02-27-1951, MRN [id number]  PCP:  Donita Brooks, MD  Cardiologist:   Chilton Si, MD   No chief complaint on file.     History of Present Illness: Erik Hancock is a 67 y.o. male with CAD s/p PCI, diabetes, hypertension and hyperlipidemia who is being seen today to establish care at the request of Erik Ferrier T, MD.  ErikHancock reports that lately he has been more short of breath when he exerts himself.  It is especially bad when the weather is hot outside.  His symptoms have been progressive over the last year.  He gets extremely short of breath and starts feeling like his heart beat is beating hard in his head.  There is no associated chest pain but he does get discomfort radiating into his left arm.  The left arm also becomes numb.  The episodes happen every time is exerts itself and last for approximately 5 minutes after he sits down and rests.  The symptoms are similar to when he previously required coronary stenting.  In 2012 Erik Hancock had angina and underwent PCI of the LAD.  He had no other CAD.   He had an echo 01/2014 that revealed LVEF 60-65% and an moderately dilated RV.   Erik Hancock reports mild LE edema that improves with elevation of his legs.  His legs hurt when lying in bed but not with walking.  He denies orthopnea but sleeps with the head of his bed elevated.  He snores loudly and his wife reports that he has apneic episodes.  He does not feel well-rested in the morning and has daytime somnolence.  He is retired from working with school buses.  He does not get much exercise.  His father had a heart attack in his 69s.    Past Medical History:  Diagnosis Date  . Arthritis   . Coronary artery disease [name]   [name]  . Diabetes mellitus   . Diabetic retinopathy (HCC)   . GERD (gastroesophageal reflux  disease)   . High cholesterol   . Hypertension   . Left ureteral calculus   . Nephrolithiasis   . Prostatitis   . Pulmonary nodule 2013   CT  . S/P primary angioplasty with coronary stent   . Sleep apnea    stopbang=5    Past Surgical History:  Procedure Laterality Date  . CARDIAC CATHETERIZATION  JUNE 2003   PATENT LAD STENT/ BODERLINE OBSTRUCTIVE DISEASE POSTERIOR DESCENDING ARTERY/ NORMAL LVF  . CERVICAL SCOVILLE FORAMINOTOMY W/ EXCISION OF HERNIATED NUCLEC PULPOSUS  2009   C6 - 7  . CORONARY ANGIOPLASTY WITH STENT PLACEMENT  03-30-2006  DR EDMUNDS   90% LEFT CIRMCUMFLEX  --  DE STENT X1 /  95% POSTERIOR DESCENDING ARTERY--- DE X1 STENT/ PATENT LEFT ANTERIOR DESCENDING STENT/ LOW NORMAL LVF/  . CORONARY ANGIOPLASTY WITH STENT PLACEMENT  JAN 2003--  DR TURNER   DE STENT TO LAD  . CYSTOSCOPY W/ RETROGRADES  05/04/2012   Procedure: CYSTOSCOPY WITH RETROGRADE PYELOGRAM;  Surgeon: Milford Cage, MD;  Location: [facility];  Service: Urology;  Laterality: Left;  . CYSTOSCOPY W/ URETERAL STENT PLACEMENT  05/04/2012   Procedure: CYSTOSCOPY WITH STENT REPLACEMENT;  Surgeon: Milford Cage, MD;  Location: [facility];  Service: Urology;  Laterality: Left;  .  cystoscopy with left stent placement  04-04-2012  . CYSTOSCOPY WITH URETEROSCOPY  05/04/2012   Procedure: CYSTOSCOPY WITH URETEROSCOPY;  Surgeon: Milford Cage, MD;  Location: [facility];  Service: Urology;  Laterality: Left;      . LEFT URETEROSCOPIC STONE EXTRACTION  03-14-2003   X2     Current Outpatient Medications  Medication Sig Dispense Refill  . ACCU-CHEK COMPACT PLUS test strip USE TO CHECK THREE TIMES A DAY 300 each 2  . aspirin 81 MG tablet Take 81 mg by mouth daily.    [name] atorvastatin (LIPITOR) 80 MG tablet Take 1 tablet (80 mg total) by mouth daily. 90 tablet 3  . glipiZIDE (GLUCOTROL) 10 MG tablet TAKE 1 TABLET BY MOUTH ONCE DAILY 90 tablet 1  . glucose blood (ACCU-CHEK AVIVA) test strip Check bs bid - tid  100 each 12  . Insulin Pen Needle 31G X 8 MM MISC Use daily with Victoza 100 each 3  . isosorbide mononitrate (IMDUR) 30 MG 24 hr tablet Take 1 tablet (30 mg total) by mouth daily. 30 tablet 6  . JARDIANCE 25 MG TABS tablet TAKE 1 TABLET BY MOUTH ONCE DAILY 90 tablet 0  . LANTUS SOLOSTAR 100 UNIT/ML Solostar Pen INJECT 32 UNITS UNDER THE SKIN EVERY MORNING 30 mL 3  . liraglutide (VICTOZA) 18 MG/3ML SOPN Inject 0.2 mLs (1.2 mg total) into the skin daily. 18 pen 2  . lisinopril (PRINIVIL,ZESTRIL) 40 MG tablet TAKE 1 TABLET BY MOUTH ONCE DAILY 90 tablet 0  . metFORMIN (GLUCOPHAGE) 1000 MG tablet TAKE 1 TABLET BY MOUTH TWICE DAILY 180 tablet 0  . metoprolol succinate (TOPROL-XL) 25 MG 24 hr tablet TAKE 1 TABLET BY MOUTH ONCE DAILY 90 tablet 0  . pantoprazole (PROTONIX) 40 MG tablet TAKE 1 TABLET BY MOUTH ONCE DAILY 90 tablet 0   No current facility-administered medications for this visit.     Allergies:   Patient has no known allergies.    Social History:  The patient  reports that he has never smoked. He has never used smokeless tobacco. He reports that he does not drink alcohol or use drugs.   Family History:  The patient's family history includes Heart attack in his father and paternal grandfather; Heart disease in his father and paternal grandfather; Hypertension in his mother.    ROS:  Please see the history of present illness.   Otherwise, review of systems are positive for none.   All other systems are reviewed and negative.    PHYSICAL EXAM: VS:  BP 116/65 (BP Location: Right Arm)   Pulse 72   Ht 5' 9.5" (1.765 m)   Wt 290 lb 3.2 oz (131.6 kg)   BMI 42.24 kg/m  , BMI Body mass index is 42.24 kg/m. GENERAL:  Well appearing HEENT:  Pupils equal round and reactive, fundi not visualized, oral mucosa unremarkable NECK:  No jugular venous distention, waveform within normal limits, carotid upstroke brisk and symmetric, no bruits LUNGS:  Clear to auscultation bilaterally HEART:  RRR.   PMI not displaced or sustained,S1 and S2 within normal limits, no S3, no S4, no clicks, no rubs, no murmurs ABD:  Flat, positive bowel sounds normal in frequency in pitch, no bruits, no rebound, no guarding, no midline pulsatile mass, no hepatomegaly, no splenomegaly EXT:  2 plus pulses throughout, no edema, no cyanosis no clubbing SKIN:  No rashes no nodules NEURO:  Cranial nerves II through XII grossly intact, motor grossly intact throughout PSYCH:  Cognitively intact, oriented  to person place and time   EKG:  EKG is not ordered today. The ekg ordered 07/19/18 demonstrates sinus rhythm at rate 60 bpm.  Prior inferior infarct.  Poor R wave progression.  LHC 04/08/11: PROCEDURES PERFORMED: 1. Percutaneous coronary intervention/stent implantation, mid LAD. 2. Percutaneous closure, right femoral artery/Perclose.  INDICATION: The patient is a 67 year old man with diabetes, hypertension, and hypercholesterolemia, who has developed exertional angina. Dr. Mayford Knife has completed coronary angiography revealing a mid LAD stenosis of 80-90% involving a small to moderate sized diagonal branch. He is to undergo percutaneous intervention at this time for definitive revascularization.   Recent Labs: 07/19/2018: ALT 40; BUN 19; Creat 1.45; Hemoglobin 14.4; Platelets 352; Potassium 5.7; Sodium 138    Lipid Panel    Component Value Date/Time   CHOL 129 07/19/2018 1203   TRIG 117 07/19/2018 1203   HDL 50 07/19/2018 1203   CHOLHDL 2.6 07/19/2018 1203   VLDL 29 05/10/2017 0832   LDLCALC 59 07/19/2018 1203      Wt Readings from Last 3 Encounters:  07/26/18 290 lb 3.2 oz (131.6 kg)  07/19/18 290 lb (131.5 kg)  01/18/18 289 lb (131.1 kg)      ASSESSMENT AND PLAN:  # Exertional dyspnea:  # CAD: Mr. Hoppel's symptoms are concerning for angina.  Given that is similar to when he needed PCI in the past and his multiple risk factors, we will plan for LHC.  Continue aspirin, atorvastatin, Imdur,  and metoprolol.    # Apnea: # Snoring: Concerning for OSA.  Will get a sleep study.  # Hypertension: BP controlled on lisinopril an dmetoprolol.  # Hyperlipidemia: LDL 59 on 06/2018.  Continue atorvastatin.  # DM:  Consider an SGLT2 inhibitor for mortality benefit.   # Morbid obesity:  Will focus on exercise after cath.  Current medicines are reviewed at length with the patient today.  The patient does not have concerns regarding medicines.  The following changes have been made:  no change  Labs/ tests ordered today include:  No orders of the defined types were placed in this encounter.    Disposition:   FU with Emara Lichter C. Duke Salvia, MD, [facility] in 2 months.      Signed, Dmoni Fortson C. Duke Salvia, MD, [facility]  07/26/2018 3:21 PM    [facility]

## 2018-07-27 ENCOUNTER — Telehealth: Payer: Self-pay | Admitting: *Deleted

## 2018-07-27 ENCOUNTER — Encounter (HOSPITAL_COMMUNITY): Payer: Self-pay | Admitting: Cardiology

## 2018-07-27 LAB — BASIC METABOLIC PANEL
BUN/Creatinine Ratio: 15 (ref 10–24)
BUN: 24 mg/dL (ref 8–27)
CALCIUM: 9.9 mg/dL (ref 8.6–10.2)
CO2: 20 mmol/L (ref 20–29)
CREATININE: 1.57 mg/dL — AB (ref 0.76–1.27)
Chloride: 105 mmol/L (ref 96–106)
GFR calc Af Amer: 52 mL/min/{1.73_m2} — ABNORMAL LOW (ref >59)
GFR calc non Af Amer: 45 mL/min/{1.73_m2} — ABNORMAL LOW (ref >59)
GLUCOSE: 124 mg/dL — AB (ref 65–99)
Potassium: 5 mmol/L (ref 3.5–5.2)
SODIUM: 141 mmol/L (ref 134–144)

## 2018-07-27 LAB — CBC WITH DIFFERENTIAL/PLATELET
BASOS ABS: 0 10*3/uL (ref 0.0–0.2)
Basos: 1 %
EOS (ABSOLUTE): 0.3 10*3/uL (ref 0.0–0.4)
EOS: 4 %
HEMOGLOBIN: 14.1 g/dL (ref 13.0–17.7)
Hematocrit: 42.2 % (ref 37.5–51.0)
IMMATURE GRANS (ABS): 0 10*3/uL (ref 0.0–0.1)
Immature Granulocytes: 0 %
LYMPHS: 24 %
Lymphocytes Absolute: 1.9 10*3/uL (ref 0.7–3.1)
MCH: 28.5 pg (ref 26.6–33.0)
MCHC: 33.4 g/dL (ref 31.5–35.7)
MCV: 85 fL (ref 79–97)
MONOCYTES: 9 %
Monocytes Absolute: 0.7 10*3/uL (ref 0.1–0.9)
Neutrophils Absolute: 5.1 10*3/uL (ref 1.4–7.0)
Neutrophils: 62 %
Platelets: 325 10*3/uL (ref 150–450)
RBC: 4.95 x10E6/uL (ref 4.14–5.80)
RDW: 14 % (ref 12.3–15.4)
WBC: 8.2 10*3/uL (ref 3.4–10.8)

## 2018-07-27 NOTE — Telephone Encounter (Signed)
Patient's wife returned a call to me and was given patient's sleep study appointment details.

## 2018-07-27 NOTE — Telephone Encounter (Signed)
Left message to return a call to me. (sleep study appointment.)

## 2018-07-28 ENCOUNTER — Encounter: Payer: Self-pay | Admitting: Cardiovascular Disease

## 2018-07-28 ENCOUNTER — Encounter (HOSPITAL_COMMUNITY): Payer: Self-pay | Admitting: Cardiology

## 2018-07-28 DIAGNOSIS — R0681 Apnea, not elsewhere classified: Secondary | ICD-10-CM | POA: Insufficient documentation

## 2018-07-28 DIAGNOSIS — R0609 Other forms of dyspnea: Secondary | ICD-10-CM | POA: Insufficient documentation

## 2018-07-28 HISTORY — DX: Morbid (severe) obesity due to excess calories: E66.01

## 2018-07-29 ENCOUNTER — Encounter (HOSPITAL_COMMUNITY)
Admission: RE | Disposition: A | Discharge status: Home / Self Care | Payer: Self-pay | Source: Ambulatory Visit | Attending: Cardiology

## 2018-07-29 ENCOUNTER — Ambulatory Visit (HOSPITAL_COMMUNITY)
Admission: RE | Admit: 2018-07-29 | Discharge: 2018-07-31 | Disposition: A | Discharge status: Home / Self Care | Payer: Medicare Other | Source: Ambulatory Visit | Attending: Cardiology | Admitting: Cardiology

## 2018-07-29 ENCOUNTER — Encounter (HOSPITAL_COMMUNITY): Payer: Self-pay | Admitting: Cardiology

## 2018-07-29 ENCOUNTER — Other Ambulatory Visit: Payer: Self-pay

## 2018-07-29 DIAGNOSIS — Z9889 Other specified postprocedural states: Secondary | ICD-10-CM | POA: Insufficient documentation

## 2018-07-29 DIAGNOSIS — Z8249 Family history of ischemic heart disease and other diseases of the circulatory system: Secondary | ICD-10-CM | POA: Insufficient documentation

## 2018-07-29 DIAGNOSIS — N183 Chronic kidney disease, stage 3 (moderate): Secondary | ICD-10-CM | POA: Diagnosis not present

## 2018-07-29 DIAGNOSIS — Z955 Presence of coronary angioplasty implant and graft: Secondary | ICD-10-CM | POA: Insufficient documentation

## 2018-07-29 DIAGNOSIS — M1990 Unspecified osteoarthritis, unspecified site: Secondary | ICD-10-CM | POA: Insufficient documentation

## 2018-07-29 DIAGNOSIS — I209 Angina pectoris, unspecified: Secondary | ICD-10-CM | POA: Clinically undetermined

## 2018-07-29 DIAGNOSIS — R0609 Other forms of dyspnea: Secondary | ICD-10-CM | POA: Diagnosis not present

## 2018-07-29 DIAGNOSIS — Z7982 Long term (current) use of aspirin: Secondary | ICD-10-CM | POA: Insufficient documentation

## 2018-07-29 DIAGNOSIS — Z87442 Personal history of urinary calculi: Secondary | ICD-10-CM | POA: Insufficient documentation

## 2018-07-29 DIAGNOSIS — E785 Hyperlipidemia, unspecified: Secondary | ICD-10-CM | POA: Diagnosis not present

## 2018-07-29 DIAGNOSIS — E11319 Type 2 diabetes mellitus with unspecified diabetic retinopathy without macular edema: Secondary | ICD-10-CM | POA: Insufficient documentation

## 2018-07-29 DIAGNOSIS — G4730 Sleep apnea, unspecified: Secondary | ICD-10-CM | POA: Insufficient documentation

## 2018-07-29 DIAGNOSIS — I2584 Coronary atherosclerosis due to calcified coronary lesion: Secondary | ICD-10-CM | POA: Insufficient documentation

## 2018-07-29 DIAGNOSIS — I25119 Atherosclerotic heart disease of native coronary artery with unspecified angina pectoris: Secondary | ICD-10-CM

## 2018-07-29 DIAGNOSIS — Z794 Long term (current) use of insulin: Secondary | ICD-10-CM | POA: Diagnosis not present

## 2018-07-29 DIAGNOSIS — Z79899 Other long term (current) drug therapy: Secondary | ICD-10-CM | POA: Insufficient documentation

## 2018-07-29 DIAGNOSIS — E7849 Other hyperlipidemia: Secondary | ICD-10-CM | POA: Diagnosis present

## 2018-07-29 DIAGNOSIS — Z960 Presence of urogenital implants: Secondary | ICD-10-CM | POA: Insufficient documentation

## 2018-07-29 DIAGNOSIS — I441 Atrioventricular block, second degree: Secondary | ICD-10-CM

## 2018-07-29 DIAGNOSIS — I2510 Atherosclerotic heart disease of native coronary artery without angina pectoris: Secondary | ICD-10-CM

## 2018-07-29 DIAGNOSIS — E1122 Type 2 diabetes mellitus with diabetic chronic kidney disease: Secondary | ICD-10-CM | POA: Insufficient documentation

## 2018-07-29 DIAGNOSIS — K219 Gastro-esophageal reflux disease without esophagitis: Secondary | ICD-10-CM | POA: Diagnosis not present

## 2018-07-29 DIAGNOSIS — E1165 Type 2 diabetes mellitus with hyperglycemia: Secondary | ICD-10-CM

## 2018-07-29 DIAGNOSIS — Z9861 Coronary angioplasty status: Secondary | ICD-10-CM

## 2018-07-29 DIAGNOSIS — E119 Type 2 diabetes mellitus without complications: Secondary | ICD-10-CM

## 2018-07-29 DIAGNOSIS — Z6841 Body Mass Index (BMI) 40.0 and over, adult: Secondary | ICD-10-CM | POA: Diagnosis not present

## 2018-07-29 DIAGNOSIS — I10 Essential (primary) hypertension: Secondary | ICD-10-CM | POA: Diagnosis present

## 2018-07-29 DIAGNOSIS — I129 Hypertensive chronic kidney disease with stage 1 through stage 4 chronic kidney disease, or unspecified chronic kidney disease: Secondary | ICD-10-CM | POA: Diagnosis not present

## 2018-07-29 HISTORY — DX: Atherosclerotic heart disease of native coronary artery without angina pectoris: I25.10

## 2018-07-29 HISTORY — DX: Other chronic pain: G89.29

## 2018-07-29 HISTORY — DX: Atrioventricular block, second degree: I44.1

## 2018-07-29 HISTORY — DX: Chronic kidney disease, stage 3 (moderate): N18.3

## 2018-07-29 HISTORY — DX: Personal history of urinary calculi: Z87.442

## 2018-07-29 HISTORY — PX: CORONARY STENT INTERVENTION: CATH118234

## 2018-07-29 HISTORY — DX: Other symptoms and signs involving the nervous system: R29.818

## 2018-07-29 HISTORY — DX: Coronary angioplasty status: Z98.61

## 2018-07-29 HISTORY — DX: Chronic kidney disease, stage 3 unspecified: N18.30

## 2018-07-29 HISTORY — DX: Low back pain: M54.5

## 2018-07-29 HISTORY — PX: CORONARY ANGIOPLASTY WITH STENT PLACEMENT: SHX49

## 2018-07-29 HISTORY — DX: Type 2 diabetes mellitus without complications: E11.9

## 2018-07-29 HISTORY — PX: LEFT HEART CATH AND CORONARY ANGIOGRAPHY: CATH118249

## 2018-07-29 HISTORY — DX: Low back pain, unspecified: M54.50

## 2018-07-29 LAB — POCT ACTIVATED CLOTTING TIME: Activated Clotting Time: 395 seconds

## 2018-07-29 LAB — GLUCOSE, CAPILLARY
GLUCOSE-CAPILLARY: 128 mg/dL — AB (ref 70–99)
GLUCOSE-CAPILLARY: 127 mg/dL — AB (ref 70–99)
Glucose-Capillary: 210 mg/dL — ABNORMAL HIGH (ref 70–99)
Glucose-Capillary: 162 mg/dL — ABNORMAL HIGH (ref 70–99)

## 2018-07-29 SURGERY — LEFT HEART CATH AND CORONARY ANGIOGRAPHY
Anesthesia: LOCAL

## 2018-07-29 MED ORDER — SODIUM CHLORIDE 0.9 % IV SOLN: 250.0000 mL | INTRAVENOUS | Status: DC | PRN

## 2018-07-29 MED ORDER — MIDAZOLAM HCL 2 MG/2ML IJ SOLN
INTRAMUSCULAR | Status: AC
  Filled 2018-07-29: qty 2

## 2018-07-29 MED ORDER — ONDANSETRON HCL 4 MG/2ML IJ SOLN: 4.0000 mg | Freq: Four times a day (QID) | INTRAMUSCULAR | Status: DC | PRN

## 2018-07-29 MED ORDER — PANTOPRAZOLE SODIUM 40 MG PO TBEC
40.0000 mg | DELAYED_RELEASE_TABLET | Freq: Every day | ORAL | Status: DC
  Administered 2018-07-29 – 2018-07-31 (×3): 40 mg via ORAL
  Filled 2018-07-29 (×3): qty 1

## 2018-07-29 MED ORDER — SODIUM CHLORIDE 0.9 % IV SOLN: INTRAVENOUS | Status: AC

## 2018-07-29 MED ORDER — LIDOCAINE HCL (PF) 1 % IJ SOLN
INTRAMUSCULAR | Status: DC | PRN
  Administered 2018-07-29: 2 mL via INTRADERMAL

## 2018-07-29 MED ORDER — ANGIOPLASTY BOOK
Freq: Once | Status: AC
  Administered 2018-07-29: 1
  Filled 2018-07-29: qty 1

## 2018-07-29 MED ORDER — HYDRALAZINE HCL 20 MG/ML IJ SOLN: 5.0000 mg | INTRAMUSCULAR | Status: AC | PRN

## 2018-07-29 MED ORDER — SODIUM CHLORIDE 0.9% FLUSH: 3.0000 mL | INTRAVENOUS | Status: DC | PRN

## 2018-07-29 MED ORDER — SODIUM CHLORIDE 0.9 % WEIGHT BASED INFUSION
1.0000 mL/kg/h | INTRAVENOUS | Status: DC
  Administered 2018-07-29: 1 mL/kg/h via INTRAVENOUS

## 2018-07-29 MED ORDER — HEPARIN SODIUM (PORCINE) 1000 UNIT/ML IJ SOLN
INTRAMUSCULAR | Status: DC | PRN
  Administered 2018-07-29 (×2): 6000 [IU] via INTRAVENOUS

## 2018-07-29 MED ORDER — SODIUM CHLORIDE 0.9% FLUSH: 3.0000 mL | Freq: Two times a day (BID) | INTRAVENOUS | Status: DC

## 2018-07-29 MED ORDER — LISINOPRIL 40 MG PO TABS
40.0000 mg | ORAL_TABLET | Freq: Every day | ORAL | Status: DC
  Administered 2018-07-30: 07:00:00 40 mg via ORAL
  Filled 2018-07-29 (×2): qty 1

## 2018-07-29 MED ORDER — ASPIRIN 81 MG PO CHEW: 81.0000 mg | CHEWABLE_TABLET | ORAL | Status: DC

## 2018-07-29 MED ORDER — ISOSORBIDE MONONITRATE ER 30 MG PO TB24
30.0000 mg | ORAL_TABLET | Freq: Every day | ORAL | Status: DC
  Administered 2018-07-29 – 2018-07-31 (×3): 30 mg via ORAL
  Filled 2018-07-29 (×3): qty 1

## 2018-07-29 MED ORDER — TICAGRELOR 90 MG PO TABS
ORAL_TABLET | ORAL | Status: AC
  Filled 2018-07-29: qty 2

## 2018-07-29 MED ORDER — HEPARIN SODIUM (PORCINE) 1000 UNIT/ML IJ SOLN
INTRAMUSCULAR | Status: AC
  Filled 2018-07-29: qty 1

## 2018-07-29 MED ORDER — ASPIRIN EC 81 MG PO TBEC
81.0000 mg | DELAYED_RELEASE_TABLET | Freq: Every day | ORAL | Status: DC
  Administered 2018-07-30 – 2018-07-31 (×2): 81 mg via ORAL
  Filled 2018-07-29 (×2): qty 1

## 2018-07-29 MED ORDER — METOPROLOL SUCCINATE ER 25 MG PO TB24: 25.0000 mg | ORAL_TABLET | Freq: Every day | ORAL | Status: DC

## 2018-07-29 MED ORDER — FENTANYL CITRATE (PF) 100 MCG/2ML IJ SOLN
INTRAMUSCULAR | Status: AC
  Filled 2018-07-29: qty 2

## 2018-07-29 MED ORDER — VERAPAMIL HCL 2.5 MG/ML IV SOLN
INTRAVENOUS | Status: DC | PRN
  Administered 2018-07-29: 11:00:00 via INTRA_ARTERIAL

## 2018-07-29 MED ORDER — TICAGRELOR 90 MG PO TABS
90.0000 mg | ORAL_TABLET | Freq: Two times a day (BID) | ORAL | Status: DC
  Administered 2018-07-29 – 2018-07-31 (×4): 90 mg via ORAL
  Filled 2018-07-29 (×4): qty 1

## 2018-07-29 MED ORDER — SODIUM CHLORIDE 0.9% FLUSH
3.0000 mL | Freq: Two times a day (BID) | INTRAVENOUS | Status: DC
  Administered 2018-07-29 – 2018-07-31 (×4): 3 mL via INTRAVENOUS

## 2018-07-29 MED ORDER — MIDAZOLAM HCL 2 MG/2ML IJ SOLN
INTRAMUSCULAR | Status: DC | PRN
  Administered 2018-07-29 (×2): 1 mg via INTRAVENOUS

## 2018-07-29 MED ORDER — TICAGRELOR 90 MG PO TABS
ORAL_TABLET | ORAL | Status: DC | PRN
  Administered 2018-07-29: 180 mg via ORAL

## 2018-07-29 MED ORDER — LIDOCAINE HCL (PF) 1 % IJ SOLN
INTRAMUSCULAR | Status: AC
  Filled 2018-07-29: qty 30

## 2018-07-29 MED ORDER — LABETALOL HCL 5 MG/ML IV SOLN: 10.0000 mg | INTRAVENOUS | Status: AC | PRN

## 2018-07-29 MED ORDER — ACETAMINOPHEN 325 MG PO TABS: 650.0000 mg | ORAL_TABLET | ORAL | Status: DC | PRN

## 2018-07-29 MED ORDER — HEPARIN (PORCINE) IN NACL 1000-0.9 UT/500ML-% IV SOLN
INTRAVENOUS | Status: AC
  Filled 2018-07-29: qty 1000

## 2018-07-29 MED ORDER — GLIPIZIDE 10 MG PO TABS
10.0000 mg | ORAL_TABLET | Freq: Every day | ORAL | Status: DC
  Administered 2018-07-30 – 2018-07-31 (×2): 10 mg via ORAL
  Filled 2018-07-29: qty 2
  Filled 2018-07-29 (×2): qty 1

## 2018-07-29 MED ORDER — FENTANYL CITRATE (PF) 100 MCG/2ML IJ SOLN
INTRAMUSCULAR | Status: DC | PRN
  Administered 2018-07-29 (×2): 25 ug via INTRAVENOUS

## 2018-07-29 MED ORDER — NITROGLYCERIN 1 MG/10 ML FOR IR/CATH LAB
INTRA_ARTERIAL | Status: AC
  Filled 2018-07-29: qty 10

## 2018-07-29 MED ORDER — IOHEXOL 350 MG/ML SOLN
INTRAVENOUS | Status: DC | PRN
  Administered 2018-07-29: 155 mL via INTRA_ARTERIAL

## 2018-07-29 MED ORDER — HEPARIN (PORCINE) IN NACL 1000-0.9 UT/500ML-% IV SOLN
INTRAVENOUS | Status: DC | PRN
  Administered 2018-07-29 (×2): 500 mL

## 2018-07-29 MED ORDER — VERAPAMIL HCL 2.5 MG/ML IV SOLN
INTRAVENOUS | Status: AC
  Filled 2018-07-29: qty 2

## 2018-07-29 MED ORDER — ATORVASTATIN CALCIUM 80 MG PO TABS
80.0000 mg | ORAL_TABLET | Freq: Every day | ORAL | Status: DC
  Administered 2018-07-29 – 2018-07-30 (×2): 80 mg via ORAL
  Filled 2018-07-29 (×2): qty 1

## 2018-07-29 MED ORDER — NITROGLYCERIN 1 MG/10 ML FOR IR/CATH LAB
INTRA_ARTERIAL | Status: DC | PRN
  Administered 2018-07-29: 200 ug via INTRACORONARY

## 2018-07-29 MED ORDER — INSULIN ASPART 100 UNIT/ML ~~LOC~~ SOLN
0.0000 [IU] | Freq: Three times a day (TID) | SUBCUTANEOUS | Status: DC
  Administered 2018-07-29: 14:00:00 3 [IU] via SUBCUTANEOUS
  Administered 2018-07-29 – 2018-07-30 (×2): 7 [IU] via SUBCUTANEOUS
  Administered 2018-07-30 (×2): 4 [IU] via SUBCUTANEOUS
  Administered 2018-07-31: 7 [IU] via SUBCUTANEOUS

## 2018-07-29 MED ORDER — SODIUM CHLORIDE 0.9 % WEIGHT BASED INFUSION
3.0000 mL/kg/h | INTRAVENOUS | Status: DC
  Administered 2018-07-29: 3 mL/kg/h via INTRAVENOUS

## 2018-07-29 SURGICAL SUPPLY — 21 items
BALLN EMERGE MR 2.5X12 (BALLOONS) ×2
BALLN SAPPHIRE ~~LOC~~ 3.75X8 (BALLOONS) ×1 IMPLANT
BALLN WOLVERINE 3.00X10 (BALLOONS) ×2
BALLN ~~LOC~~ EMERGE MR 4.0X8 (BALLOONS) ×2
BALLOON EMERGE MR 2.5X12 (BALLOONS) IMPLANT
BALLOON WOLVERINE 3.00X10 (BALLOONS) IMPLANT
BALLOON ~~LOC~~ EMERGE MR 4.0X8 (BALLOONS) IMPLANT
CATH OPTITORQUE TIG 4.0 5F (CATHETERS) ×1 IMPLANT
CATH VISTA GUIDE 6FR XBLAD3.5 (CATHETERS) ×1 IMPLANT
DEVICE RAD COMP TR BAND LRG (VASCULAR PRODUCTS) ×1 IMPLANT
GLIDESHEATH SLEND A-KIT 6F 22G (SHEATH) ×1 IMPLANT
GLIDESHEATH SLEND SS 6F .021 (SHEATH) IMPLANT
GUIDEWIRE INQWIRE 1.5J.035X260 (WIRE) IMPLANT
INQWIRE 1.5J .035X260CM (WIRE) ×2
KIT ENCORE 26 ADVANTAGE (KITS) ×1 IMPLANT
KIT HEART LEFT (KITS) ×2 IMPLANT
PACK CARDIAC CATHETERIZATION (CUSTOM PROCEDURE TRAY) ×2 IMPLANT
STENT ORSIRO 3.5X13 (Permanent Stent) ×1 IMPLANT
TRANSDUCER W/STOPCOCK (MISCELLANEOUS) ×2 IMPLANT
TUBING CIL FLEX 10 FLL-RA (TUBING) ×2 IMPLANT
WIRE ASAHI PROWATER 180CM (WIRE) ×1 IMPLANT

## 2018-07-29 NOTE — Progress Notes (Signed)
TR BAND REMOVAL  LOCATION:    right radial  DEFLATED PER PROTOCOL:    Yes.    TIME BAND OFF / DRESSING APPLIED:    1530   SITE UPON ARRIVAL:    Level 0  SITE AFTER BAND REMOVAL:    Level 0  CIRCULATION SENSATION AND MOVEMENT:    Within Normal Limits   Yes.    COMMENTS:   Tolerated procedure well 

## 2018-07-29 NOTE — Interval H&P Note (Signed)
History and Physical Interval Note:  07/29/2018 10:42 AM  Erik Hancock  has presented today for surgery, with the diagnosis of exertional angina (Class II)  The various methods of treatment have been discussed with the patient and family. After consideration of risks, benefits and other options for treatment, the patient has consented to  Procedure(s): LEFT HEART CATH AND CORONARY ANGIOGRAPHY (N/A) with possible PERCUTANEOUS CORONARY INTERVENTION as a surgical intervention .  The patient's history has been reviewed, patient examined, no change in status, stable for surgery.  I have reviewed the patient's chart and labs.  Questions were answered to the patient's satisfaction.    Cath Lab Visit (complete for each Cath Lab visit)  Clinical Evaluation Leading to the Procedure:   ACS: No.  Non-ACS:    Anginal Classification: CCS II  Anti-ischemic medical therapy: Maximal Therapy (2 or more classes of medications)  Non-Invasive Test Results: No non-invasive testing performed  Prior CABG: No previous CABG   Bryan Lemma

## 2018-07-29 NOTE — Care Management (Signed)
07-29-18  BENEFITS  CHECK :   # 5.  S/W   CHRIS   @ [facility]  # [id number]  BRILINTA   90 MG  BID COVER- YES CO-PAY- ZERO DOLLARS TIER- NO PRIOR APPROVAL- NO  PREFERRED PHARMACY : YES    [facility]

## 2018-07-29 NOTE — Progress Notes (Signed)
   Notified by RN that patient was having pauses on telemetry. On review, there was evidence of 2nd degree AV block, suspicious for type 2. He is without complaints. Will hold his metoprolol at this time. Dr. Elberta Fortis to review his telemetry.   Will continue to monitor patient.  Beatriz Stallion, PA-C

## 2018-07-30 ENCOUNTER — Encounter (HOSPITAL_COMMUNITY): Payer: Self-pay | Admitting: Physician Assistant

## 2018-07-30 DIAGNOSIS — I10 Essential (primary) hypertension: Secondary | ICD-10-CM

## 2018-07-30 DIAGNOSIS — I441 Atrioventricular block, second degree: Secondary | ICD-10-CM

## 2018-07-30 DIAGNOSIS — I495 Sick sinus syndrome: Secondary | ICD-10-CM | POA: Diagnosis not present

## 2018-07-30 DIAGNOSIS — I2584 Coronary atherosclerosis due to calcified coronary lesion: Secondary | ICD-10-CM | POA: Diagnosis not present

## 2018-07-30 DIAGNOSIS — N183 Chronic kidney disease, stage 3 (moderate): Secondary | ICD-10-CM

## 2018-07-30 DIAGNOSIS — I25119 Atherosclerotic heart disease of native coronary artery with unspecified angina pectoris: Secondary | ICD-10-CM | POA: Diagnosis not present

## 2018-07-30 DIAGNOSIS — R0609 Other forms of dyspnea: Secondary | ICD-10-CM | POA: Diagnosis not present

## 2018-07-30 DIAGNOSIS — Z955 Presence of coronary angioplasty implant and graft: Secondary | ICD-10-CM | POA: Diagnosis not present

## 2018-07-30 DIAGNOSIS — Z9861 Coronary angioplasty status: Secondary | ICD-10-CM

## 2018-07-30 LAB — GLUCOSE, CAPILLARY
GLUCOSE-CAPILLARY: 155 mg/dL — AB (ref 70–99)
Glucose-Capillary: 153 mg/dL — ABNORMAL HIGH (ref 70–99)
Glucose-Capillary: 228 mg/dL — ABNORMAL HIGH (ref 70–99)
Glucose-Capillary: 217 mg/dL — ABNORMAL HIGH (ref 70–99)

## 2018-07-30 LAB — CBC
HCT: 43.4 % (ref 39.0–52.0)
HEMOGLOBIN: 13.7 g/dL (ref 13.0–17.0)
MCH: 28.3 pg (ref 26.0–34.0)
MCHC: 31.6 g/dL (ref 30.0–36.0)
MCV: 89.7 fL (ref 78.0–100.0)
PLATELETS: 276 10*3/uL (ref 150–400)
RBC: 4.84 MIL/uL (ref 4.22–5.81)
RDW: 14.2 % (ref 11.5–15.5)
WBC: 10.4 10*3/uL (ref 4.0–10.5)

## 2018-07-30 LAB — BASIC METABOLIC PANEL
ANION GAP: 9 (ref 5–15)
BUN: 20 mg/dL (ref 8–23)
CALCIUM: 9.3 mg/dL (ref 8.9–10.3)
CO2: 22 mmol/L (ref 22–32)
Chloride: 108 mmol/L (ref 98–111)
Creatinine, Ser: 1.45 mg/dL — ABNORMAL HIGH (ref 0.61–1.24)
GFR calc non Af Amer: 49 mL/min — ABNORMAL LOW (ref >60)
GFR, EST AFRICAN AMERICAN: 56 mL/min — AB (ref >60)
Glucose, Bld: 185 mg/dL — ABNORMAL HIGH (ref 70–99)
Potassium: 4.5 mmol/L (ref 3.5–5.1)
Sodium: 139 mmol/L (ref 135–145)

## 2018-07-30 LAB — TSH: TSH: 1.867 u[IU]/mL (ref 0.350–4.500)

## 2018-07-30 MED ORDER — AMLODIPINE BESYLATE 5 MG PO TABS
5.0000 mg | ORAL_TABLET | Freq: Every day | ORAL | Status: DC
  Administered 2018-07-31: 5 mg via ORAL
  Filled 2018-07-30: qty 1

## 2018-07-30 MED ORDER — LISINOPRIL 20 MG PO TABS
20.0000 mg | ORAL_TABLET | Freq: Every day | ORAL | Status: DC
  Administered 2018-07-31: 20 mg via ORAL
  Filled 2018-07-30: qty 1

## 2018-07-30 NOTE — Progress Notes (Signed)
Pt brady down to 31, patient was asymptomatic, was sleeping. Strip in chart

## 2018-07-30 NOTE — Progress Notes (Signed)
Pt was transferred from 6 c. Pt brought to the floor in stable condition. Vitals taken. Initial Assessment done. All immediate pertinent needs to patient addressed. Patient Guide given to patient. Important safety instructions relating to hospitalization reviewed with patient. Patient verbalized understanding. Will continue to monitor pt.  Lonia Farber, RN

## 2018-07-30 NOTE — Progress Notes (Signed)
CARDIAC REHAB PHASE I   PRE:  Rate/Rhythm: 76 SR   Pt sitting on edge of bed with wife at beside. Pt just finished walking prior to arriving at room. Reviewed education with pt and wife. Reviewed stent card, heart healthy/diabetic nutrition, exercise guidelines, angina/NTG use, restrictions. Pt and wife receptive to education. Spoke with pt regarding Cardiac Rehab Phase II. Request sent to [facility] per pt's request.   [phone]   York Cerise MS, ACSM CEP  9:01 AM 07/30/2018

## 2018-07-30 NOTE — Progress Notes (Addendum)
Progress Note  Patient Name: Erik Hancock Date of Encounter: 07/30/2018  Primary Cardiologist: Chilton Si, MD  Subjective   Feeling great. Has ambulated in hallway without SOB. Feels like he has more energy. However, tele yesterday showed intermittent episodes of 2nd degree type 2 AV block, asymptomatic - it happened at 12:46pm, 3:54pm, 7:50pm - with ventricular standstill (several P's without conduction). He also had several episodes overnight as well, with HR dipping to 30s. Pt and wife believe last dose of metoprolol was yesterday AM prior to cath. He was asymptomatic. Denies prior pre syncope or syncope.  Inpatient Medications    Scheduled Meds: . aspirin EC  81 mg Oral Daily  . atorvastatin  80 mg Oral q1800  . glipiZIDE  10 mg Oral Q breakfast  . insulin aspart  0-20 Units Subcutaneous TID WC  . isosorbide mononitrate  30 mg Oral Daily  . lisinopril  40 mg Oral Daily  . pantoprazole  40 mg Oral Daily  . sodium chloride flush  3 mL Intravenous Q12H  . ticagrelor  90 mg Oral BID   Continuous Infusions: . sodium chloride     PRN Meds: sodium chloride, acetaminophen, ondansetron (ZOFRAN) IV, sodium chloride flush   Vital Signs    Vitals:   07/29/18 1954 07/29/18 2000 07/30/18 0242 07/30/18 0700  BP: (!) 158/59  (!) 146/69 (!) 168/68  Pulse: 73 70 64   Resp: 17 19 19    Temp: 99.3 F (37.4 C)  98.4 F (36.9 C) (!) 97.3 F (36.3 C)  TempSrc: Oral  Oral Oral  SpO2: 96% 97% 98%   Weight:   131.3 kg   Height:        Intake/Output Summary (Last 24 hours) at 07/30/2018 0905 Last data filed at 07/30/2018 0400 Gross per 24 hour  Intake 1046.98 ml  Output 2650 ml  Net -1603.02 ml   Filed Weights   07/29/18 0546 07/30/18 0242  Weight: 131.5 kg 131.3 kg    Telemetry    NSR with intermittent 2nd degree AV block with brief episodes of ventricular standstill overnight - Personally Reviewed  ECG    NSR 1st degree AVB NSSTs - Personally Reviewed  Physical Exam    GEN: No acute distress.  HEENT: Normocephalic, atraumatic, sclera non-icteric. Neck: No JVD or bruits. Cardiac: RRR no murmurs, rubs, or gallops.  Radials/DP/PT 1+ and equal bilaterally.  Respiratory: Clear to auscultation bilaterally. Breathing is unlabored. GI: Soft, nontender, non-distended, BS +x 4. MS: no deformity. Extremities: No clubbing or cyanosis. No edema. Distal pedal pulses are 2+ and equal bilaterally. Right radial cath site without hematoma or ecchymosis; good pulse. Neuro:  AAOx3. Follows commands. Psych:  Responds to questions appropriately with a normal affect.  Labs    Chemistry Recent Labs  Lab 07/26/18 1607 07/30/18 0413  NA 141 139  K 5.0 4.5  CL 105 108  CO2 20 22  GLUCOSE 124* 185*  BUN 24 20  CREATININE 1.57* 1.45*  CALCIUM 9.9 9.3  GFRNONAA 45* 49*  GFRAA 52* 56*  ANIONGAP  --  9     Hematology Recent Labs  Lab 07/26/18 1607 07/30/18 0413  WBC 8.2 10.4  RBC 4.95 4.84  HGB 14.1 13.7  HCT 42.2 43.4  MCV 85 89.7  MCH 28.5 28.3  MCHC 33.4 31.6  RDW 14.0 14.2  PLT 325 276      Radiology    No results found.  Cardiac Studies   1. Cardiac catheterization this admission,  please see full report and below for summary.  Patient Profile     67 y.o. male with CAD (PCI of LAD 2003, PDA/LCx in 2007), DM, CKD III by labs. HTN, HLD, arthritis, GERD, morbid obesity was seen in the office 07/26/18 with symptoms concerning for unstable angina. Pre-cath labs showed continued renal insufficiency so it was suggested to hold lisinopril 2 days prior to cath then reduce to 20mg  daily. He was admitted yesterday for planned procedure.  Assessment & Plan    1. Unstable angina/CAD - 85% ostial LAD just prior to previous stent, treated with DES overlapping previous stent. Otherwise nonobstructive disease noted. EF 55-65%, LVEDP normal. Per Dr. Herbie Baltimore, "Recommend dual antiplatelet therapy with Aspirin 81mg  daily and Ticagrelor 90mg  twice daily long-term  (beyond 12 months) because of Ostial LAD stent.  Would be okay to stop aspirin after 3 to 6 months, but would continue Brilinta at 90 mg for 1 year and then reduced to 60 mg/convert to Plavix 75 mg after 1 year." Ultimate decision making will be up to Dr. Duke Salvia about duration. Continue atorvastatin (recent LDL 59). Hold BB as below.   2. Intermittent type 2 second degree AV block - several episodes of sequential blocked QRS complexes overnight while sleeping. Initial episode happened during daytime hours yesterday. He and wife believe last dose of metoprolol was yesterday AM. Will check TSH. Will plan to review with Dr. Duke Salvia. Addendum: I did preliminarily review with Dr. Ladona Ridgel who suggested observation off his metoprolol today, and if still having pauses through the day until tomorrow, would suggest pacing on Monday. I did not yet discuss with the patient.  3. CKD stage III - I will clarify with MD about plan for lisinopril. Her result note indicated recommendation to reduce to 20mg  daily but reordered as 40mg  as inpatient. He held this prior to cath.  4. HTN - BP elevated this AM, will follow. May need adjustment now that BB has been stopped.  5. Morbid obesity/suspected OSA - pending OP sleep study. Discussed role of weight in management.  For questions or updates, please contact [facility] Please consult [email or web address] for contact info under Cardiology/STEMI.  Signed, Laurann Montana, PA-C 07/30/2018, 9:05 AM

## 2018-07-31 ENCOUNTER — Encounter (HOSPITAL_COMMUNITY): Payer: Self-pay | Admitting: Physician Assistant

## 2018-07-31 ENCOUNTER — Other Ambulatory Visit: Payer: Self-pay | Admitting: Physician Assistant

## 2018-07-31 DIAGNOSIS — N183 Chronic kidney disease, stage 3 unspecified: Secondary | ICD-10-CM

## 2018-07-31 DIAGNOSIS — Z9861 Coronary angioplasty status: Secondary | ICD-10-CM | POA: Diagnosis not present

## 2018-07-31 DIAGNOSIS — I10 Essential (primary) hypertension: Secondary | ICD-10-CM | POA: Diagnosis not present

## 2018-07-31 DIAGNOSIS — I25119 Atherosclerotic heart disease of native coronary artery with unspecified angina pectoris: Secondary | ICD-10-CM | POA: Diagnosis not present

## 2018-07-31 LAB — GLUCOSE, CAPILLARY: GLUCOSE-CAPILLARY: 207 mg/dL — AB (ref 70–99)

## 2018-07-31 MED ORDER — NITROGLYCERIN 0.4 MG SL SUBL: 0.4000 mg | SUBLINGUAL_TABLET | SUBLINGUAL | 3 refills | Status: DC | PRN

## 2018-07-31 MED ORDER — TICAGRELOR 90 MG PO TABS: 90.0000 mg | ORAL_TABLET | Freq: Two times a day (BID) | ORAL | 11 refills | Status: DC

## 2018-07-31 MED ORDER — LISINOPRIL 20 MG PO TABS: 20.0000 mg | ORAL_TABLET | Freq: Every day | ORAL | 6 refills | Status: DC

## 2018-07-31 MED ORDER — AMLODIPINE BESYLATE 5 MG PO TABS: 5.0000 mg | ORAL_TABLET | Freq: Every day | ORAL | 6 refills | Status: DC

## 2018-07-31 NOTE — Discharge Summary (Addendum)
Discharge Summary    Patient ID: Erik Hancock MRN: [id number]; DOB: 20-Feb-1951  Admit date: 07/29/2018 Discharge date: 07/31/2018  Primary Care Provider: Donita Brooks, MD  Primary Cardiologist: Chilton Si, MD  Discharge Diagnoses    Principal Problem:   Angina, class II [facility]) Active Problems:   Diabetes mellitus (HCC)   Essential hypertension   Hyperlipidemia due to dietary fat intake   CAD S/P percutaneous coronary angioplasty   Morbid obesity (HCC)   Exertional dyspnea   Mobitz type 2 second degree atrioventricular block   CKD (chronic kidney disease), stage III (HCC)   Allergies Allergies  Allergen Reactions  . Beta Adrenergic Blockers     Metoprolol stopped 07/2018 due to 2nd degree type 2 AV block.    Diagnostic Studies/Procedures    Cath 07/29/18 Conclusion     Ost LAD lesion is 85% stenosed -just prior to previous stent. Previously placed prox LAD DES stent is 5% stenosed.  Following scoring balloon angioplasty, A drug-eluting stent was successfully placed overlapping the previous stent proximally, using a STENT ORSIRO 3.5X13 --postdilated to 4.0 mm  Post intervention, there is a 0% residual stenosis.  Previously placed Prox Cx to Dist Cx stent (DES) is widely patent. Ost 2nd Mrg lesion is 40% stenosed.  Ost Cx to Prox Cx lesion is 30% stenosed.  Previously placed RPDA-1 stent (DES) is widely patent. RPDA-2 lesion is 40% stenosed just beyond stent.  The left ventricular systolic function is normal. The left ventricular ejection fraction is 55-65% by visual estimate. LV end diastolic pressure is normal.  Post intervention, there is a 5% residual stenosis.  A drug-eluting stent was successfully placed using a STENT ORSIRO 3.5X13.   Three-vessel CAD with stents in the LAD, circumflex and PDA. Culprit lesion is severe 95% proximal edge lesion and ostial LAD -> successfully treated with scoring balloon angioplasty and DES stent overlapping the  original stent (STENT ORSIRO 3.5X13 -postdilated to 4.0 mm) Patent stents in circumflex and RPDA Normal LV function with normal LVEDP.   As the patient has an ostial LAD stent, I felt it more prudent to monitor the patient overnight as opposed to same-day discharge.  He will be transferred to postprocedure unit for ongoing care.  Continue home medications with exception of his diabetes medications (will cover with sliding scale insulin)  Recommend dual antiplatelet therapy with Aspirin 81mg  daily and Ticagrelor 90mg  twice daily long-term (beyond 12 months) because of Ostial LAD stent.  Would be okay to stop aspirin after 3 to 6 months, but would continue Brilinta at 90 mg for 1 year and then reduced to 60 mg/convert to Plavix 75 mg after 1 year.Erik Hancock   He will follow-up with Dr. Chilton Si.   Bryan Lemma, M.D., M.S. Interventional Cardiologist       _____________   History of Present Illness     67 y.o. male with CAD (PCI of LAD 2003, PDA/LCx in 2007), DM, CKD III by labs. HTN, HLD, arthritis, GERD, morbid obesity was recently seen in the office to establish care on 07/26/18 with symptoms concerning for unstable angina. Pre-cath labs showed continued renal insufficiency so it was suggested to hold lisinopril 2 days prior to cath. He was admitted 07/29/2018 for planned procedure.  Hospital Course     1. Unstable angina/CAD - cath showed 85% ostial LAD just prior to previous stent, treated with DES overlapping previous stent. Otherwise nonobstructive disease noted. EF 55-65%, LVEDP normal. Per Dr. Herbie Baltimore, "Recommend dual antiplatelet therapy with Aspirin  81mg  daily and Ticagrelor 90mg  twice dailylong-term (beyond 12 months) because of Ostial LAD stent. Would be okay to stop aspirin after 3 to 6 months, but would continue Brilinta at 90 mg for 1 year and then reduced to 60 mg/convert to Plavix 75 mg after 1 year." Ultimate decision making will be up to Dr. Duke Salvia about duration.  Atorvastatin was continued (recent LDL 59). Metoprolol was stopped as below  2. Intermittent type 2 second degree AV block - this was initially noted the afternoon after cath. He was noted to have several episodes of Mobitz 2nd degree AV block type 2, with dropping up to 2 beats at a time. This was also noted while sleeping. He had taken his metoprolol at home prior to cath, but it was held once this was recognized. TSH was normal. He was totally asymptomatic. He wished to go home day after cath but it was recommended he stay for continued observation for one more night. After d/c of metoprolol, he only had episode overnight while sleeping. Dr. Ladona Ridgel reviewed strips and case and felt no specific intervention was needed at this time and recommended watchful waiting for any symptoms to correlate. He did not feel event monitoring was necessary. Patient was advised to avoid HR-slowing medicines and call if any new symptoms. He denied h/o presyncope or syncope.  3. CKD stage III - Previously 1.24 in 11/2017. It was 1.57 prior to cath, and 1.45 post-cath. Dr. Duke Salvia recommended to decrease lisinopril to 20mg  daily and add amlodipine 5mg  daily to accommodate BP control since BB was also stopped. He was advised to d/c NSAIDS. Lab order was entered for him to return Tuesday 9/10 to lab for BMET.  4. HTN - changes made as above. Likely also driven by #5.  5. Morbid obesity/suspected OSA - pending OP sleep study. Discussed role of weight in management.  6. DM - he was advised that he can resume Metformin tomorrow.  The patient feels well this morning. Dr. Duke Salvia has seen and examined the patient today and feels he is stable for discharge. He has a f/u appointment in 2 months but also needs a post cath check within 2 weeks. I have sent a message to our office's scheduling team requesting a follow-up appointment, and our office will call the patient with this information. His usual [facility] is closed  today but he will need to pick up rx's for Brilinta and SL NTG today - per d/w pt/wife, will route Brilinta/NTG to [facility]. He will get amlodipine/lisinopril prior to DC today so we will route these to [facility]. Also received handwritten 30day free Brilinta rx.  _____________    Vital Signs. BP 140/70 (BP Location: Right Arm)   Pulse 66   Temp 98.3 F (36.8 C) (Oral)   Resp 16   Ht 5\' 9"  (1.753 m)   Wt 128.3 kg   SpO2 98%   BMI 41.78 kg/m  General: Well developed, well nourished obese WM, in no acute distress. Head: Normocephalic, atraumatic, sclera non-icteric, no xanthomas, nares are without discharge. Neck: Negative for carotid bruits. JVP not elevated. Lungs: Clear bilaterally to auscultation without wheezes, rales, or rhonchi. Breathing is unlabored. Heart: RRR S1 S2 without murmurs, rubs, or gallops.  Abdomen: Soft, non-tender, non-distended with normoactive bowel sounds. No rebound/guarding. Extremities: No clubbing or cyanosis. No edema. Distal pedal pulses are 2+ and equal bilaterally. Right radial cath site without hematoma or ecchymosis; good pulse. Neuro: Alert and oriented X 3. Moves all  extremities spontaneously. Psych:  Responds to questions appropriately with a normal affect.   Labs & Radiologic Studies    CBC Recent Labs    07/30/18 0413  WBC 10.4  HGB 13.7  HCT 43.4  MCV 89.7  PLT 276   Basic Metabolic Panel Recent Labs    30/86/57 0413  NA 139  K 4.5  CL 108  CO2 22  GLUCOSE 185*  BUN 20  CREATININE 1.45*  CALCIUM 9.3  Thyroid Function Tests Recent Labs    07/30/18 0935  TSH 1.867   _____________  No results found. Disposition   Pt is being discharged home today in good condition.  Follow-up Plans & Appointments    Follow-up Information    [facility] Follow up.   Specialty:  Cardiology Why:  Please come to the office on Tuesday 08/02/18 to repeat bloodwork (BMET) to make sure kidney function is stable. Our office will  also call you for a follow-up appointment. Please call the office if you have not heard from [place] within 3 days. Contact information: [address] [place] [address] [phone]         Discharge Instructions    Amb Referral to Cardiac Rehabilitation   Complete by:  As directed    Diagnosis:  Coronary Stents   Amb Referral to Cardiac Rehabilitation   Complete by:  As directed    Diagnosis:  Coronary Stents   Diet - low sodium heart healthy   Complete by:  As directed    Discharge instructions   Complete by:  As directed    IMPORTANT: You can restart your Metformin tomorrow morning 08/01/18. Do not take any today.  Your lisinopril dose was cut to 20mg  daily due to kidney dysfunction.   Your metoprolol was stopped because of intermittent low heart rate. In the future you should avoid all medicines that can cause a slow heart rate. Make sure to mention this to any provider giving you a new medicine.  You were started on amlodipine to help with blood pressure, since the above medicines were changed.  Other new medicines include Brilinta and as-needed nitroglycerin.  Patients with chronic kidney disease should generally stay away from medicines like ibuprofen, Advil, Motrin, naproxen, and Aleve due to risk of worsening kidney function. You may take Tylenol as directed or talk to your primary doctor about alternatives.   Increase activity slowly   Complete by:  As directed    No driving for 2 days. No lifting over 5 lbs for 1 week. No sexual activity for 1 week. Keep procedure site clean & dry. If you notice increased pain, swelling, bleeding or pus, call/return!  You may shower, but no soaking baths/hot tubs/pools for 1 week.      Discharge Medications   Allergies as of 07/31/2018      Reactions   Beta Adrenergic Blockers    Metoprolol stopped 07/2018 due to 2nd degree type 2 AV block.      Medication List    STOP taking these medications   ibuprofen  200 MG tablet Commonly known as:  ADVIL,MOTRIN   metoprolol succinate 25 MG 24 hr tablet Commonly known as:  TOPROL-XL     TAKE these medications   ACCU-CHEK COMPACT PLUS test strip Generic drug:  glucose blood USE TO CHECK THREE TIMES A DAY   glucose blood test strip Check bs bid - tid   amLODipine 5 MG tablet Commonly known as:  NORVASC Take 1  tablet (5 mg total) by mouth daily. Start taking on:  08/01/2018   aspirin 81 MG tablet Take 81 mg by mouth daily.   atorvastatin 80 MG tablet Commonly known as:  LIPITOR Take 1 tablet (80 mg total) by mouth daily.   glipiZIDE 10 MG tablet Commonly known as:  GLUCOTROL TAKE 1 TABLET BY MOUTH ONCE DAILY   Insulin Pen Needle 31G X 8 MM Misc Use daily with Victoza   isosorbide mononitrate 30 MG 24 hr tablet Commonly known as:  IMDUR Take 1 tablet (30 mg total) by mouth daily.   JARDIANCE 25 MG Tabs tablet Generic drug:  empagliflozin TAKE 1 TABLET BY MOUTH ONCE DAILY   LANTUS SOLOSTAR 100 UNIT/ML Solostar Pen Generic drug:  Insulin Glargine INJECT 32 UNITS UNDER THE SKIN EVERY MORNING What changed:  See the new instructions.   liraglutide 18 MG/3ML Sopn Commonly known as:  VICTOZA Inject 0.2 mLs (1.2 mg total) into the skin daily. What changed:  when to take this   lisinopril 20 MG tablet Commonly known as:  PRINIVIL,ZESTRIL Take 1 tablet (20 mg total) by mouth daily. What changed:    medication strength  how much to take   metFORMIN 1000 MG tablet Commonly known as:  GLUCOPHAGE TAKE 1 TABLET BY MOUTH TWICE DAILY   nitroGLYCERIN 0.4 MG SL tablet Commonly known as:  NITROSTAT Place 1 tablet (0.4 mg total) under the tongue every 5 (five) minutes as needed for chest pain (up to 3 doses. If taking 3rd dose call 911).   pantoprazole 40 MG tablet Commonly known as:  PROTONIX TAKE 1 TABLET BY MOUTH ONCE DAILY   ticagrelor 90 MG Tabs tablet Commonly known as:  BRILINTA Take 1 tablet (90 mg total) by mouth 2 (two)  times daily.         Outstanding Labs/Studies   BMET 9/10  Duration of Discharge Encounter   Greater than 30 minutes including physician time.  Signed, Laurann Montana, PA-C 07/31/2018, 9:50 AM

## 2018-07-31 NOTE — Progress Notes (Addendum)
I spoke with Dr. Ladona Ridgel about this patient's intermittent heart block and he will review the pt this AM.  Ronie Spies PA-C

## 2018-07-31 NOTE — Progress Notes (Signed)
Pt got discharged to home, appreciate the help of Michele Rockers, RN to provide the discharge instructions, IV taken out,Telemonitor DC,pt left unit in wheelchair with all of the belongings accompanied with a family member (wife)  Lonia Farber, Charity fundraiser

## 2018-07-31 NOTE — Progress Notes (Signed)
bmet  

## 2018-07-31 NOTE — Progress Notes (Signed)
Pt had an episode of a few second degree type 2 bundle branch block. Pt was asymptomatic and vitals were completed, all within normal limits. MD on call was notified, and will continue to monitor.

## 2018-08-01 ENCOUNTER — Telehealth: Payer: Self-pay | Admitting: Cardiovascular Disease

## 2018-08-01 NOTE — Telephone Encounter (Signed)
Scheduled follow up for patient  

## 2018-08-01 NOTE — Telephone Encounter (Signed)
New message ° ° °Patient's wife is returning your call. °

## 2018-08-01 NOTE — Telephone Encounter (Signed)
Called patient, but, he was resting.  Wife will tell him to call Dr. Leonides Sake office.

## 2018-08-02 ENCOUNTER — Other Ambulatory Visit: Payer: Self-pay

## 2018-08-02 DIAGNOSIS — N183 Chronic kidney disease, stage 3 unspecified: Secondary | ICD-10-CM

## 2018-08-03 LAB — BASIC METABOLIC PANEL
BUN / CREAT RATIO: 11 (ref 10–24)
BUN: 13 mg/dL (ref 8–27)
CO2: 19 mmol/L — ABNORMAL LOW (ref 20–29)
Calcium: 9.5 mg/dL (ref 8.6–10.2)
Chloride: 102 mmol/L (ref 96–106)
Creatinine, Ser: 1.19 mg/dL (ref 0.76–1.27)
GFR, EST AFRICAN AMERICAN: 73 mL/min/{1.73_m2} (ref >59)
GFR, EST NON AFRICAN AMERICAN: 63 mL/min/{1.73_m2} (ref >59)
GLUCOSE: 135 mg/dL — AB (ref 65–99)
Potassium: 4.3 mmol/L (ref 3.5–5.2)
SODIUM: 140 mmol/L (ref 134–144)

## 2018-08-04 ENCOUNTER — Other Ambulatory Visit: Payer: Self-pay | Admitting: Family Medicine

## 2018-08-04 MED ORDER — ISOSORBIDE MONONITRATE ER 30 MG PO TB24: 30.0000 mg | ORAL_TABLET | Freq: Every day | ORAL | 1 refills | Status: DC

## 2018-08-04 NOTE — Progress Notes (Signed)
Pt has been made aware of normal result and verbalized understanding.  jw 08/04/18

## 2018-08-09 ENCOUNTER — Encounter: Payer: Self-pay | Admitting: Family Medicine

## 2018-08-09 ENCOUNTER — Ambulatory Visit: Payer: Medicare Other | Admitting: Family Medicine

## 2018-08-09 VITALS — BP 110/60 | HR 72 | Temp 97.8°F | Resp 16 | Ht 69.5 in | Wt 285.0 lb

## 2018-08-09 DIAGNOSIS — R0602 Shortness of breath: Secondary | ICD-10-CM

## 2018-08-09 DIAGNOSIS — Z09 Encounter for follow-up examination after completed treatment for conditions other than malignant neoplasm: Secondary | ICD-10-CM | POA: Diagnosis not present

## 2018-08-09 LAB — COMPLETE METABOLIC PANEL WITH GFR
AG RATIO: 1.8 (calc) (ref 1.0–2.5)
ALKALINE PHOSPHATASE (APISO): 76 U/L (ref 40–115)
ALT: 35 U/L (ref 9–46)
AST: 25 U/L (ref 10–35)
Albumin: 4.6 g/dL (ref 3.6–5.1)
BUN: 17 mg/dL (ref 7–25)
CALCIUM: 10 mg/dL (ref 8.6–10.3)
CHLORIDE: 105 mmol/L (ref 98–110)
CO2: 20 mmol/L (ref 20–32)
Creat: 1.21 mg/dL (ref 0.70–1.25)
GFR, EST NON AFRICAN AMERICAN: 62 mL/min/{1.73_m2} (ref >=60)
GFR, Est African American: 72 mL/min/{1.73_m2} (ref >=60)
GLOBULIN: 2.5 g/dL (ref 1.9–3.7)
Glucose, Bld: 75 mg/dL (ref 65–99)
Potassium: 4.8 mmol/L (ref 3.5–5.3)
SODIUM: 138 mmol/L (ref 135–146)
Total Bilirubin: 1 mg/dL (ref 0.2–1.2)
Total Protein: 7.1 g/dL (ref 6.1–8.1)

## 2018-08-09 LAB — CBC WITH DIFFERENTIAL/PLATELET
Basophils Absolute: 58 cells/uL (ref 0–200)
Basophils Relative: 0.6 %
EOS ABS: 301 {cells}/uL (ref 15–500)
Eosinophils Relative: 3.1 %
HEMATOCRIT: 45.5 % (ref 38.5–50.0)
Hemoglobin: 15.2 g/dL (ref 13.2–17.1)
LYMPHS ABS: 1969 {cells}/uL (ref 850–3900)
MCH: 28.5 pg (ref 27.0–33.0)
MCHC: 33.4 g/dL (ref 32.0–36.0)
MCV: 85.2 fL (ref 80.0–100.0)
MPV: 8.8 fL (ref 7.5–12.5)
Monocytes Relative: 7.8 %
NEUTROS PCT: 68.2 %
Neutro Abs: 6615 cells/uL (ref 1500–7800)
PLATELETS: 335 10*3/uL (ref 140–400)
RBC: 5.34 10*6/uL (ref 4.20–5.80)
RDW: 13.7 % (ref 11.0–15.0)
TOTAL LYMPHOCYTE: 20.3 %
WBC: 9.7 10*3/uL (ref 3.8–10.8)
WBCMIX: 757 {cells}/uL (ref 200–950)

## 2018-08-09 NOTE — Progress Notes (Signed)
Subjective:    Patient ID: Erik Hancock, male    Erik NewportOB: 1951/07/07, 67 y.o.   MRN: [id number]  Medication Refill    01/2017 Patient has not been seen in approximately a year. Overall he says he's been doing well. He admits that recently his sugars a been running higher as high as 200. He denies any polyuria or polydipsia. He does have blurry vision but he sees an eye specialist. He has been receiving injections in his eye due to neovascularization from diabetic retinopathy care of Dr. Ashley RoyaltyMatthews for the last several years. He is also recently had cataract surgery. Denies any chest pain shortness of breath or dyspnea on exertion. He does have a history of drug-eluting stents 3. He is still on dual antiplatelet therapy. However he has not seen a cardiologist in quite some time and therefore I believe that this is an oversight on my part area and I encouraged him to see his cardiologist for regular follow-up and he denies any myalgias or right upper quadrant pain.  At that time, my plan was: Blood pressure is controlled. I will check a hemoglobin A1c, a lipid panel, urine microalbumin, and a CMP. Goal LDL cholesterol is less than 70. Goal hemoglobin A1c is less than 7. Given his history of coronary artery disease, I would recommend adding jardiance due to the reduction in cardiovascular mortality. I discussed this at length with the patient. He would be willing to consider this as long as his affordable. Strongly encouraged the patient to follow-up with his cardiologist. I believe he can likely stop the Plavix as he is been on this medication for several years now. I would like him to continue aspirin  05/10/17 A1c was 8.4 at that time and we added jardiance 25 mg poqday.  He is here for follow up.  He has lost 7 pounds since his last visit. However he admits that he has not changed his diet nor is he exercising. He does not have any sugar readings to compare. He does report 3 random sugars of 114, 144, and  180. Some are fasting. Some of those numbers are random. He does report increasing urine output since starting the new medication. He also reports trouble sleeping. His wife states that he snores at night and makes unusual gasping sounds and sleep. He reports hypersomnolence during the day. He falls asleep easily in a chair. He does not fall asleep driving. However he can easily fall asleep after eating a meal even in the middle of the day. He has been told in the past that he has sleep apnea after he recovered from anesthesia. However this is remote.  At that time, my plan was: I will schedule patient to me with a neurologist for a split-level sleep study to evaluate for obstructive sleep apnea. His hypersomnolence, his body habitus, and some of his other symptoms suggest a high likelihood of structure sleep apnea. His blood pressure today is well controlled at 110/68. He denies any chest pain shortness of breath or dyspnea on exertion. I will check a fasting lipid panel. His goal LDL cholesterol is less than 70 given his history of ASCVD. I will also check a hemoglobin A1c. Ideally I like his hemoglobin A1c less than 7. Strongly recommended he start exercising 30 minutes a day 5 days a week and restrict his calorie intake to try to achieve 20-30 pounds weight loss gradually over the next 6 months.  12/10/17 HgA1c at that time was elevated at 8.4.  Patient is here for follow up.  He stopped his jardiance since I saw him.  Still on glucotrol, lantus 32 units a day, and metformin 1000 bid. The patient discontinue Jardiance due to polyuria.  He states his blood sugars were averaging between 120 and 145 however since discontinuation of the medication they are much higher. He is not watching his diet and he is not exercising. He is also overdue for a diabetic eye exam. He denies any neuropathy in his feet. He denies any blurry vision. He denies any chest pain shortness of breath or dyspnea on exertion. He denies any  myalgias or right upper quadrant pain. At that time, my plan was: Blood sugar today is adequately controlled. I will check a fasting lipid panel. Given his history of diabetes and coronary artery disease, his goal LDL cholesterol is less than 70. Given his history of retinopathy, I recommended he follow-up with his ophthalmologist as he has not seen his ophthalmologist at 16 months. I recommended checking his hemoglobin A1c. If greater than 8 as anticipated, I would recommend starting the patient on Victoza.  Flu shot is up-to-date. Diabetic foot exam is performed today  01/18/18 Please see labs.  Hemoglobin A1c was 9.3.  Therefore we added Victoza 1.2 mg daily and asked the patient to return in 1 month for recheck.  He brings in his sugars today and I am pleasantly surprised.  His fasting sugars in the morning are averaging between 70 and 147 with the vast majority 80-120.  His evening sugars are averaging 99-141 with the vast majority between 101 130.  These are outstanding.  He denies any nausea or vomiting.  His polyuria has resolved.  He is lost 5 pounds and strongly work to change his diet and is starting to exercise more.  At that time, my plan was: I am extremely proud of this patient.  His sugars have improved dramatically and he is losing weight.  Continue Victoza 1.2 mg subcu daily and recheck hemoglobin A1c and fasting lab work in 3 months.  I congratulated him on his success thus far.  07/19/18 Patient has not followed up as planned in May.  Furthermore he is not regularly checking his sugars.  This morning his fasting sugar was in the 90s however he also reports occasional sugars in the 180s.  I question how often he is actually been checking his sugars.  However he is being compliant with big toes a.  He is gained a pound since his last visit.  However the reason he made his visit has been worsening dyspnea on exertion.  Symptoms began approximately 1 month ago.  He states that whenever he is  outside working, especially in the hot air, he has a very difficult time catching his breath.  It is starting to occur even with minimal activity.  Furthermore he will break into a sweat.  Occasionally he will develop sharp pains on the left side of his chest that will radiate into his left arm.  These go away with rest.  Pattern of increasing dyspnea on exertion and chest pain with activity radiating into the left arm is concerning for angina.  He denies any chest pain at rest.  He denies any nausea or vomiting however he is concerned that he feels extremely weak and tired and short of breath with minimal activity.  AT that time, my plan was: On exam today, there is no JVD.  There are no crackles on pulmonary exam.  There is  no pitting edema in his extremities.  He does not appear to be fluid overloaded.  His lungs are clear to auscultation bilaterally.  My concern is that the patient is demonstrating signs of angina with increasing dyspnea on exertion.  Therefore I believe he needs to see his cardiologist as soon as possible for a stress test and possibly an echocardiogram.  In the meantime, I will check his lab work to monitor his risk factors including a CBC, fasting lipid panel, CMP, and hemoglobin A1c.  His goal LDL will be less than 60.  His goal hemoglobin A1c would be less than 7.  I will also add Imdur 30 mg poqday to see if this helps with his dyspnea on exertion and exertional chest pain.  I have cautioned the patient to be extremely cautious until he sees his cardiologist and not to aggressively push his activity until after he has had a stress test.  He is already on dual antiplatelet agent, aspirin and Plavix.  He is on Toprol-XL 25 mg a day and his heart rate is 60 bpm.  However I will discontinue simvastatin and replacing with Lipitor 80 mg a day for plaque stabilization as well as to optimize his statin.  EKG shows sinus rhythm with first-degree AV block.  There are Q waves in leads aVF and III.   Otherwise there are no ST-T segment changes.  08/09/18 Was admitted to the hospital.  I have copied relevant portions of the DC summary below for my reference: Admit date: 07/29/2018 Discharge date: 07/31/2018  Primary Care Provider: Donita Brooks, MD  Primary Cardiologist: Chilton Si, MD  Discharge Diagnoses    Principal Problem:   Angina, class II [facility]) Active Problems:   Diabetes mellitus (HCC)   Essential hypertension   Hyperlipidemia due to dietary fat intake   CAD S/P percutaneous coronary angioplasty   Morbid obesity (HCC)   Exertional dyspnea   Mobitz type 2 second degree atrioventricular block   CKD (chronic kidney disease), stage III (HCC)   Allergies      Allergies  Allergen Reactions  . Beta Adrenergic Blockers     Metoprolol stopped 07/2018 due to 2nd degree type 2 AV block.    Diagnostic Studies/Procedures    Cath 07/29/18 Conclusion     Ost LAD lesion is 85% stenosed -just prior to previous stent. Previously placed prox LAD DES stent is 5% stenosed.  Following scoring balloon angioplasty, A drug-eluting stent was successfully placed overlapping the previous stent proximally, using a STENT ORSIRO 3.5X13 --postdilated to 4.0 mm  Post intervention, there is a 0% residual stenosis.  Previously placed Prox Cx to Dist Cx stent (DES) is widely patent. Ost 2nd Mrg lesion is 40% stenosed.  Ost Cx to Prox Cx lesion is 30% stenosed.  Previously placed RPDA-1 stent (DES) is widely patent. RPDA-2 lesion is 40% stenosed just beyond stent.  The left ventricular systolic function is normal. The left ventricular ejection fraction is 55-65% by visual estimate. LV end diastolic pressure is normal.  Post intervention, there is a 5% residual stenosis.  A drug-eluting stent was successfully placed using a STENT ORSIRO 3.5X13.  Three-vessel CAD with stents in the LAD, circumflex and PDA. Culprit lesion is severe 95% proximal edge lesion and ostial LAD ->  successfully treated with scoring balloon angioplasty and DES stent overlapping the original stent (STENT ORSIRO 3.5X13 -postdilated to 4.0 mm) Patent stents in circumflex and RPDA Normal LV function with normal LVEDP.   As the patient  has an ostial LAD stent, I felt it more prudent to monitor the patient overnight as opposed to same-day discharge. He will be transferred to postprocedure unit for ongoing care.  Continue home medications with exception of his diabetes medications (will cover with sliding scale insulin)  Recommend dual antiplatelet therapy with Aspirin 81mg  daily and Ticagrelor 90mg  twice dailylong-term (beyond 12 months) because of Ostial LAD stent. Would be okay to stop aspirin after 3 to 6 months, but would continue Brilinta at 90 mg for 1 year and then reduced to 60 mg/convert to Plavix 75 mg after 1 year.[name].  He will follow-up with Dr. Chilton Siiffany Spencer.   Bryan Lemmaavid Harding, M.D., M.S. Interventional Cardiologist       _____________   History of Present Illness     67 y.o.malewith CAD (PCI of LAD 2003, PDA/LCx in 2007), DM, CKD III by labs. HTN, HLD, arthritis, GERD, morbid obesity was recently seen in the office to establish care on 07/26/18 with symptoms concerning for unstable angina. Pre-cath labs showed continued renal insufficiency so it was suggested to hold lisinopril 2 days prior to cath. He was admitted 07/29/2018 for planned procedure.  Hospital Course     1. Unstable angina/CAD - cath showed 85% ostial LAD just prior to previous stent, treated with DES overlapping previous stent. Otherwise nonobstructive disease noted. EF 55-65%, LVEDP normal. Per Dr. Herbie BaltimoreHarding, "Recommend dual antiplatelet therapy with Aspirin 81mg  daily and Ticagrelor 90mg  twice dailylong-term (beyond 12 months) because of Ostial LAD stent. Would be okay to stop aspirin after 3 to 6 months, but would continue Brilinta at 90 mg for 1 year and then reduced to 60 mg/convert to Plavix  75 mg after 1 year." Ultimate decision making will be up to Dr. Duke Salviaandolph about duration. Atorvastatin was continued (recent LDL 59). Metoprolol was stopped as below  2. Intermittent type 2 second degree AV block - this was initially noted the afternoon after cath. He was noted to have several episodes of Mobitz 2nd degree AV block type 2, with dropping up to 2 beats at a time. This was also noted while sleeping. He had taken his metoprolol at home prior to cath, but it was held once this was recognized. TSH was normal. He was totally asymptomatic. He wished to go home day after cath but it was recommended he stay for continued observation for one more night. After d/c of metoprolol, he only had episode overnight while sleeping. Dr. Ladona Ridgelaylor reviewed strips and case and felt no specific intervention was needed at this time and recommended watchful waiting for any symptoms to correlate. He did not feel event monitoring was necessary. Patient was advised to avoid HR-slowing medicines and call if any new symptoms. He denied h/o presyncope or syncope.  3. CKD stage III - Previously 1.24 in 11/2017. It was 1.57 prior to cath, and 1.45 post-cath. Dr. Duke Salviaandolph recommended to decrease lisinopril to 20mg  daily and add amlodipine 5mg  daily to accommodate BP control since BB was also stopped. He was advised to d/c NSAIDS. Lab order was entered for him to return Tuesday 9/10 to lab for BMET.  4. HTN -changes made as above. Likely also driven by #5.  5. Morbid obesity/suspected OSA - pending OP sleep study. Discussed role of weight in management.  6. DM - he was advised that he can resume Metformin tomorrow.  The patient feels well this morning. Dr. Duke Salviaandolph has seen and examined the patient today and feels he is stable for discharge. He has a  f/u appointment in 2 months but also needs a post cath check within 2 weeks. I have sent a message to our office's scheduling team requesting a follow-up appointment, and  our office will call the patient with this information. His usual [facility] is closed today but he will need to pick up rx's for Brilinta and SL NTG today - per d/w pt/wife, will [facility]. He will get amlodipine/lisinopril prior to DC today so we will route these to [facility]. Also received handwritten 30day free Brilinta rx. Admit date: 07/29/2018 Discharge date: 07/31/2018  Primary Care Provider: Donita Brooks, MD  Primary Cardiologist: Chilton Si, MD  Discharge Diagnoses    Principal Problem:   Angina, class II [facility]) Active Problems:   Diabetes mellitus (HCC)   Essential hypertension   Hyperlipidemia due to dietary fat intake   CAD S/P percutaneous coronary angioplasty   Morbid obesity (HCC)   Exertional dyspnea   Mobitz type 2 second degree atrioventricular block   CKD (chronic kidney disease), stage III (HCC)   Allergies      Allergies  Allergen Reactions  . Beta Adrenergic Blockers     Metoprolol stopped 07/2018 due to 2nd degree type 2 AV block.    Diagnostic Studies/Procedures    Cath 07/29/18 Conclusion     Ost LAD lesion is 85% stenosed -just prior to previous stent. Previously placed prox LAD DES stent is 5% stenosed.  Following scoring balloon angioplasty, A drug-eluting stent was successfully placed overlapping the previous stent proximally, using a STENT ORSIRO 3.5X13 --postdilated to 4.0 mm  Post intervention, there is a 0% residual stenosis.  Previously placed Prox Cx to Dist Cx stent (DES) is widely patent. Ost 2nd Mrg lesion is 40% stenosed.  Ost Cx to Prox Cx lesion is 30% stenosed.  Previously placed RPDA-1 stent (DES) is widely patent. RPDA-2 lesion is 40% stenosed just beyond stent.  The left ventricular systolic function is normal. The left ventricular ejection fraction is 55-65% by visual estimate. LV end diastolic pressure is normal.  Post intervention, there is a 5% residual stenosis.  A drug-eluting  stent was successfully placed using a STENT ORSIRO 3.5X13.  Three-vessel CAD with stents in the LAD, circumflex and PDA. Culprit lesion is severe 95% proximal edge lesion and ostial LAD -> successfully treated with scoring balloon angioplasty and DES stent overlapping the original stent (STENT ORSIRO 3.5X13 -postdilated to 4.0 mm) Patent stents in circumflex and RPDA Normal LV function with normal LVEDP.   As the patient has an ostial LAD stent, I felt it more prudent to monitor the patient overnight as opposed to same-day discharge. He will be transferred to postprocedure unit for ongoing care.  Continue home medications with exception of his diabetes medications (will cover with sliding scale insulin)  Recommend dual antiplatelet therapy with Aspirin 81mg  daily and Ticagrelor 90mg  twice dailylong-term (beyond 12 months) because of Ostial LAD stent. Would be okay to stop aspirin after 3 to 6 months, but would continue Brilinta at 90 mg for 1 year and then reduced to 60 mg/convert to Plavix 75 mg after 1 year.[name]  He will follow-up with Dr. Chilton Si.   Bryan Lemma, M.D., M.S. Interventional Cardiologist       _____________   History of Present Illness     67 y.o.malewith CAD (PCI of LAD 2003, PDA/LCx in 2007), DM, CKD III by labs. HTN, HLD, arthritis, GERD, morbid obesity was recently seen in the office to establish care  on 07/26/18 with symptoms concerning for unstable angina. Pre-cath labs showed continued renal insufficiency so it was suggested to hold lisinopril 2 days prior to cath. He was admitted 07/29/2018 for planned procedure.  Hospital Course     1. Unstable angina/CAD - cath showed 85% ostial LAD just prior to previous stent, treated with DES overlapping previous stent. Otherwise nonobstructive disease noted. EF 55-65%, LVEDP normal. Per Dr. Herbie Baltimore, "Recommend dual antiplatelet therapy with Aspirin 81mg  daily and Ticagrelor 90mg  twice dailylong-term  (beyond 12 months) because of Ostial LAD stent. Would be okay to stop aspirin after 3 to 6 months, but would continue Brilinta at 90 mg for 1 year and then reduced to 60 mg/convert to Plavix 75 mg after 1 year." Ultimate decision making will be up to Dr. Duke Salvia about duration. Atorvastatin was continued (recent LDL 59). Metoprolol was stopped as below  2. Intermittent type 2 second degree AV block - this was initially noted the afternoon after cath. He was noted to have several episodes of Mobitz 2nd degree AV block type 2, with dropping up to 2 beats at a time. This was also noted while sleeping. He had taken his metoprolol at home prior to cath, but it was held once this was recognized. TSH was normal. He was totally asymptomatic. He wished to go home day after cath but it was recommended he stay for continued observation for one more night. After d/c of metoprolol, he only had episode overnight while sleeping. Dr. Ladona Ridgel reviewed strips and case and felt no specific intervention was needed at this time and recommended watchful waiting for any symptoms to correlate. He did not feel event monitoring was necessary. Patient was advised to avoid HR-slowing medicines and call if any new symptoms. He denied h/o presyncope or syncope.  3. CKD stage III - Previously 1.24 in 11/2017. It was 1.57 prior to cath, and 1.45 post-cath. Dr. Duke Salvia recommended to decrease lisinopril to 20mg  daily and add amlodipine 5mg  daily to accommodate BP control since BB was also stopped. He was advised to d/c NSAIDS. Lab order was entered for him to return Tuesday 9/10 to lab for BMET.  4. HTN -changes made as above. Likely also driven by #5.  5. Morbid obesity/suspected OSA - pending OP sleep study. Discussed role of weight in management.  6. DM - he was advised that he can resume Metformin tomorrow.  The patient feels well this morning. Dr. Duke Salvia has seen and examined the patient today and feels he is stable for  discharge. He has a f/u appointment in 2 months but also needs a post cath check within 2 weeks. I have sent a message to our office's scheduling team requesting a follow-up appointment, and our office will call the patient with this information. His usual [facility] is closed today but he will need to pick up rx's for Brilinta and SL NTG today - per d/w pt/wife, [facility]. He will get amlodipine/lisinopril prior to DC today so we will route these to [facility]. Also received handwritten 30day free Brilinta rx.  Doing very well.  Chest pain and left arm pain has resolved.  Still with mild sob with activity.  EF on cath was reportedly normal.  Has not had a CXR.  Denies cough, pleurisy.  Does have a history of pulmonary nodule on CT from 2013 but patient never went for follow up CT.  Recent HgA1c was 7.7.  He has tried to eat better and his  FBS is 99-120.  He is not checking postprandial sugars.   Past Medical History:  Diagnosis Date  . Arthritis    "all over" (07/29/2018)  . CAD S/P percutaneous coronary angioplasty    a. stent to mLAD 11/2001. b. DES to PDA/mLCx 2007. c. DES to ostial LAD 07/2018.  [name]. Chronic lower back pain   . CKD (chronic kidney disease), stage III (HCC)   . CKD (chronic kidney disease), stage III (HCC)   . Diabetic retinopathy (HCC)   . GERD (gastroesophageal reflux disease)   . High cholesterol   . History of kidney stones   . Hypertension   . Mobitz type 2 second degree atrioventricular block    a. noted during 07/2018 adm, metoprolol discontinued.  . Morbid obesity (HCC) 07/28/2018  . Prostatitis   . Pulmonary nodule 2013   CT  . Sleep apnea    stopbang=5  . Suspected sleep apnea   . Type II diabetes mellitus (HCC)    Past Surgical History:  Procedure Laterality Date  . BACK SURGERY    . CARDIAC CATHETERIZATION  JUNE 2003   PATENT LAD STENT/ BODERLINE OBSTRUCTIVE DISEASE POSTERIOR DESCENDING ARTERY/ NORMAL LVF  . CATARACT EXTRACTION W/  INTRAOCULAR LENS  IMPLANT, BILATERAL Bilateral 2017  . CERVICAL SCOVILLE FORAMINOTOMY W/ EXCISION OF HERNIATED NUCLEC PULPOSUS  2009   C6 - 7  . CORONARY ANGIOPLASTY WITH STENT PLACEMENT  03/30/2006   DR EDMUNDS - 90% LCx@OM2  TAXUS  DES 3.0 X 20 -> 3.25 mm,   95% RPDA -- TAXUS DES 3.0 X 16 --> 3.5 mm  . CORONARY ANGIOPLASTY WITH STENT PLACEMENT  11/2001   (Dr. Ty HiltsEdmonds for Dr. Mayford Knifeurner) - mLAD@D2  - TAXUS EXPRESS DES 3.5 x 15   . CORONARY ANGIOPLASTY WITH STENT PLACEMENT  07/29/2018  . CORONARY STENT INTERVENTION N/A 07/29/2018   Procedure: CORONARY STENT INTERVENTION;  Surgeon: Marykay LexHarding, David W, MD;  [facility];  Service: Cardiovascular;  Laterality: N/A;  . CYSTOSCOPY W/ RETROGRADES  05/04/2012   Procedure: CYSTOSCOPY WITH RETROGRADE PYELOGRAM;  Surgeon: Milford Cageaniel Young Woodruff, MD;  Location: [facility];  Service: Urology;  Laterality: Left;  . CYSTOSCOPY W/ URETERAL STENT PLACEMENT  05/04/2012   Procedure: CYSTOSCOPY WITH STENT REPLACEMENT;  Surgeon: Milford Cageaniel Young Woodruff, MD;  Location: [facility];  Service: Urology;  Laterality: Left;  . CYSTOSCOPY WITH STENT PLACEMENT Left 04/04/2012  . CYSTOSCOPY WITH URETEROSCOPY  05/04/2012   Procedure: CYSTOSCOPY WITH URETEROSCOPY;  Surgeon: Milford Cageaniel Young Woodruff, MD;  Location: [facility];  Service: Urology;  Laterality: Left;      . LEFT HEART CATH AND CORONARY ANGIOGRAPHY N/A 07/29/2018   Procedure: LEFT HEART CATH AND CORONARY ANGIOGRAPHY;  Surgeon: Marykay LexHarding, David W, MD;  [facility];  Service: Cardiovascular;  Laterality: N/A;  . LEFT URETEROSCOPIC STONE EXTRACTION  03-14-2003   X2   Current Outpatient Medications on File Prior to Visit  Medication Sig Dispense Refill  . ACCU-CHEK COMPACT PLUS test strip USE TO CHECK THREE TIMES A DAY 300 each 2  . amLODipine (NORVASC) 5 MG tablet Take 1 tablet (5 mg total) by mouth daily. 30 tablet 6  . aspirin 81 MG tablet Take 81 mg by mouth daily.    [name]. atorvastatin (LIPITOR) 80 MG tablet  Take 1 tablet (80 mg total) by mouth daily. 90 tablet 3  . glipiZIDE (GLUCOTROL) 10 MG tablet TAKE 1 TABLET BY MOUTH ONCE DAILY 90 tablet 1  . glucose blood (ACCU-CHEK AVIVA) test strip Check  bs bid - tid 100 each 12  . Insulin Pen Needle 31G X 8 MM MISC Use daily with Victoza 100 each 3  . isosorbide mononitrate (IMDUR) 30 MG 24 hr tablet Take 1 tablet (30 mg total) by mouth daily. 90 tablet 1  . JARDIANCE 25 MG TABS tablet TAKE 1 TABLET BY MOUTH ONCE DAILY 90 tablet 0  . LANTUS SOLOSTAR 100 UNIT/ML Solostar Pen INJECT 32 UNITS UNDER THE SKIN EVERY MORNING (Patient taking differently: Inject 32 Units into the skin daily. INJECT 32 UNITS UNDER THE SKIN EVERY MORNIN) 30 mL 3  . liraglutide (VICTOZA) 18 MG/3ML SOPN Inject 0.2 mLs (1.2 mg total) into the skin daily. (Patient taking differently: Inject 1.2 mg into the skin every evening. ) 18 pen 2  . lisinopril (PRINIVIL,ZESTRIL) 20 MG tablet Take 1 tablet (20 mg total) by mouth daily. 30 tablet 6  . metFORMIN (GLUCOPHAGE) 1000 MG tablet TAKE 1 TABLET BY MOUTH TWICE DAILY 180 tablet 0  . nitroGLYCERIN (NITROSTAT) 0.4 MG SL tablet Place 1 tablet (0.4 mg total) under the tongue every 5 (five) minutes as needed for chest pain (up to 3 doses. If taking 3rd dose call 911). 25 tablet 3  . pantoprazole (PROTONIX) 40 MG tablet TAKE 1 TABLET BY MOUTH ONCE DAILY 90 tablet 0  . ticagrelor (BRILINTA) 90 MG TABS tablet Take 1 tablet (90 mg total) by mouth 2 (two) times daily. 60 tablet 11   No current facility-administered medications on file prior to visit.    Allergies  Allergen Reactions  . Beta Adrenergic Blockers     Metoprolol stopped 07/2018 due to 2nd degree type 2 AV block.   Social History   Socioeconomic History  . Marital status: Married    Spouse name: Not on file  . Number of children: Not on file  . Years of education: Not on file  . Highest education level: Not on file  Occupational History  . Not on file  Social Needs  . Financial  resource strain: Not on file  . Food insecurity:    Worry: Not on file    Inability: Not on file  . Transportation needs:    Medical: Not on file    Non-medical: Not on file  Tobacco Use  . Smoking status: Never Smoker  . Smokeless tobacco: Never Used  Substance and Sexual Activity  . Alcohol use: Never    Frequency: Never  . Drug use: Never  . Sexual activity: Not Currently  Lifestyle  . Physical activity:    Days per week: Not on file    Minutes per session: Not on file  . Stress: Not on file  Relationships  . Social connections:    Talks on phone: Not on file    Gets together: Not on file    Attends religious service: Not on file    Active member of club or organization: Not on file    Attends meetings of clubs or organizations: Not on file    Relationship status: Not on file  . Intimate partner violence:    Fear of current or ex partner: Not on file    Emotionally abused: Not on file    Physically abused: Not on file    Forced sexual activity: Not on file  Other Topics Concern  . Not on file  Social History Narrative  . Not on file      Review of Systems  All other systems reviewed and are negative.  Objective:   Physical Exam  Constitutional: He appears well-developed and well-nourished.  Neck: Neck supple. No JVD present. No thyromegaly present.  Cardiovascular: Normal rate, regular rhythm, normal heart sounds and intact distal pulses.  No murmur heard. Pulmonary/Chest: Effort normal and breath sounds normal. No respiratory distress. He has no wheezes. He has no rales. He exhibits no tenderness.  Abdominal: Soft. Bowel sounds are normal. He exhibits no distension and no mass. There is no tenderness. There is no rebound and no guarding.  Musculoskeletal: He exhibits no edema.  Lymphadenopathy:    He has no cervical adenopathy.  Skin: No rash noted. No erythema.  Vitals reviewed.         Assessment & Plan:   SOB (shortness of breath) - Plan:  DG Chest 2 View, CBC with Differential/Platelet, COMPLETE METABOLIC PANEL WITH GFR  Hospital DC follow up  Obtain CXR given sob but I suspect dyspnea is due to deconditioning.  Already on dual antiplatelet agents, high intensity statin, beta blocker but glycemic control is suspect.  Recommend FBS and 2 hr PPS be recorded and reviewed in 2 weeks.  Recommended gradually increasing exercise to improve deconditioning.  Recommended 10-20 lbs of weight loss. Recheck fasting labs in 3 months.

## 2018-08-10 ENCOUNTER — Ambulatory Visit
Admission: RE | Admit: 2018-08-10 | Discharge: 2018-08-10 | Disposition: A | Discharge status: Home / Self Care | Payer: Medicare Other | Source: Ambulatory Visit | Attending: Family Medicine | Admitting: Family Medicine

## 2018-08-10 DIAGNOSIS — R0602 Shortness of breath: Secondary | ICD-10-CM

## 2018-08-10 NOTE — Progress Notes (Signed)
Cardiology Office Note:    Date:  08/11/2018   ID:  [name], DOB 1951-04-23, MRN [id number]  PCP:  Donita Brooks, MD  Cardiologist:  Chilton Si, MD   Referring MD: Donita Brooks, MD   Chief Complaint  Patient presents with  . Shortness of Breath    states still feels SOB    History of Present Illness:    EYDAN CHIANESE is a 67 y.o. male with a hx of hypertension, CAD status post PCI (PCI of LAD 2003, PDA/LCx in 2007), Mobitz type II second-degree AV block, hyperlipidemia, morbid obesity, and CKD stage III.  He was recently seen in the clinic on 07/26/2018 with symptoms concerning for unstable angina.  He was scheduled for heart catheterization.  He was hospitalized for IV hydration prior to procedure given his renal insufficiency.  Heart catheterization showed 85% ostial LAD prior to the previous stent, treated with DES overlapping previous stent.  Dual antiplatelet therapy was recommended with aspirin and Brilinta 90 mg twice daily okay to stop aspirin after 3 to 6 months but would continue Brilinta at 90 mg for 1 year then reduce to 60 mg twice daily vs plavix. Duration per Dr. Hektor Huston Salvia. Beta blocker was stopped for intermittent type 2 second degree AV block.  Dr. Ladona Ridgel reviewed case with no specific interventions recommended.  He did not recommend event monitoring at that time.  We will continue to avoid beta-blockers and heart-rate slowing medications.  He was ultimately discharged on 07/31/2018.  He returns today for hospital follow-up and is here with his wife.  Since heart catheterization, he has done well.  He denies chest pain, lower extremity swelling, and syncope.  He saw his PCP yesterday and reported continued shortness of breath.  Chest x-ray was ordered and is without cardiopulmonary disease.  His PCP told him that he was just out of shape/deconditioned.  He would like to stop taking the Imdur, that was prescribed prior to his heart catheterization.  He has no  residual disease and I agreed with this.  Will increase Norvasc for adequate pressure control after after discontinuing Imdur.  He will keep a blood pressure log and bring to follow-up appointment.  He is having no bleeding problems with aspirin and Brilinta.  He is compliant on all medications.  We discussed cardiac rehab.  Past Medical History:  Diagnosis Date  . Arthritis    "all over" (07/29/2018)  . CAD S/P percutaneous coronary angioplasty    a. stent to mLAD 11/2001. b. DES to PDA/mLCx 2007. c. DES to ostial LAD 07/2018.  [name] Chronic lower back pain   . CKD (chronic kidney disease), stage III (HCC)   . CKD (chronic kidney disease), stage III (HCC)   . Diabetic retinopathy (HCC)   . GERD (gastroesophageal reflux disease)   . High cholesterol   . History of kidney stones   . Hypertension   . Mobitz type 2 second degree atrioventricular block    a. noted during 07/2018 adm, metoprolol discontinued.  . Morbid obesity (HCC) 07/28/2018  . Prostatitis   . Pulmonary nodule 2013   CT  . Sleep apnea    stopbang=5  . Suspected sleep apnea   . Type II diabetes mellitus (HCC)     Past Surgical History:  Procedure Laterality Date  . BACK SURGERY    . CARDIAC CATHETERIZATION  JUNE 2003   PATENT LAD STENT/ BODERLINE OBSTRUCTIVE DISEASE POSTERIOR DESCENDING ARTERY/ NORMAL LVF  . CATARACT EXTRACTION W/ INTRAOCULAR  LENS  IMPLANT, BILATERAL Bilateral 2017  . CERVICAL SCOVILLE FORAMINOTOMY W/ EXCISION OF HERNIATED NUCLEC PULPOSUS  2009   C6 - 7  . CORONARY ANGIOPLASTY WITH STENT PLACEMENT  03/30/2006   DR EDMUNDS - 90% LCx@OM2  TAXUS  DES 3.0 X 20 -> 3.25 mm,   95% RPDA -- TAXUS DES 3.0 X 16 --> 3.5 mm  . CORONARY ANGIOPLASTY WITH STENT PLACEMENT  11/2001   (Dr. Ty Hilts for Dr. Mayford Knife) - mLAD@D2  - TAXUS EXPRESS DES 3.5 x 15   . CORONARY ANGIOPLASTY WITH STENT PLACEMENT  07/29/2018  . CORONARY STENT INTERVENTION N/A 07/29/2018   Procedure: CORONARY STENT INTERVENTION;  Surgeon: Marykay Lex, MD;   Location: [facility] INVASIVE CV LAB;  Service: Cardiovascular;  Laterality: N/A;  . CYSTOSCOPY W/ RETROGRADES  05/04/2012   Procedure: CYSTOSCOPY WITH RETROGRADE PYELOGRAM;  Surgeon: Milford Cage, MD;  Location: [facility];  Service: Urology;  Laterality: Left;  . CYSTOSCOPY W/ URETERAL STENT PLACEMENT  05/04/2012   Procedure: CYSTOSCOPY WITH STENT REPLACEMENT;  Surgeon: Milford Cage, MD;  Location: [facility];  Service: Urology;  Laterality: Left;  . CYSTOSCOPY WITH STENT PLACEMENT Left 04/04/2012  . CYSTOSCOPY WITH URETEROSCOPY  05/04/2012   Procedure: CYSTOSCOPY WITH URETEROSCOPY;  Surgeon: Milford Cage, MD;  Location: [facility];  Service: Urology;  Laterality: Left;      . LEFT HEART CATH AND CORONARY ANGIOGRAPHY N/A 07/29/2018   Procedure: LEFT HEART CATH AND CORONARY ANGIOGRAPHY;  Surgeon: Marykay Lex, MD;  Location: [facility] INVASIVE CV LAB;  Service: Cardiovascular;  Laterality: N/A;  . LEFT URETEROSCOPIC STONE EXTRACTION  03-14-2003   X2    Current Medications: Current Meds  Medication Sig  . ACCU-CHEK COMPACT PLUS test strip USE TO CHECK THREE TIMES A DAY  . amLODipine (NORVASC) 10 MG tablet Take 1 tablet (10 mg total) by mouth daily.  [name] aspirin 81 MG tablet Take 81 mg by mouth daily.  [name] atorvastatin (LIPITOR) 80 MG tablet Take 1 tablet (80 mg total) by mouth daily.  [name] glipiZIDE (GLUCOTROL) 10 MG tablet TAKE 1 TABLET BY MOUTH ONCE DAILY  . glucose blood (ACCU-CHEK AVIVA) test strip Check bs bid - tid  . Insulin Pen Needle 31G X 8 MM MISC Use daily with Victoza  . JARDIANCE 25 MG TABS tablet TAKE 1 TABLET BY MOUTH ONCE DAILY  . LANTUS SOLOSTAR 100 UNIT/ML Solostar Pen INJECT 32 UNITS UNDER THE SKIN EVERY MORNING (Patient taking differently: Inject 32 Units into the skin daily. INJECT 32 UNITS UNDER THE SKIN EVERY MORNIN)  . liraglutide (VICTOZA) 18 MG/3ML SOPN Inject 0.2 mLs (1.2 mg total) into the skin daily. (Patient taking differently: Inject 1.2 mg into the skin every  evening. )  . lisinopril (PRINIVIL,ZESTRIL) 20 MG tablet Take 1 tablet (20 mg total) by mouth daily.  . metFORMIN (GLUCOPHAGE) 1000 MG tablet TAKE 1 TABLET BY MOUTH TWICE DAILY  . nitroGLYCERIN (NITROSTAT) 0.4 MG SL tablet Place 1 tablet (0.4 mg total) under the tongue every 5 (five) minutes as needed for chest pain (up to 3 doses. If taking 3rd dose call 911).  . pantoprazole (PROTONIX) 40 MG tablet TAKE 1 TABLET BY MOUTH ONCE DAILY  . ticagrelor (BRILINTA) 90 MG TABS tablet Take 1 tablet (90 mg total) by mouth 2 (two) times daily.  . [DISCONTINUED] amLODipine (NORVASC) 5 MG tablet Take 1 tablet (5 mg total) by mouth daily.  . [DISCONTINUED] isosorbide mononitrate (IMDUR) 30 MG 24 hr tablet Take 1 tablet (30 mg  total) by mouth daily.     Allergies:   Beta adrenergic blockers   Social History   Socioeconomic History  . Marital status: Married    Spouse name: Not on file  . Number of children: Not on file  . Years of education: Not on file  . Highest education level: Not on file  Occupational History  . Not on file  Social Needs  . Financial resource strain: Not on file  . Food insecurity:    Worry: Not on file    Inability: Not on file  . Transportation needs:    Medical: Not on file    Non-medical: Not on file  Tobacco Use  . Smoking status: Never Smoker  . Smokeless tobacco: Never Used  Substance and Sexual Activity  . Alcohol use: Never    Frequency: Never  . Drug use: Never  . Sexual activity: Not Currently  Lifestyle  . Physical activity:    Days per week: Not on file    Minutes per session: Not on file  . Stress: Not on file  Relationships  . Social connections:    Talks on phone: Not on file    Gets together: Not on file    Attends religious service: Not on file    Active member of club or organization: Not on file    Attends meetings of clubs or organizations: Not on file    Relationship status: Not on file  Other Topics Concern  . Not on file  Social  History Narrative  . Not on file     Family History: The patient's family history includes Alzheimer's disease in his father; Heart attack in his father and paternal grandfather; Heart disease in his father and paternal grandfather; Hypertension in his mother; Lupus in his sister.  ROS:   Please see the history of present illness.     All other systems reviewed and are negative.  EKGs/Labs/Other Studies Reviewed:    The following studies were reviewed today:  Heart cath 07/29/18:  Ost LAD lesion is 85% stenosed -just prior to previous stent. Previously placed prox LAD DES stent is 5% stenosed.  Following scoring balloon angioplasty, A drug-eluting stent was successfully placed overlapping the previous stent proximally, using a STENT ORSIRO 3.5X13 --postdilated to 4.0 mm  Post intervention, there is a 0% residual stenosis.  Previously placed Prox Cx to Dist Cx stent (DES) is widely patent. Ost 2nd Mrg lesion is 40% stenosed.  Ost Cx to Prox Cx lesion is 30% stenosed.  Previously placed RPDA-1 stent (DES) is widely patent. RPDA-2 lesion is 40% stenosed just beyond stent.  The left ventricular systolic function is normal. The left ventricular ejection fraction is 55-65% by visual estimate. LV end diastolic pressure is normal.  Post intervention, there is a 5% residual stenosis.  A drug-eluting stent was successfully placed using a STENT ORSIRO 3.5X13.   Three-vessel CAD with stents in the LAD, circumflex and PDA. Culprit lesion is severe 95% proximal edge lesion and ostial LAD -> successfully treated with scoring balloon angioplasty and DES stent overlapping the original stent (STENT ORSIRO 3.5X13 -postdilated to 4.0 mm) Patent stents in circumflex and RPDA Normal LV function with normal LVEDP.   As the patient has an ostial LAD stent, I felt it more prudent to monitor the patient overnight as opposed to same-day discharge.  He will be transferred to postprocedure unit for  ongoing care.  Continue home medications with exception of his diabetes medications (will cover with  sliding scale insulin)  Recommend dual antiplatelet therapy with Aspirin 81mg  daily and Ticagrelor 90mg  twice daily long-term (beyond 12 months) because of Ostial LAD stent.  Would be okay to stop aspirin after 3 to 6 months, but would continue Brilinta at 90 mg for 1 year and then reduced to 60 mg/convert to Plavix 75 mg after 1 year..   EKG:  EKG is ordered today.  The ekg ordered today demonstrates sinus with first degree heart block.   Recent Labs: 07/30/2018: TSH 1.867 08/09/2018: ALT 35; BUN 17; Creat 1.21; Hemoglobin 15.2; Platelets 335; Potassium 4.8; Sodium 138  Recent Lipid Panel    Component Value Date/Time   CHOL 129 07/19/2018 1203   TRIG 117 07/19/2018 1203   HDL 50 07/19/2018 1203   CHOLHDL 2.6 07/19/2018 1203   VLDL 29 05/10/2017 0832   LDLCALC 59 07/19/2018 1203    Physical Exam:    VS:  BP (!) 134/56   Pulse 74   Ht 5\' 9"  (1.753 m)   Wt 286 lb 9.6 oz (130 kg)   BMI 42.32 kg/m     Wt Readings from Last 3 Encounters:  08/11/18 286 lb 9.6 oz (130 kg)  08/09/18 285 lb (129.3 kg)  07/31/18 282 lb 14.4 oz (128.3 kg)     GEN:  Well nourished, well developed in no acute distress HEENT: Normal NECK: No JVD; No carotid bruits CARDIAC: RRR, no murmurs, rubs, gallops RESPIRATORY:  Clear to auscultation without rales, wheezing or rhonchi  ABDOMEN: Soft, non-tender, non-distended MUSCULOSKELETAL:  No edema; No deformity  SKIN: Warm and dry NEUROLOGIC:  Alert and oriented x 3 PSYCHIATRIC:  Normal affect   ASSESSMENT:    1. CAD S/P percutaneous coronary angioplasty   2. Essential hypertension   3. First degree heart block   4. CKD (chronic kidney disease), stage III (HCC)   5. Hyperlipidemia due to dietary fat intake   6. Morbid obesity (HCC)    PLAN:    In order of problems listed above:  CAD S/P percutaneous coronary angioplasty He has done well since  his heart catheterization continue aspirin and Brilinta.  He is having no bleeding problems.  Lab work from 2 days ago with stable hemoglobin.  Essential hypertension - Plan: EKG 12-Lead He wishes to stop taking the Imdur.  In review of his heart cath, he has no significant residual disease that should cause anginal.  I agree with stopping Imdur and increasing Norvasc for pressure control.  He will keep a blood pressure log and bring to his next visit.  First degree heart block Seen while hospitalized.  Stable.  We will continue to avoid beta-blockers and heart slowing medications per EP.  CKD (chronic kidney disease), stage III (HCC) Checked by PCP, stable.  Hyperlipidemia due to dietary fat intake 07/19/2018: Cholesterol 129; HDL 50; LDL Cholesterol (Calc) 59; Triglycerides 117 Continue statin medication.  Morbid obesity (HCC) He complains of shortness of breath.  Chest x-ray ordered by PCP was negative for cardiopulmonary pulmonary disease.  Discussed deconditioning, importance of cardiac rehab, and walking program.  Follow-up with Dr. Mattix Imhof Salvia as scheduled.  Medication Adjustments/Labs and Tests Ordered: Current medicines are reviewed at length with the patient today.  Concerns regarding medicines are outlined above.  Orders Placed This Encounter  Procedures  . EKG 12-Lead   Meds ordered this encounter  Medications  . amLODipine (NORVASC) 10 MG tablet    Sig: Take 1 tablet (10 mg total) by mouth daily.    Dispense:  90 tablet    Refill:  2    Discontinue IMDUR and Discontinue Norvasc 5mg  tablet    Signed, Marcelino Duster, [place]  08/11/2018 9:34 AM    [facility]

## 2018-08-11 ENCOUNTER — Encounter: Payer: Self-pay | Admitting: Physician Assistant

## 2018-08-11 ENCOUNTER — Ambulatory Visit: Payer: Medicare Other | Admitting: Physician Assistant

## 2018-08-11 VITALS — BP 134/56 | HR 74 | Ht 69.0 in | Wt 286.6 lb

## 2018-08-11 DIAGNOSIS — E7849 Other hyperlipidemia: Secondary | ICD-10-CM

## 2018-08-11 DIAGNOSIS — I440 Atrioventricular block, first degree: Secondary | ICD-10-CM | POA: Insufficient documentation

## 2018-08-11 DIAGNOSIS — I10 Essential (primary) hypertension: Secondary | ICD-10-CM | POA: Diagnosis not present

## 2018-08-11 DIAGNOSIS — N183 Chronic kidney disease, stage 3 unspecified: Secondary | ICD-10-CM

## 2018-08-11 DIAGNOSIS — I2510 Atherosclerotic heart disease of native coronary artery without angina pectoris: Secondary | ICD-10-CM

## 2018-08-11 DIAGNOSIS — Z9861 Coronary angioplasty status: Secondary | ICD-10-CM

## 2018-08-11 MED ORDER — AMLODIPINE BESYLATE 10 MG PO TABS: 10.0000 mg | ORAL_TABLET | Freq: Every day | ORAL | 2 refills | Status: DC

## 2018-08-11 NOTE — Patient Instructions (Addendum)
Medication Instructions:  STOP IMDUR INCREASE Norvasc(amlodopine) to 10mg  Take 1 tablet once a day or 2(5mg ) tablets daily BLOOD PRESSURE READINGS LOG GIVEN If you need a refill on your cardiac medications before your next appointment, please call your pharmacy.  Labwork: none  Testing/Procedures: None   Follow-Up: Follow up as scheduled  Any Other Special Instructions Will Be Listed Below (If Applicable). RECORD YOUR BLOOD PRESSURE EVERY DAY 2 HOURS AFTER MEDS WITH MORNING BREAKFAST AND BRING YOUR READINGS TO YOUR NEXT APPOINTMENT.

## 2018-08-22 ENCOUNTER — Ambulatory Visit (HOSPITAL_BASED_OUTPATIENT_CLINIC_OR_DEPARTMENT_OTHER): Payer: Medicare Other | Attending: Cardiovascular Disease | Admitting: Cardiovascular Disease

## 2018-08-22 VITALS — Ht 67.0 in | Wt 280.0 lb

## 2018-08-22 DIAGNOSIS — R0681 Apnea, not elsewhere classified: Secondary | ICD-10-CM

## 2018-08-22 DIAGNOSIS — G4733 Obstructive sleep apnea (adult) (pediatric): Secondary | ICD-10-CM | POA: Diagnosis not present

## 2018-08-22 DIAGNOSIS — R400 Somnolence: Secondary | ICD-10-CM | POA: Insufficient documentation

## 2018-09-11 ENCOUNTER — Encounter (HOSPITAL_BASED_OUTPATIENT_CLINIC_OR_DEPARTMENT_OTHER): Payer: Self-pay | Admitting: Cardiovascular Disease

## 2018-09-11 NOTE — Procedures (Signed)
Patient Name: Erik Hancock, Erik Hancock Date: 08/22/2018 Gender: Male D.O.B: 1951/03/05 Age (years): 67 Referring Provider: Skeet Latch Height (inches): 75 Interpreting Physician: Shelva Majestic MD, ABSM Weight (lbs): 280 RPSGT: Laren Everts BMI: 44 MRN: [id number] Neck Size: 18.50  CLINICAL INFORMATION Sleep Study Type: Split Night CPAP  Indication for sleep study: Diabetes, Excessive Daytime Sleepiness, Fatigue, Hypertension, Obesity, Sleep walking/talking/parasomnias, Witnessed Apneas  Epworth Sleepiness Score: 5  SLEEP STUDY TECHNIQUE As per the AASM Manual for the Scoring of Sleep and Associated Events v2.3 (April 2016) with a hypopnea requiring 4% desaturations.  The channels recorded and monitored were frontal, central and occipital EEG, electrooculogram (EOG), submentalis EMG (chin), nasal and oral airflow, thoracic and abdominal wall motion, anterior tibialis EMG, snore microphone, electrocardiogram, and pulse oximetry. Continuous positive airway pressure (CPAP) was initiated when the patient met split night criteria and was titrated according to treat sleep-disordered breathing.  MEDICATIONS       amLODipine (NORVASC) 10 MG tablet    aspirin 81 MG tablet    atorvastatin (LIPITOR) 80 MG tablet    glipiZIDE (GLUCOTROL) 10 MG tablet    glucose blood (ACCU-CHEK AVIVA) test strip    Insulin Pen Needle 31G X 8 MM MISC    JARDIANCE 25 MG TABS tablet    LANTUS SOLOSTAR 100 UNIT/ML Solostar Pen    liraglutide (VICTOZA) 18 MG/3ML SOPN    lisinopril (PRINIVIL,ZESTRIL) 20 MG tablet    metFORMIN (GLUCOPHAGE) 1000 MG tablet    nitroGLYCERIN (NITROSTAT) 0.4 MG SL tablet    pantoprazole (PROTONIX) 40 MG tablet    ticagrelor (BRILINTA) 90 MG TABS tablet     Medications self-administered by patient taken the night of the study : N/A  RESPIRATORY PARAMETERS Diagnostic Total AHI (/hr): 21.6 RDI (/hr): 37.9 OA Index (/hr): 5.4 CA Index (/hr): 2.0 REM AHI  (/hr): 43.6 NREM AHI (/hr): 20.6 Supine AHI (/hr): 26.4 Non-supine AHI (/hr): 17.8 Min O2 Sat (%): 88.0 Mean O2 (%): 93.4 Time below 88% (min): 0.7   Titration Optimal Pressure (cm): 10 AHI at Optimal Pressure (/hr): 16.9 Min O2 at Optimal Pressure (%): 91.0 Supine % at Optimal (%): 0 Sleep % at Optimal (%): 86   SLEEP ARCHITECTURE The recording time for the entire night was 457.4 minutes.  During a baseline period of 179.4 minutes, the patient slept for 122.0 minutes in REM and nonREM, yielding a sleep efficiency of 68.0%%. Sleep onset after lights out was 17.1 minutes with a REM latency of 100.0 minutes. The patient spent 12.7%% of the night in stage N1 sleep, 82.8%% in stage N2 sleep, 0.0%% in stage N3 and 4.5% in REM.  During the titration period of 271.3 minutes, the patient slept for 127.0 minutes in REM and nonREM, yielding a sleep efficiency of 46.8%%. Sleep onset after CPAP initiation was 18.5 minutes with a REM latency of 193.5 minutes. The patient spent 18.9%% of the night in stage N1 sleep, 61.4%% in stage N2 sleep, 0.0%% in stage N3 and 19.7% in REM.  CARDIAC DATA The 2 lead EKG demonstrated sinus rhythm. The mean heart rate was 100.0 beats per minute. Other EKG findings include: PVCs.  LEG MOVEMENT DATA The total Periodic Limb Movements of Sleep (PLMS) were 0. The PLMS index was 0.0 .  IMPRESSIONS - Moderate obstructive sleep apnea occurred during the diagnostic portion of the study (AHI 21.6/h: RDI 37.9/h); events were severe during REM sleep (AHI 43.6/h).  CPAP was started at 5 cm and was titrated up to  10 cm water. Central events were present at all pressures from 5 - 10 cm; AHI at 10 cm was 16.9. - Mild central sleep apnea occurred during the diagnostic portion of the study (CAI 2.0/hour). - Oxygen desaturation was noted during the diagnostic portion of the study (Min O2 88.0%). - The patient snored with moderate snoring volume during the diagnostic portion of the study. -  EKG findings include PVCs. - Clinically significant periodic limb movements did not occur during sleep.  DIAGNOSIS - Obstructive Sleep Apnea (327.23 [G47.33 ICD-10])  RECOMMENDATIONS - Due to central events and sub optimal CPAP result, recommend an expeditious in-lab BiPAP titration study or home BiPAP Auto trial with PS 4, EPAP min of 5 and IPAP max at 25 cm water.  A large size Resmed Full Face Mask AirFit F20 mask and heated humidification was used for the titration study. - Effort should be made to optimize nasal and oropharyngeal patency. - Avoid alcohol, sedatives and other CNS depressants that may worsen sleep apnea and disrupt normal sleep architecture. - Sleep hygiene should be reviewed to assess factors that may improve sleep quality. - Weight management and regular exercise should be initiated or continued. - Sleep clinic evaluation after download obtained after 4 weeks of therapy  [Electronically signed] 09/11/2018 10:39 AM  Shelva Majestic MD, [facility], ABSM Diplomate, American Board of Sleep Medicine   NPI: [id number] [place] PH: [phone]   FX: [phone] [place]

## 2018-09-12 ENCOUNTER — Other Ambulatory Visit: Payer: Self-pay | Admitting: Cardiovascular Disease

## 2018-09-12 ENCOUNTER — Telehealth: Payer: Self-pay | Admitting: *Deleted

## 2018-09-12 DIAGNOSIS — I2510 Atherosclerotic heart disease of native coronary artery without angina pectoris: Secondary | ICD-10-CM

## 2018-09-12 DIAGNOSIS — I10 Essential (primary) hypertension: Secondary | ICD-10-CM

## 2018-09-12 DIAGNOSIS — Z9861 Coronary angioplasty status: Secondary | ICD-10-CM

## 2018-09-12 DIAGNOSIS — G4733 Obstructive sleep apnea (adult) (pediatric): Secondary | ICD-10-CM

## 2018-09-12 DIAGNOSIS — R0681 Apnea, not elsewhere classified: Secondary | ICD-10-CM

## 2018-09-12 NOTE — Telephone Encounter (Signed)
Left message to return a call to discuss sleep study results and recommendations. 

## 2018-09-12 NOTE — Telephone Encounter (Signed)
-----   Message from Lennette Bihari, MD sent at 09/11/2018 10:48 AM EDT ----- Burna Mortimer,  Please notify pt;  Recommend BiPAP;  Schedule for expeditious biPAP titration study or if insurance allows, may try BiPAP Auto at home  And f/u sleep clinic

## 2018-09-13 NOTE — Telephone Encounter (Signed)
-----   Message from Thomas A Kelly, MD sent at 09/11/2018 10:48 AM EDT ----- Wanda,  Please notify pt;  Recommend BiPAP;  Schedule for expeditious biPAP titration study or if insurance allows, may try BiPAP Auto at home  And f/u sleep clinic 

## 2018-09-13 NOTE — Telephone Encounter (Signed)
Called patient and discussed sleep study results and recommendations. Patient states that he wants to talk with Dr Tanya Nones before proceeding with Dr Landry Dyke recommendations. I explained to the patient how important it is for him to proceed with the BIPAP titration study that has been scheduled for 10/17/18. He says that he will make that decision after he speaks with D [name]. I told him that  I will not cancel the appointment at this time. I will leave it because the sleep labs schedule is filling in.

## 2018-09-28 ENCOUNTER — Ambulatory Visit: Payer: Medicare Other | Admitting: Cardiovascular Disease

## 2018-09-28 ENCOUNTER — Encounter: Payer: Self-pay | Admitting: Cardiovascular Disease

## 2018-09-28 VITALS — BP 134/68 | HR 74 | Ht 67.0 in | Wt 285.0 lb

## 2018-09-28 DIAGNOSIS — I10 Essential (primary) hypertension: Secondary | ICD-10-CM | POA: Diagnosis not present

## 2018-09-28 DIAGNOSIS — I2510 Atherosclerotic heart disease of native coronary artery without angina pectoris: Secondary | ICD-10-CM | POA: Diagnosis not present

## 2018-09-28 DIAGNOSIS — Z9861 Coronary angioplasty status: Secondary | ICD-10-CM

## 2018-09-28 DIAGNOSIS — G4733 Obstructive sleep apnea (adult) (pediatric): Secondary | ICD-10-CM | POA: Diagnosis not present

## 2018-09-28 DIAGNOSIS — I440 Atrioventricular block, first degree: Secondary | ICD-10-CM

## 2018-09-28 NOTE — Patient Instructions (Addendum)
Medication Instructions:  Your physician recommends that you continue on your current medications as directed. Please refer to the Current Medication list given to you today.  If you need a refill on your cardiac medications before your next appointment, please call your pharmacy.   Lab work: NONE  Testing/Procedures: NONE  Follow-Up: At [facility], you and your health needs are our priority.  As part of our continuing mission to provide you with exceptional heart care, we have created designated Provider Care Teams.  These Care Teams include your primary Cardiologist (physician) and Advanced Practice Providers (APPs -  Physician Assistants and Nurse Practitioners) who all work together to provide you with the care you need, when you need it. You will need a follow up appointment in 2 months.  You may see Chilton Si, MD or one of the following Advanced Practice Providers on your designated Care Team:   Corine Shelter, PA-C Judy Pimple, [place] . Marjie Skiff, PA-C  Any Other Special Instructions Will Be Listed Below (If Applicable). WORK HARDER ON DIET AND EXERCISE    DASH Eating Plan DASH stands for "Dietary Approaches to Stop Hypertension." The DASH eating plan is a healthy eating plan that has been shown to reduce high blood pressure (hypertension). It may also reduce your risk for type 2 diabetes, heart disease, and stroke. The DASH eating plan may also help with weight loss. What are tips for following this plan? General guidelines  Avoid eating more than 2,300 mg (milligrams) of salt (sodium) a day. If you have hypertension, you may need to reduce your sodium intake to 1,500 mg a day.  Limit alcohol intake to no more than 1 drink a day for nonpregnant women and 2 drinks a day for men. One drink equals 12 oz of beer, 5 oz of wine, or 1 oz of hard liquor.  Work with your health care provider to maintain a healthy body weight or to lose weight. Ask what an ideal weight is  for you.  Get at least 30 minutes of exercise that causes your heart to beat faster (aerobic exercise) most days of the week. Activities may include walking, swimming, or biking.  Work with your health care provider or diet and nutrition specialist (dietitian) to adjust your eating plan to your individual calorie needs. Reading food labels  Check food labels for the amount of sodium per serving. Choose foods with less than 5 percent of the Daily Value of sodium. Generally, foods with less than 300 mg of sodium per serving fit into this eating plan.  To find whole grains, look for the word "whole" as the first word in the ingredient list. Shopping  Buy products labeled as "low-sodium" or "no salt added."  Buy fresh foods. Avoid canned foods and premade or frozen meals. Cooking  Avoid adding salt when cooking. Use salt-free seasonings or herbs instead of table salt or sea salt. Check with your health care provider or pharmacist before using salt substitutes.  Do not fry foods. Cook foods using healthy methods such as baking, boiling, grilling, and broiling instead.  Cook with heart-healthy oils, such as olive, canola, soybean, or sunflower oil. Meal planning   Eat a balanced diet that includes: ? 5 or more servings of fruits and vegetables each day. At each meal, try to fill half of your plate with fruits and vegetables. ? Up to 6-8 servings of whole grains each day. ? Less than 6 oz of lean meat, poultry, or fish each day. A  3-oz serving of meat is about the same size as a deck of cards. One egg equals 1 oz. ? 2 servings of low-fat dairy each day. ? A serving of nuts, seeds, or beans 5 times each week. ? Heart-healthy fats. Healthy fats called Omega-3 fatty acids are found in foods such as flaxseeds and coldwater fish, like sardines, salmon, and mackerel.  Limit how much you eat of the following: ? Canned or prepackaged foods. ? Food that is high in trans fat, such as fried  foods. ? Food that is high in saturated fat, such as fatty meat. ? Sweets, desserts, sugary drinks, and other foods with added sugar. ? Full-fat dairy products.  Do not salt foods before eating.  Try to eat at least 2 vegetarian meals each week.  Eat more home-cooked food and less restaurant, buffet, and fast food.  When eating at a restaurant, ask that your food be prepared with less salt or no salt, if possible. What foods are recommended? The items listed may not be a complete list. Talk with your dietitian about what dietary choices are best for you. Grains Whole-grain or whole-wheat bread. Whole-grain or whole-wheat pasta. Brown rice. Orpah Cobb. Bulgur. Whole-grain and low-sodium cereals. Pita bread. Low-fat, low-sodium crackers. Whole-wheat flour tortillas. Vegetables Fresh or frozen vegetables (raw, steamed, roasted, or grilled). Low-sodium or reduced-sodium tomato and vegetable juice. Low-sodium or reduced-sodium tomato sauce and tomato paste. Low-sodium or reduced-sodium canned vegetables. Fruits All fresh, dried, or frozen fruit. Canned fruit in natural juice (without added sugar). Meat and other protein foods Skinless chicken or [place]. Ground chicken or [place]. Pork with fat trimmed off. Fish and seafood. Egg whites. Dried beans, peas, or lentils. Unsalted nuts, nut butters, and seeds. Unsalted canned beans. Lean cuts of beef with fat trimmed off. Low-sodium, lean deli meat. Dairy Low-fat (1%) or fat-free (skim) milk. Fat-free, low-fat, or reduced-fat cheeses. Nonfat, low-sodium ricotta or cottage cheese. Low-fat or nonfat yogurt. Low-fat, low-sodium cheese. Fats and oils Soft margarine without trans fats. Vegetable oil. Low-fat, reduced-fat, or light mayonnaise and salad dressings (reduced-sodium). Canola, safflower, olive, soybean, and sunflower oils. Avocado. Seasoning and other foods Herbs. Spices. Seasoning mixes without salt. Unsalted popcorn and pretzels. Fat-free  sweets. What foods are not recommended? The items listed may not be a complete list. Talk with your dietitian about what dietary choices are best for you. Grains Baked goods made with fat, such as croissants, muffins, or some breads. Dry pasta or rice meal packs. Vegetables Creamed or fried vegetables. Vegetables in a cheese sauce. Regular canned vegetables (not low-sodium or reduced-sodium). Regular canned tomato sauce and paste (not low-sodium or reduced-sodium). Regular tomato and vegetable juice (not low-sodium or reduced-sodium). Rosita Fire. Olives. Fruits Canned fruit in a light or heavy syrup. Fried fruit. Fruit in cream or butter sauce. Meat and other protein foods Fatty cuts of meat. Ribs. Fried meat. Tomasa Blase. Sausage. Bologna and other processed lunch meats. Salami. Fatback. Hotdogs. Bratwurst. Salted nuts and seeds. Canned beans with added salt. Canned or smoked fish. Whole eggs or egg yolks. Chicken or [place] with skin. Dairy Whole or 2% milk, cream, and half-and-half. Whole or full-fat cream cheese. Whole-fat or sweetened yogurt. Full-fat cheese. Nondairy creamers. Whipped toppings. Processed cheese and cheese spreads. Fats and oils Butter. Stick margarine. Lard. Shortening. Ghee. Bacon fat. Tropical oils, such as coconut, palm kernel, or palm oil. Seasoning and other foods Salted popcorn and pretzels. Onion salt, garlic salt, seasoned salt, table salt, and sea salt. Worcestershire sauce. Tartar sauce.  Barbecue sauce. Teriyaki sauce. Soy sauce, including reduced-sodium. Steak sauce. Canned and packaged gravies. Fish sauce. Oyster sauce. Cocktail sauce. Horseradish that you find on the shelf. Ketchup. Mustard. Meat flavorings and tenderizers. Bouillon cubes. Hot sauce and Tabasco sauce. Premade or packaged marinades. Premade or packaged taco seasonings. Relishes. Regular salad dressings. Where to find more information:  National Heart, Lung, and Blood Institute:  PopSteam.is  American Heart Association: www.heart.org Summary  The DASH eating plan is a healthy eating plan that has been shown to reduce high blood pressure (hypertension). It may also reduce your risk for type 2 diabetes, heart disease, and stroke.  With the DASH eating plan, you should limit salt (sodium) intake to 2,300 mg a day. If you have hypertension, you may need to reduce your sodium intake to 1,500 mg a day.  When on the DASH eating plan, aim to eat more fresh fruits and vegetables, whole grains, lean proteins, low-fat dairy, and heart-healthy fats.  Work with your health care provider or diet and nutrition specialist (dietitian) to adjust your eating plan to your individual calorie needs. This information is not intended to replace advice given to you by your health care provider. Make sure you discuss any questions you have with your health care provider. Document Released: 10/29/2011 Document Revised: 11/02/2016 Document Reviewed: 11/02/2016 Elsevier Interactive Patient Education  [employer].

## 2018-09-28 NOTE — Progress Notes (Signed)
Cardiology Office Note   Date:  09/28/2018   ID:  Erik Hancock, DOB [birth year], MRN [id number]  PCP:  Donita Brooks, MD  Cardiologist:   Chilton Si, MD   No chief complaint on file.   History of Present Illness: Erik Hancock is a 67 y.o. male with CAD s/p PCI, OSA, diabetes, hypertension and hyperlipidemia here for follow-up.  He initially presented to establish care 07/2018.  At that appointment he reported increasing exertional dyspnea.  Symptoms were progressive over the preceding year.  He had no chest pain but did get discomfort in his left arm.  This was similar to when he previously required coronary stenting.  In 2012 Erik Hancock had angina and underwent PCI of the LAD.  He had no other CAD.   He had an echo 01/2014 that revealed LVEF 60-65% and an moderately dilated RV.  He was referred for left heart catheterization.  At that time he was found to have three-vessel CAD with stents in LAD, left circumflex, and PDA.  He was noted to have a 95% ostial LAD lesion that was successfully treated with scoring balloon angioplasty and implantation of an Orsiro 3.5x13 drug-eluting stent.  Time he has been feeling well.  He is not getting any regular exercise.  Some shortness of breath with exertion but it is much better than before.  He also notes that his diet has been poor his meals.  Limits salt when she cooks at home but they also eat out frequently.  Since his last appointment Erik Hancock had a sleep study that was positive.  It was felt that he would benefit more from a BiPAP and is scheduled for BiPAP titration later this month.  At the last appointment amlodipine was increased.  Been checking his blood pressure at home.  Past Medical History:  Diagnosis Date  . Arthritis    "all over" (07/29/2018)  . CAD S/P percutaneous coronary angioplasty    a. stent to mLAD 11/2001. b. DES to PDA/mLCx 2007. c. DES to ostial LAD 07/2018.  [name] Chronic lower back pain   . CKD (chronic kidney  disease), stage III (HCC)   . CKD (chronic kidney disease), stage III (HCC)   . Diabetic retinopathy (HCC)   . GERD (gastroesophageal reflux disease)   . High cholesterol   . History of kidney stones   . Hypertension   . Mobitz type 2 second degree atrioventricular block    a. noted during 07/2018 adm, metoprolol discontinued.  . Morbid obesity (HCC) 07/28/2018  . Prostatitis   . Pulmonary nodule 2013   CT  . Sleep apnea    stopbang=5  . Suspected sleep apnea   . Type II diabetes mellitus (HCC)     Past Surgical History:  Procedure Laterality Date  . BACK SURGERY    . CARDIAC CATHETERIZATION  JUNE 2003   PATENT LAD STENT/ BODERLINE OBSTRUCTIVE DISEASE POSTERIOR DESCENDING ARTERY/ NORMAL LVF  . CATARACT EXTRACTION W/ INTRAOCULAR LENS  IMPLANT, BILATERAL Bilateral 2017  . CERVICAL SCOVILLE FORAMINOTOMY W/ EXCISION OF HERNIATED NUCLEC PULPOSUS  2009   C6 - 7  . CORONARY ANGIOPLASTY WITH STENT PLACEMENT  03/30/2006   DR EDMUNDS - 90% LCx@OM2  TAXUS  DES 3.0 X 20 -> 3.25 mm,   95% RPDA -- TAXUS DES 3.0 X 16 --> 3.5 mm  . CORONARY ANGIOPLASTY WITH STENT PLACEMENT  11/2001   (Dr. Ty Hilts for Dr. Mayford Knife) - mLAD@D2  - TAXUS EXPRESS DES 3.5 x 15   .  CORONARY ANGIOPLASTY WITH STENT PLACEMENT  07/29/2018  . CORONARY STENT INTERVENTION N/A 07/29/2018   Procedure: CORONARY STENT INTERVENTION;  Surgeon: Marykay Lex, MD;  Location: [facility] INVASIVE CV LAB;  Service: Cardiovascular;  Laterality: N/A;  . CYSTOSCOPY W/ RETROGRADES  05/04/2012   Procedure: CYSTOSCOPY WITH RETROGRADE PYELOGRAM;  Surgeon: Milford Cage, MD;  Location: [facility];  Service: Urology;  Laterality: Left;  . CYSTOSCOPY W/ URETERAL STENT PLACEMENT  05/04/2012   Procedure: CYSTOSCOPY WITH STENT REPLACEMENT;  Surgeon: Milford Cage, MD;  Location: [facility];  Service: Urology;  Laterality: Left;  . CYSTOSCOPY WITH STENT PLACEMENT Left 04/04/2012  . CYSTOSCOPY WITH URETEROSCOPY  05/04/2012   Procedure: CYSTOSCOPY WITH  URETEROSCOPY;  Surgeon: Milford Cage, MD;  Location: [facility];  Service: Urology;  Laterality: Left;      . LEFT HEART CATH AND CORONARY ANGIOGRAPHY N/A 07/29/2018   Procedure: LEFT HEART CATH AND CORONARY ANGIOGRAPHY;  Surgeon: Marykay Lex, MD;  Location[facility] INVASIVE CV LAB;  Service: Cardiovascular;  Laterality: N/A;  . LEFT URETEROSCOPIC STONE EXTRACTION  03-14-2003   X2     Current Outpatient Medications  Medication Sig Dispense Refill  . ACCU-CHEK COMPACT PLUS test strip USE TO CHECK THREE TIMES A DAY 300 each 2  . amLODipine (NORVASC) 10 MG tablet Take 1 tablet (10 mg total) by mouth daily. 90 tablet 2  . aspirin 81 MG tablet Take 81 mg by mouth daily.    [name] atorvastatin (LIPITOR) 80 MG tablet Take 1 tablet (80 mg total) by mouth daily. 90 tablet 3  . glipiZIDE (GLUCOTROL) 10 MG tablet TAKE 1 TABLET BY MOUTH ONCE DAILY 90 tablet 1  . glucose blood (ACCU-CHEK AVIVA) test strip Check bs bid - tid 100 each 12  . Insulin Pen Needle 31G X 8 MM MISC Use daily with Victoza 100 each 3  . JARDIANCE 25 MG TABS tablet TAKE 1 TABLET BY MOUTH ONCE DAILY 90 tablet 0  . LANTUS SOLOSTAR 100 UNIT/ML Solostar Pen INJECT 32 UNITS UNDER THE SKIN EVERY MORNING (Patient taking differently: Inject 32 Units into the skin daily. INJECT 32 UNITS UNDER THE SKIN EVERY MORNIN) 30 mL 3  . liraglutide (VICTOZA) 18 MG/3ML SOPN Inject 0.2 mLs (1.2 mg total) into the skin daily. (Patient taking differently: Inject 1.2 mg into the skin every evening. ) 18 pen 2  . lisinopril (PRINIVIL,ZESTRIL) 20 MG tablet Take 1 tablet (20 mg total) by mouth daily. 30 tablet 6  . metFORMIN (GLUCOPHAGE) 1000 MG tablet TAKE 1 TABLET BY MOUTH TWICE DAILY 180 tablet 0  . nitroGLYCERIN (NITROSTAT) 0.4 MG SL tablet Place 1 tablet (0.4 mg total) under the tongue every 5 (five) minutes as needed for chest pain (up to 3 doses. If taking 3rd dose call 911). 25 tablet 3  . pantoprazole (PROTONIX) 40 MG tablet TAKE 1 TABLET BY MOUTH ONCE  DAILY 90 tablet 0  . ticagrelor (BRILINTA) 90 MG TABS tablet Take 1 tablet (90 mg total) by mouth 2 (two) times daily. 60 tablet 11   No current facility-administered medications for this visit.     Allergies:   Beta adrenergic blockers    Social History:  The patient  reports that he has never smoked. He has never used smokeless tobacco. He reports that he does not drink alcohol or use drugs.   Family History:  The patient's family history includes Alzheimer's disease in his father; Heart attack in his father and paternal grandfather; Heart disease in  his father and paternal grandfather; Hypertension in his mother; Lupus in his sister.    ROS:  Please see the history of present illness.   Otherwise, review of systems are positive for none.   All other systems are reviewed and negative.    PHYSICAL EXAM: VS:  BP 134/68   Pulse 74   Ht 5\' 7"  (1.702 m)   Wt 285 lb (129.3 kg)   BMI 44.64 kg/m  , BMI Body mass index is 44.64 kg/m. GENERAL:  Well appearing HEENT:  Pupils equal round and reactive, fundi not visualized, oral mucosa unremarkable NECK:  No jugular venous distention, waveform within normal limits, carotid upstroke brisk and symmetric, no bruits LUNGS:  Clear to auscultation bilaterally HEART:  RRR.  PMI not displaced or sustained,S1 and S2 within normal limits, no S3, no S4, no clicks, no rubs, no murmurs ABD:  Flat, positive bowel sounds normal in frequency in pitch, no bruits, no rebound, no guarding, no midline pulsatile mass, no hepatomegaly, no splenomegaly EXT:  2 plus pulses throughout, no edema, no cyanosis no clubbing SKIN:  No rashes no nodules NEURO:  Cranial nerves II through XII grossly intact, motor grossly intact throughout PSYCH:  Cognitively intact, oriented to person place and time   EKG:  EKG is not ordered today. The ekg ordered 07/19/18 demonstrates sinus rhythm at rate 60 bpm.  Prior inferior infarct.  Poor R wave progression.  LHC  04/08/11: PROCEDURES PERFORMED: 1. Percutaneous coronary intervention/stent implantation, mid LAD. 2. Percutaneous closure, right femoral artery/Perclose.  INDICATION: The patient is a 67 year old man with diabetes, hypertension, and hypercholesterolemia, who has developed exertional angina. Dr. Mayford Knife has completed coronary angiography revealing a mid LAD stenosis of 80-90% involving a small to moderate sized diagonal branch. He is to undergo percutaneous intervention at this time for definitive revascularization.  LHC 07/29/18:  Ost LAD lesion is 85% stenosed -just prior to previous stent. Previously placed prox LAD DES stent is 5% stenosed.  Following scoring balloon angioplasty, A drug-eluting stent was successfully placed overlapping the previous stent proximally, using a STENT ORSIRO 3.5X13 --postdilated to 4.0 mm  Post intervention, there is a 0% residual stenosis.  Previously placed Prox Cx to Dist Cx stent (DES) is widely patent. Ost 2nd Mrg lesion is 40% stenosed.  Ost Cx to Prox Cx lesion is 30% stenosed.  Previously placed RPDA-1 stent (DES) is widely patent. RPDA-2 lesion is 40% stenosed just beyond stent.  The left ventricular systolic function is normal. The left ventricular ejection fraction is 55-65% by visual estimate. LV end diastolic pressure is normal.  Post intervention, there is a 5% residual stenosis.  A drug-eluting stent was successfully placed using a STENT ORSIRO 3.5X13.   Three-vessel CAD with stents in the LAD, circumflex and PDA. Culprit lesion is severe 95% proximal edge lesion and ostial LAD -> successfully treated with scoring balloon angioplasty and DES stent overlapping the original stent (STENT ORSIRO 3.5X13 -postdilated to 4.0 mm) Patent stents in circumflex and RPDA Normal LV function with normal LVEDP.  Recent Labs: 07/30/2018: TSH 1.867 08/09/2018: ALT 35; BUN 17; Creat 1.21; Hemoglobin 15.2; Platelets 335; Potassium 4.8; Sodium 138     Lipid Panel    Component Value Date/Time   CHOL 129 07/19/2018 1203   TRIG 117 07/19/2018 1203   HDL 50 07/19/2018 1203   CHOLHDL 2.6 07/19/2018 1203   VLDL 29 05/10/2017 0832   LDLCALC 59 07/19/2018 1203      Wt Readings from Last 3 Encounters:  09/28/18 285 lb (129.3 kg)  08/22/18 280 lb (127 kg)  08/11/18 286 lb 9.6 oz (130 kg)      ASSESSMENT AND PLAN:  # Exertional dyspnea:  # CAD: # s/p LAD PCI: Erik Hancock is doing better post PCI.  We discussed the importance of working on his diet and exercise.  Continue aspirin, atorvastatin, and ticagrelor.  Beta-blocker was stopped in the hospital due to bradycardia.  # OSA:  Diagnosed on sleep study.  He is awaiting a BiPAP titration trial.  # Hypertension: BP is slightly above his goal of less than 130/80 on both initial and repeat.  However, he is very close.  We discussed that with diet and exercise he can lose some weights and may not need to add any medications.  However, if his blood pressure remains elevated at follow-up we will need to adjust his meds.  Continue amlodipine and lisinopril.  # Hyperlipidemia: LDL 59 on 06/2018.  Continue atorvastatin.  # DM:  Continue Jardiance. .  Current medicines are reviewed at length with the patient today.  The patient does not have concerns regarding medicines.  The following changes have been made:  no change  Labs/ tests ordered today include:  No orders of the defined types were placed in this encounter.    Disposition:   FU with Aniko Finnigan C. Duke Salvia, MD, [facility] in 2 months.      Signed, Nohemi Nicklaus C. Duke Salvia, MD, [facility]  09/28/2018 3:32 PM    [facility]

## 2018-10-17 ENCOUNTER — Encounter (HOSPITAL_BASED_OUTPATIENT_CLINIC_OR_DEPARTMENT_OTHER): Payer: Medicare Other

## 2018-10-17 ENCOUNTER — Other Ambulatory Visit: Payer: Self-pay | Admitting: Family Medicine

## 2018-10-17 ENCOUNTER — Encounter

## 2018-10-24 ENCOUNTER — Ambulatory Visit: Payer: Medicare Other | Admitting: Family Medicine

## 2018-10-24 ENCOUNTER — Encounter: Payer: Self-pay | Admitting: Family Medicine

## 2018-10-24 VITALS — BP 122/60 | HR 60 | Temp 97.8°F | Resp 18 | Ht 69.5 in | Wt 281.0 lb

## 2018-10-24 DIAGNOSIS — E7800 Pure hypercholesterolemia, unspecified: Secondary | ICD-10-CM

## 2018-10-24 DIAGNOSIS — IMO0002 Reserved for concepts with insufficient information to code with codable children: Secondary | ICD-10-CM

## 2018-10-24 DIAGNOSIS — I10 Essential (primary) hypertension: Secondary | ICD-10-CM | POA: Diagnosis not present

## 2018-10-24 DIAGNOSIS — E118 Type 2 diabetes mellitus with unspecified complications: Secondary | ICD-10-CM | POA: Diagnosis not present

## 2018-10-24 DIAGNOSIS — I2510 Atherosclerotic heart disease of native coronary artery without angina pectoris: Secondary | ICD-10-CM | POA: Diagnosis not present

## 2018-10-24 DIAGNOSIS — E1165 Type 2 diabetes mellitus with hyperglycemia: Secondary | ICD-10-CM

## 2018-10-24 DIAGNOSIS — Z9861 Coronary angioplasty status: Secondary | ICD-10-CM

## 2018-10-24 MED ORDER — INSULIN PEN NEEDLE 31G X 8 MM MISC: 3 refills | Status: DC

## 2018-10-24 MED ORDER — GLUCOSE BLOOD VI STRP: ORAL_STRIP | 2 refills | Status: DC

## 2018-10-24 MED ORDER — INSULIN PEN NEEDLE 32G X 6 MM MISC: 5 refills | Status: DC

## 2018-10-24 NOTE — Addendum Note (Signed)
Addended by: Legrand RamsWILLIS, Nettie Wyffels B on: 10/24/2018 10:42 AM   Modules accepted: Orders

## 2018-10-24 NOTE — Progress Notes (Signed)
Subjective:    Patient ID: Erik Hancock, male    Erik NewportOB: 1951/07/07, 67 y.o.   MRN: [id number]  Medication Refill    01/2017 Patient has not been seen in approximately a year. Overall he says he's been doing well. He admits that recently his sugars a been running higher as high as 200. He denies any polyuria or polydipsia. He does have blurry vision but he sees an eye specialist. He has been receiving injections in his eye due to neovascularization from diabetic retinopathy care of Dr. Ashley Hancock for the last several years. He is also recently had cataract surgery. Denies any chest pain shortness of breath or dyspnea on exertion. He does have a history of drug-eluting stents 3. He is still on dual antiplatelet therapy. However he has not seen a cardiologist in quite some time and therefore I believe that this is an oversight on my part area and I encouraged him to see his cardiologist for regular follow-up and he denies any myalgias or right upper quadrant pain.  At that time, my plan was: Blood pressure is controlled. I will check a hemoglobin A1c, a lipid panel, urine microalbumin, and a CMP. Goal LDL cholesterol is less than 70. Goal hemoglobin A1c is less than 7. Given his history of coronary artery disease, I would recommend adding Erik Hancock due to the reduction in cardiovascular mortality. I discussed this at length with the patient. He would be willing to consider this as long as his affordable. Strongly encouraged the patient to follow-up with his cardiologist. I believe he can likely stop the Erik Hancock as he is been on this medication for several years now. I would like him to continue aspirin  05/10/17 A1c was 8.4 at that time and we added Erik Hancock 25 mg poqday.  He is here for follow up.  He has lost 7 pounds since his last visit. However he admits that he has not changed his diet nor is he exercising. He does not have any sugar readings to compare. He does report 3 random sugars of 114, 144, and  180. Some are fasting. Some of those numbers are random. He does report increasing urine output since starting the new medication. He also reports trouble sleeping. His wife states that he snores at night and makes unusual gasping sounds and sleep. He reports hypersomnolence during the day. He falls asleep easily in a chair. He does not fall asleep driving. However he can easily fall asleep after eating a meal even in the middle of the day. He has been told in the past that he has sleep apnea after he recovered from anesthesia. However this is remote.  At that time, my plan was: I will schedule patient to me with a neurologist for a split-level sleep study to evaluate for obstructive sleep apnea. His hypersomnolence, his body habitus, and some of his other symptoms suggest a high likelihood of structure sleep apnea. His blood pressure today is well controlled at 110/68. He denies any chest pain shortness of breath or dyspnea on exertion. I will check a fasting lipid panel. His goal LDL cholesterol is less than 70 given his history of Erik Hancock. I will also check a hemoglobin A1c. Ideally I like his hemoglobin A1c less than 7. Strongly recommended he start exercising 30 minutes a day 5 days a week and restrict his calorie intake to try to achieve 20-30 pounds weight loss gradually over the next 6 months.  12/10/17 Erik Hancock at that time was elevated at 8.4.  Patient is here for follow up.  He stopped his Erik Hancock since I saw him.  Still on Erik Hancock, Erik Hancock 32 units a day, and Erik Hancock 1000 bid. The patient discontinue Erik Hancock due to polyuria.  He states his blood sugars were averaging between 120 and 145 however since discontinuation of the medication they are much higher. He is not watching his diet and he is not exercising. He is also overdue for a diabetic eye exam. He denies any neuropathy in his feet. He denies any blurry vision. He denies any chest pain shortness of breath or dyspnea on exertion. He denies any  myalgias or right upper quadrant pain. At that time, my plan was: Blood sugar today is adequately controlled. I will check a fasting lipid panel. Given his history of diabetes and coronary artery disease, his goal LDL cholesterol is less than 70. Given his history of retinopathy, I recommended he follow-up with his ophthalmologist as he has not seen his ophthalmologist at 16 months. I recommended checking his hemoglobin A1c. If greater than 8 as anticipated, I would recommend starting the patient on Erik Hancock.  Flu shot is up-to-date. Diabetic foot exam is performed today  01/18/18 Please see labs.  Hemoglobin A1c was 9.3.  Therefore we added Erik Hancock 1.2 mg daily and asked the patient to return in 1 month for recheck.  He brings in his sugars today and I am pleasantly surprised.  His fasting sugars in the morning are averaging between 70 and 147 with the vast majority 80-120.  His evening sugars are averaging 99-141 with the vast majority between 101 130.  These are outstanding.  He denies any nausea or vomiting.  His polyuria has resolved.  He is lost 5 pounds and strongly work to change his diet and is starting to exercise more.  At that time, my plan was: I am extremely proud of this patient.  His sugars have improved dramatically and he is losing weight.  Continue Erik Hancock 1.2 mg subcu daily and recheck hemoglobin A1c and fasting lab work in 3 months.  I congratulated him on his success thus far.  07/19/18 Patient has not followed up as planned in May.  Furthermore he is not regularly checking his sugars.  This morning his fasting sugar was in the 90s however he also reports occasional sugars in the 180s.  I question how often he is actually been checking his sugars.  However he is being compliant with big toes a.  He is gained a pound since his last visit.  However the reason he made his visit has been worsening dyspnea on exertion.  Symptoms began approximately 1 month ago.  He states that whenever he is  outside working, especially in the hot air, he has a very difficult time catching his breath.  It is starting to occur even with minimal activity.  Furthermore he will break into a sweat.  Occasionally he will develop sharp pains on the left side of his chest that will radiate into his left arm.  These go away with rest.  Pattern of increasing dyspnea on exertion and chest pain with activity radiating into the left arm is concerning for angina.  He denies any chest pain at rest.  He denies any nausea or vomiting however he is concerned that he feels extremely weak and tired and short of breath with minimal activity.  AT that time, my plan was: On exam today, there is no JVD.  There are no crackles on pulmonary exam.  There is  no pitting edema in his extremities.  He does not appear to be fluid overloaded.  His lungs are clear to auscultation bilaterally.  My concern is that the patient is demonstrating signs of angina with increasing dyspnea on exertion.  Therefore I believe he needs to see his cardiologist as soon as possible for a stress test and possibly an echocardiogram.  In the meantime, I will check his lab work to monitor his risk factors including a CBC, fasting lipid panel, CMP, and hemoglobin A1c.  His goal LDL will be less than 60.  His goal hemoglobin A1c would be less than 7.  I will also add Imdur 30 mg poqday to see if this helps with his dyspnea on exertion and exertional chest pain.  I have cautioned the patient to be extremely cautious until he sees his cardiologist and not to aggressively push his activity until after he has had a stress test.  He is already on dual antiplatelet agent, aspirin and Erik Hancock.  He is on Toprol-XL 25 mg a day and his heart rate is 60 bpm.  However I will discontinue simvastatin and replacing with Lipitor 80 mg a day for plaque stabilization as well as to optimize his statin.  EKG shows sinus rhythm with first-degree AV block.  There are Q waves in leads aVF and III.   Otherwise there are no ST-T segment changes.  08/09/18 Was admitted to the hospital.  I have copied relevant portions of the DC summary below for my reference: Admit date: 07/29/2018 Discharge date: 07/31/2018  Primary Care Provider: Donita Brooks, MD  Primary Cardiologist: Chilton Si, MD  Discharge Diagnoses    Principal Problem:   Angina, class II [facility]) Active Problems:   Diabetes mellitus (HCC)   Essential hypertension   Hyperlipidemia due to dietary fat intake   CAD S/P percutaneous coronary angioplasty   Morbid obesity (HCC)   Exertional dyspnea   Mobitz type 2 second degree atrioventricular block   CKD (chronic kidney disease), stage III (HCC)   Allergies      Allergies  Allergen Reactions  . Beta Adrenergic Blockers     Metoprolol stopped 07/2018 due to 2nd degree type 2 AV block.    Diagnostic Studies/Procedures    Cath 07/29/18 Conclusion     Ost LAD lesion is 85% stenosed -just prior to previous stent. Previously placed prox LAD DES stent is 5% stenosed.  Following scoring balloon angioplasty, A drug-eluting stent was successfully placed overlapping the previous stent proximally, using a STENT ORSIRO 3.5X13 --postdilated to 4.0 mm  Post intervention, there is a 0% residual stenosis.  Previously placed Prox Cx to Dist Cx stent (DES) is widely patent. Ost 2nd Mrg lesion is 40% stenosed.  Ost Cx to Prox Cx lesion is 30% stenosed.  Previously placed RPDA-1 stent (DES) is widely patent. RPDA-2 lesion is 40% stenosed just beyond stent.  The left ventricular systolic function is normal. The left ventricular ejection fraction is 55-65% by visual estimate. LV end diastolic pressure is normal.  Post intervention, there is a 5% residual stenosis.  A drug-eluting stent was successfully placed using a STENT ORSIRO 3.5X13.  Three-vessel CAD with stents in the LAD, circumflex and PDA. Culprit lesion is severe 95% proximal edge lesion and ostial LAD ->  successfully treated with scoring balloon angioplasty and DES stent overlapping the original stent (STENT ORSIRO 3.5X13 -postdilated to 4.0 mm) Patent stents in circumflex and RPDA Normal LV function with normal LVEDP.   As the patient  has an ostial LAD stent, I felt it more prudent to monitor the patient overnight as opposed to same-day discharge. He will be transferred to postprocedure unit for ongoing care.  Continue home medications with exception of his diabetes medications (will cover with sliding scale insulin)  Recommend dual antiplatelet therapy with Aspirin 81mg  daily and Ticagrelor 90mg  twice dailylong-term (beyond 12 months) because of Ostial LAD stent. Would be okay to stop aspirin after 3 to 6 months, but would continue Brilinta at 90 mg for 1 year and then reduced to 60 mg/convert to Erik Hancock 75 mg after 1 year.[name].  He will follow-up with Dr. Chilton Siiffany Spencer.   Bryan Lemmaavid Harding, M.D., M.S. Interventional Cardiologist       _____________   History of Present Illness     67 y.o.malewith CAD (PCI of LAD 2003, PDA/LCx in 2007), DM, CKD III by labs. HTN, HLD, arthritis, GERD, morbid obesity was recently seen in the office to establish care on 07/26/18 with symptoms concerning for unstable angina. Pre-cath labs showed continued renal insufficiency so it was suggested to hold lisinopril 2 days prior to cath. He was admitted 07/29/2018 for planned procedure.  Hospital Course     1. Unstable angina/CAD - cath showed 85% ostial LAD just prior to previous stent, treated with DES overlapping previous stent. Otherwise nonobstructive disease noted. EF 55-65%, LVEDP normal. Per Dr. Herbie BaltimoreHarding, "Recommend dual antiplatelet therapy with Aspirin 81mg  daily and Ticagrelor 90mg  twice dailylong-term (beyond 12 months) because of Ostial LAD stent. Would be okay to stop aspirin after 3 to 6 months, but would continue Brilinta at 90 mg for 1 year and then reduced to 60 mg/convert to Erik Hancock  75 mg after 1 year." Ultimate decision making will be up to Dr. Duke Salviaandolph about duration. Atorvastatin was continued (recent LDL 59). Metoprolol was stopped as below  2. Intermittent type 2 second degree AV block - this was initially noted the afternoon after cath. He was noted to have several episodes of Mobitz 2nd degree AV block type 2, with dropping up to 2 beats at a time. This was also noted while sleeping. He had taken his metoprolol at home prior to cath, but it was held once this was recognized. TSH was normal. He was totally asymptomatic. He wished to go home day after cath but it was recommended he stay for continued observation for one more night. After d/c of metoprolol, he only had episode overnight while sleeping. Dr. Ladona Ridgelaylor reviewed strips and case and felt no specific intervention was needed at this time and recommended watchful waiting for any symptoms to correlate. He did not feel event monitoring was necessary. Patient was advised to avoid HR-slowing medicines and call if any new symptoms. He denied h/o presyncope or syncope.  3. CKD stage III - Previously 1.24 in 11/2017. It was 1.57 prior to cath, and 1.45 post-cath. Dr. Duke Salviaandolph recommended to decrease lisinopril to 20mg  daily and add amlodipine 5mg  daily to accommodate BP control since BB was also stopped. He was advised to d/c NSAIDS. Lab order was entered for him to return Tuesday 9/10 to lab for BMET.  4. HTN -changes made as above. Likely also driven by #5.  5. Morbid obesity/suspected OSA - pending OP sleep study. Discussed role of weight in management.  6. DM - he was advised that he can resume Erik Hancock tomorrow.  The patient feels well this morning. Dr. Duke Salviaandolph has seen and examined the patient today and feels he is stable for discharge. He has a  f/u appointment in 2 months but also needs a post cath check within 2 weeks. I have sent a message to our office's scheduling team requesting a follow-up appointment, and  our office will call the patient with this information. His usual [facility] is closed today but he will need to pick up rx's for Brilinta and SL NTG today - per d/w pt/wife, will [facility]. He will get amlodipine/lisinopril prior to DC today so we will route these to [facility]. Also received handwritten 30day free Brilinta rx. Admit date: 07/29/2018 Discharge date: 07/31/2018  Primary Care Provider: Donita Brooks, MD  Primary Cardiologist: Chilton Si, MD  Discharge Diagnoses    Principal Problem:   Angina, class II [facility]) Active Problems:   Diabetes mellitus (HCC)   Essential hypertension   Hyperlipidemia due to dietary fat intake   CAD S/P percutaneous coronary angioplasty   Morbid obesity (HCC)   Exertional dyspnea   Mobitz type 2 second degree atrioventricular block   CKD (chronic kidney disease), stage III (HCC)   Allergies      Allergies  Allergen Reactions  . Beta Adrenergic Blockers     Metoprolol stopped 07/2018 due to 2nd degree type 2 AV block.    Diagnostic Studies/Procedures    Cath 07/29/18 Conclusion     Ost LAD lesion is 85% stenosed -just prior to previous stent. Previously placed prox LAD DES stent is 5% stenosed.  Following scoring balloon angioplasty, A drug-eluting stent was successfully placed overlapping the previous stent proximally, using a STENT ORSIRO 3.5X13 --postdilated to 4.0 mm  Post intervention, there is a 0% residual stenosis.  Previously placed Prox Cx to Dist Cx stent (DES) is widely patent. Ost 2nd Mrg lesion is 40% stenosed.  Ost Cx to Prox Cx lesion is 30% stenosed.  Previously placed RPDA-1 stent (DES) is widely patent. RPDA-2 lesion is 40% stenosed just beyond stent.  The left ventricular systolic function is normal. The left ventricular ejection fraction is 55-65% by visual estimate. LV end diastolic pressure is normal.  Post intervention, there is a 5% residual stenosis.  A drug-eluting  stent was successfully placed using a STENT ORSIRO 3.5X13.  Three-vessel CAD with stents in the LAD, circumflex and PDA. Culprit lesion is severe 95% proximal edge lesion and ostial LAD -> successfully treated with scoring balloon angioplasty and DES stent overlapping the original stent (STENT ORSIRO 3.5X13 -postdilated to 4.0 mm) Patent stents in circumflex and RPDA Normal LV function with normal LVEDP.   As the patient has an ostial LAD stent, I felt it more prudent to monitor the patient overnight as opposed to same-day discharge. He will be transferred to postprocedure unit for ongoing care.  Continue home medications with exception of his diabetes medications (will cover with sliding scale insulin)  Recommend dual antiplatelet therapy with Aspirin 81mg  daily and Ticagrelor 90mg  twice dailylong-term (beyond 12 months) because of Ostial LAD stent. Would be okay to stop aspirin after 3 to 6 months, but would continue Brilinta at 90 mg for 1 year and then reduced to 60 mg/convert to Erik Hancock 75 mg after 1 year.[name]  He will follow-up with Dr. Chilton Si.   Bryan Lemma, M.D., M.S. Interventional Cardiologist       _____________   History of Present Illness     67 y.o.malewith CAD (PCI of LAD 2003, PDA/LCx in 2007), DM, CKD III by labs. HTN, HLD, arthritis, GERD, morbid obesity was recently seen in the office to establish care  on 07/26/18 with symptoms concerning for unstable angina. Pre-cath labs showed continued renal insufficiency so it was suggested to hold lisinopril 2 days prior to cath. He was admitted 07/29/2018 for planned procedure.  Hospital Course     1. Unstable angina/CAD - cath showed 85% ostial LAD just prior to previous stent, treated with DES overlapping previous stent. Otherwise nonobstructive disease noted. EF 55-65%, LVEDP normal. Per Dr. Herbie Baltimore, "Recommend dual antiplatelet therapy with Aspirin 81mg  daily and Ticagrelor 90mg  twice dailylong-term  (beyond 12 months) because of Ostial LAD stent. Would be okay to stop aspirin after 3 to 6 months, but would continue Brilinta at 90 mg for 1 year and then reduced to 60 mg/convert to Erik Hancock 75 mg after 1 year." Ultimate decision making will be up to Dr. Duke Salvia about duration. Atorvastatin was continued (recent LDL 59). Metoprolol was stopped as below  2. Intermittent type 2 second degree AV block - this was initially noted the afternoon after cath. He was noted to have several episodes of Mobitz 2nd degree AV block type 2, with dropping up to 2 beats at a time. This was also noted while sleeping. He had taken his metoprolol at home prior to cath, but it was held once this was recognized. TSH was normal. He was totally asymptomatic. He wished to go home day after cath but it was recommended he stay for continued observation for one more night. After d/c of metoprolol, he only had episode overnight while sleeping. Dr. Ladona Ridgel reviewed strips and case and felt no specific intervention was needed at this time and recommended watchful waiting for any symptoms to correlate. He did not feel event monitoring was necessary. Patient was advised to avoid HR-slowing medicines and call if any new symptoms. He denied h/o presyncope or syncope.  3. CKD stage III - Previously 1.24 in 11/2017. It was 1.57 prior to cath, and 1.45 post-cath. Dr. Duke Salvia recommended to decrease lisinopril to 20mg  daily and add amlodipine 5mg  daily to accommodate BP control since BB was also stopped. He was advised to d/c NSAIDS. Lab order was entered for him to return Tuesday 9/10 to lab for BMET.  4. HTN -changes made as above. Likely also driven by #5.  5. Morbid obesity/suspected OSA - pending OP sleep study. Discussed role of weight in management.  6. DM - he was advised that he can resume Erik Hancock tomorrow.  The patient feels well this morning. Dr. Duke Salvia has seen and examined the patient today and feels he is stable for  discharge. He has a f/u appointment in 2 months but also needs a post cath check within 2 weeks. I have sent a message to our office's scheduling team requesting a follow-up appointment, and our office will call the patient with this information. His usual [facility] is closed today but he will need to pick up rx's for Brilinta and SL NTG today - per d/w pt/wife, will [facility]. He will get amlodipine/lisinopril prior to DC today so we will route these to [facility]. Also received handwritten 30day free Brilinta rx.  Doing very well.  Chest pain and left arm pain has resolved.  Still with mild sob with activity.  EF on cath was reportedly normal.  Has not had a CXR.  Denies cough, pleurisy.  Does have a history of pulmonary nodule on CT from 2013 but patient never went for follow up CT.  Recent Erik Hancock was 7.7.  He has tried to eat better and his  FBS is 99-120.  He is not checking postprandial sugars.  At that time, my plan was:  Obtain CXR given sob but I suspect dyspnea is due to deconditioning.  Already on dual antiplatelet agents, high intensity statin, beta blocker but glycemic control is suspect.  Recommend FBS and 2 hr PPS be recorded and reviewed in 2 weeks.  Recommended gradually increasing exercise to improve deconditioning.  Recommended 10-20 lbs of weight loss. Recheck fasting labs in 3 months.    10/24/18 Patient is not exercising regularly.  He is checking his blood sugars and his fasting blood sugars have been between 100 and and 140.  His 2-hour postprandial sugars have been between 120 and 160.  I am very happy about this however he stopped checking them approximately 6 weeks ago.  Therefore we have no data over the last month and a half to see how his sugars been doing.  His blood pressure today is well controlled at 122/60.  He denies any chest pain although he does report shortness of breath with exertion consistent with deconditioning.  He denies any orthopnea.  He  denies any paroxysmal nocturnal dyspnea.  He denies any nausea or vomiting or diarrhea. Wt Readings from Last 3 Encounters:  10/24/18 281 lb (127.5 kg)  09/28/18 285 lb (129.3 kg)  08/22/18 280 lb (127 kg)   He has not lost substantial weight since his last visit.  He is not monitoring his diet.  He denies any polyuria, polydipsia, or blurry vision.  He denies any numbness or tingling in his feet.  He denies any dysesthesias or paresthesias in his feet.  He denies any myalgias or right upper quadrant pain.  He denies any melena or hematochezia Past Medical History:  Diagnosis Date  . Arthritis    "all over" (07/29/2018)  . CAD S/P percutaneous coronary angioplasty    a. stent to mLAD 11/2001. b. DES to PDA/mLCx 2007. c. DES to ostial LAD 07/2018.  [name] Chronic lower back pain   . CKD (chronic kidney disease), stage III (HCC)   . CKD (chronic kidney disease), stage III (HCC)   . Diabetic retinopathy (HCC)   . GERD (gastroesophageal reflux disease)   . High cholesterol   . History of kidney stones   . Hypertension   . Mobitz type 2 second degree atrioventricular block    a. noted during 07/2018 adm, metoprolol discontinued.  . Morbid obesity (HCC) 07/28/2018  . Prostatitis   . Pulmonary nodule 2013   CT  . Sleep apnea    stopbang=5  . Suspected sleep apnea   . Type II diabetes mellitus (HCC)    Past Surgical History:  Procedure Laterality Date  . BACK SURGERY    . CARDIAC CATHETERIZATION  JUNE 2003   PATENT LAD STENT/ BODERLINE OBSTRUCTIVE DISEASE POSTERIOR DESCENDING ARTERY/ NORMAL LVF  . CATARACT EXTRACTION W/ INTRAOCULAR LENS  IMPLANT, BILATERAL Bilateral 2017  . CERVICAL SCOVILLE FORAMINOTOMY W/ EXCISION OF HERNIATED NUCLEC PULPOSUS  2009   C6 - 7  . CORONARY ANGIOPLASTY WITH STENT PLACEMENT  03/30/2006   DR EDMUNDS - 90% LCx@OM2  TAXUS  DES 3.0 X 20 -> 3.25 mm,   95% RPDA -- TAXUS DES 3.0 X 16 --> 3.5 mm  . CORONARY ANGIOPLASTY WITH STENT PLACEMENT  11/2001   (Dr. Ty Hilts for Dr.  Mayford Knife) - mLAD@D2  - TAXUS EXPRESS DES 3.5 x 15   . CORONARY ANGIOPLASTY WITH STENT PLACEMENT  07/29/2018  . CORONARY STENT INTERVENTION N/A 07/29/2018   Procedure:  CORONARY STENT INTERVENTION;  Surgeon: Marykay Lex, MD;  Location: [facility] INVASIVE CV LAB;  Service: Cardiovascular;  Laterality: N/A;  . CYSTOSCOPY W/ RETROGRADES  05/04/2012   Procedure: CYSTOSCOPY WITH RETROGRADE PYELOGRAM;  Surgeon: Milford Cage, MD;  Location: [facility] ORS;  Service: Urology;  Laterality: Left;  . CYSTOSCOPY W/ URETERAL STENT PLACEMENT  05/04/2012   Procedure: CYSTOSCOPY WITH STENT REPLACEMENT;  Surgeon: Milford Cage, MD;  Location: [facility];  Service: Urology;  Laterality: Left;  . CYSTOSCOPY WITH STENT PLACEMENT Left 04/04/2012  . CYSTOSCOPY WITH URETEROSCOPY  05/04/2012   Procedure: CYSTOSCOPY WITH URETEROSCOPY;  Surgeon: Milford Cage, MD;  Location: [facility];  Service: Urology;  Laterality: Left;      . LEFT HEART CATH AND CORONARY ANGIOGRAPHY N/A 07/29/2018   Procedure: LEFT HEART CATH AND CORONARY ANGIOGRAPHY;  Surgeon: Marykay Lex, MD;  Location: [facility] INVASIVE CV LAB;  Service: Cardiovascular;  Laterality: N/A;  . LEFT URETEROSCOPIC STONE EXTRACTION  03-14-2003   X2   Current Outpatient Medications on File Prior to Visit  Medication Sig Dispense Refill  . ACCU-CHEK COMPACT PLUS test strip USE TO CHECK THREE TIMES A DAY 300 each 2  . amLODipine (NORVASC) 10 MG tablet Take 1 tablet (10 mg total) by mouth daily. 90 tablet 2  . aspirin 81 MG tablet Take 81 mg by mouth daily.    [name] atorvastatin (LIPITOR) 80 MG tablet Take 1 tablet (80 mg total) by mouth daily. 90 tablet 3  . empagliflozin (Erik Hancock) 25 MG TABS tablet Take 25 mg by mouth daily. 90 tablet 0  . glipiZIDE (Erik Hancock) 10 MG tablet TAKE 1 TABLET BY MOUTH ONCE DAILY 90 tablet 1  . glucose blood (ACCU-CHEK AVIVA) test strip Check bs bid - tid 100 each 12  . Insulin Pen Needle 31G X 8 MM MISC Use daily with Erik Hancock 100 each 3  .  Erik Hancock SOLOSTAR 100 UNIT/ML Solostar Pen INJECT 32 UNITS UNDER THE SKIN EVERY MORNING (Patient taking differently: Inject 32 Units into the skin daily. INJECT 32 UNITS UNDER THE SKIN EVERY MORNIN) 30 mL 3  . liraglutide (Erik Hancock) 18 MG/3ML SOPN Inject 0.2 mLs (1.2 mg total) into the skin daily. (Patient taking differently: Inject 1.2 mg into the skin every evening. ) 18 pen 2  . lisinopril (PRINIVIL,ZESTRIL) 20 MG tablet Take 1 tablet (20 mg total) by mouth daily. 30 tablet 6  . Erik Hancock (GLUCOPHAGE) 1000 MG tablet Take 1 tablet (1,000 mg total) by mouth 2 (two) times daily with a meal. 180 tablet 0  . nitroGLYCERIN (NITROSTAT) 0.4 MG SL tablet Place 1 tablet (0.4 mg total) under the tongue every 5 (five) minutes as needed for chest pain (up to 3 doses. If taking 3rd dose call 911). 25 tablet 3  . pantoprazole (PROTONIX) 40 MG tablet TAKE 1 TABLET BY MOUTH ONCE DAILY 90 tablet 0  . ticagrelor (BRILINTA) 90 MG TABS tablet Take 1 tablet (90 mg total) by mouth 2 (two) times daily. 60 tablet 11   No current facility-administered medications on file prior to visit.    Allergies  Allergen Reactions  . Beta Adrenergic Blockers     Metoprolol stopped 07/2018 due to 2nd degree type 2 AV block.   Social History   Socioeconomic History  . Marital status: Married    Spouse name: Not on file  . Number of children: Not on file  . Years of education: Not on file  . Highest education level: Not on file  Occupational History  . Not on file  Social Needs  . Financial resource strain: Not on file  . Food insecurity:    Worry: Not on file    Inability: Not on file  . Transportation needs:    Medical: Not on file    Non-medical: Not on file  Tobacco Use  . Smoking status: Never Smoker  . Smokeless tobacco: Never Used  Substance and Sexual Activity  . Alcohol use: Never    Frequency: Never  . Drug use: Never  . Sexual activity: Not Currently  Lifestyle  . Physical activity:    Days per week: Not  on file    Minutes per session: Not on file  . Stress: Not on file  Relationships  . Social connections:    Talks on phone: Not on file    Gets together: Not on file    Attends religious service: Not on file    Active member of club or organization: Not on file    Attends meetings of clubs or organizations: Not on file    Relationship status: Not on file  . Intimate partner violence:    Fear of current or ex partner: Not on file    Emotionally abused: Not on file    Physically abused: Not on file    Forced sexual activity: Not on file  Other Topics Concern  . Not on file  Social History Narrative  . Not on file      Review of Systems  All other systems reviewed and are negative.      Objective:   Physical Exam  Constitutional: He appears well-developed and well-nourished.  Neck: Neck supple. No JVD present. No thyromegaly present.  Cardiovascular: Normal rate, regular rhythm, normal heart sounds and intact distal pulses.  No murmur heard. Pulmonary/Chest: Effort normal and breath sounds normal. No respiratory distress. He has no wheezes. He has no rales. He exhibits no tenderness.  Abdominal: Soft. Bowel sounds are normal. He exhibits no distension and no mass. There is no tenderness. There is no rebound and no guarding.  Musculoskeletal: He exhibits no edema.  Lymphadenopathy:    He has no cervical adenopathy.  Skin: No rash noted. No erythema.  Vitals reviewed.         Assessment & Plan:   Diabetes mellitus type 2, uncontrolled, with complications (HCC) - Plan: Hemoglobin A1c, COMPLETE METABOLIC PANEL WITH GFR, Lipid panel, Microalbumin, urine  CAD S/P percutaneous coronary angioplasty  Essential hypertension  Pure hypercholesterolemia  Blood pressure today is well controlled.  I emphasized to the patient that to prevent future problems with his heart, we need to control his risk factors aggressively.  His risk factors include his sedentary lifestyle, his  diet, his blood pressure, his diabetes, and his cholesterol.  I am very happy with his blood pressure.  His last blood sugars were excellent.  I will check a hemoglobin A1c.  Ideally I would like his hemoglobin A1c to be less than 7.  I will also check a fasting lipid panel.  Ideally his LDL cholesterol be less than 70.  He is currently taking aspirin and Brilinta.  Therefore I encourage the patient that the best place he can make positive change his in changing his lifestyle.  I encouraged 30 minutes of aerobic exercise 5 days a week.  I encouraged a diet low in carbohydrates and low in saturated fat.  I recommended 30 to 40 pounds weight loss gradually.  If he makes these changes,  I believe he can add quality years to his life and have an improved quality of life.  I would recheck the patient's fasting lab work in 6 months barring any unforeseen complications on this lab work

## 2018-10-25 LAB — COMPLETE METABOLIC PANEL WITH GFR
AG RATIO: 1.8 (calc) (ref 1.0–2.5)
ALT: 32 U/L (ref 9–46)
AST: 24 U/L (ref 10–35)
Albumin: 4.5 g/dL (ref 3.6–5.1)
Alkaline phosphatase (APISO): 102 U/L (ref 40–115)
BUN/Creatinine Ratio: 11 (calc) (ref 6–22)
BUN: 16 mg/dL (ref 7–25)
CALCIUM: 9.9 mg/dL (ref 8.6–10.3)
CO2: 23 mmol/L (ref 20–32)
CREATININE: 1.4 mg/dL — AB (ref 0.70–1.25)
Chloride: 103 mmol/L (ref 98–110)
GFR, EST NON AFRICAN AMERICAN: 52 mL/min/{1.73_m2} — AB (ref >=60)
GFR, Est African American: 60 mL/min/{1.73_m2} (ref >=60)
Globulin: 2.5 g/dL (calc) (ref 1.9–3.7)
Glucose, Bld: 221 mg/dL — ABNORMAL HIGH (ref 65–99)
Potassium: 4.7 mmol/L (ref 3.5–5.3)
SODIUM: 138 mmol/L (ref 135–146)
TOTAL PROTEIN: 7 g/dL (ref 6.1–8.1)
Total Bilirubin: 1 mg/dL (ref 0.2–1.2)

## 2018-10-25 LAB — LIPID PANEL
CHOL/HDL RATIO: 2 (calc) (ref <5.0)
CHOLESTEROL: 96 mg/dL (ref <200)
HDL: 48 mg/dL (ref >40)
LDL CHOLESTEROL (CALC): 31 mg/dL
NON-HDL CHOLESTEROL (CALC): 48 mg/dL (ref <130)
TRIGLYCERIDES: 87 mg/dL (ref <150)

## 2018-10-25 LAB — HEMOGLOBIN A1C
Hgb A1c MFr Bld: 8.3 % of total Hgb — ABNORMAL HIGH (ref <5.7)
MEAN PLASMA GLUCOSE: 192 (calc)
eAG (mmol/L): 10.6 (calc)

## 2018-10-25 LAB — MICROALBUMIN, URINE: Microalb, Ur: 0.6 mg/dL

## 2018-10-31 ENCOUNTER — Other Ambulatory Visit: Payer: Self-pay | Admitting: Family Medicine

## 2018-11-02 ENCOUNTER — Other Ambulatory Visit: Payer: Self-pay | Admitting: Family Medicine

## 2018-11-02 MED ORDER — INSULIN LISPRO (1 UNIT DIAL) 100 UNIT/ML (KWIKPEN): 5.0000 [IU] | PEN_INJECTOR | Freq: Two times a day (BID) | SUBCUTANEOUS | 2 refills | Status: DC

## 2018-11-02 MED ORDER — GLUCOSE BLOOD VI STRP: ORAL_STRIP | 12 refills | Status: DC

## 2018-11-02 MED ORDER — INSULIN ASPART 100 UNIT/ML FLEXPEN: 5.0000 [IU] | PEN_INJECTOR | Freq: Two times a day (BID) | SUBCUTANEOUS | 3 refills | Status: DC

## 2018-11-14 ENCOUNTER — Other Ambulatory Visit: Payer: Self-pay | Admitting: Family Medicine

## 2018-11-29 ENCOUNTER — Other Ambulatory Visit: Payer: Self-pay | Admitting: *Deleted

## 2018-11-29 MED ORDER — GLUCOSE BLOOD VI STRP: ORAL_STRIP | 12 refills | Status: DC

## 2018-12-01 ENCOUNTER — Telehealth: Payer: Self-pay | Admitting: *Deleted

## 2018-12-01 NOTE — Telephone Encounter (Signed)
Spoke with Erik Hancock, patient is needing a tooth extraction and is on brilinta. Advised patient had stent placement in sept 2019 and the recommendation is not to interrupt for the first 6 months unless emergent. She will let dr Katrinka Blazing know and they were made aware if patient has the procedure he will bleed more than normal.

## 2018-12-02 ENCOUNTER — Ambulatory Visit: Payer: Medicare Other | Admitting: Cardiovascular Disease

## 2018-12-10 ENCOUNTER — Encounter: Payer: Self-pay | Admitting: *Deleted

## 2018-12-27 ENCOUNTER — Other Ambulatory Visit: Payer: Self-pay | Admitting: Family Medicine

## 2019-01-02 ENCOUNTER — Other Ambulatory Visit: Payer: Self-pay | Admitting: Family Medicine

## 2019-01-18 ENCOUNTER — Encounter: Payer: Self-pay | Admitting: Cardiovascular Disease

## 2019-01-18 ENCOUNTER — Ambulatory Visit: Payer: Medicare Other | Admitting: Cardiovascular Disease

## 2019-01-18 ENCOUNTER — Encounter: Payer: Self-pay | Admitting: *Deleted

## 2019-01-18 VITALS — BP 128/60 | HR 77 | Ht 69.5 in | Wt 280.0 lb

## 2019-01-18 DIAGNOSIS — I2510 Atherosclerotic heart disease of native coronary artery without angina pectoris: Secondary | ICD-10-CM

## 2019-01-18 DIAGNOSIS — E7849 Other hyperlipidemia: Secondary | ICD-10-CM | POA: Diagnosis not present

## 2019-01-18 DIAGNOSIS — Z9861 Coronary angioplasty status: Secondary | ICD-10-CM

## 2019-01-18 DIAGNOSIS — I10 Essential (primary) hypertension: Secondary | ICD-10-CM | POA: Diagnosis not present

## 2019-01-18 NOTE — Progress Notes (Signed)
Cardiology Office Note   Date:  01/22/2019   ID:  Erik Hancock, DOB [birth year], MRN [id number]  PCP:  Donita Brooks, MD  Cardiologist:   Chilton Si, MD   Chief Complaint  Patient presents with  . Follow-up    2 months.    History of Present Illness: Erik Hancock is a 68 y.o. male with CAD s/p PCI, OSA, diabetes, hypertension and hyperlipidemia here for follow-up.  He initially presented to establish care 07/2018.  At that appointment he reported increasing exertional dyspnea.  Symptoms were progressive over the preceding year.  He had no chest pain but did get discomfort in his left arm.  This was similar to when he previously required coronary stenting.  In 2012 Erik Hancock had angina and underwent PCI of the LAD.  He had no other CAD.   He had an echo 01/2014 that revealed LVEF 60-65% and an moderately dilated RV.  He was referred for left heart catheterization.  At that time he was found to have three-vessel CAD with stents in LAD, left circumflex, and PDA.  He was noted to have a 95% ostial LAD lesion that was successfully treated with scoring balloon angioplasty and implantation of an Orsiro 3.5x13 drug-eluting stent.  Time he has been feeling well.  He is not getting any regular exercise.  Some shortness of breath with exertion but it is much better than before.  He also notes that his diet has been poor his meals.  Limits salt when she cooks at home but they also eat out frequently.  Since his last appointment Erik Hancock had a sleep study that was positive.  It was felt that he would benefit more from a BiPAP and is scheduled for BiPAP titration later this month.  At the last appointment amlodipine was increased.  He has been checking his blood pressure at home and it has been controlled.  He has been feeling generally well.  He did not go back for his BiPAP titration study.  He does machine.  He has some difficulty sleeping at night but sleeps well throughout the day.  He  denies any chest pain, shortness of breath, lower extremity edema, orthopnea, or PND.  He is not getting much exercise but does work in his yard and his shop.  He has no exertional symptoms.  His wife joined Erik Hancock so he has been losing weight because of the change in her cooking habits.   Past Medical History:  Diagnosis Date  . Arthritis    "all over" (07/29/2018)  . CAD S/P percutaneous coronary angioplasty    a. stent to mLAD 11/2001. b. DES to PDA/mLCx 2007. c. DES to ostial LAD 07/2018.  [name] Chronic lower back pain   . CKD (chronic kidney disease), stage III (HCC)   . CKD (chronic kidney disease), stage III (HCC)   . Diabetic retinopathy (HCC)   . GERD (gastroesophageal reflux disease)   . High cholesterol   . History of kidney stones   . Hypertension   . Mobitz type 2 second degree atrioventricular block    a. noted during 07/2018 adm, metoprolol discontinued.  . Morbid obesity (HCC) 07/28/2018  . Prostatitis   . Pulmonary nodule 2013   CT  . Sleep apnea    stopbang=5  . Suspected sleep apnea   . Type II diabetes mellitus (HCC)     Past Surgical History:  Procedure Laterality Date  . BACK SURGERY    .  CARDIAC CATHETERIZATION  JUNE 2003   PATENT LAD STENT/ BODERLINE OBSTRUCTIVE DISEASE POSTERIOR DESCENDING ARTERY/ NORMAL LVF  . CATARACT EXTRACTION W/ INTRAOCULAR LENS  IMPLANT, BILATERAL Bilateral 2017  . CERVICAL SCOVILLE FORAMINOTOMY W/ EXCISION OF HERNIATED NUCLEC PULPOSUS  2009   C6 - 7  . CORONARY ANGIOPLASTY WITH STENT PLACEMENT  03/30/2006   DR EDMUNDS - 90% LCx@OM2  TAXUS  DES 3.0 X 20 -> 3.25 mm,   95% RPDA -- TAXUS DES 3.0 X 16 --> 3.5 mm  . CORONARY ANGIOPLASTY WITH STENT PLACEMENT  11/2001   (Dr. Ty Hilts for Dr. Mayford Knife) - mLAD@D2  - TAXUS EXPRESS DES 3.5 x 15   . CORONARY ANGIOPLASTY WITH STENT PLACEMENT  07/29/2018  . CORONARY STENT INTERVENTION N/A 07/29/2018   Procedure: CORONARY STENT INTERVENTION;  Surgeon: Marykay Lex, MD;  Location: [facility] CV  LAB;  Service: Cardiovascular;  Laterality: N/A;  . CYSTOSCOPY W/ RETROGRADES  05/04/2012   Procedure: CYSTOSCOPY WITH RETROGRADE PYELOGRAM;  Surgeon: Milford Cage, MD;  Location: [facility];  Service: Urology;  Laterality: Left;  . CYSTOSCOPY W/ URETERAL STENT PLACEMENT  05/04/2012   Procedure: CYSTOSCOPY WITH STENT REPLACEMENT;  Surgeon: Milford Cage, MD;  Location: [facility];  Service: Urology;  Laterality: Left;  . CYSTOSCOPY WITH STENT PLACEMENT Left 04/04/2012  . CYSTOSCOPY WITH URETEROSCOPY  05/04/2012   Procedure: CYSTOSCOPY WITH URETEROSCOPY;  Surgeon: Milford Cage, MD;  Location: [facility];  Service: Urology;  Laterality: Left;      . LEFT HEART CATH AND CORONARY ANGIOGRAPHY N/A 07/29/2018   Procedure: LEFT HEART CATH AND CORONARY ANGIOGRAPHY;  Surgeon: Marykay Lex, MD;  Location: [facility] INVASIVE CV LAB;  Service: Cardiovascular;  Laterality: N/A;  . LEFT URETEROSCOPIC STONE EXTRACTION  03-14-2003   X2     Current Outpatient Medications  Medication Sig Dispense Refill  . amLODipine (NORVASC) 10 MG tablet Take 1 tablet (10 mg total) by mouth daily. 90 tablet 2  . aspirin 81 MG tablet Take 81 mg by mouth daily.    [name] atorvastatin (LIPITOR) 80 MG tablet Take 1 tablet (80 mg total) by mouth daily. 90 tablet 3  . BD ULTRA-FINE PEN NEEDLES 29G X 12.7MM MISC USE DAILY WITH VICTOZA 100 each 3  . empagliflozin (JARDIANCE) 25 MG TABS tablet TAKE 1 TABLET BY MOUTH ONCE DAILY 90 tablet 0  . glipiZIDE (GLUCOTROL) 10 MG tablet TAKE 1 TABLET BY MOUTH ONCE DAILY 90 tablet 1  . glucose blood (ACCU-CHEK AVIVA PLUS) test strip Use as instructed to monitor FSBS 4x daily due to fluctuating blood glucose levels. Dx: E11.65. 100 each 12  . insulin lispro (HUMALOG KWIKPEN) 100 UNIT/ML KwikPen Inject 0.05 mLs (5 Units total) into the skin 2 (two) times daily. With lunch and supper 3 mL 2  . Insulin Pen Needle (SURE COMFORT PEN NEEDLES) 32G X 6 MM MISC Use daily with Victoza 100 each 5  .  LANTUS SOLOSTAR 100 UNIT/ML Solostar Pen INJECT 32 UNITS UNDER THE SKIN EVERY MORNING (Patient taking differently: Inject 32 Units into the skin daily. INJECT 32 UNITS UNDER THE SKIN EVERY MORNIN) 30 mL 3  . lisinopril (PRINIVIL,ZESTRIL) 20 MG tablet Take 1 tablet (20 mg total) by mouth daily. 30 tablet 6  . metFORMIN (GLUCOPHAGE) 1000 MG tablet TAKE 1 TABLET BY MOUTH TWICE DAILY WITH A MEAL 180 tablet 0  . nitroGLYCERIN (NITROSTAT) 0.4 MG SL tablet Place 1 tablet (0.4 mg total) under the tongue every 5 (five) minutes as needed for chest  pain (up to 3 doses. If taking 3rd dose call 911). 25 tablet 3  . pantoprazole (PROTONIX) 40 MG tablet TAKE 1 TABLET BY MOUTH ONCE DAILY 90 tablet 0  . ticagrelor (BRILINTA) 90 MG TABS tablet Take 1 tablet (90 mg total) by mouth 2 (two) times daily. 60 tablet 11  . VICTOZA 18 MG/3ML SOPN INJECT 0.2 MLS (1.2 MG TOTAL) INTO THE SKIN DAILY 18 mL 0   No current facility-administered medications for this visit.     Allergies:   Beta adrenergic blockers    Social History:  The patient  reports that he has never smoked. He has never used smokeless tobacco. He reports that he does not drink alcohol or use drugs.   Family History:  The patient's family history includes Alzheimer's disease in his father; Heart attack in his father and paternal grandfather; Heart disease in his father and paternal grandfather; Hypertension in his mother; Lupus in his sister.    ROS:  Please see the history of present illness.   Otherwise, review of systems are positive for none.   All other systems are reviewed and negative.    PHYSICAL EXAM: VS:  BP 128/60   Pulse 77   Ht 5' 9.5" (1.765 m)   Wt 280 lb (127 kg)   BMI 40.76 kg/m  , BMI Body mass index is 40.76 kg/m. GENERAL:  Well appearing HEENT: Pupils equal round and reactive, fundi not visualized, oral mucosa unremarkable NECK:  No jugular venous distention, waveform within normal limits, carotid upstroke brisk and symmetric,  no bruits LUNGS:  Clear to auscultation bilaterally HEART:  RRR.  PMI not displaced or sustained,S1 and S2 within normal limits, no S3, no S4, no clicks, no rubs, no murmurs ABD:  Flat, positive bowel sounds normal in frequency in pitch, no bruits, no rebound, no guarding, no midline pulsatile mass, no hepatomegaly, no splenomegaly EXT:  2 plus pulses throughout, no edema, no cyanosis no clubbing SKIN:  No rashes no nodules NEURO:  Cranial nerves II through XII grossly intact, motor grossly intact throughout PSYCH:  Cognitively intact, oriented to person place and time   EKG:  EKG is not ordered today. The ekg ordered 07/19/18 demonstrates sinus rhythm at rate 60 bpm.  Prior inferior infarct.  Poor R wave progression.  LHC 04/08/11: PROCEDURES PERFORMED: 1. Percutaneous coronary intervention/stent implantation, mid LAD. 2. Percutaneous closure, right femoral artery/Perclose.  INDICATION: The patient is a 68 year old man with diabetes, hypertension, and hypercholesterolemia, who has developed exertional angina. Dr. Mayford Knifeurner has completed coronary angiography revealing a mid LAD stenosis of 80-90% involving a small to moderate sized diagonal branch. He is to undergo percutaneous intervention at this time for definitive revascularization.  LHC 07/29/18:  Ost LAD lesion is 85% stenosed -just prior to previous stent. Previously placed prox LAD DES stent is 5% stenosed.  Following scoring balloon angioplasty, A drug-eluting stent was successfully placed overlapping the previous stent proximally, using a STENT ORSIRO 3.5X13 --postdilated to 4.0 mm  Post intervention, there is a 0% residual stenosis.  Previously placed Prox Cx to Dist Cx stent (DES) is widely patent. Ost 2nd Mrg lesion is 40% stenosed.  Ost Cx to Prox Cx lesion is 30% stenosed.  Previously placed RPDA-1 stent (DES) is widely patent. RPDA-2 lesion is 40% stenosed just beyond stent.  The left ventricular systolic function is  normal. The left ventricular ejection fraction is 55-65% by visual estimate. LV end diastolic pressure is normal.  Post intervention, there is a 5%  residual stenosis.  A drug-eluting stent was successfully placed using a STENT ORSIRO 3.5X13.   Three-vessel CAD with stents in the LAD, circumflex and PDA. Culprit lesion is severe 95% proximal edge lesion and ostial LAD -> successfully treated with scoring balloon angioplasty and DES stent overlapping the original stent (STENT ORSIRO 3.5X13 -postdilated to 4.0 mm) Patent stents in circumflex and RPDA Normal LV function with normal LVEDP.  Recent Labs: 07/30/2018: TSH 1.867 08/09/2018: Hemoglobin 15.2; Platelets 335 10/24/2018: ALT 32; BUN 16; Creat 1.40; Potassium 4.7; Sodium 138    Lipid Panel    Component Value Date/Time   CHOL 96 10/24/2018 0923   TRIG 87 10/24/2018 0923   HDL 48 10/24/2018 0923   CHOLHDL 2.0 10/24/2018 0923   VLDL 29 05/10/2017 0832   LDLCALC 31 10/24/2018 0923      Wt Readings from Last 3 Encounters:  01/18/19 280 lb (127 kg)  10/24/18 281 lb (127.5 kg)  09/28/18 285 lb (129.3 kg)      ASSESSMENT AND PLAN:  # Exertional dyspnea:  # CAD: # s/p LAD PCI: Erik Hancock is doing better post PCI.  We discussed the importance of working on his diet and exercise.  Continue aspirin, atorvastatin, and ticagrelor.  Beta-blocker was stopped in the hospital due to bradycardia.  # OSA:  Diagnosed on sleep study.  He did not go back for the Bipap titration because he doesn't want to buy the machine.    # Hypertension: BP controlled.  Continue amlodipine and lisinopril.  # Hyperlipidemia: LDL 31 on 10/2018.  Continue atorvastatin.  # DM:  Continue Jardiance. .  Current medicines are reviewed at length with the patient today.  The patient does not have concerns regarding medicines.  The following changes have been made:  no change  Labs/ tests ordered today include:  No orders of the defined types were placed  in this encounter.    Disposition:   FU with Milicent Acheampong C. Duke Salvia, MD, [facility] in 2 months.      Signed, Avalynne Diver C. Duke Salvia, MD, [facility]  01/22/2019 9:37 AM    [facility]

## 2019-01-18 NOTE — Patient Instructions (Signed)
Medication Instructions:  Your physician recommends that you continue on your current medications as directed. Please refer to the Current Medication list given to you today.  If you need a refill on your cardiac medications before your next appointment, please call your pharmacy.   Lab work: NONE  Testing/Procedures: NONE  Follow-Up: At [facility], you and your health needs are our priority.  As part of our continuing mission to provide you with exceptional heart care, we have created designated Provider Care Teams.  These Care Teams include your primary Cardiologist (physician) and Advanced Practice Providers (APPs -  Physician Assistants and Nurse Practitioners) who all work together to provide you with the care you need, when you need it. You will need a follow up appointment in 12 months.  Please call our office 2 months in advance to schedule this appointment.  You may see Chilton Si, MD or one of the following Advanced Practice Providers on your designated Care Team:   Corine Shelter, PA-C Judy Pimple, [place] . Marjie Skiff, PA-C  Any Other Special Instructions Will Be Listed Below (If Applicable). YOU ARE OK TO HAVE YOUR DENTAL EXTRACTION WITHOUT HOLDING YOUR MEDICATIONS

## 2019-01-22 ENCOUNTER — Encounter: Payer: Self-pay | Admitting: Cardiovascular Disease

## 2019-01-23 ENCOUNTER — Ambulatory Visit: Payer: Medicare Other | Admitting: Family Medicine

## 2019-01-23 ENCOUNTER — Encounter: Payer: Self-pay | Admitting: Family Medicine

## 2019-01-23 VITALS — BP 108/66 | HR 68 | Temp 97.9°F | Resp 14 | Ht 69.5 in | Wt 280.0 lb

## 2019-01-23 DIAGNOSIS — I10 Essential (primary) hypertension: Secondary | ICD-10-CM | POA: Diagnosis not present

## 2019-01-23 DIAGNOSIS — IMO0002 Reserved for concepts with insufficient information to code with codable children: Secondary | ICD-10-CM

## 2019-01-23 DIAGNOSIS — E7800 Pure hypercholesterolemia, unspecified: Secondary | ICD-10-CM

## 2019-01-23 DIAGNOSIS — E118 Type 2 diabetes mellitus with unspecified complications: Secondary | ICD-10-CM | POA: Diagnosis not present

## 2019-01-23 DIAGNOSIS — I2510 Atherosclerotic heart disease of native coronary artery without angina pectoris: Secondary | ICD-10-CM | POA: Diagnosis not present

## 2019-01-23 DIAGNOSIS — E1165 Type 2 diabetes mellitus with hyperglycemia: Secondary | ICD-10-CM

## 2019-01-23 DIAGNOSIS — Z9861 Coronary angioplasty status: Secondary | ICD-10-CM

## 2019-01-23 MED ORDER — GABAPENTIN 300 MG PO CAPS: 300.0000 mg | ORAL_CAPSULE | Freq: Three times a day (TID) | ORAL | 3 refills | Status: DC

## 2019-01-23 NOTE — Progress Notes (Signed)
Subjective:    Patient ID: Erik Hancock, male    DOB: Mar 27, 1951, 68 y.o.   MRN: [id number]  Medication Refill    01/2017 Patient has not been seen in approximately a year. Overall he says he's been doing well. He admits that recently his sugars a been running higher as high as 200. He denies any polyuria or polydipsia. He does have blurry vision but he sees an eye specialist. He has been receiving injections in his eye due to neovascularization from diabetic retinopathy care of Dr. Ashley Royalty for the last several years. He is also recently had cataract surgery. Denies any chest pain shortness of breath or dyspnea on exertion. He does have a history of drug-eluting stents 3. He is still on dual antiplatelet therapy. However he has not seen a cardiologist in quite some time and therefore I believe that this is an oversight on my part area and I encouraged him to see his cardiologist for regular follow-up and he denies any myalgias or right upper quadrant pain.  At that time, my plan was: Blood pressure is controlled. I will check a hemoglobin A1c, a lipid panel, urine microalbumin, and a CMP. Goal LDL cholesterol is less than 70. Goal hemoglobin A1c is less than 7. Given his history of coronary artery disease, I would recommend adding jardiance due to the reduction in cardiovascular mortality. I discussed this at length with the patient. He would be willing to consider this as long as his affordable. Strongly encouraged the patient to follow-up with his cardiologist. I believe he can likely stop the Plavix as he is been on this medication for several years now. I would like him to continue aspirin  05/10/17 A1c was 8.4 at that time and we added jardiance 25 mg poqday.  He is here for follow up.  He has lost 7 pounds since his last visit. However he admits that he has not changed his diet nor is he exercising. He does not have any sugar readings to compare. He does report 3 random sugars of 114, 144, and  180. Some are fasting. Some of those numbers are random. He does report increasing urine output since starting the new medication. He also reports trouble sleeping. His wife states that he snores at night and makes unusual gasping sounds and sleep. He reports hypersomnolence during the day. He falls asleep easily in a chair. He does not fall asleep driving. However he can easily fall asleep after eating a meal even in the middle of the day. He has been told in the past that he has sleep apnea after he recovered from anesthesia. However this is remote.  At that time, my plan was: I will schedule patient to me with a neurologist for a split-level sleep study to evaluate for obstructive sleep apnea. His hypersomnolence, his body habitus, and some of his other symptoms suggest a high likelihood of structure sleep apnea. His blood pressure today is well controlled at 110/68. He denies any chest pain shortness of breath or dyspnea on exertion. I will check a fasting lipid panel. His goal LDL cholesterol is less than 70 given his history of ASCVD. I will also check a hemoglobin A1c. Ideally I like his hemoglobin A1c less than 7. Strongly recommended he start exercising 30 minutes a day 5 days a week and restrict his calorie intake to try to achieve 20-30 pounds weight loss gradually over the next 6 months.  12/10/17 HgA1c at that time was elevated at 8.4.  Patient is here for follow up.  He stopped his jardiance since I saw him.  Still on glucotrol, lantus 32 units a day, and metformin 1000 bid. The patient discontinue Jardiance due to polyuria.  He states his blood sugars were averaging between 120 and 145 however since discontinuation of the medication they are much higher. He is not watching his diet and he is not exercising. He is also overdue for a diabetic eye exam. He denies any neuropathy in his feet. He denies any blurry vision. He denies any chest pain shortness of breath or dyspnea on exertion. He denies any  myalgias or right upper quadrant pain. At that time, my plan was: Blood sugar today is adequately controlled. I will check a fasting lipid panel. Given his history of diabetes and coronary artery disease, his goal LDL cholesterol is less than 70. Given his history of retinopathy, I recommended he follow-up with his ophthalmologist as he has not seen his ophthalmologist at 16 months. I recommended checking his hemoglobin A1c. If greater than 8 as anticipated, I would recommend starting the patient on Victoza.  Flu shot is up-to-date. Diabetic foot exam is performed today  01/18/18 Please see labs.  Hemoglobin A1c was 9.3.  Therefore we added Victoza 1.2 mg daily and asked the patient to return in 1 month for recheck.  He brings in his sugars today and I am pleasantly surprised.  His fasting sugars in the morning are averaging between 70 and 147 with the vast majority 80-120.  His evening sugars are averaging 99-141 with the vast majority between 101 130.  These are outstanding.  He denies any nausea or vomiting.  His polyuria has resolved.  He is lost 5 pounds and strongly work to change his diet and is starting to exercise more.  At that time, my plan was: I am extremely proud of this patient.  His sugars have improved dramatically and he is losing weight.  Continue Victoza 1.2 mg subcu daily and recheck hemoglobin A1c and fasting lab work in 3 months.  I congratulated him on his success thus far.  07/19/18 Patient has not followed up as planned in May.  Furthermore he is not regularly checking his sugars.  This morning his fasting sugar was in the 90s however he also reports occasional sugars in the 180s.  I question how often he is actually been checking his sugars.  However he is being compliant with big toes a.  He is gained a pound since his last visit.  However the reason he made his visit has been worsening dyspnea on exertion.  Symptoms began approximately 1 month ago.  He states that whenever he is  outside working, especially in the hot air, he has a very difficult time catching his breath.  It is starting to occur even with minimal activity.  Furthermore he will break into a sweat.  Occasionally he will develop sharp pains on the left side of his chest that will radiate into his left arm.  These go away with rest.  Pattern of increasing dyspnea on exertion and chest pain with activity radiating into the left arm is concerning for angina.  He denies any chest pain at rest.  He denies any nausea or vomiting however he is concerned that he feels extremely weak and tired and short of breath with minimal activity.  AT that time, my plan was: On exam today, there is no JVD.  There are no crackles on pulmonary exam.  There is  no pitting edema in his extremities.  He does not appear to be fluid overloaded.  His lungs are clear to auscultation bilaterally.  My concern is that the patient is demonstrating signs of angina with increasing dyspnea on exertion.  Therefore I believe he needs to see his cardiologist as soon as possible for a stress test and possibly an echocardiogram.  In the meantime, I will check his lab work to monitor his risk factors including a CBC, fasting lipid panel, CMP, and hemoglobin A1c.  His goal LDL will be less than 60.  His goal hemoglobin A1c would be less than 7.  I will also add Imdur 30 mg poqday to see if this helps with his dyspnea on exertion and exertional chest pain.  I have cautioned the patient to be extremely cautious until he sees his cardiologist and not to aggressively push his activity until after he has had a stress test.  He is already on dual antiplatelet agent, aspirin and Plavix.  He is on Toprol-XL 25 mg a day and his heart rate is 60 bpm.  However I will discontinue simvastatin and replacing with Lipitor 80 mg a day for plaque stabilization as well as to optimize his statin.  EKG shows sinus rhythm with first-degree AV block.  There are Q waves in leads aVF and III.   Otherwise there are no ST-T segment changes.  08/09/18 Was admitted to the hospital.  I have copied relevant portions of the DC summary below for my reference: Admit date: 07/29/2018 Discharge date: 07/31/2018  Primary Care Provider: Donita Brooks, MD  Primary Cardiologist: Chilton Si, MD  Discharge Diagnoses    Principal Problem:   Angina, class II [facility]) Active Problems:   Diabetes mellitus (HCC)   Essential hypertension   Hyperlipidemia due to dietary fat intake   CAD S/P percutaneous coronary angioplasty   Morbid obesity (HCC)   Exertional dyspnea   Mobitz type 2 second degree atrioventricular block   CKD (chronic kidney disease), stage III (HCC)   Allergies      Allergies  Allergen Reactions  . Beta Adrenergic Blockers     Metoprolol stopped 07/2018 due to 2nd degree type 2 AV block.    Diagnostic Studies/Procedures    Cath 07/29/18 Conclusion     Ost LAD lesion is 85% stenosed -just prior to previous stent. Previously placed prox LAD DES stent is 5% stenosed.  Following scoring balloon angioplasty, A drug-eluting stent was successfully placed overlapping the previous stent proximally, using a STENT ORSIRO 3.5X13 --postdilated to 4.0 mm  Post intervention, there is a 0% residual stenosis.  Previously placed Prox Cx to Dist Cx stent (DES) is widely patent. Ost 2nd Mrg lesion is 40% stenosed.  Ost Cx to Prox Cx lesion is 30% stenosed.  Previously placed RPDA-1 stent (DES) is widely patent. RPDA-2 lesion is 40% stenosed just beyond stent.  The left ventricular systolic function is normal. The left ventricular ejection fraction is 55-65% by visual estimate. LV end diastolic pressure is normal.  Post intervention, there is a 5% residual stenosis.  A drug-eluting stent was successfully placed using a STENT ORSIRO 3.5X13.  Three-vessel CAD with stents in the LAD, circumflex and PDA. Culprit lesion is severe 95% proximal edge lesion and ostial LAD ->  successfully treated with scoring balloon angioplasty and DES stent overlapping the original stent (STENT ORSIRO 3.5X13 -postdilated to 4.0 mm) Patent stents in circumflex and RPDA Normal LV function with normal LVEDP.   As the patient  has an ostial LAD stent, I felt it more prudent to monitor the patient overnight as opposed to same-day discharge. He will be transferred to postprocedure unit for ongoing care.  Continue home medications with exception of his diabetes medications (will cover with sliding scale insulin)  Recommend dual antiplatelet therapy with Aspirin 81mg  daily and Ticagrelor 90mg  twice dailylong-term (beyond 12 months) because of Ostial LAD stent. Would be okay to stop aspirin after 3 to 6 months, but would continue Brilinta at 90 mg for 1 year and then reduced to 60 mg/convert to Plavix 75 mg after 1 year.[name].  He will follow-up with Dr. Chilton Siiffany Spencer.   Bryan Lemmaavid Harding, M.D., M.S. Interventional Cardiologist       _____________   History of Present Illness     68 y.o.malewith CAD (PCI of LAD 2003, PDA/LCx in 2007), DM, CKD III by labs. HTN, HLD, arthritis, GERD, morbid obesity was recently seen in the office to establish care on 07/26/18 with symptoms concerning for unstable angina. Pre-cath labs showed continued renal insufficiency so it was suggested to hold lisinopril 2 days prior to cath. He was admitted 07/29/2018 for planned procedure.  Hospital Course     1. Unstable angina/CAD - cath showed 85% ostial LAD just prior to previous stent, treated with DES overlapping previous stent. Otherwise nonobstructive disease noted. EF 55-65%, LVEDP normal. Per Dr. Herbie BaltimoreHarding, "Recommend dual antiplatelet therapy with Aspirin 81mg  daily and Ticagrelor 90mg  twice dailylong-term (beyond 12 months) because of Ostial LAD stent. Would be okay to stop aspirin after 3 to 6 months, but would continue Brilinta at 90 mg for 1 year and then reduced to 60 mg/convert to Plavix  75 mg after 1 year." Ultimate decision making will be up to Dr. Duke Salviaandolph about duration. Atorvastatin was continued (recent LDL 59). Metoprolol was stopped as below  2. Intermittent type 2 second degree AV block - this was initially noted the afternoon after cath. He was noted to have several episodes of Mobitz 2nd degree AV block type 2, with dropping up to 2 beats at a time. This was also noted while sleeping. He had taken his metoprolol at home prior to cath, but it was held once this was recognized. TSH was normal. He was totally asymptomatic. He wished to go home day after cath but it was recommended he stay for continued observation for one more night. After d/c of metoprolol, he only had episode overnight while sleeping. Dr. Ladona Ridgelaylor reviewed strips and case and felt no specific intervention was needed at this time and recommended watchful waiting for any symptoms to correlate. He did not feel event monitoring was necessary. Patient was advised to avoid HR-slowing medicines and call if any new symptoms. He denied h/o presyncope or syncope.  3. CKD stage III - Previously 1.24 in 11/2017. It was 1.57 prior to cath, and 1.45 post-cath. Dr. Duke Salviaandolph recommended to decrease lisinopril to 20mg  daily and add amlodipine 5mg  daily to accommodate BP control since BB was also stopped. He was advised to d/c NSAIDS. Lab order was entered for him to return Tuesday 9/10 to lab for BMET.  4. HTN -changes made as above. Likely also driven by #5.  5. Morbid obesity/suspected OSA - pending OP sleep study. Discussed role of weight in management.  6. DM - he was advised that he can resume Metformin tomorrow.  The patient feels well this morning. Dr. Duke Salviaandolph has seen and examined the patient today and feels he is stable for discharge. He has a  f/u appointment in 2 months but also needs a post cath check within 2 weeks. I have sent a message to our office's scheduling team requesting a follow-up appointment, and  our office will call the patient with this information. His usual [facility] is closed today but he will need to pick up rx's for Brilinta and SL NTG today - per d/w pt/wife, will [facility]. He will get amlodipine/lisinopril prior to DC today so we will route these to [facility]. Also received handwritten 30day free Brilinta rx. Admit date: 07/29/2018 Discharge date: 07/31/2018  Primary Care Provider: Donita Brooks, MD  Primary Cardiologist: Chilton Si, MD  Discharge Diagnoses    Principal Problem:   Angina, class II [facility]) Active Problems:   Diabetes mellitus (HCC)   Essential hypertension   Hyperlipidemia due to dietary fat intake   CAD S/P percutaneous coronary angioplasty   Morbid obesity (HCC)   Exertional dyspnea   Mobitz type 2 second degree atrioventricular block   CKD (chronic kidney disease), stage III (HCC)   Allergies      Allergies  Allergen Reactions  . Beta Adrenergic Blockers     Metoprolol stopped 07/2018 due to 2nd degree type 2 AV block.    Diagnostic Studies/Procedures    Cath 07/29/18 Conclusion     Ost LAD lesion is 85% stenosed -just prior to previous stent. Previously placed prox LAD DES stent is 5% stenosed.  Following scoring balloon angioplasty, A drug-eluting stent was successfully placed overlapping the previous stent proximally, using a STENT ORSIRO 3.5X13 --postdilated to 4.0 mm  Post intervention, there is a 0% residual stenosis.  Previously placed Prox Cx to Dist Cx stent (DES) is widely patent. Ost 2nd Mrg lesion is 40% stenosed.  Ost Cx to Prox Cx lesion is 30% stenosed.  Previously placed RPDA-1 stent (DES) is widely patent. RPDA-2 lesion is 40% stenosed just beyond stent.  The left ventricular systolic function is normal. The left ventricular ejection fraction is 55-65% by visual estimate. LV end diastolic pressure is normal.  Post intervention, there is a 5% residual stenosis.  A drug-eluting  stent was successfully placed using a STENT ORSIRO 3.5X13.  Three-vessel CAD with stents in the LAD, circumflex and PDA. Culprit lesion is severe 95% proximal edge lesion and ostial LAD -> successfully treated with scoring balloon angioplasty and DES stent overlapping the original stent (STENT ORSIRO 3.5X13 -postdilated to 4.0 mm) Patent stents in circumflex and RPDA Normal LV function with normal LVEDP.   As the patient has an ostial LAD stent, I felt it more prudent to monitor the patient overnight as opposed to same-day discharge. He will be transferred to postprocedure unit for ongoing care.  Continue home medications with exception of his diabetes medications (will cover with sliding scale insulin)  Recommend dual antiplatelet therapy with Aspirin 81mg  daily and Ticagrelor 90mg  twice dailylong-term (beyond 12 months) because of Ostial LAD stent. Would be okay to stop aspirin after 3 to 6 months, but would continue Brilinta at 90 mg for 1 year and then reduced to 60 mg/convert to Plavix 75 mg after 1 year.[name]  He will follow-up with Dr. Chilton Si.   Bryan Lemma, M.D., M.S. Interventional Cardiologist       _____________   History of Present Illness     68 y.o.malewith CAD (PCI of LAD 2003, PDA/LCx in 2007), DM, CKD III by labs. HTN, HLD, arthritis, GERD, morbid obesity was recently seen in the office to establish care  on 07/26/18 with symptoms concerning for unstable angina. Pre-cath labs showed continued renal insufficiency so it was suggested to hold lisinopril 2 days prior to cath. He was admitted 07/29/2018 for planned procedure.  Hospital Course     1. Unstable angina/CAD - cath showed 85% ostial LAD just prior to previous stent, treated with DES overlapping previous stent. Otherwise nonobstructive disease noted. EF 55-65%, LVEDP normal. Per Dr. Herbie Baltimore, "Recommend dual antiplatelet therapy with Aspirin 81mg  daily and Ticagrelor 90mg  twice dailylong-term  (beyond 12 months) because of Ostial LAD stent. Would be okay to stop aspirin after 3 to 6 months, but would continue Brilinta at 90 mg for 1 year and then reduced to 60 mg/convert to Plavix 75 mg after 1 year." Ultimate decision making will be up to Dr. Duke Salvia about duration. Atorvastatin was continued (recent LDL 59). Metoprolol was stopped as below  2. Intermittent type 2 second degree AV block - this was initially noted the afternoon after cath. He was noted to have several episodes of Mobitz 2nd degree AV block type 2, with dropping up to 2 beats at a time. This was also noted while sleeping. He had taken his metoprolol at home prior to cath, but it was held once this was recognized. TSH was normal. He was totally asymptomatic. He wished to go home day after cath but it was recommended he stay for continued observation for one more night. After d/c of metoprolol, he only had episode overnight while sleeping. Dr. Ladona Ridgel reviewed strips and case and felt no specific intervention was needed at this time and recommended watchful waiting for any symptoms to correlate. He did not feel event monitoring was necessary. Patient was advised to avoid HR-slowing medicines and call if any new symptoms. He denied h/o presyncope or syncope.  3. CKD stage III - Previously 1.24 in 11/2017. It was 1.57 prior to cath, and 1.45 post-cath. Dr. Duke Salvia recommended to decrease lisinopril to 20mg  daily and add amlodipine 5mg  daily to accommodate BP control since BB was also stopped. He was advised to d/c NSAIDS. Lab order was entered for him to return Tuesday 9/10 to lab for BMET.  4. HTN -changes made as above. Likely also driven by #5.  5. Morbid obesity/suspected OSA - pending OP sleep study. Discussed role of weight in management.  6. DM - he was advised that he can resume Metformin tomorrow.  The patient feels well this morning. Dr. Duke Salvia has seen and examined the patient today and feels he is stable for  discharge. He has a f/u appointment in 2 months but also needs a post cath check within 2 weeks. I have sent a message to our office's scheduling team requesting a follow-up appointment, and our office will call the patient with this information. His usual [facility] is closed today but he will need to pick up rx's for Brilinta and SL NTG today - per d/w pt/wife, will [facility]. He will get amlodipine/lisinopril prior to DC today so we will route these to [facility]. Also received handwritten 30day free Brilinta rx.  Doing very well.  Chest pain and left arm pain has resolved.  Still with mild sob with activity.  EF on cath was reportedly normal.  Has not had a CXR.  Denies cough, pleurisy.  Does have a history of pulmonary nodule on CT from 2013 but patient never went for follow up CT.  Recent HgA1c was 7.7.  He has tried to eat better and his  FBS is 99-120.  He is not checking postprandial sugars.  At that time, my plan was:  Obtain CXR given sob but I suspect dyspnea is due to deconditioning.  Already on dual antiplatelet agents, high intensity statin, beta blocker but glycemic control is suspect.  Recommend FBS and 2 hr PPS be recorded and reviewed in 2 weeks.  Recommended gradually increasing exercise to improve deconditioning.  Recommended 10-20 lbs of weight loss. Recheck fasting labs in 3 months.    10/24/18 Patient is not exercising regularly.  He is checking his blood sugars and his fasting blood sugars have been between 100 and and 140.  His 2-hour postprandial sugars have been between 120 and 160.  I am very happy about this however he stopped checking them approximately 6 weeks ago.  Therefore we have no data over the last month and a half to see how his sugars been doing.  His blood pressure today is well controlled at 122/60.  He denies any chest pain although he does report shortness of breath with exertion consistent with deconditioning.  He denies any orthopnea.  He  denies any paroxysmal nocturnal dyspnea.  He denies any nausea or vomiting or diarrhea. Wt Readings from Last 3 Encounters:  01/23/19 280 lb (127 kg)  01/18/19 280 lb (127 kg)  10/24/18 281 lb (127.5 kg)   He has not lost substantial weight since his last visit.  He is not monitoring his diet.  He denies any polyuria, polydipsia, or blurry vision.  He denies any numbness or tingling in his feet.  He denies any dysesthesias or paresthesias in his feet.  He denies any myalgias or right upper quadrant pain.  He denies any melena or hematochezia  At that time, my plan was: Blood pressure today is well controlled.  I emphasized to the patient that to prevent future problems with his heart, we need to control his risk factors aggressively.  His risk factors include his sedentary lifestyle, his diet, his blood pressure, his diabetes, and his cholesterol.  I am very happy with his blood pressure.  His last blood sugars were excellent.  I will check a hemoglobin A1c.  Ideally I would like his hemoglobin A1c to be less than 7.  I will also check a fasting lipid panel.  Ideally his LDL cholesterol be less than 70.  He is currently taking aspirin and Brilinta.  Therefore I encourage the patient that the best place he can make positive change his in changing his lifestyle.  I encouraged 30 minutes of aerobic exercise 5 days a week.  I encouraged a diet low in carbohydrates and low in saturated fat.  I recommended 30 to 40 pounds weight loss gradually.  If he makes these changes, I believe he can add quality years to his life and have an improved quality of life.  I would recheck the patient's fasting lab work in 6 months barring any unforeseen complications on this lab work  01/23/19 Since I last saw the patient, he has lost 1 pound.  He has not changed his diet.  He admits that he is still eating little Debbie cakes.  He is not engaging in any vigorous aerobic exercise.  He is developed a burning stinging pain in his  feet primarily at night.  They also feel cold.  Diabetic foot exam is performed today and is normal.  He has excellent pulses in both feet however I believe he is developing dysesthesias and paresthesias in his feet due  to diabetic neuropathy.  He states that his fasting blood sugars in the morning are 1 30-1 60.  He is not checking 2-hour postprandial sugars.  He has a diabetic eye exam scheduled for March.  He denies any chest pain shortness of breath or dyspnea on exertion.  He does report a pill-rolling tremor in either hand that occurs when he is at rest that extinguishes with activity.  However he also reports an essential tremor that is worse with focus and activity.  He denies any decreased arm swing with ambulation.  He denies any stumbling or falling.  He denies any masslike facies or decreased blink.  There is no cogwheel rigidity on his exam today.  He denies any clumsiness or loss of dexterity or short-term memory loss Past Medical History:  Diagnosis Date  . Arthritis    "all over" (07/29/2018)  . CAD S/P percutaneous coronary angioplasty    a. stent to mLAD 11/2001. b. DES to PDA/mLCx 2007. c. DES to ostial LAD 07/2018.  [name] Chronic lower back pain   . CKD (chronic kidney disease), stage III (HCC)   . CKD (chronic kidney disease), stage III (HCC)   . Diabetic retinopathy (HCC)   . GERD (gastroesophageal reflux disease)   . High cholesterol   . History of kidney stones   . Hypertension   . Mobitz type 2 second degree atrioventricular block    a. noted during 07/2018 adm, metoprolol discontinued.  . Morbid obesity (HCC) 07/28/2018  . Prostatitis   . Pulmonary nodule 2013   CT  . Sleep apnea    stopbang=5  . Suspected sleep apnea   . Type II diabetes mellitus (HCC)    Past Surgical History:  Procedure Laterality Date  . BACK SURGERY    . CARDIAC CATHETERIZATION  JUNE 2003   PATENT LAD STENT/ BODERLINE OBSTRUCTIVE DISEASE POSTERIOR DESCENDING ARTERY/ NORMAL LVF  . CATARACT EXTRACTION  W/ INTRAOCULAR LENS  IMPLANT, BILATERAL Bilateral 2017  . CERVICAL SCOVILLE FORAMINOTOMY W/ EXCISION OF HERNIATED NUCLEC PULPOSUS  2009   C6 - 7  . CORONARY ANGIOPLASTY WITH STENT PLACEMENT  03/30/2006   DR EDMUNDS - 90% LCx@OM2  TAXUS  DES 3.0 X 20 -> 3.25 mm,   95% RPDA -- TAXUS DES 3.0 X 16 --> 3.5 mm  . CORONARY ANGIOPLASTY WITH STENT PLACEMENT  11/2001   (Dr. Ty Hilts for Dr. Mayford Knife) - mLAD@D2  - TAXUS EXPRESS DES 3.5 x 15   . CORONARY ANGIOPLASTY WITH STENT PLACEMENT  07/29/2018  . CORONARY STENT INTERVENTION N/A 07/29/2018   Procedure: CORONARY STENT INTERVENTION;  Surgeon: Marykay Lex, MD;  Location: [facility] INVASIVE CV LAB;  Service: Cardiovascular;  Laterality: N/A;  . CYSTOSCOPY W/ RETROGRADES  05/04/2012   Procedure: CYSTOSCOPY WITH RETROGRADE PYELOGRAM;  Surgeon: Milford Cage, MD;  Location: [facility];  Service: Urology;  Laterality: Left;  . CYSTOSCOPY W/ URETERAL STENT PLACEMENT  05/04/2012   Procedure: CYSTOSCOPY WITH STENT REPLACEMENT;  Surgeon: Milford Cage, MD;  Location: [facility];  Service: Urology;  Laterality: Left;  . CYSTOSCOPY WITH STENT PLACEMENT Left 04/04/2012  . CYSTOSCOPY WITH URETEROSCOPY  05/04/2012   Procedure: CYSTOSCOPY WITH URETEROSCOPY;  Surgeon: Milford Cage, MD;  Location: [facility];  Service: Urology;  Laterality: Left;      . LEFT HEART CATH AND CORONARY ANGIOGRAPHY N/A 07/29/2018   Procedure: LEFT HEART CATH AND CORONARY ANGIOGRAPHY;  Surgeon: Marykay Lex, MD;  Location: [facility] INVASIVE CV LAB;  Service: Cardiovascular;  Laterality: N/A;  .  LEFT URETEROSCOPIC STONE EXTRACTION  03-14-2003   X2   Current Outpatient Medications on File Prior to Visit  Medication Sig Dispense Refill  . amLODipine (NORVASC) 10 MG tablet Take 1 tablet (10 mg total) by mouth daily. 90 tablet 2  . aspirin 81 MG tablet Take 81 mg by mouth daily.    [name] atorvastatin (LIPITOR) 80 MG tablet Take 1 tablet (80 mg total) by mouth daily. 90 tablet 3  . BD ULTRA-FINE PEN  NEEDLES 29G X 12.7MM MISC USE DAILY WITH VICTOZA 100 each 3  . empagliflozin (JARDIANCE) 25 MG TABS tablet TAKE 1 TABLET BY MOUTH ONCE DAILY 90 tablet 0  . glipiZIDE (GLUCOTROL) 10 MG tablet TAKE 1 TABLET BY MOUTH ONCE DAILY 90 tablet 1  . glucose blood (ACCU-CHEK AVIVA PLUS) test strip Use as instructed to monitor FSBS 4x daily due to fluctuating blood glucose levels. Dx: E11.65. 100 each 12  . insulin lispro (HUMALOG KWIKPEN) 100 UNIT/ML KwikPen Inject 0.05 mLs (5 Units total) into the skin 2 (two) times daily. With lunch and supper 3 mL 2  . Insulin Pen Needle (SURE COMFORT PEN NEEDLES) 32G X 6 MM MISC Use daily with Victoza 100 each 5  . LANTUS SOLOSTAR 100 UNIT/ML Solostar Pen INJECT 32 UNITS UNDER THE SKIN EVERY MORNING (Patient taking differently: Inject 32 Units into the skin daily. INJECT 32 UNITS UNDER THE SKIN EVERY MORNIN) 30 mL 3  . lisinopril (PRINIVIL,ZESTRIL) 20 MG tablet Take 1 tablet (20 mg total) by mouth daily. 30 tablet 6  . metFORMIN (GLUCOPHAGE) 1000 MG tablet TAKE 1 TABLET BY MOUTH TWICE DAILY WITH A MEAL 180 tablet 0  . nitroGLYCERIN (NITROSTAT) 0.4 MG SL tablet Place 1 tablet (0.4 mg total) under the tongue every 5 (five) minutes as needed for chest pain (up to 3 doses. If taking 3rd dose call 911). 25 tablet 3  . pantoprazole (PROTONIX) 40 MG tablet TAKE 1 TABLET BY MOUTH ONCE DAILY 90 tablet 0  . ticagrelor (BRILINTA) 90 MG TABS tablet Take 1 tablet (90 mg total) by mouth 2 (two) times daily. 60 tablet 11  . VICTOZA 18 MG/3ML SOPN INJECT 0.2 MLS (1.2 MG TOTAL) INTO THE SKIN DAILY 18 mL 0   No current facility-administered medications on file prior to visit.    Allergies  Allergen Reactions  . Beta Adrenergic Blockers     Metoprolol stopped 07/2018 due to 2nd degree type 2 AV block.   Social History   Socioeconomic History  . Marital status: Married    Spouse name: Not on file  . Number of children: Not on file  . Years of education: Not on file  . Highest  education level: Not on file  Occupational History  . Not on file  Social Needs  . Financial resource strain: Not on file  . Food insecurity:    Worry: Not on file    Inability: Not on file  . Transportation needs:    Medical: Not on file    Non-medical: Not on file  Tobacco Use  . Smoking status: Never Smoker  . Smokeless tobacco: Never Used  Substance and Sexual Activity  . Alcohol use: Never    Frequency: Never  . Drug use: Never  . Sexual activity: Not Currently  Lifestyle  . Physical activity:    Days per week: Not on file    Minutes per session: Not on file  . Stress: Not on file  Relationships  . Social connections:  Talks on phone: Not on file    Gets together: Not on file    Attends religious service: Not on file    Active member of club or organization: Not on file    Attends meetings of clubs or organizations: Not on file    Relationship status: Not on file  . Intimate partner violence:    Fear of current or ex partner: Not on file    Emotionally abused: Not on file    Physically abused: Not on file    Forced sexual activity: Not on file  Other Topics Concern  . Not on file  Social History Narrative  . Not on file      Review of Systems  All other systems reviewed and are negative.      Objective:   Physical Exam  Constitutional: He appears well-developed and well-nourished.  Neck: Neck supple. No JVD present. No thyromegaly present.  Cardiovascular: Normal rate, regular rhythm, normal heart sounds and intact distal pulses.  No murmur heard. Pulmonary/Chest: Effort normal and breath sounds normal. No respiratory distress. He has no wheezes. He has no rales. He exhibits no tenderness.  Abdominal: Soft. Bowel sounds are normal. He exhibits no distension and no mass. There is no abdominal tenderness. There is no rebound and no guarding.  Musculoskeletal:        General: No edema.  Lymphadenopathy:    He has no cervical adenopathy.  Skin: No rash  noted. No erythema.  Vitals reviewed.         Assessment & Plan:   Diabetes mellitus type 2, uncontrolled, with complications (HCC) - Plan: Hemoglobin A1c, CBC with Differential/Platelet, COMPLETE METABOLIC PANEL WITH GFR, Lipid panel, Microalbumin, urine  CAD S/P percutaneous coronary angioplasty  Essential hypertension  Pure hypercholesterolemia  Physical exam today is normal aside from obesity.  I recommended decreasing his consumption of junk food.  I recommended 30 minutes a day of aerobic exercise.  I believe the pain that he is developing in his feet at night is diabetic neuropathy.  He will start gabapentin 300 mg p.o. nightly and can increase to 3 times a day if necessary.  Diabetic eye exam is scheduled for March.  Blood pressure today is outstanding.  I will check a hemoglobin A1c.  Goal hemoglobin A1c is less than 7.  Patient likely will require more insulin to better manage his blood sugars based on his dietary indiscretion but I will await the results of his lab work.  Check a fasting lipid panel.  Goal LDL cholesterol is less than 70.  We will clinically monitor the tremor for now.  The pill-rolling tremor is concerning but he denies any other symptoms of Parkinson's disease.  If the tremor worsens, will consult neurology.

## 2019-01-24 LAB — CBC WITH DIFFERENTIAL/PLATELET
Absolute Monocytes: 834 cells/uL (ref 200–950)
BASOS PCT: 0.7 %
Basophils Absolute: 72 cells/uL (ref 0–200)
EOS ABS: 453 {cells}/uL (ref 15–500)
Eosinophils Relative: 4.4 %
HCT: 46.4 % (ref 38.5–50.0)
Hemoglobin: 15.1 g/dL (ref 13.2–17.1)
Lymphs Abs: 2266 cells/uL (ref 850–3900)
MCH: 28.6 pg (ref 27.0–33.0)
MCHC: 32.5 g/dL (ref 32.0–36.0)
MCV: 87.9 fL (ref 80.0–100.0)
MPV: 8.6 fL (ref 7.5–12.5)
Monocytes Relative: 8.1 %
Neutro Abs: 6674 cells/uL (ref 1500–7800)
Neutrophils Relative %: 64.8 %
Platelets: 370 10*3/uL (ref 140–400)
RBC: 5.28 10*6/uL (ref 4.20–5.80)
RDW: 12.9 % (ref 11.0–15.0)
Total Lymphocyte: 22 %
WBC: 10.3 10*3/uL (ref 3.8–10.8)

## 2019-01-24 LAB — COMPLETE METABOLIC PANEL WITH GFR
AG Ratio: 1.8 (calc) (ref 1.0–2.5)
ALT: 43 U/L (ref 9–46)
AST: 28 U/L (ref 10–35)
Albumin: 4.6 g/dL (ref 3.6–5.1)
Alkaline phosphatase (APISO): 100 U/L (ref 35–144)
BILIRUBIN TOTAL: 0.9 mg/dL (ref 0.2–1.2)
BUN: 15 mg/dL (ref 7–25)
CHLORIDE: 103 mmol/L (ref 98–110)
CO2: 22 mmol/L (ref 20–32)
Calcium: 10 mg/dL (ref 8.6–10.3)
Creat: 1.21 mg/dL (ref 0.70–1.25)
GFR, Est African American: 71 mL/min/{1.73_m2} (ref >=60)
GFR, Est Non African American: 62 mL/min/{1.73_m2} (ref >=60)
Globulin: 2.6 g/dL (calc) (ref 1.9–3.7)
Glucose, Bld: 97 mg/dL (ref 65–99)
Potassium: 4.4 mmol/L (ref 3.5–5.3)
Sodium: 140 mmol/L (ref 135–146)
TOTAL PROTEIN: 7.2 g/dL (ref 6.1–8.1)

## 2019-01-24 LAB — LIPID PANEL
Cholesterol: 86 mg/dL (ref <200)
HDL: 47 mg/dL (ref >=40)
LDL Cholesterol (Calc): 22 mg/dL (calc)
Non-HDL Cholesterol (Calc): 39 mg/dL (calc) (ref <130)
Total CHOL/HDL Ratio: 1.8 (calc) (ref <5.0)
Triglycerides: 85 mg/dL (ref <150)

## 2019-01-24 LAB — HEMOGLOBIN A1C
EAG (MMOL/L): 10.8 (calc)
Hgb A1c MFr Bld: 8.4 % of total Hgb — ABNORMAL HIGH (ref <5.7)
Mean Plasma Glucose: 194 (calc)

## 2019-01-24 LAB — MICROALBUMIN, URINE: MICROALB UR: 0.5 mg/dL

## 2019-02-03 ENCOUNTER — Other Ambulatory Visit (HOSPITAL_COMMUNITY): Payer: Self-pay | Admitting: Physician Assistant

## 2019-02-22 ENCOUNTER — Other Ambulatory Visit: Payer: Self-pay

## 2019-02-22 MED ORDER — AMLODIPINE BESYLATE 10 MG PO TABS: 10.0000 mg | ORAL_TABLET | Freq: Every day | ORAL | 3 refills | Status: DC

## 2019-04-03 ENCOUNTER — Other Ambulatory Visit: Payer: Self-pay | Admitting: Family Medicine

## 2019-04-05 ENCOUNTER — Other Ambulatory Visit: Payer: Self-pay | Admitting: Family Medicine

## 2019-05-16 ENCOUNTER — Other Ambulatory Visit: Payer: Self-pay | Admitting: Family Medicine

## 2019-07-05 ENCOUNTER — Other Ambulatory Visit: Payer: Self-pay

## 2019-07-05 MED ORDER — TICAGRELOR 90 MG PO TABS: 90.0000 mg | ORAL_TABLET | Freq: Two times a day (BID) | ORAL | 1 refills | Status: DC

## 2019-07-05 NOTE — Telephone Encounter (Signed)
Rx(s) sent to pharmacy electronically.  

## 2019-07-06 ENCOUNTER — Other Ambulatory Visit: Payer: Self-pay | Admitting: Family Medicine

## 2019-07-12 ENCOUNTER — Telehealth: Payer: Self-pay | Admitting: Family Medicine

## 2019-07-12 NOTE — Telephone Encounter (Signed)
Patients wife called in requesting medication for pain. She states patient has a kidney stone he has had these in the past.  CB# [phone]

## 2019-07-13 ENCOUNTER — Other Ambulatory Visit: Payer: Self-pay | Admitting: Family Medicine

## 2019-07-13 MED ORDER — HYDROCODONE-ACETAMINOPHEN 5-325 MG PO TABS: 1.0000 | ORAL_TABLET | Freq: Four times a day (QID) | ORAL | 0 refills | Status: DC | PRN

## 2019-07-13 NOTE — Telephone Encounter (Signed)
I sent in norco

## 2019-07-13 NOTE — Telephone Encounter (Signed)
MD please advise

## 2019-07-13 NOTE — Telephone Encounter (Signed)
Call placed to patient and patient wife made aware.  

## 2019-07-24 ENCOUNTER — Other Ambulatory Visit: Payer: Self-pay | Admitting: Family Medicine

## 2019-07-26 ENCOUNTER — Other Ambulatory Visit: Payer: Self-pay | Admitting: Family Medicine

## 2019-08-14 ENCOUNTER — Other Ambulatory Visit: Payer: Self-pay | Admitting: Family Medicine

## 2019-08-21 ENCOUNTER — Ambulatory Visit (INDEPENDENT_AMBULATORY_CARE_PROVIDER_SITE_OTHER): Payer: Medicare Other | Admitting: Family Medicine

## 2019-08-21 ENCOUNTER — Encounter: Payer: Self-pay | Admitting: Family Medicine

## 2019-08-21 VITALS — BP 110/60 | HR 78 | Temp 98.2°F | Resp 18 | Ht 69.5 in | Wt 272.0 lb

## 2019-08-21 DIAGNOSIS — I10 Essential (primary) hypertension: Secondary | ICD-10-CM

## 2019-08-21 DIAGNOSIS — Z9861 Coronary angioplasty status: Secondary | ICD-10-CM

## 2019-08-21 DIAGNOSIS — E118 Type 2 diabetes mellitus with unspecified complications: Secondary | ICD-10-CM | POA: Diagnosis not present

## 2019-08-21 DIAGNOSIS — I2510 Atherosclerotic heart disease of native coronary artery without angina pectoris: Secondary | ICD-10-CM

## 2019-08-21 DIAGNOSIS — E7800 Pure hypercholesterolemia, unspecified: Secondary | ICD-10-CM

## 2019-08-21 NOTE — Progress Notes (Signed)
Subjective:    Patient ID: Erik Hancock, male    DOB: 01-28-1951, 68 y.o.   MRN: [id number]  Medication Refill   01/2017 Patient has not been seen in approximately a year. Overall he says he's been doing well. He admits that recently his sugars a been running higher as high as 200. He denies any polyuria or polydipsia. He does have blurry vision but he sees an eye specialist. He has been receiving injections in his eye due to neovascularization from diabetic retinopathy care of Dr. Ashley Royalty for the last several years. He is also recently had cataract surgery. Denies any chest pain shortness of breath or dyspnea on exertion. He does have a history of drug-eluting stents 3. He is still on dual antiplatelet therapy. However he has not seen a cardiologist in quite some time and therefore I believe that this is an oversight on my part area and I encouraged him to see his cardiologist for regular follow-up and he denies any myalgias or right upper quadrant pain.  At that time, my plan was: Blood pressure is controlled. I will check a hemoglobin A1c, a lipid panel, urine microalbumin, and a CMP. Goal LDL cholesterol is less than 70. Goal hemoglobin A1c is less than 7. Given his history of coronary artery disease, I would recommend adding jardiance due to the reduction in cardiovascular mortality. I discussed this at length with the patient. He would be willing to consider this as long as his affordable. Strongly encouraged the patient to follow-up with his cardiologist. I believe he can likely stop the Plavix as he is been on this medication for several years now. I would like him to continue aspirin  05/10/17 A1c was 8.4 at that time and we added jardiance 25 mg poqday.  He is here for follow up.  He has lost 7 pounds since his last visit. However he admits that he has not changed his diet nor is he exercising. He does not have any sugar readings to compare. He does report 3 random sugars of 114, 144, and  180. Some are fasting. Some of those numbers are random. He does report increasing urine output since starting the new medication. He also reports trouble sleeping. His wife states that he snores at night and makes unusual gasping sounds and sleep. He reports hypersomnolence during the day. He falls asleep easily in a chair. He does not fall asleep driving. However he can easily fall asleep after eating a meal even in the middle of the day. He has been told in the past that he has sleep apnea after he recovered from anesthesia. However this is remote.  At that time, my plan was: I will schedule patient to me with a neurologist for a split-level sleep study to evaluate for obstructive sleep apnea. His hypersomnolence, his body habitus, and some of his other symptoms suggest a high likelihood of structure sleep apnea. His blood pressure today is well controlled at 110/68. He denies any chest pain shortness of breath or dyspnea on exertion. I will check a fasting lipid panel. His goal LDL cholesterol is less than 70 given his history of ASCVD. I will also check a hemoglobin A1c. Ideally I like his hemoglobin A1c less than 7. Strongly recommended he start exercising 30 minutes a day 5 days a week and restrict his calorie intake to try to achieve 20-30 pounds weight loss gradually over the next 6 months.  12/10/17 HgA1c at that time was elevated at 8.4.  Patient is here for follow up.  He stopped his jardiance since I saw him.  Still on glucotrol, lantus 32 units a day, and metformin 1000 bid. The patient discontinue Jardiance due to polyuria.  He states his blood sugars were averaging between 120 and 145 however since discontinuation of the medication they are much higher. He is not watching his diet and he is not exercising. He is also overdue for a diabetic eye exam. He denies any neuropathy in his feet. He denies any blurry vision. He denies any chest pain shortness of breath or dyspnea on exertion. He denies any  myalgias or right upper quadrant pain. At that time, my plan was: Blood sugar today is adequately controlled. I will check a fasting lipid panel. Given his history of diabetes and coronary artery disease, his goal LDL cholesterol is less than 70. Given his history of retinopathy, I recommended he follow-up with his ophthalmologist as he has not seen his ophthalmologist at 16 months. I recommended checking his hemoglobin A1c. If greater than 8 as anticipated, I would recommend starting the patient on Victoza.  Flu shot is up-to-date. Diabetic foot exam is performed today  01/18/18 Please see labs.  Hemoglobin A1c was 9.3.  Therefore we added Victoza 1.2 mg daily and asked the patient to return in 1 month for recheck.  He brings in his sugars today and I am pleasantly surprised.  His fasting sugars in the morning are averaging between 70 and 147 with the vast majority 80-120.  His evening sugars are averaging 99-141 with the vast majority between 101 130.  These are outstanding.  He denies any nausea or vomiting.  His polyuria has resolved.  He is lost 5 pounds and strongly work to change his diet and is starting to exercise more.  At that time, my plan was: I am extremely proud of this patient.  His sugars have improved dramatically and he is losing weight.  Continue Victoza 1.2 mg subcu daily and recheck hemoglobin A1c and fasting lab work in 3 months.  I congratulated him on his success thus far.  07/19/18 Patient has not followed up as planned in May.  Furthermore he is not regularly checking his sugars.  This morning his fasting sugar was in the 90s however he also reports occasional sugars in the 180s.  I question how often he is actually been checking his sugars.  However he is being compliant with big toes a.  He is gained a pound since his last visit.  However the reason he made his visit has been worsening dyspnea on exertion.  Symptoms began approximately 1 month ago.  He states that whenever he is  outside working, especially in the hot air, he has a very difficult time catching his breath.  It is starting to occur even with minimal activity.  Furthermore he will break into a sweat.  Occasionally he will develop sharp pains on the left side of his chest that will radiate into his left arm.  These go away with rest.  Pattern of increasing dyspnea on exertion and chest pain with activity radiating into the left arm is concerning for angina.  He denies any chest pain at rest.  He denies any nausea or vomiting however he is concerned that he feels extremely weak and tired and short of breath with minimal activity.  AT that time, my plan was: On exam today, there is no JVD.  There are no crackles on pulmonary exam.  There is  no pitting edema in his extremities.  He does not appear to be fluid overloaded.  His lungs are clear to auscultation bilaterally.  My concern is that the patient is demonstrating signs of angina with increasing dyspnea on exertion.  Therefore I believe he needs to see his cardiologist as soon as possible for a stress test and possibly an echocardiogram.  In the meantime, I will check his lab work to monitor his risk factors including a CBC, fasting lipid panel, CMP, and hemoglobin A1c.  His goal LDL will be less than 60.  His goal hemoglobin A1c would be less than 7.  I will also add Imdur 30 mg poqday to see if this helps with his dyspnea on exertion and exertional chest pain.  I have cautioned the patient to be extremely cautious until he sees his cardiologist and not to aggressively push his activity until after he has had a stress test.  He is already on dual antiplatelet agent, aspirin and Plavix.  He is on Toprol-XL 25 mg a day and his heart rate is 60 bpm.  However I will discontinue simvastatin and replacing with Lipitor 80 mg a day for plaque stabilization as well as to optimize his statin.  EKG shows sinus rhythm with first-degree AV block.  There are Q waves in leads aVF and III.   Otherwise there are no ST-T segment changes.  08/09/18 Was admitted to the hospital.  I have copied relevant portions of the DC summary below for my reference: Admit date: 07/29/2018 Discharge date: 07/31/2018  Primary Care Provider: Donita Brooks, MD  Primary Cardiologist: Chilton Si, MD  Discharge Diagnoses    Principal Problem:   Angina, class II [facility]) Active Problems:   Diabetes mellitus (HCC)   Essential hypertension   Hyperlipidemia due to dietary fat intake   CAD S/P percutaneous coronary angioplasty   Morbid obesity (HCC)   Exertional dyspnea   Mobitz type 2 second degree atrioventricular block   CKD (chronic kidney disease), stage III (HCC)   Allergies      Allergies  Allergen Reactions   Beta Adrenergic Blockers     Metoprolol stopped 07/2018 due to 2nd degree type 2 AV block.    Diagnostic Studies/Procedures    Cath 07/29/18 Conclusion     Ost LAD lesion is 85% stenosed -just prior to previous stent. Previously placed prox LAD DES stent is 5% stenosed.  Following scoring balloon angioplasty, A drug-eluting stent was successfully placed overlapping the previous stent proximally, using a STENT ORSIRO 3.5X13 --postdilated to 4.0 mm  Post intervention, there is a 0% residual stenosis.  Previously placed Prox Cx to Dist Cx stent (DES) is widely patent. Ost 2nd Mrg lesion is 40% stenosed.  Ost Cx to Prox Cx lesion is 30% stenosed.  Previously placed RPDA-1 stent (DES) is widely patent. RPDA-2 lesion is 40% stenosed just beyond stent.  The left ventricular systolic function is normal. The left ventricular ejection fraction is 55-65% by visual estimate. LV end diastolic pressure is normal.  Post intervention, there is a 5% residual stenosis.  A drug-eluting stent was successfully placed using a STENT ORSIRO 3.5X13.  Three-vessel CAD with stents in the LAD, circumflex and PDA. Culprit lesion is severe 95% proximal edge lesion and ostial LAD ->  successfully treated with scoring balloon angioplasty and DES stent overlapping the original stent (STENT ORSIRO 3.5X13 -postdilated to 4.0 mm) Patent stents in circumflex and RPDA Normal LV function with normal LVEDP.   As the patient  has an ostial LAD stent, I felt it more prudent to monitor the patient overnight as opposed to same-day discharge. He will be transferred to postprocedure unit for ongoing care.  Continue home medications with exception of his diabetes medications (will cover with sliding scale insulin)  Recommend dual antiplatelet therapy with Aspirin 81mg  daily and Ticagrelor 90mg  twice dailylong-term (beyond 12 months) because of Ostial LAD stent. Would be okay to stop aspirin after 3 to 6 months, but would continue Brilinta at 90 mg for 1 year and then reduced to 60 mg/convert to Plavix 75 mg after 1 year.[name]  He will follow-up with Dr. Chilton Si.   Bryan Lemma, M.D., M.S. Interventional Cardiologist       _____________   History of Present Illness     68 y.o.malewith CAD (PCI of LAD 2003, PDA/LCx in 2007), DM, CKD III by labs. HTN, HLD, arthritis, GERD, morbid obesity was recently seen in the office to establish care on 07/26/18 with symptoms concerning for unstable angina. Pre-cath labs showed continued renal insufficiency so it was suggested to hold lisinopril 2 days prior to cath. He was admitted 07/29/2018 for planned procedure.  Hospital Course     1. Unstable angina/CAD - cath showed 85% ostial LAD just prior to previous stent, treated with DES overlapping previous stent. Otherwise nonobstructive disease noted. EF 55-65%, LVEDP normal. Per Dr. Herbie Baltimore, "Recommend dual antiplatelet therapy with Aspirin 81mg  daily and Ticagrelor 90mg  twice dailylong-term (beyond 12 months) because of Ostial LAD stent. Would be okay to stop aspirin after 3 to 6 months, but would continue Brilinta at 90 mg for 1 year and then reduced to 60 mg/convert to Plavix  75 mg after 1 year." Ultimate decision making will be up to Dr. Duke Salvia about duration. Atorvastatin was continued (recent LDL 59). Metoprolol was stopped as below  2. Intermittent type 2 second degree AV block - this was initially noted the afternoon after cath. He was noted to have several episodes of Mobitz 2nd degree AV block type 2, with dropping up to 2 beats at a time. This was also noted while sleeping. He had taken his metoprolol at home prior to cath, but it was held once this was recognized. TSH was normal. He was totally asymptomatic. He wished to go home day after cath but it was recommended he stay for continued observation for one more night. After d/c of metoprolol, he only had episode overnight while sleeping. Dr. Ladona Ridgel reviewed strips and case and felt no specific intervention was needed at this time and recommended watchful waiting for any symptoms to correlate. He did not feel event monitoring was necessary. Patient was advised to avoid HR-slowing medicines and call if any new symptoms. He denied h/o presyncope or syncope.  3. CKD stage III - Previously 1.24 in 11/2017. It was 1.57 prior to cath, and 1.45 post-cath. Dr. Duke Salvia recommended to decrease lisinopril to 20mg  daily and add amlodipine 5mg  daily to accommodate BP control since BB was also stopped. He was advised to d/c NSAIDS. Lab order was entered for him to return Tuesday 9/10 to lab for BMET.  4. HTN -changes made as above. Likely also driven by #5.  5. Morbid obesity/suspected OSA - pending OP sleep study. Discussed role of weight in management.  6. DM - he was advised that he can resume Metformin tomorrow.  The patient feels well this morning. Dr. Duke Salvia has seen and examined the patient today and feels he is stable for discharge. He has a  f/u appointment in 2 months but also needs a post cath check within 2 weeks. I have sent a message to our office's scheduling team requesting a follow-up appointment, and  our office will call the patient with this information. His usual [facility] is closed today but he will need to pick up rx's for Brilinta and SL NTG today - per d/w pt/wife, will [facility] He will get amlodipine/lisinopril prior to DC today so we will route these to [facility]. Also received handwritten 30day free Brilinta rx. Admit date: 07/29/2018 Discharge date: 07/31/2018  Primary Care Provider: Donita Brooks, MD  Primary Cardiologist: Chilton Si, MD  Discharge Diagnoses    Principal Problem:   Angina, class II [facility]) Active Problems:   Diabetes mellitus (HCC)   Essential hypertension   Hyperlipidemia due to dietary fat intake   CAD S/P percutaneous coronary angioplasty   Morbid obesity (HCC)   Exertional dyspnea   Mobitz type 2 second degree atrioventricular block   CKD (chronic kidney disease), stage III (HCC)   Allergies      Allergies  Allergen Reactions   Beta Adrenergic Blockers     Metoprolol stopped 07/2018 due to 2nd degree type 2 AV block.    Diagnostic Studies/Procedures    Cath 07/29/18 Conclusion     Ost LAD lesion is 85% stenosed -just prior to previous stent. Previously placed prox LAD DES stent is 5% stenosed.  Following scoring balloon angioplasty, A drug-eluting stent was successfully placed overlapping the previous stent proximally, using a STENT ORSIRO 3.5X13 --postdilated to 4.0 mm  Post intervention, there is a 0% residual stenosis.  Previously placed Prox Cx to Dist Cx stent (DES) is widely patent. Ost 2nd Mrg lesion is 40% stenosed.  Ost Cx to Prox Cx lesion is 30% stenosed.  Previously placed RPDA-1 stent (DES) is widely patent. RPDA-2 lesion is 40% stenosed just beyond stent.  The left ventricular systolic function is normal. The left ventricular ejection fraction is 55-65% by visual estimate. LV end diastolic pressure is normal.  Post intervention, there is a 5% residual stenosis.  A drug-eluting  stent was successfully placed using a STENT ORSIRO 3.5X13.  Three-vessel CAD with stents in the LAD, circumflex and PDA. Culprit lesion is severe 95% proximal edge lesion and ostial LAD -> successfully treated with scoring balloon angioplasty and DES stent overlapping the original stent (STENT ORSIRO 3.5X13 -postdilated to 4.0 mm) Patent stents in circumflex and RPDA Normal LV function with normal LVEDP.   As the patient has an ostial LAD stent, I felt it more prudent to monitor the patient overnight as opposed to same-day discharge. He will be transferred to postprocedure unit for ongoing care.  Continue home medications with exception of his diabetes medications (will cover with sliding scale insulin)  Recommend dual antiplatelet therapy with Aspirin  daily and Ticagrelor  twice dailylong-term (beyond 12 months) because of Ostial LAD stent. Would be okay to stop aspirin after 3 to 6 months, but would continue Brilinta at 90 mg for 1 year and then reduced to 60 mg/convert to Plavix 75 mg after 1 year.[name]  He will follow-up with Dr. Chilton Si.   Bryan Lemma, M.D., M.S. Interventional Cardiologist       _____________   History of Present Illness     68 y.o.malewith CAD (PCI of LAD 2003, PDA/LCx in 2007), DM, CKD III by labs. HTN, HLD, arthritis, GERD, morbid obesity was recently seen in the office to establish care  on 07/26/18 with symptoms concerning for unstable angina. Pre-cath labs showed continued renal insufficiency so it was suggested to hold lisinopril 2 days prior to cath. He was admitted 07/29/2018 for planned procedure.  Hospital Course     1. Unstable angina/CAD - cath showed 85% ostial LAD just prior to previous stent, treated with DES overlapping previous stent. Otherwise nonobstructive disease noted. EF 55-65%, LVEDP normal. Per Dr. Herbie Baltimore, "Recommend dual antiplatelet therapy with Aspirin  daily and Ticagrelor  twice dailylong-term  (beyond 12 months) because of Ostial LAD stent. Would be okay to stop aspirin after 3 to 6 months, but would continue Brilinta at 90 mg for 1 year and then reduced to 60 mg/convert to Plavix 75 mg after 1 year." Ultimate decision making will be up to Dr. Duke Salvia about duration. Atorvastatin was continued (recent LDL 59). Metoprolol was stopped as below  2. Intermittent type 2 second degree AV block - this was initially noted the afternoon after cath. He was noted to have several episodes of Mobitz 2nd degree AV block type 2, with dropping up to 2 beats at a time. This was also noted while sleeping. He had taken his metoprolol at home prior to cath, but it was held once this was recognized. TSH was normal. He was totally asymptomatic. He wished to go home day after cath but it was recommended he stay for continued observation for one more night. After d/c of metoprolol, he only had episode overnight while sleeping. Dr. Ladona Ridgel reviewed strips and case and felt no specific intervention was needed at this time and recommended watchful waiting for any symptoms to correlate. He did not feel event monitoring was necessary. Patient was advised to avoid HR-slowing medicines and call if any new symptoms. He denied h/o presyncope or syncope.  3. CKD stage III - Previously 1.24 in 11/2017. It was 1.57 prior to cath, and 1.45 post-cath. Dr. Duke Salvia recommended to decrease lisinopril to  daily and add amlodipine  daily to accommodate BP control since BB was also stopped. He was advised to d/c NSAIDS. Lab order was entered for him to return Tuesday 9/10 to lab for BMET.  4. HTN -changes made as above. Likely also driven by #5.  5. Morbid obesity/suspected OSA - pending OP sleep study. Discussed role of weight in management.  6. DM - he was advised that he can resume Metformin tomorrow.  The patient feels well this morning. Dr. Duke Salvia has seen and examined the patient today and feels he is stable for  discharge. He has a f/u appointment in 2 months but also needs a post cath check within 2 weeks. I have sent a message to our office's scheduling team requesting a follow-up appointment, and our office will call the patient with this information. His usual [facility] is closed today but he will need to pick up rx's for Brilinta and SL NTG today - per d/w pt/wife, will [facility]. He will get amlodipine/lisinopril prior to DC today so we will route these to [facility]. Also received handwritten 30day free Brilinta rx.  Doing very well.  Chest pain and left arm pain has resolved.  Still with mild sob with activity.  EF on cath was reportedly normal.  Has not had a CXR.  Denies cough, pleurisy.  Does have a history of pulmonary nodule on CT from 2013 but patient never went for follow up CT.  Recent HgA1c was 7.7.  He has tried to eat better and his  FBS is 99-120.  He is not checking postprandial sugars.  At that time, my plan was:  Obtain CXR given sob but I suspect dyspnea is due to deconditioning.  Already on dual antiplatelet agents, high intensity statin, beta blocker but glycemic control is suspect.  Recommend FBS and 2 hr PPS be recorded and reviewed in 2 weeks.  Recommended gradually increasing exercise to improve deconditioning.  Recommended 10-20 lbs of weight loss. Recheck fasting labs in 3 months.    10/24/18 Patient is not exercising regularly.  He is checking his blood sugars and his fasting blood sugars have been between 100 and and 140.  His 2-hour postprandial sugars have been between 120 and 160.  I am very happy about this however he stopped checking them approximately 6 weeks ago.  Therefore we have no data over the last month and a half to see how his sugars been doing.  His blood pressure today is well controlled at 122/60.  He denies any chest pain although he does report shortness of breath with exertion consistent with deconditioning.  He denies any orthopnea.  He  denies any paroxysmal nocturnal dyspnea.  He denies any nausea or vomiting or diarrhea. Wt Readings from Last 3 Encounters:  08/21/19 272 lb (123.4 kg)  01/23/19 280 lb (127 kg)  01/18/19 280 lb (127 kg)   He has not lost substantial weight since his last visit.  He is not monitoring his diet.  He denies any polyuria, polydipsia, or blurry vision.  He denies any numbness or tingling in his feet.  He denies any dysesthesias or paresthesias in his feet.  He denies any myalgias or right upper quadrant pain.  He denies any melena or hematochezia  At that time, my plan was: Blood pressure today is well controlled.  I emphasized to the patient that to prevent future problems with his heart, we need to control his risk factors aggressively.  His risk factors include his sedentary lifestyle, his diet, his blood pressure, his diabetes, and his cholesterol.  I am very happy with his blood pressure.  His last blood sugars were excellent.  I will check a hemoglobin A1c.  Ideally I would like his hemoglobin A1c to be less than 7.  I will also check a fasting lipid panel.  Ideally his LDL cholesterol be less than 70.  He is currently taking aspirin and Brilinta.  Therefore I encourage the patient that the best place he can make positive change his in changing his lifestyle.  I encouraged 30 minutes of aerobic exercise 5 days a week.  I encouraged a diet low in carbohydrates and low in saturated fat.  I recommended 30 to 40 pounds weight loss gradually.  If he makes these changes, I believe he can add quality years to his life and have an improved quality of life.  I would recheck the patient's fasting lab work in 6 months barring any unforeseen complications on this lab work  01/23/19 Since I last saw the patient, he has lost 1 pound.  He has not changed his diet.  He admits that he is still eating little Debbie cakes.  He is not engaging in any vigorous aerobic exercise.  He is developed a burning stinging pain in his  feet primarily at night.  They also feel cold.  Diabetic foot exam is performed today and is normal.  He has excellent pulses in both feet however I believe he is developing dysesthesias and paresthesias in his feet due  to diabetic neuropathy.  He states that his fasting blood sugars in the morning are 1 30-1 60.  He is not checking 2-hour postprandial sugars.  He has a diabetic eye exam scheduled for March.  He denies any chest pain shortness of breath or dyspnea on exertion.  He does report a pill-rolling tremor in either hand that occurs when he is at rest that extinguishes with activity.  However he also reports an essential tremor that is worse with focus and activity.  He denies any decreased arm swing with ambulation.  He denies any stumbling or falling.  He denies any masslike facies or decreased blink.  There is no cogwheel rigidity on his exam today.  He denies any clumsiness or loss of dexterity or short-term memory loss.  At that time, my plan was: Physical exam today is normal aside from obesity.  I recommended decreasing his consumption of junk food.  I recommended 30 minutes a day of aerobic exercise.  I believe the pain that he is developing in his feet at night is diabetic neuropathy.  He will start gabapentin 300 mg p.o. nightly and can increase to 3 times a day if necessary.  Diabetic eye exam is scheduled for March.  Blood pressure today is outstanding.  I will check a hemoglobin A1c.  Goal hemoglobin A1c is less than 7.  Patient likely will require more insulin to better manage his blood sugars based on his dietary indiscretion but I will await the results of his lab work.  Check a fasting lipid panel.  Goal LDL cholesterol is less than 70.  We will clinically monitor the tremor for now.  The pill-rolling tremor is concerning but he denies any other symptoms of Parkinson's disease.  If the tremor worsens, will consult neurology.  08/21/19 Patient is here today for follow-up of his diabetes.   He states that his blood sugars in the morning vary between 130 and 150.  He states that his blood sugars in the evening are typically under 160.  He denies any hypoglycemic episodes.  He denies any polyuria, polydipsia, or blurry vision.  He denies any chest pain shortness of breath or dyspnea on exertion.  Patient does have numbness in his feet.  For instance today on his diabetic foot exam, the patient is bleeding from his left fifth toe.  He cut the distal portion of his toe with a pair of toenail clippers and did not even realize it.  He has bled substantially from this.  The tips of his toes are completely numb.  He does have good blood flow with 2/4 dorsalis pedis pulses bilaterally and posterior tibialis pulses bilaterally.  He denies any myalgias or right upper quadrant pain. Past Medical History:  Diagnosis Date   Arthritis    "all over" (07/29/2018)   CAD S/P percutaneous coronary angioplasty    a. stent to mLAD 11/2001. b. DES to PDA/mLCx 2007. c. DES to ostial LAD 07/2018.   Chronic lower back pain    CKD (chronic kidney disease), stage III (HCC)    CKD (chronic kidney disease), stage III (HCC)    Diabetic retinopathy (HCC)    GERD (gastroesophageal reflux disease)    High cholesterol    History of kidney stones    Hypertension    Mobitz type 2 second degree atrioventricular block    a. noted during 07/2018 adm, metoprolol discontinued.   Morbid obesity ([place]) 07/28/2018   Prostatitis    Pulmonary nodule 2013   CT  Sleep apnea    stopbang=5   Suspected sleep apnea    Type II diabetes mellitus (HCC)    Past Surgical History:  Procedure Laterality Date   BACK SURGERY     CARDIAC CATHETERIZATION  JUNE 2003   PATENT LAD STENT/ BODERLINE OBSTRUCTIVE DISEASE POSTERIOR DESCENDING ARTERY/ NORMAL LVF   CATARACT EXTRACTION W/ INTRAOCULAR LENS  IMPLANT, BILATERAL Bilateral 2017   CERVICAL SCOVILLE FORAMINOTOMY W/ EXCISION OF HERNIATED NUCLEC PULPOSUS  2009   C6 - 7     CORONARY ANGIOPLASTY WITH STENT PLACEMENT  03/30/2006   DR EDMUNDS - 90% LCx@OM2  TAXUS  DES 3.0 X 20 -> 3.25 mm,   95% RPDA -- TAXUS DES 3.0 X 16 --> 3.5 mm   CORONARY ANGIOPLASTY WITH STENT PLACEMENT  11/2001   (Dr. Ty Hilts for Dr. Mayford Knife) - mLAD@D2  - TAXUS EXPRESS DES 3.5 x 15    CORONARY ANGIOPLASTY WITH STENT PLACEMENT  07/29/2018   CORONARY STENT INTERVENTION N/A 07/29/2018   Procedure: CORONARY STENT INTERVENTION;  Surgeon: Marykay Lex, MD;  Location: [facility];  Service: Cardiovascular;  Laterality: N/A;   CYSTOSCOPY W/ RETROGRADES  05/04/2012   Procedure: CYSTOSCOPY WITH RETROGRADE PYELOGRAM;  Surgeon: Milford Cage, MD;  Location: [facility];  Service: Urology;  Laterality: Left;   CYSTOSCOPY W/ URETERAL STENT PLACEMENT  05/04/2012   Procedure: CYSTOSCOPY WITH STENT REPLACEMENT;  Surgeon: Milford Cage, MD;  Location: [facility];  Service: Urology;  Laterality: Left;   CYSTOSCOPY WITH STENT PLACEMENT Left 04/04/2012   CYSTOSCOPY WITH URETEROSCOPY  05/04/2012   Procedure: CYSTOSCOPY WITH URETEROSCOPY;  Surgeon: Milford Cage, MD;  Location: [facility];  Service: Urology;  Laterality: Left;       LEFT HEART CATH AND CORONARY ANGIOGRAPHY N/A 07/29/2018   Procedure: LEFT HEART CATH AND CORONARY ANGIOGRAPHY;  Surgeon: Marykay Lex, MD;  [facility]: [facility];  Service: Cardiovascular;  Laterality: N/A;   LEFT URETEROSCOPIC STONE EXTRACTION  03-14-2003   X2   Current Outpatient Medications on File Prior to Visit  Medication Sig Dispense Refill   amLODipine (NORVASC) 10 MG tablet Take 1 tablet (10 mg total) by mouth daily. 90 tablet 3   aspirin 81 MG tablet Take 81 mg by mouth daily.     atorvastatin (LIPITOR) 80 MG tablet Take 1 tablet by mouth once daily 90 tablet 0   BD ULTRA-FINE PEN NEEDLES 29G X 12.7MM MISC USE DAILY WITH VICTOZA 100 each 3   gabapentin (NEURONTIN) 300 MG capsule TAKE 1 CAPSULE BY MOUTH THREE TIMES DAILY 90 capsule 3    glipiZIDE (GLUCOTROL) 10 MG tablet Take 1 tablet by mouth once daily 90 tablet 0   glucose blood (ACCU-CHEK AVIVA PLUS) test strip Use as instructed to monitor FSBS 4x daily due to fluctuating blood glucose levels. Dx: E11.65. 100 each 12   HYDROcodone-acetaminophen (NORCO) 5-325 MG tablet Take 1 tablet by mouth every 6 (six) hours as needed for moderate pain. 30 tablet 0   Insulin Glargine (LANTUS SOLOSTAR) 100 UNIT/ML Solostar Pen Inject 32 Units into the skin daily. INJECT 32 UNITS UNDER THE SKIN EVERY MORNIN 30 mL 3   Insulin Pen Needle (SURE COMFORT PEN NEEDLES) 32G X 6 MM MISC Use daily with Victoza 100 each 5   JARDIANCE 25 MG TABS tablet Take 1 tablet by mouth once daily 90 tablet 0   lisinopril (PRINIVIL,ZESTRIL) 20 MG tablet Take 1 tablet (20 mg total) by mouth daily. 90 tablet 3   metFORMIN (  GLUCOPHAGE) 1000 MG tablet TAKE 1 TABLET BY MOUTH TWICE DAILY WITH A MEAL 180 tablet 0   pantoprazole (PROTONIX) 40 MG tablet Take 1 tablet by mouth once daily 90 tablet 0   ticagrelor (BRILINTA) 90 MG TABS tablet Take 1 tablet (90 mg total) by mouth 2 (two) times daily. 180 tablet 1   VICTOZA 18 MG/3ML SOPN INJECT 0.2 MLS (1.2 MG TOTAL) INTO THE SKIN DAILY 18 mL 3   insulin lispro (HUMALOG KWIKPEN) 100 UNIT/ML KwikPen Inject 0.05 mLs (5 Units total) into the skin 2 (two) times daily. With lunch and supper 3 mL 2   nitroGLYCERIN (NITROSTAT) 0.4 MG SL tablet Place 1 tablet (0.4 mg total) under the tongue every 5 (five) minutes as needed for chest pain (up to 3 doses. If taking 3rd dose call 911). 25 tablet 3   No current facility-administered medications on file prior to visit.    Allergies  Allergen Reactions   Beta Adrenergic Blockers     Metoprolol stopped 07/2018 due to 2nd degree type 2 AV block.   Social History   Socioeconomic History   Marital status: Married    Spouse name: Not on file   Number of children: Not on file   Years of education: Not on file   Highest  education level: Not on file  Occupational History   Not on file  Social Needs   Financial resource strain: Not on file   Food insecurity    Worry: Not on file    Inability: Not on file   Transportation needs    Medical: Not on file    Non-medical: Not on file  Tobacco Use   Smoking status: Never Smoker   Smokeless tobacco: Never Used  Substance and Sexual Activity   Alcohol use: Never    Frequency: Never   Drug use: Never   Sexual activity: Not Currently  Lifestyle   Physical activity    Days per week: Not on file    Minutes per session: Not on file   Stress: Not on file  Relationships   Social connections    Talks on phone: Not on file    Gets together: Not on file    Attends religious service: Not on file    Active member of club or organization: Not on file    Attends meetings of clubs or organizations: Not on file    Relationship status: Not on file   Intimate partner violence    Fear of current or ex partner: Not on file    Emotionally abused: Not on file    Physically abused: Not on file    Forced sexual activity: Not on file  Other Topics Concern   Not on file  Social History Narrative   Not on file      Review of Systems  All other systems reviewed and are negative.      Objective:   Physical Exam  Constitutional: He appears well-developed and well-nourished.  Neck: Neck supple. No JVD present. No thyromegaly present.  Cardiovascular: Normal rate, regular rhythm, normal heart sounds and intact distal pulses.  No murmur heard. Pulmonary/Chest: Effort normal and breath sounds normal. No respiratory distress. He has no wheezes. He has no rales. He exhibits no tenderness.  Abdominal: Soft. Bowel sounds are normal. He exhibits no distension and no mass. There is no abdominal tenderness. There is no rebound and no guarding.  Musculoskeletal:        General: No edema.  Lymphadenopathy:  He has no cervical adenopathy.  Skin: No rash  noted. No erythema.  Vitals reviewed.         Assessment & Plan:   Controlled type 2 diabetes mellitus with complication, without long-term current use of insulin (HCC) - Plan: Hemoglobin A1c, CBC with Differential/Platelet, COMPLETE METABOLIC PANEL WITH GFR, Lipid panel, Microalbumin, urine  CAD S/P percutaneous coronary angioplasty  Essential hypertension  Pure hypercholesterolemia  Since his last visit, the patient has lost 8 pounds.  I congratulated him on that.  His blood pressures well controlled at 110/60.  I will check a hemoglobin A1c.  Goal hemoglobin A1c is less than 7.  I will also screen for microalbumin.  Given his history of coronary artery disease, his goal LDL cholesterol is less than 70.  Diabetic foot exam was performed today and is abnormal in that the patient's toes are numb due to diabetic neuropathy.  His flu shot and pneumonia shot are up-to-date.  He continues to endorse an essential tremor.  He is unable to take a beta-blocker due to second-degree AV block.  Therefore his next option would be primidone.  Patient declines this at the present time.  More than 25 minutes was spent today with the patient in discussion

## 2019-08-22 LAB — COMPLETE METABOLIC PANEL WITH GFR
AG Ratio: 1.7 (calc) (ref 1.0–2.5)
ALT: 26 U/L (ref 9–46)
AST: 23 U/L (ref 10–35)
Albumin: 4.3 g/dL (ref 3.6–5.1)
Alkaline phosphatase (APISO): 104 U/L (ref 35–144)
BUN: 15 mg/dL (ref 7–25)
CO2: 21 mmol/L (ref 20–32)
Calcium: 9.5 mg/dL (ref 8.6–10.3)
Chloride: 105 mmol/L (ref 98–110)
Creat: 1.19 mg/dL (ref 0.70–1.25)
GFR, Est African American: 73 mL/min/{1.73_m2} (ref >=60)
GFR, Est Non African American: 63 mL/min/{1.73_m2} (ref >=60)
Globulin: 2.6 g/dL (calc) (ref 1.9–3.7)
Glucose, Bld: 188 mg/dL — ABNORMAL HIGH (ref 65–99)
Potassium: 4.6 mmol/L (ref 3.5–5.3)
Sodium: 138 mmol/L (ref 135–146)
Total Bilirubin: 0.6 mg/dL (ref 0.2–1.2)
Total Protein: 6.9 g/dL (ref 6.1–8.1)

## 2019-08-22 LAB — CBC WITH DIFFERENTIAL/PLATELET
Absolute Monocytes: 664 cells/uL (ref 200–950)
Basophils Absolute: 51 cells/uL (ref 0–200)
Basophils Relative: 0.7 %
Eosinophils Absolute: 453 cells/uL (ref 15–500)
Eosinophils Relative: 6.2 %
HCT: 41.8 % (ref 38.5–50.0)
Hemoglobin: 13.3 g/dL (ref 13.2–17.1)
Lymphs Abs: 1796 cells/uL (ref 850–3900)
MCH: 27 pg (ref 27.0–33.0)
MCHC: 31.8 g/dL — ABNORMAL LOW (ref 32.0–36.0)
MCV: 84.8 fL (ref 80.0–100.0)
MPV: 8.7 fL (ref 7.5–12.5)
Monocytes Relative: 9.1 %
Neutro Abs: 4336 cells/uL (ref 1500–7800)
Neutrophils Relative %: 59.4 %
Platelets: 316 10*3/uL (ref 140–400)
RBC: 4.93 10*6/uL (ref 4.20–5.80)
RDW: 14.6 % (ref 11.0–15.0)
Total Lymphocyte: 24.6 %
WBC: 7.3 10*3/uL (ref 3.8–10.8)

## 2019-08-22 LAB — LIPID PANEL
Cholesterol: 89 mg/dL (ref <200)
HDL: 52 mg/dL (ref >=40)
LDL Cholesterol (Calc): 22 mg/dL (calc)
Non-HDL Cholesterol (Calc): 37 mg/dL (calc) (ref <130)
Total CHOL/HDL Ratio: 1.7 (calc) (ref <5.0)
Triglycerides: 72 mg/dL (ref <150)

## 2019-08-22 LAB — HEMOGLOBIN A1C
Hgb A1c MFr Bld: 8.1 % of total Hgb — ABNORMAL HIGH (ref <5.7)
Mean Plasma Glucose: 186 (calc)
eAG (mmol/L): 10.3 (calc)

## 2019-08-22 LAB — MICROALBUMIN, URINE: Microalb, Ur: 0.5 mg/dL

## 2019-08-23 ENCOUNTER — Other Ambulatory Visit: Payer: Self-pay | Admitting: Family Medicine

## 2019-08-23 DIAGNOSIS — IMO0002 Reserved for concepts with insufficient information to code with codable children: Secondary | ICD-10-CM

## 2019-08-23 DIAGNOSIS — E1165 Type 2 diabetes mellitus with hyperglycemia: Secondary | ICD-10-CM

## 2019-08-23 NOTE — Progress Notes (Signed)
refe

## 2019-08-24 ENCOUNTER — Telehealth: Payer: Self-pay | Admitting: *Deleted

## 2019-08-24 NOTE — Telephone Encounter (Signed)
Received verbal orders for Cologuard.   Order placed via [facility].   Cologuard (Order [id number])

## 2019-08-24 NOTE — Telephone Encounter (Signed)
-----   Message from Alyson Locket, [place] sent at 08/23/2019  2:15 PM EDT ----- Order cologuard please

## 2019-09-07 LAB — COLOGUARD

## 2019-09-15 NOTE — Telephone Encounter (Signed)
We have received the results of your Cologuard screening.   Your screening is negative.   A negative result indicates a low likelihood of colorectal cancer is present. Following a negative Cologuard result, the American Cancer Society recommends a Cologuard re-screening interval of 3 years.   Letter sent.

## 2019-10-04 ENCOUNTER — Other Ambulatory Visit: Payer: Self-pay | Admitting: Family Medicine

## 2019-10-16 ENCOUNTER — Other Ambulatory Visit: Payer: Self-pay

## 2019-10-18 ENCOUNTER — Other Ambulatory Visit: Payer: Self-pay

## 2019-10-18 ENCOUNTER — Ambulatory Visit (INDEPENDENT_AMBULATORY_CARE_PROVIDER_SITE_OTHER): Payer: Medicare Other | Admitting: Endocrinology

## 2019-10-18 ENCOUNTER — Encounter: Payer: Self-pay | Admitting: Endocrinology

## 2019-10-18 VITALS — BP 140/70 | HR 64 | Ht 69.5 in | Wt 277.8 lb

## 2019-10-18 DIAGNOSIS — Z794 Long term (current) use of insulin: Secondary | ICD-10-CM | POA: Diagnosis not present

## 2019-10-18 DIAGNOSIS — E1122 Type 2 diabetes mellitus with diabetic chronic kidney disease: Secondary | ICD-10-CM

## 2019-10-18 DIAGNOSIS — E1165 Type 2 diabetes mellitus with hyperglycemia: Secondary | ICD-10-CM

## 2019-10-18 DIAGNOSIS — N1831 Chronic kidney disease, stage 3a: Secondary | ICD-10-CM | POA: Diagnosis not present

## 2019-10-18 DIAGNOSIS — E1121 Type 2 diabetes mellitus with diabetic nephropathy: Secondary | ICD-10-CM

## 2019-10-18 LAB — POCT GLYCOSYLATED HEMOGLOBIN (HGB A1C): Hemoglobin A1C: 7.4 % — AB (ref 4.0–5.6)

## 2019-10-18 MED ORDER — METFORMIN HCL 1000 MG PO TABS: 1000.0000 mg | ORAL_TABLET | Freq: Every day | ORAL | 3 refills | Status: DC

## 2019-10-18 MED ORDER — INSULIN LISPRO (1 UNIT DIAL) 100 UNIT/ML (KWIKPEN): 8.0000 [IU] | PEN_INJECTOR | Freq: Two times a day (BID) | SUBCUTANEOUS | 2 refills | Status: DC

## 2019-10-18 NOTE — Patient Instructions (Addendum)
good diet and exercise significantly improve the control of your diabetes.  please let me know if you wish to be referred to a dietician.  high blood sugar is very risky to your health.  you should see an eye doctor and dentist every year.  It is very important to get all recommended vaccinations.  check your blood sugar twice a day.  vary the time of day when you check, between before the 3 meals, and at bedtime.  also check if you have symptoms of your blood sugar being too high or too low.  please keep a record of the readings and bring it to your next appointment here (or you can bring the meter itself).  You can write it on any piece of paper.  please call us sooner if your blood sugar goes below 70, or if you have a lot of readings over 200. We will need to take this complex situation in stages For now, please: Reduce the metformin to the morning only, and: Increase the humalog to 8 units with breakfast and supper, and: Please continue the same other diabetes medications for now.   Please come back for a follow-up appointment in 2 months.

## 2019-10-18 NOTE — Progress Notes (Signed)
Subjective:    Patient ID: Erik Hancock, male    DOB: 1951-08-04, 68 y.o.   MRN: [id number]  HPI pt is referred by Dr Dennard Schaumann, for diabetes.  Pt states DM was dx'ed in 2002; he has moderate neuropathy of the lower extremities; he has associated DR, CAD, and renal insuff; he has been on insulin since 2018; pt says his diet and exercise are not good; he has never had pancreatitis, pancreatic surgery, severe hypoglycemia or DKA.  He takes Lantus (32 units qam), Humalog (5-BID), victoza, and 3 oral meds.  He brings his meter with his cbg's which I have reviewed today.  cbg varies from 104-273.  It is in general higher as the day goes on Past Medical History:  Diagnosis Date  . Arthritis    "all over" (07/29/2018)  . CAD S/P percutaneous coronary angioplasty    a. stent to mLAD 11/2001. b. DES to PDA/mLCx 2007. c. DES to ostial LAD 07/2018.  [name] Chronic lower back pain   . CKD (chronic kidney disease), stage III   . CKD (chronic kidney disease), stage III   . Diabetic retinopathy ([place])   . GERD (gastroesophageal reflux disease)   . High cholesterol   . History of kidney stones   . Hypertension   . Mobitz type 2 second degree atrioventricular block    a. noted during 07/2018 adm, metoprolol discontinued.  . Morbid obesity ([place]) 07/28/2018  . Prostatitis   . Pulmonary nodule 2013   CT  . Sleep apnea    stopbang=5  . Suspected sleep apnea   . Type II diabetes mellitus ([place])     Past Surgical History:  Procedure Laterality Date  . BACK SURGERY    . CARDIAC CATHETERIZATION  JUNE 2003   PATENT LAD STENT/ BODERLINE OBSTRUCTIVE DISEASE POSTERIOR DESCENDING ARTERY/ NORMAL LVF  . CATARACT EXTRACTION W/ INTRAOCULAR LENS  IMPLANT, BILATERAL Bilateral 2017  . CERVICAL SCOVILLE FORAMINOTOMY W/ EXCISION OF HERNIATED NUCLEC PULPOSUS  2009   C6 - 7  . CORONARY ANGIOPLASTY WITH STENT PLACEMENT  03/30/2006   DR EDMUNDS - 90% LCx@OM2  TAXUS  DES 3.0 X 20 -> 3.25 mm,   95% RPDA -- TAXUS DES 3.0 X 16 --> 3.5  mm  . CORONARY ANGIOPLASTY WITH STENT PLACEMENT  11/2001   (Dr. Ilda Foil for Dr. Radford Pax) - mLAD@D2  - TAXUS EXPRESS DES 3.5 x 15   . CORONARY ANGIOPLASTY WITH STENT PLACEMENT  07/29/2018  . CORONARY STENT INTERVENTION N/A 07/29/2018   Procedure: CORONARY STENT INTERVENTION;  Surgeon: Leonie Man, MD;  Location: [facility];  Service: Cardiovascular;  Laterality: N/A;  . CYSTOSCOPY W/ RETROGRADES  05/04/2012   Procedure: CYSTOSCOPY WITH RETROGRADE PYELOGRAM;  Surgeon: Molli Hazard, MD;  Location: [facility];  Service: Urology;  Laterality: Left;  . CYSTOSCOPY W/ URETERAL STENT PLACEMENT  05/04/2012   Procedure: CYSTOSCOPY WITH STENT REPLACEMENT;  Surgeon: Molli Hazard, MD;  Location: [facility];  Service: Urology;  Laterality: Left;  . CYSTOSCOPY WITH STENT PLACEMENT Left 04/04/2012  . CYSTOSCOPY WITH URETEROSCOPY  05/04/2012   Procedure: CYSTOSCOPY WITH URETEROSCOPY;  Surgeon: Molli Hazard, MD;  Location: [facility];  Service: Urology;  Laterality: Left;      . LEFT HEART CATH AND CORONARY ANGIOGRAPHY N/A 07/29/2018   Procedure: LEFT HEART CATH AND CORONARY ANGIOGRAPHY;  Surgeon: Leonie Man, MD;  Location: [facility];  Service: Cardiovascular;  Laterality: N/A;  . LEFT URETEROSCOPIC STONE EXTRACTION  03-14-2003  X2    Social History   Socioeconomic History  . Marital status: Married    Spouse name: Not on file  . Number of children: Not on file  . Years of education: Not on file  . Highest education level: Not on file  Occupational History  . Not on file  Social Needs  . Financial resource strain: Not on file  . Food insecurity    Worry: Not on file    Inability: Not on file  . Transportation needs    Medical: Not on file    Non-medical: Not on file  Tobacco Use  . Smoking status: Never Smoker  . Smokeless tobacco: Never Used  Substance and Sexual Activity  . Alcohol use: Never    Frequency: Never  . Drug use: Never  . Sexual activity:  Not Currently  Lifestyle  . Physical activity    Days per week: Not on file    Minutes per session: Not on file  . Stress: Not on file  Relationships  . Social Musician on phone: Not on file    Gets together: Not on file    Attends religious service: Not on file    Active member of club or organization: Not on file    Attends meetings of clubs or organizations: Not on file    Relationship status: Not on file  . Intimate partner violence    Fear of current or ex partner: Not on file    Emotionally abused: Not on file    Physically abused: Not on file    Forced sexual activity: Not on file  Other Topics Concern  . Not on file  Social History Narrative  . Not on file    Current Outpatient Medications on File Prior to Visit  Medication Sig Dispense Refill  . amLODipine (NORVASC) 10 MG tablet Take 1 tablet (10 mg total) by mouth daily. 90 tablet 3  . aspirin 81 MG tablet Take 81 mg by mouth daily.    [name] atorvastatin (LIPITOR) 80 MG tablet Take 1 tablet by mouth once daily 90 tablet 0  . BD ULTRA-FINE PEN NEEDLES 29G X 12.7MM MISC USE DAILY WITH VICTOZA 100 each 3  . gabapentin (NEURONTIN) 300 MG capsule TAKE 1 CAPSULE BY MOUTH THREE TIMES DAILY 90 capsule 3  . glipiZIDE (GLUCOTROL) 10 MG tablet Take 1 tablet by mouth once daily 90 tablet 0  . glucose blood (ACCU-CHEK AVIVA PLUS) test strip Use as instructed to monitor FSBS 4x daily due to fluctuating blood glucose levels. Dx: E11.65. 100 each 12  . HYDROcodone-acetaminophen (NORCO) 5-325 MG tablet Take 1 tablet by mouth every 6 (six) hours as needed for moderate pain. 30 tablet 0  . Insulin Glargine (LANTUS SOLOSTAR) 100 UNIT/ML Solostar Pen Inject 32 Units into the skin daily. INJECT 32 UNITS UNDER THE SKIN EVERY MORNIN 30 mL 3  . Insulin Pen Needle (SURE COMFORT PEN NEEDLES) 32G X 6 MM MISC Use daily with Victoza 100 each 5  . JARDIANCE 25 MG TABS tablet Take 1 tablet by mouth once daily 90 tablet 0  . lisinopril  (PRINIVIL,ZESTRIL) 20 MG tablet Take 1 tablet (20 mg total) by mouth daily. 90 tablet 3  . pantoprazole (PROTONIX) 40 MG tablet Take 1 tablet by mouth once daily 90 tablet 0  . ticagrelor (BRILINTA) 90 MG TABS tablet Take 1 tablet (90 mg total) by mouth 2 (two) times daily. 180 tablet 1  . VICTOZA 18 MG/3ML SOPN  INJECT 0.2 MLS (1.2 MG TOTAL) INTO THE SKIN DAILY 18 mL 3  . nitroGLYCERIN (NITROSTAT) 0.4 MG SL tablet Place 1 tablet (0.4 mg total) under the tongue every 5 (five) minutes as needed for chest pain (up to 3 doses. If taking 3rd dose call 911). 25 tablet 3   No current facility-administered medications on file prior to visit.     Allergies  Allergen Reactions  . Beta Adrenergic Blockers     Metoprolol stopped 07/2018 due to 2nd degree type 2 AV block.    Family History  Problem Relation Age of Onset  . Hypertension Mother   . Heart attack Father   . Heart disease Father   . Alzheimer's disease Father   . Diabetes Father   . Heart attack Paternal Grandfather   . Heart disease Paternal Grandfather   . Lupus Sister     BP 140/70 (BP Location: Left Arm, Patient Position: Sitting, Cuff Size: Normal)   Pulse 64   Ht 5' 9.5" (1.765 m)   Wt 277 lb 12.8 oz (126 kg)   SpO2 94%   BMI 40.44 kg/m   Review of Systems denies recent weight loss, headache, chest pain, sob, n/v, excessive diaphoresis, memory loss, hypoglycemia, depression, cold intolerance, and rhinorrhea.   He has slight blurry vision, frequent urination, leg cramps, easy bruising, and fatigue.      Objective:   Physical Exam VS: see vs page GEN: no distress HEAD: head: no deformity eyes: no periorbital swelling, no proptosis external nose and ears are normal NECK: supple, thyroid is not enlarged CHEST WALL: no deformity LUNGS: clear to auscultation CV: reg rate and rhythm, no murmur ABD: abdomen is soft, nontender.  no hepatosplenomegaly.  not distended.  Self-reducing ventral hernia MUSCULOSKELETAL: muscle  bulk and strength are grossly normal.  no obvious joint swelling.  gait is normal and steady EXTEMITIES: no deformity.  no ulcer on the feet.  feet are of normal color and temp.  1+ bilat leg edema PULSES: dorsalis pedis intact bilat.  no carotid bruit NEURO:  cn 2-12 grossly intact.   readily moves all 4's.  sensation is intact to touch on the feet, but decreased from normal.  SKIN:  Normal texture and temperature.  No rash or suspicious lesion is visible.   NODES:  None palpable at the neck PSYCH: alert, well-oriented.  Does not appear anxious nor depressed.    Lab Results  Component Value Date   HGBA1C 7.4 (A) 10/18/2019   Lab Results  Component Value Date   CREATININE 1.19 08/21/2019   BUN 15 08/21/2019   NA 138 08/21/2019   K 4.6 08/21/2019   CL 105 08/21/2019   CO2 21 08/21/2019   I have reviewed outside records, and summarized: Pt was noted to have elevated a1c, and referred here.  HTN and dyslipidemia were also addressed.      Assessment & Plan:  Insulin-requiring type 2 DM, with CAD: this is the best control this pt should aim for, given this regimen, which does match insulin to her changing needs throughout the day renal insuff, new to me: he should reduce metformin.   HTN: recheck next time.   Patient Instructions  good diet and exercise significantly improve the control of your diabetes.  please let me know if you wish to be referred to a dietician.  high blood sugar is very risky to your health.  you should see an eye doctor and dentist every year.  It is very important  to get all recommended vaccinations.  check your blood sugar twice a day.  vary the time of day when you check, between before the 3 meals, and at bedtime.  also check if you have symptoms of your blood sugar being too high or too low.  please keep a record of the readings and bring it to your next appointment here (or you can bring the meter itself).  You can write it on any piece of paper.  please call  us sooner if your blood sugar goes below 70, or if you have a lot of readings over 200. We will need to take this complex situation in stages For now, please: Reduce the metformin to the morning only, and: Increase the humalog to 8 units with breakfast and supper, and: Please continue the same other diabetes medications for now.   Please come back for a follow-up appointment in 2 months.

## 2019-10-30 ENCOUNTER — Encounter (INDEPENDENT_AMBULATORY_CARE_PROVIDER_SITE_OTHER): Payer: Medicare Other | Admitting: Ophthalmology

## 2019-10-30 ENCOUNTER — Other Ambulatory Visit: Payer: Self-pay

## 2019-10-30 DIAGNOSIS — E113312 Type 2 diabetes mellitus with moderate nonproliferative diabetic retinopathy with macular edema, left eye: Secondary | ICD-10-CM

## 2019-10-30 DIAGNOSIS — I10 Essential (primary) hypertension: Secondary | ICD-10-CM

## 2019-10-30 DIAGNOSIS — E113391 Type 2 diabetes mellitus with moderate nonproliferative diabetic retinopathy without macular edema, right eye: Secondary | ICD-10-CM

## 2019-10-30 DIAGNOSIS — E11311 Type 2 diabetes mellitus with unspecified diabetic retinopathy with macular edema: Secondary | ICD-10-CM

## 2019-10-30 DIAGNOSIS — H35033 Hypertensive retinopathy, bilateral: Secondary | ICD-10-CM

## 2019-10-30 DIAGNOSIS — H43813 Vitreous degeneration, bilateral: Secondary | ICD-10-CM

## 2019-12-01 ENCOUNTER — Other Ambulatory Visit: Payer: Self-pay | Admitting: Family Medicine

## 2019-12-11 ENCOUNTER — Encounter (INDEPENDENT_AMBULATORY_CARE_PROVIDER_SITE_OTHER): Payer: Medicare Other | Admitting: Ophthalmology

## 2019-12-11 DIAGNOSIS — H35033 Hypertensive retinopathy, bilateral: Secondary | ICD-10-CM

## 2019-12-11 DIAGNOSIS — E11311 Type 2 diabetes mellitus with unspecified diabetic retinopathy with macular edema: Secondary | ICD-10-CM

## 2019-12-11 DIAGNOSIS — I10 Essential (primary) hypertension: Secondary | ICD-10-CM

## 2019-12-11 DIAGNOSIS — E113313 Type 2 diabetes mellitus with moderate nonproliferative diabetic retinopathy with macular edema, bilateral: Secondary | ICD-10-CM

## 2019-12-11 DIAGNOSIS — H43813 Vitreous degeneration, bilateral: Secondary | ICD-10-CM

## 2019-12-14 DIAGNOSIS — H40023 Open angle with borderline findings, high risk, bilateral: Secondary | ICD-10-CM | POA: Diagnosis not present

## 2019-12-14 DIAGNOSIS — H40052 Ocular hypertension, left eye: Secondary | ICD-10-CM | POA: Diagnosis not present

## 2019-12-27 ENCOUNTER — Ambulatory Visit: Payer: Medicare Other | Admitting: Endocrinology

## 2020-01-01 ENCOUNTER — Other Ambulatory Visit: Payer: Self-pay | Admitting: Cardiovascular Disease

## 2020-01-01 ENCOUNTER — Other Ambulatory Visit: Payer: Self-pay | Admitting: Family Medicine

## 2020-01-10 ENCOUNTER — Ambulatory Visit: Payer: Medicare PPO | Admitting: Endocrinology

## 2020-01-10 ENCOUNTER — Other Ambulatory Visit: Payer: Self-pay

## 2020-01-10 ENCOUNTER — Encounter: Payer: Self-pay | Admitting: Endocrinology

## 2020-01-10 VITALS — BP 144/64 | HR 77 | Ht 69.5 in | Wt 291.2 lb

## 2020-01-10 DIAGNOSIS — E1121 Type 2 diabetes mellitus with diabetic nephropathy: Secondary | ICD-10-CM | POA: Diagnosis not present

## 2020-01-10 DIAGNOSIS — Z794 Long term (current) use of insulin: Secondary | ICD-10-CM

## 2020-01-10 DIAGNOSIS — E1122 Type 2 diabetes mellitus with diabetic chronic kidney disease: Secondary | ICD-10-CM

## 2020-01-10 DIAGNOSIS — N1831 Chronic kidney disease, stage 3a: Secondary | ICD-10-CM

## 2020-01-10 DIAGNOSIS — E1165 Type 2 diabetes mellitus with hyperglycemia: Secondary | ICD-10-CM

## 2020-01-10 LAB — POCT GLYCOSYLATED HEMOGLOBIN (HGB A1C): Hemoglobin A1C: 8 % — AB (ref 4.0–5.6)

## 2020-01-10 MED ORDER — GLIPIZIDE 5 MG PO TABS: 5.0000 mg | ORAL_TABLET | Freq: Every day | ORAL | 11 refills | Status: DC

## 2020-01-10 MED ORDER — INSULIN LISPRO (1 UNIT DIAL) 100 UNIT/ML (KWIKPEN): 14.0000 [IU] | PEN_INJECTOR | Freq: Two times a day (BID) | SUBCUTANEOUS | 2 refills | Status: DC

## 2020-01-10 MED ORDER — GLIPIZIDE 10 MG PO TABS: 5.0000 mg | ORAL_TABLET | Freq: Every day | ORAL | 0 refills | Status: DC

## 2020-01-10 NOTE — Patient Instructions (Addendum)
check your blood sugar twice a day.  vary the time of day when you check, between before the 3 meals, and at bedtime.  also check if you have symptoms of your blood sugar being too high or too low.  please keep a record of the readings and bring it to your next appointment here (or you can bring the meter itself).  You can write it on any piece of paper.  please call us sooner if your blood sugar goes below 70, or if you have a lot of readings over 200. We will need to take this complex situation in stages.   For now, please:  Reduce the glipizide to 1/2 pill with breakfast, and:  Increase the humalog to 14 units with breakfast and supper, and:  Please continue the same other diabetes medications for now.   Please come back for a follow-up appointment in 2 months.

## 2020-01-10 NOTE — Progress Notes (Signed)
Subjective:    Patient ID: Erik Hancock, male    DOB: [birth year], 69 y.o.   MRN: [id number]  HPI Pt returns for f/u of diabetes mellitus: DM type: Insulin-requiring type 2 Dx'ed: 2002 Complications: PN, DR, CAD, and renal insuff Therapy: insulin since 2018, Victoza, and 3 oral meds DKA: never Severe hypoglycemia: never Pancreatitis: never Pancreatic imaging: normal on 2017 CT Other: he takes multiple daily injections Interval history: He brings a record of his cbg's which I have reviewed today.  He checks fasting only.  cbg varies from 111-206.  pt states he feels well in general. Past Medical History:  Diagnosis Date  . Arthritis    "all over" (07/29/2018)  . CAD S/P percutaneous coronary angioplasty    a. stent to mLAD 11/2001. b. DES to PDA/mLCx 2007. c. DES to ostial LAD 07/2018.  [name] Chronic lower back pain   . CKD (chronic kidney disease), stage III   . CKD (chronic kidney disease), stage III   . Diabetic retinopathy (HCC)   . GERD (gastroesophageal reflux disease)   . High cholesterol   . History of kidney stones   . Hypertension   . Mobitz type 2 second degree atrioventricular block    a. noted during 07/2018 adm, metoprolol discontinued.  . Morbid obesity (HCC) 07/28/2018  . Prostatitis   . Pulmonary nodule 2013   CT  . Sleep apnea    stopbang=5  . Suspected sleep apnea   . Type II diabetes mellitus (HCC)     Past Surgical History:  Procedure Laterality Date  . BACK SURGERY    . CARDIAC CATHETERIZATION  JUNE 2003   PATENT LAD STENT/ BODERLINE OBSTRUCTIVE DISEASE POSTERIOR DESCENDING ARTERY/ NORMAL LVF  . CATARACT EXTRACTION W/ INTRAOCULAR LENS  IMPLANT, BILATERAL Bilateral 2017  . CERVICAL SCOVILLE FORAMINOTOMY W/ EXCISION OF HERNIATED NUCLEC PULPOSUS  2009   C6 - 7  . CORONARY ANGIOPLASTY WITH STENT PLACEMENT  03/30/2006   DR EDMUNDS - 90% LCx@OM2  TAXUS  DES 3.0 X 20 -> 3.25 mm,   95% RPDA -- TAXUS DES 3.0 X 16 --> 3.5 mm  . CORONARY ANGIOPLASTY WITH STENT  PLACEMENT  11/2001   (Dr. Ty Hilts for Dr. Mayford Knife) - mLAD@D2  - TAXUS EXPRESS DES 3.5 x 15   . CORONARY ANGIOPLASTY WITH STENT PLACEMENT  07/29/2018  . CORONARY STENT INTERVENTION N/A 07/29/2018   Procedure: CORONARY STENT INTERVENTION;  Surgeon: Marykay Lex, MD;  Location: [facility] INVASIVE CV LAB;  Service: Cardiovascular;  Laterality: N/A;  . CYSTOSCOPY W/ RETROGRADES  05/04/2012   Procedure: CYSTOSCOPY WITH RETROGRADE PYELOGRAM;  Surgeon: Milford Cage, MD;  Location: [facility] ORS;  Service: Urology;  Laterality: Left;  . CYSTOSCOPY W/ URETERAL STENT PLACEMENT  05/04/2012   Procedure: CYSTOSCOPY WITH STENT REPLACEMENT;  Surgeon: Milford Cage, MD;  Location: [facility];  Service: Urology;  Laterality: Left;  . CYSTOSCOPY WITH STENT PLACEMENT Left 04/04/2012  . CYSTOSCOPY WITH URETEROSCOPY  05/04/2012   Procedure: CYSTOSCOPY WITH URETEROSCOPY;  Surgeon: Milford Cage, MD;  Location: [facility];  Service: Urology;  Laterality: Left;      . LEFT HEART CATH AND CORONARY ANGIOGRAPHY N/A 07/29/2018   Procedure: LEFT HEART CATH AND CORONARY ANGIOGRAPHY;  Surgeon: Marykay Lex, MD;  Location: [facility] INVASIVE CV LAB;  Service: Cardiovascular;  Laterality: N/A;  . LEFT URETEROSCOPIC STONE EXTRACTION  03-14-2003   X2    Social History   Socioeconomic History  . Marital status: Married    Spouse name:  Not on file  . Number of children: Not on file  . Years of education: Not on file  . Highest education level: Not on file  Occupational History  . Not on file  Tobacco Use  . Smoking status: Never Smoker  . Smokeless tobacco: Never Used  Substance and Sexual Activity  . Alcohol use: Never  . Drug use: Never  . Sexual activity: Not Currently  Other Topics Concern  . Not on file  Social History Narrative  . Not on file   Social Determinants of Health   Financial Resource Strain:   . Difficulty of Paying Living Expenses: Not on file  Food Insecurity:   . Worried About Brewing technologist in the Last Year: Not on file  . Ran Out of Food in the Last Year: Not on file  Transportation Needs:   . Lack of Transportation (Medical): Not on file  . Lack of Transportation (Non-Medical): Not on file  Physical Activity:   . Days of Exercise per Week: Not on file  . Minutes of Exercise per Session: Not on file  Stress:   . Feeling of Stress : Not on file  Social Connections:   . Frequency of Communication with Friends and Family: Not on file  . Frequency of Social Gatherings with Friends and Family: Not on file  . Attends Religious Services: Not on file  . Active Member of Clubs or Organizations: Not on file  . Attends Banker Meetings: Not on file  . Marital Status: Not on file  Intimate Partner Violence:   . Fear of Current or Ex-Partner: Not on file  . Emotionally Abused: Not on file  . Physically Abused: Not on file  . Sexually Abused: Not on file    Current Outpatient Medications on File Prior to Visit  Medication Sig Dispense Refill  . amLODipine (NORVASC) 10 MG tablet Take 1 tablet (10 mg total) by mouth daily. 90 tablet 3  . aspirin 81 MG tablet Take 81 mg by mouth daily.    [name] atorvastatin (LIPITOR) 80 MG tablet Take 1 tablet by mouth once daily 90 tablet 1  . BD ULTRA-FINE PEN NEEDLES 29G X 12.7MM MISC USE DAILY WITH VICTOZA 100 each 3  . gabapentin (NEURONTIN) 300 MG capsule TAKE 1 CAPSULE BY MOUTH THREE TIMES DAILY 90 capsule 3  . glucose blood (ACCU-CHEK AVIVA PLUS) test strip Use as instructed to monitor FSBS 4x daily due to fluctuating blood glucose levels. Dx: E11.65. 100 each 12  . HYDROcodone-acetaminophen (NORCO) 5-325 MG tablet Take 1 tablet by mouth every 6 (six) hours as needed for moderate pain. 30 tablet 0  . Insulin Glargine (LANTUS SOLOSTAR) 100 UNIT/ML Solostar Pen Inject 32 Units into the skin daily. INJECT 32 UNITS UNDER THE SKIN EVERY MORNIN 30 mL 3  . Insulin Pen Needle (SURE COMFORT PEN NEEDLES) 32G X 6 MM MISC Use daily with  Victoza 100 each 5  . JARDIANCE 25 MG TABS tablet Take 1 tablet by mouth once daily 90 tablet 2  . lisinopril (PRINIVIL,ZESTRIL) 20 MG tablet Take 1 tablet (20 mg total) by mouth daily. 90 tablet 3  . metFORMIN (GLUCOPHAGE) 1000 MG tablet Take 1 tablet (1,000 mg total) by mouth daily with breakfast. 90 tablet 3  . pantoprazole (PROTONIX) 40 MG tablet Take 1 tablet by mouth once daily 90 tablet 3  . ticagrelor (BRILINTA) 90 MG TABS tablet Take 1 tablet (90 mg total) by mouth 2 (two) times daily.  Please schedule annual appt with Dr. Oval Linsey for refills. [phone]. 1st attempt. 60 tablet 0  . VICTOZA 18 MG/3ML SOPN INJECT 0.2 MLS (1.2 MG TOTAL) INTO THE SKIN DAILY 18 mL 3  . nitroGLYCERIN (NITROSTAT) 0.4 MG SL tablet Place 1 tablet (0.4 mg total) under the tongue every 5 (five) minutes as needed for chest pain (up to 3 doses. If taking 3rd dose call 911). 25 tablet 3   No current facility-administered medications on file prior to visit.    Allergies  Allergen Reactions  . Beta Adrenergic Blockers     Metoprolol stopped 07/2018 due to 2nd degree type 2 AV block.    Family History  Problem Relation Age of Onset  . Hypertension Mother   . Heart attack Father   . Heart disease Father   . Alzheimer's disease Father   . Diabetes Father   . Heart attack Paternal Grandfather   . Heart disease Paternal Grandfather   . Lupus Sister     BP (!) 144/64 (BP Location: Left Arm, Patient Position: Sitting, Cuff Size: Large)   Pulse 77   Ht 5' 9.5" (1.765 m)   Wt 291 lb 3.2 oz (132.1 kg)   SpO2 95%   BMI 42.39 kg/m   Review of Systems He denies hypoglycemia.      Objective:   Physical Exam VITAL SIGNS:  See vs page GENERAL: no distress Pulses: dorsalis pedis intact bilat.   MSK: no deformity of the feet CV: trace bilat leg edema Skin:  no ulcer on the feet.  normal color and temp on the feet. Neuro: sensation is intact to touch on the feet.   Ext: there is bilateral onychomycosis of  the toenails.    Lab Results  Component Value Date   CREATININE 1.19 08/21/2019   BUN 15 08/21/2019   NA 138 08/21/2019   K 4.6 08/21/2019   CL 105 08/21/2019   CO2 21 08/21/2019     Lab Results  Component Value Date   HGBA1C 8.0 (A) 01/10/2020       Assessment & Plan:  Insulin-requiring type 2 DM, with CAD: worse.   Renal insuff: we should decrease glipizide.   Edema: This limits rx options  Patient Instructions  check your blood sugar twice a day.  vary the time of day when you check, between before the 3 meals, and at bedtime.  also check if you have symptoms of your blood sugar being too high or too low.  please keep a record of the readings and bring it to your next appointment here (or you can bring the meter itself).  You can write it on any piece of paper.  please call us sooner if your blood sugar goes below 70, or if you have a lot of readings over 200. We will need to take this complex situation in stages.   For now, please:  Reduce the glipizide to 1/2 pill with breakfast, and:  Increase the humalog to 14 units with breakfast and supper, and:  Please continue the same other diabetes medications for now.   Please come back for a follow-up appointment in 2 months.

## 2020-01-12 ENCOUNTER — Other Ambulatory Visit: Payer: Self-pay

## 2020-01-12 ENCOUNTER — Telehealth: Payer: Self-pay | Admitting: Endocrinology

## 2020-01-12 DIAGNOSIS — Z794 Long term (current) use of insulin: Secondary | ICD-10-CM

## 2020-01-12 MED ORDER — INSULIN LISPRO (1 UNIT DIAL) 100 UNIT/ML (KWIKPEN): 14.0000 [IU] | PEN_INJECTOR | Freq: Two times a day (BID) | SUBCUTANEOUS | 2 refills | Status: DC

## 2020-01-12 NOTE — Telephone Encounter (Signed)
Patients wife called stating she went to the pharmacy at [facility] yesterday and they did not have the RX for the humalog. I saw that it had "no print" at the bottom of the RX so I was not sure if that was accidental.

## 2020-01-12 NOTE — Telephone Encounter (Signed)
Outpatient Medication Detail   Disp Refills Start End   insulin lispro (HUMALOG KWIKPEN) 100 UNIT/ML KwikPen 8.4 mL 2 01/12/2020    Sig - Route: Inject 0.14 mLs (14 Units total) into the skin 2 (two) times daily with a meal. - Subcutaneous   Sent to pharmacy as: insulin lispro (HUMALOG KWIKPEN) 100 UNIT/ML KwikPen   E-Prescribing Status: Receipt confirmed by pharmacy (01/12/2020 10:39 AM EST)

## 2020-01-22 DIAGNOSIS — H40023 Open angle with borderline findings, high risk, bilateral: Secondary | ICD-10-CM | POA: Diagnosis not present

## 2020-01-22 DIAGNOSIS — H40052 Ocular hypertension, left eye: Secondary | ICD-10-CM | POA: Diagnosis not present

## 2020-01-29 ENCOUNTER — Encounter (INDEPENDENT_AMBULATORY_CARE_PROVIDER_SITE_OTHER): Payer: Medicare PPO | Admitting: Ophthalmology

## 2020-01-29 DIAGNOSIS — I10 Essential (primary) hypertension: Secondary | ICD-10-CM

## 2020-01-29 DIAGNOSIS — H35033 Hypertensive retinopathy, bilateral: Secondary | ICD-10-CM

## 2020-01-29 DIAGNOSIS — H43813 Vitreous degeneration, bilateral: Secondary | ICD-10-CM

## 2020-01-29 DIAGNOSIS — E113211 Type 2 diabetes mellitus with mild nonproliferative diabetic retinopathy with macular edema, right eye: Secondary | ICD-10-CM | POA: Diagnosis not present

## 2020-01-29 DIAGNOSIS — E113312 Type 2 diabetes mellitus with moderate nonproliferative diabetic retinopathy with macular edema, left eye: Secondary | ICD-10-CM

## 2020-01-29 DIAGNOSIS — E11311 Type 2 diabetes mellitus with unspecified diabetic retinopathy with macular edema: Secondary | ICD-10-CM

## 2020-02-20 ENCOUNTER — Other Ambulatory Visit: Payer: Self-pay | Admitting: *Deleted

## 2020-02-20 MED ORDER — GABAPENTIN 300 MG PO CAPS: 300.0000 mg | ORAL_CAPSULE | Freq: Three times a day (TID) | ORAL | 3 refills | Status: DC

## 2020-02-28 ENCOUNTER — Other Ambulatory Visit: Payer: Self-pay | Admitting: Cardiovascular Disease

## 2020-03-01 ENCOUNTER — Telehealth: Payer: Self-pay | Admitting: Cardiovascular Disease

## 2020-03-01 NOTE — Telephone Encounter (Signed)
SUSAN Allcorn-Wife (DPR)  Discussed refill procedure and OV's verbalizes understanding. She states that he will not be able to take/afford.    Samples available and put at the front desk for pt  Wife notified

## 2020-03-01 NOTE — Telephone Encounter (Signed)
Pt c/o medication issue:  1. Name of Medication: ticagrelor (BRILINTA) 90 MG TABS tablet  2. How are you currently taking this medication (dosage and times per day)? Not currently taking medication  3. Are you having a reaction (difficulty breathing--STAT)? No  4. What is your medication issue? Erik Hancock is calling stating Erik Hancock is out of Brilinta and it would cost her $40 dollars to go pick up the 30 day supply that was called in to last him until his appointment. Erik Hancock is wanting to know if Erik Hancock can stop taking the medication until his appointment on 03/07/20 so they can just pick up the 90 day supply after his appointment and not spend $40 extra for the 30 day supply. Please advise.

## 2020-03-04 DIAGNOSIS — H40052 Ocular hypertension, left eye: Secondary | ICD-10-CM | POA: Diagnosis not present

## 2020-03-04 DIAGNOSIS — H40023 Open angle with borderline findings, high risk, bilateral: Secondary | ICD-10-CM | POA: Diagnosis not present

## 2020-03-07 ENCOUNTER — Other Ambulatory Visit: Payer: Self-pay

## 2020-03-07 ENCOUNTER — Ambulatory Visit: Payer: Medicare PPO | Admitting: Cardiology

## 2020-03-07 ENCOUNTER — Encounter: Payer: Self-pay | Admitting: Cardiology

## 2020-03-07 VITALS — BP 140/72 | HR 67 | Ht 69.5 in | Wt 286.4 lb

## 2020-03-07 DIAGNOSIS — Z794 Long term (current) use of insulin: Secondary | ICD-10-CM

## 2020-03-07 DIAGNOSIS — I209 Angina pectoris, unspecified: Secondary | ICD-10-CM | POA: Diagnosis not present

## 2020-03-07 DIAGNOSIS — E785 Hyperlipidemia, unspecified: Secondary | ICD-10-CM

## 2020-03-07 DIAGNOSIS — Z9861 Coronary angioplasty status: Secondary | ICD-10-CM

## 2020-03-07 DIAGNOSIS — I2510 Atherosclerotic heart disease of native coronary artery without angina pectoris: Secondary | ICD-10-CM

## 2020-03-07 DIAGNOSIS — I10 Essential (primary) hypertension: Secondary | ICD-10-CM

## 2020-03-07 DIAGNOSIS — E119 Type 2 diabetes mellitus without complications: Secondary | ICD-10-CM

## 2020-03-07 DIAGNOSIS — N183 Chronic kidney disease, stage 3 unspecified: Secondary | ICD-10-CM

## 2020-03-07 DIAGNOSIS — I440 Atrioventricular block, first degree: Secondary | ICD-10-CM | POA: Diagnosis not present

## 2020-03-07 MED ORDER — TICAGRELOR 60 MG PO TABS: 60.0000 mg | ORAL_TABLET | Freq: Two times a day (BID) | ORAL | 3 refills | Status: DC

## 2020-03-07 MED ORDER — NITROGLYCERIN 0.4 MG/SPRAY TL SOLN: 1.0000 | 12 refills | Status: DC | PRN

## 2020-03-07 NOTE — Progress Notes (Signed)
Cardiology Office Note:    Date:  03/07/2020   ID:  Erik Hancock, DOB [birth year], MRN [id number]  PCP:  Donita Brooks, MD  Cardiologist:  Chilton Si, MD  Electrophysiologist:  None   Referring MD: Donita Brooks, MD   No chief complaint on file.   History of Present Illness:    Erik Hancock is a 69 y.o. male with a hx of CAD, s/p prior PCI in '03, 07, and sept 2019 to a new pLAD lesion.  Thee prio PDA, CFX, and LAD stents were patent.  The patient does not typically have classic chest pain. He is in the office today for follow up.  He has had some atypical localized lt chest pain that lasts a minute.  He has no exertional symptoms.   Past Medical History:  Diagnosis Date  . Arthritis    "all over" (07/29/2018)  . CAD S/P percutaneous coronary angioplasty    a. stent to mLAD 11/2001. b. DES to PDA/mLCx 2007. c. DES to ostial LAD 07/2018.  [name] Chronic lower back pain   . CKD (chronic kidney disease), stage III   . CKD (chronic kidney disease), stage III   . Diabetic retinopathy (HCC)   . GERD (gastroesophageal reflux disease)   . High cholesterol   . History of kidney stones   . Hypertension   . Mobitz type 2 second degree atrioventricular block    a. noted during 07/2018 adm, metoprolol discontinued.  . Morbid obesity (HCC) 07/28/2018  . Prostatitis   . Pulmonary nodule 2013   CT  . Sleep apnea    stopbang=5  . Suspected sleep apnea   . Type II diabetes mellitus (HCC)     Past Surgical History:  Procedure Laterality Date  . BACK SURGERY    . CARDIAC CATHETERIZATION  JUNE 2003   PATENT LAD STENT/ BODERLINE OBSTRUCTIVE DISEASE POSTERIOR DESCENDING ARTERY/ NORMAL LVF  . CATARACT EXTRACTION W/ INTRAOCULAR LENS  IMPLANT, BILATERAL Bilateral 2017  . CERVICAL SCOVILLE FORAMINOTOMY W/ EXCISION OF HERNIATED NUCLEC PULPOSUS  2009   C6 - 7  . CORONARY ANGIOPLASTY WITH STENT PLACEMENT  03/30/2006   DR EDMUNDS - 90% LCx@OM2  TAXUS  DES 3.0 X 20 -> 3.25 mm,   95% RPDA --  TAXUS DES 3.0 X 16 --> 3.5 mm  . CORONARY ANGIOPLASTY WITH STENT PLACEMENT  11/2001   (Dr. Ty Hilts for Dr. Mayford Knife) - mLAD@D2  - TAXUS EXPRESS DES 3.5 x 15   . CORONARY ANGIOPLASTY WITH STENT PLACEMENT  07/29/2018  . CORONARY STENT INTERVENTION N/A 07/29/2018   Procedure: CORONARY STENT INTERVENTION;  Surgeon: Marykay Lex, MD;  Location: [facility] INVASIVE CV LAB;  Service: Cardiovascular;  Laterality: N/A;  . CYSTOSCOPY W/ RETROGRADES  05/04/2012   Procedure: CYSTOSCOPY WITH RETROGRADE PYELOGRAM;  Surgeon: Milford Cage, MD;  Location: [facility];  Service: Urology;  Laterality: Left;  . CYSTOSCOPY W/ URETERAL STENT PLACEMENT  05/04/2012   Procedure: CYSTOSCOPY WITH STENT REPLACEMENT;  Surgeon: Milford Cage, MD;  Location: [facility];  Service: Urology;  Laterality: Left;  . CYSTOSCOPY WITH STENT PLACEMENT Left 04/04/2012  . CYSTOSCOPY WITH URETEROSCOPY  05/04/2012   Procedure: CYSTOSCOPY WITH URETEROSCOPY;  Surgeon: Milford Cage, MD;  Location: [facility];  Service: Urology;  Laterality: Left;      . LEFT HEART CATH AND CORONARY ANGIOGRAPHY N/A 07/29/2018   Procedure: LEFT HEART CATH AND CORONARY ANGIOGRAPHY;  Surgeon: Marykay Lex, MD;  Location: [facility] INVASIVE CV LAB;  Service:  Cardiovascular;  Laterality: N/A;  . LEFT URETEROSCOPIC STONE EXTRACTION  03-14-2003   X2    Current Medications: Current Meds  Medication Sig  . amLODipine (NORVASC) 10 MG tablet Take 1 tablet (10 mg total) by mouth daily.  [name] aspirin 81 MG tablet Take 81 mg by mouth daily.  [name] atorvastatin (LIPITOR) 80 MG tablet Take 1 tablet by mouth once daily  . BD ULTRA-FINE PEN NEEDLES 29G X 12.7MM MISC USE DAILY WITH VICTOZA  . gabapentin (NEURONTIN) 300 MG capsule Take 1 capsule (300 mg total) by mouth 3 (three) times daily.  [name] glipiZIDE (GLUCOTROL) 5 MG tablet Take 1 tablet (5 mg total) by mouth daily before breakfast.  . glucose blood (ACCU-CHEK AVIVA PLUS) test strip Use as instructed to monitor FSBS 4x daily  due to fluctuating blood glucose levels. Dx: E11.65.  . HYDROcodone-acetaminophen (NORCO) 5-325 MG tablet Take 1 tablet by mouth every 6 (six) hours as needed for moderate pain.  . Insulin Glargine (LANTUS SOLOSTAR) 100 UNIT/ML Solostar Pen Inject 32 Units into the skin daily. INJECT 32 UNITS UNDER THE SKIN EVERY MORNIN  . insulin lispro (HUMALOG KWIKPEN) 100 UNIT/ML KwikPen Inject 0.14 mLs (14 Units total) into the skin 2 (two) times daily with a meal.  . Insulin Pen Needle (SURE COMFORT PEN NEEDLES) 32G X 6 MM MISC Use daily with Victoza  . JARDIANCE 25 MG TABS tablet Take 1 tablet by mouth once daily  . lisinopril (PRINIVIL,ZESTRIL) 20 MG tablet Take 1 tablet (20 mg total) by mouth daily.  . metFORMIN (GLUCOPHAGE) 1000 MG tablet Take 1 tablet (1,000 mg total) by mouth daily with breakfast.  . pantoprazole (PROTONIX) 40 MG tablet Take 1 tablet by mouth once daily  . ticagrelor (BRILINTA) 90 MG TABS tablet Take 1 tablet (90 mg total) by mouth 2 (two) times daily. Please keep upcoming appt for refills. Thank you  . VICTOZA 18 MG/3ML SOPN INJECT 0.2 MLS (1.2 MG TOTAL) INTO THE SKIN DAILY     Allergies:   Beta adrenergic blockers   Social History   Socioeconomic History  . Marital status: Married    Spouse name: Not on file  . Number of children: Not on file  . Years of education: Not on file  . Highest education level: Not on file  Occupational History  . Not on file  Tobacco Use  . Smoking status: Never Smoker  . Smokeless tobacco: Never Used  Substance and Sexual Activity  . Alcohol use: Never  . Drug use: Never  . Sexual activity: Not Currently  Other Topics Concern  . Not on file  Social History Narrative  . Not on file   Social Determinants of Health   Financial Resource Strain:   . Difficulty of Paying Living Expenses:   Food Insecurity:   . Worried About Charity fundraiser in the Last Year:   . Arboriculturist in the Last Year:   Transportation Needs:   . Lexicographer (Medical):   [name] Lack of Transportation (Non-Medical):   Physical Activity:   . Days of Exercise per Week:   . Minutes of Exercise per Session:   Stress:   . Feeling of Stress :   Social Connections:   . Frequency of Communication with Friends and Family:   . Frequency of Social Gatherings with Friends and Family:   . Attends Religious Services:   . Active Member of Clubs or Organizations:   . Attends Archivist Meetings:   .  Marital Status:      Family History: The patient's family history includes Alzheimer's disease in his father; Diabetes in his father; Heart attack in his father and paternal grandfather; Heart disease in his father and paternal grandfather; Hypertension in his mother; Lupus in his sister.  ROS:   Please see the history of present illness.     All other systems reviewed and are negative.  EKGs/Labs/Other Studies Reviewed:    The following studies were reviewed today: PCI 2018-08-19  EKG:  EKG is ordered today.  The ekg ordered today demonstrates NSR- HR 66, poor anterior RW, 1st AVB  Recent Labs: 08/21/2019: ALT 26; BUN 15; Creat 1.19; Hemoglobin 13.3; Platelets 316; Potassium 4.6; Sodium 138  Recent Lipid Panel    Component Value Date/Time   CHOL 89 08/21/2019 0849   TRIG 72 08/21/2019 0849   HDL 52 08/21/2019 0849   CHOLHDL 1.7 08/21/2019 0849   VLDL 29 05/10/2017 0832   LDLCALC 22 08/21/2019 0849    Physical Exam:    VS:  BP 140/72   Pulse 67   Ht 5' 9.5" (1.765 m)   Wt 286 lb 6.4 oz (129.9 kg)   SpO2 97%   BMI 41.69 kg/m     Wt Readings from Last 3 Encounters:  03/07/20 286 lb 6.4 oz (129.9 kg)  01/10/20 291 lb 3.2 oz (132.1 kg)  10/18/19 277 lb 12.8 oz (126 kg)     GEN:  Overweight male,well developed in no acute distress HEENT: Normal NECK: No JVD; No carotid bruits CARDIAC: RRR, no murmurs, rubs, gallops RESPIRATORY:  Clear to auscultation without rales, wheezing or rhonchi  ABDOMEN: Soft, non-tender,  non-distended MUSCULOSKELETAL:  No edema; No deformity  SKIN: Warm and dry NEUROLOGIC:  Alert and oriented x 3 PSYCHIATRIC:  Normal affect   ASSESSMENT:    CAD S/P percutaneous coronary angioplasty H/O multiple PCIs- last was new pLAD DES Aug 19, 2018  Essential hypertension Controlled  Dyslipidemia, goal LDL below 70 On high dose statin Rx- LDL 22  Insulin dependent type 2 diabetes mellitus (HCC) Dr Everardo All following- Hgb A1c=8  Morbid obesity (HCC) BMI 41  First degree heart block PR  PLAN:    Decrease Brilinta to 60 mg BID. Refill NTG SL PRN (we discussed typical symptoms of angina).  F/U one year. Conmsider re do sleep study- he'll let us know.     Medication Adjustments/Labs and Tests Ordered: Current medicines are reviewed at length with the patient today.  Concerns regarding medicines are outlined above.  No orders of the defined types were placed in this encounter.  No orders of the defined types were placed in this encounter.   Patient Instructions  Medication Instructions:  STOP BRILINTA 90 MG   START BRILINTA 60 MG TWICE A DAY   *If you need a refill on your cardiac medications before your next appointment, please call your pharmacy*  Lab Work: NONE  If you have labs (blood work) drawn today and your tests are completely normal, you will receive your results only by: [name] MyChart Message (if you have MyChart) OR . A paper copy in the mail If you have any lab test that is abnormal or we need to change your treatment, we will call you to review the results.  Testing/Procedures: NONE  Follow-Up: At [facility], you and your health needs are our priority.  As part of our continuing mission to provide you with exceptional heart care, we have created designated Provider Care Teams.  These Care Teams include your primary Cardiologist (physician) and Advanced Practice Providers (APPs -  Physician Assistants and Nurse Practitioners) who all work together  to provide you with the care you need, when you need it.  We recommend signing up for the patient portal called "MyChart".  Sign up information is provided on this After Visit Summary.  MyChart is used to connect with patients for Virtual Visits (Telemedicine).  Patients are able to view lab/test results, encounter notes, upcoming appointments, etc.  Non-urgent messages can be sent to your provider as well.   To learn more about what you can do with MyChart, go to [email or web address].    Your next appointment:   12 month(s)  The format for your next appointment:   In Person  Provider:   You may see Chilton Si, MD or one of the following Advanced Practice Providers on your designated Care Team:    Corine Shelter, PA-C  [place], [place]  Edd Fabian, FNP     Signed, Corine Shelter, [place]  03/07/2020 11:06 AM    [facility]

## 2020-03-07 NOTE — Assessment & Plan Note (Signed)
On high dose statin Rx- LDL 22

## 2020-03-07 NOTE — Assessment & Plan Note (Signed)
PR 

## 2020-03-07 NOTE — Assessment & Plan Note (Signed)
BMI 41 

## 2020-03-07 NOTE — Assessment & Plan Note (Signed)
H/O multiple PCIs- last was new pLAD DES Sept 2019

## 2020-03-07 NOTE — Assessment & Plan Note (Signed)
Dr Everardo All following- Hgb A1c=8

## 2020-03-07 NOTE — Assessment & Plan Note (Signed)
Controlled.  

## 2020-03-07 NOTE — Patient Instructions (Addendum)
Medication Instructions:  STOP BRILINTA 90 MG   START BRILINTA 60 MG TWICE A DAY   NTG SPRAY HAS BEEN SENT TO PHARMACY   *If you need a refill on your cardiac medications before your next appointment, please call your pharmacy*  Lab Work: NONE  If you have labs (blood work) drawn today and your tests are completely normal, you will receive your results only by: [name] MyChart Message (if you have MyChart) OR . A paper copy in the mail If you have any lab test that is abnormal or we need to change your treatment, we will call you to review the results.  Testing/Procedures: NONE  Follow-Up: At [facility], you and your health needs are our priority.  As part of our continuing mission to provide you with exceptional heart care, we have created designated Provider Care Teams.  These Care Teams include your primary Cardiologist (physician) and Advanced Practice Providers (APPs -  Physician Assistants and Nurse Practitioners) who all work together to provide you with the care you need, when you need it.  We recommend signing up for the patient portal called "MyChart".  Sign up information is provided on this After Visit Summary.  MyChart is used to connect with patients for Virtual Visits (Telemedicine).  Patients are able to view lab/test results, encounter notes, upcoming appointments, etc.  Non-urgent messages can be sent to your provider as well.   To learn more about what you can do with MyChart, go to [email or web address].    Your next appointment:   12 month(s)  You will receive a reminder letter in the mail two months in advance. If you don't receive a letter, please call our office to schedule the follow-up appointment.  The format for your next appointment:   In Person  Provider:   You may see Chilton Si, MD or one of the following Advanced Practice Providers on your designated Care Team:    Corine Shelter, PA-C  [place], [place]  Edd Fabian, [place]

## 2020-03-11 ENCOUNTER — Ambulatory Visit: Payer: Medicare PPO | Admitting: Endocrinology

## 2020-03-11 ENCOUNTER — Encounter: Payer: Self-pay | Admitting: Endocrinology

## 2020-03-11 ENCOUNTER — Other Ambulatory Visit: Payer: Self-pay

## 2020-03-11 ENCOUNTER — Other Ambulatory Visit: Payer: Self-pay | Admitting: Cardiovascular Disease

## 2020-03-11 VITALS — BP 140/60 | HR 80 | Ht 69.5 in | Wt 287.0 lb

## 2020-03-11 DIAGNOSIS — N1831 Chronic kidney disease, stage 3a: Secondary | ICD-10-CM | POA: Diagnosis not present

## 2020-03-11 DIAGNOSIS — E119 Type 2 diabetes mellitus without complications: Secondary | ICD-10-CM

## 2020-03-11 DIAGNOSIS — Z794 Long term (current) use of insulin: Secondary | ICD-10-CM | POA: Diagnosis not present

## 2020-03-11 DIAGNOSIS — E1121 Type 2 diabetes mellitus with diabetic nephropathy: Secondary | ICD-10-CM | POA: Diagnosis not present

## 2020-03-11 LAB — POCT GLYCOSYLATED HEMOGLOBIN (HGB A1C): Hemoglobin A1C: 7.7 % — AB (ref 4.0–5.6)

## 2020-03-11 MED ORDER — INSULIN LISPRO (1 UNIT DIAL) 100 UNIT/ML (KWIKPEN): 18.0000 [IU] | PEN_INJECTOR | Freq: Two times a day (BID) | SUBCUTANEOUS | 2 refills | Status: DC

## 2020-03-11 NOTE — Patient Instructions (Addendum)
check your blood sugar twice a day.  vary the time of day when you check, between before the 3 meals, and at bedtime.  also check if you have symptoms of your blood sugar being too high or too low.  please keep a record of the readings and bring it to your next appointment here (or you can bring the meter itself).  You can write it on any piece of paper.  please call us sooner if your blood sugar goes below 70, or if you have a lot of readings over 200. We will need to take this complex situation in stages.   For now, please:  Stop taking the glipizide, and:  Increase the humalog to 18 units with breakfast and supper, and:  Please continue the same other diabetes medications for now.   Please come back for a follow-up appointment in 2 months.

## 2020-03-11 NOTE — Progress Notes (Signed)
Subjective:    Patient ID: Erik Hancock, male    DOB: May 21, 1951, 69 y.o.   MRN: [id number]  HPI Pt returns for f/u of diabetes mellitus: DM type: Insulin-requiring type 2 Dx'ed: 2297 Complications: PN, DR, CAD, and renal insuff Therapy: insulin since 2018, Victoza, and 3 oral meds.  DKA: never Severe hypoglycemia: never Pancreatitis: never Pancreatic imaging: normal on 2017 CT Other: he takes multiple daily injections Interval history: He brings a record of his cbg's which I have reviewed today.  cbg varies from 65-188.  It is in general higher as the day goes on, but not necessarily so.  pt states he feels well in general.  Past Medical History:  Diagnosis Date  . Arthritis    "all over" (07/29/2018)  . CAD S/P percutaneous coronary angioplasty    a. stent to mLAD 11/2001. b. DES to PDA/mLCx 2007. c. DES to ostial LAD 07/2018.  [name] Chronic lower back pain   . CKD (chronic kidney disease), stage III   . CKD (chronic kidney disease), stage III   . Diabetic retinopathy ([place])   . GERD (gastroesophageal reflux disease)   . High cholesterol   . History of kidney stones   . Hypertension   . Mobitz type 2 second degree atrioventricular block    a. noted during 07/2018 adm, metoprolol discontinued.  . Morbid obesity ([place]) 07/28/2018  . Prostatitis   . Pulmonary nodule 2013   CT  . Sleep apnea    stopbang=5  . Suspected sleep apnea   . Type II diabetes mellitus ([place])     Past Surgical History:  Procedure Laterality Date  . BACK SURGERY    . CARDIAC CATHETERIZATION  JUNE 2003   PATENT LAD STENT/ BODERLINE OBSTRUCTIVE DISEASE POSTERIOR DESCENDING ARTERY/ NORMAL LVF  . CATARACT EXTRACTION W/ INTRAOCULAR LENS  IMPLANT, BILATERAL Bilateral 2017  . CERVICAL SCOVILLE FORAMINOTOMY W/ EXCISION OF HERNIATED NUCLEC PULPOSUS  2009   C6 - 7  . CORONARY ANGIOPLASTY WITH STENT PLACEMENT  03/30/2006   DR EDMUNDS - 90% LCx@OM2  TAXUS  DES 3.0 X 20 -> 3.25 mm,   95% RPDA -- TAXUS DES 3.0 X 16 -->  3.5 mm  . CORONARY ANGIOPLASTY WITH STENT PLACEMENT  11/2001   (Dr. Ilda Foil for Dr. Radford Pax) - mLAD@D2  - TAXUS EXPRESS DES 3.5 x 15   . CORONARY ANGIOPLASTY WITH STENT PLACEMENT  07/29/2018  . CORONARY STENT INTERVENTION N/A 07/29/2018   Procedure: CORONARY STENT INTERVENTION;  Surgeon: Leonie Man, MD;  Location: [place] [facility];  Service: Cardiovascular;  Laterality: N/A;  . CYSTOSCOPY W/ RETROGRADES  05/04/2012   Procedure: CYSTOSCOPY WITH RETROGRADE PYELOGRAM;  Surgeon: Molli Hazard, MD;  Location: [facility];  Service: Urology;  Laterality: Left;  . CYSTOSCOPY W/ URETERAL STENT PLACEMENT  05/04/2012   Procedure: CYSTOSCOPY WITH STENT REPLACEMENT;  Surgeon: Molli Hazard, MD;  Location: [facility];  Service: Urology;  Laterality: Left;  . CYSTOSCOPY WITH STENT PLACEMENT Left 04/04/2012  . CYSTOSCOPY WITH URETEROSCOPY  05/04/2012   Procedure: CYSTOSCOPY WITH URETEROSCOPY;  Surgeon: Molli Hazard, MD;  Location: [facility];  Service: Urology;  Laterality: Left;      . LEFT HEART CATH AND CORONARY ANGIOGRAPHY N/A 07/29/2018   Procedure: LEFT HEART CATH AND CORONARY ANGIOGRAPHY;  Surgeon: Leonie Man, MD;  Location: [place] [facility];  Service: Cardiovascular;  Laterality: N/A;  . LEFT URETEROSCOPIC STONE EXTRACTION  03-14-2003   X2    Social History  Socioeconomic History  . Marital status: Married    Spouse name: Not on file  . Number of children: Not on file  . Years of education: Not on file  . Highest education level: Not on file  Occupational History  . Not on file  Tobacco Use  . Smoking status: Never Smoker  . Smokeless tobacco: Never Used  Substance and Sexual Activity  . Alcohol use: Never  . Drug use: Never  . Sexual activity: Not Currently  Other Topics Concern  . Not on file  Social History Narrative  . Not on file   Social Determinants of Health   Financial Resource Strain:   . Difficulty of Paying Living Expenses:   Food Insecurity:    . Worried About Programme researcher, broadcasting/film/video in the Last Year:   . Barista in the Last Year:   Transportation Needs:   . Freight forwarder (Medical):   [name] Lack of Transportation (Non-Medical):   Physical Activity:   . Days of Exercise per Week:   . Minutes of Exercise per Session:   Stress:   . Feeling of Stress :   Social Connections:   . Frequency of Communication with Friends and Family:   . Frequency of Social Gatherings with Friends and Family:   . Attends Religious Services:   . Active Member of Clubs or Organizations:   . Attends Banker Meetings:   [name] Marital Status:   Intimate Partner Violence:   . Fear of Current or Ex-Partner:   . Emotionally Abused:   [name] Physically Abused:   . Sexually Abused:     Current Outpatient Medications on File Prior to Visit  Medication Sig Dispense Refill  . amLODipine (NORVASC) 10 MG tablet Take 1 tablet (10 mg total) by mouth daily. 90 tablet 3  . aspirin 81 MG tablet Take 81 mg by mouth daily.    [name] atorvastatin (LIPITOR) 80 MG tablet Take 1 tablet by mouth once daily 90 tablet 1  . BD ULTRA-FINE PEN NEEDLES 29G X 12.7MM MISC USE DAILY WITH VICTOZA 100 each 3  . gabapentin (NEURONTIN) 300 MG capsule Take 1 capsule (300 mg total) by mouth 3 (three) times daily. 90 capsule 3  . glucose blood (ACCU-CHEK AVIVA PLUS) test strip Use as instructed to monitor FSBS 4x daily due to fluctuating blood glucose levels. Dx: E11.65. 100 each 12  . HYDROcodone-acetaminophen (NORCO) 5-325 MG tablet Take 1 tablet by mouth every 6 (six) hours as needed for moderate pain. 30 tablet 0  . Insulin Glargine (LANTUS SOLOSTAR) 100 UNIT/ML Solostar Pen Inject 32 Units into the skin daily. INJECT 32 UNITS UNDER THE SKIN EVERY MORNIN 30 mL 3  . Insulin Pen Needle (SURE COMFORT PEN NEEDLES) 32G X 6 MM MISC Use daily with Victoza 100 each 5  . JARDIANCE 25 MG TABS tablet Take 1 tablet by mouth once daily 90 tablet 2  . metFORMIN (GLUCOPHAGE) 1000 MG tablet  Take 1 tablet (1,000 mg total) by mouth daily with breakfast. 90 tablet 3  . nitroGLYCERIN (NITROLINGUAL) 0.4 MG/SPRAY spray Place 1 spray under the tongue every 5 (five) minutes x 3 doses as needed for chest pain. 12 g 12  . pantoprazole (PROTONIX) 40 MG tablet Take 1 tablet by mouth once daily 90 tablet 3  . ticagrelor (BRILINTA) 60 MG TABS tablet Take 1 tablet (60 mg total) by mouth 2 (two) times daily. Please keep upcoming appt for refills. Thank you 180 tablet 3  .  VICTOZA 18 MG/3ML SOPN INJECT 0.2 MLS (1.2 MG TOTAL) INTO THE SKIN DAILY 18 mL 3   No current facility-administered medications on file prior to visit.    Allergies  Allergen Reactions  . Beta Adrenergic Blockers     Metoprolol stopped 07/2018 due to 2nd degree type 2 AV block.    Family History  Problem Relation Age of Onset  . Hypertension Mother   . Heart attack Father   . Heart disease Father   . Alzheimer's disease Father   . Diabetes Father   . Heart attack Paternal Grandfather   . Heart disease Paternal Grandfather   . Lupus Sister     BP 140/60   Pulse 80   Ht 5' 9.5" (1.765 m)   Wt 287 lb (130.2 kg)   SpO2 98%   BMI 41.77 kg/m    Review of Systems Denies LOC.     Objective:   Physical Exam VITAL SIGNS:  See vs page GENERAL: no distress Pulses: dorsalis pedis intact bilat.   MSK: no deformity of the feet CV: trace bilat leg edema Skin:  no ulcer on the feet.  normal color and temp on the feet.  Neuro: sensation is intact to touch on the feet.   Ext: there is bilateral onychomycosis of the toenails.    Lab Results  Component Value Date   HGBA1C 7.7 (A) 03/11/2020       Assessment & Plan:  Insulin-requiring type 2 DM. Renal insuff: he should phase out glipizide. Hypoglycemia: this limits aggressiveness of glycemic control.    Patient Instructions  check your blood sugar twice a day.  vary the time of day when you check, between before the 3 meals, and at bedtime.  also check if you  have symptoms of your blood sugar being too high or too low.  please keep a record of the readings and bring it to your next appointment here (or you can bring the meter itself).  You can write it on any piece of paper.  please call us sooner if your blood sugar goes below 70, or if you have a lot of readings over 200. We will need to take this complex situation in stages.   For now, please:  Stop taking the glipizide, and:  Increase the humalog to 18 units with breakfast and supper, and:  Please continue the same other diabetes medications for now.   Please come back for a follow-up appointment in 2 months.

## 2020-03-18 ENCOUNTER — Encounter (INDEPENDENT_AMBULATORY_CARE_PROVIDER_SITE_OTHER): Payer: Medicare PPO | Admitting: Ophthalmology

## 2020-03-18 ENCOUNTER — Other Ambulatory Visit: Payer: Self-pay

## 2020-03-18 DIAGNOSIS — E113312 Type 2 diabetes mellitus with moderate nonproliferative diabetic retinopathy with macular edema, left eye: Secondary | ICD-10-CM

## 2020-03-18 DIAGNOSIS — E11311 Type 2 diabetes mellitus with unspecified diabetic retinopathy with macular edema: Secondary | ICD-10-CM

## 2020-03-18 DIAGNOSIS — H35033 Hypertensive retinopathy, bilateral: Secondary | ICD-10-CM | POA: Diagnosis not present

## 2020-03-18 DIAGNOSIS — I10 Essential (primary) hypertension: Secondary | ICD-10-CM

## 2020-03-18 DIAGNOSIS — H43813 Vitreous degeneration, bilateral: Secondary | ICD-10-CM

## 2020-03-18 DIAGNOSIS — E113391 Type 2 diabetes mellitus with moderate nonproliferative diabetic retinopathy without macular edema, right eye: Secondary | ICD-10-CM | POA: Diagnosis not present

## 2020-03-26 DIAGNOSIS — H40022 Open angle with borderline findings, high risk, left eye: Secondary | ICD-10-CM | POA: Diagnosis not present

## 2020-03-26 DIAGNOSIS — H40052 Ocular hypertension, left eye: Secondary | ICD-10-CM | POA: Diagnosis not present

## 2020-05-07 DIAGNOSIS — H40052 Ocular hypertension, left eye: Secondary | ICD-10-CM | POA: Diagnosis not present

## 2020-05-07 DIAGNOSIS — H40023 Open angle with borderline findings, high risk, bilateral: Secondary | ICD-10-CM | POA: Diagnosis not present

## 2020-05-08 ENCOUNTER — Other Ambulatory Visit: Payer: Self-pay

## 2020-05-08 MED ORDER — ATORVASTATIN CALCIUM 80 MG PO TABS: 80.0000 mg | ORAL_TABLET | Freq: Every day | ORAL | 1 refills | Status: DC

## 2020-05-13 ENCOUNTER — Other Ambulatory Visit: Payer: Self-pay

## 2020-05-13 ENCOUNTER — Encounter (INDEPENDENT_AMBULATORY_CARE_PROVIDER_SITE_OTHER): Payer: Medicare PPO | Admitting: Ophthalmology

## 2020-05-13 DIAGNOSIS — H43813 Vitreous degeneration, bilateral: Secondary | ICD-10-CM

## 2020-05-13 DIAGNOSIS — I10 Essential (primary) hypertension: Secondary | ICD-10-CM | POA: Diagnosis not present

## 2020-05-13 DIAGNOSIS — E113313 Type 2 diabetes mellitus with moderate nonproliferative diabetic retinopathy with macular edema, bilateral: Secondary | ICD-10-CM | POA: Diagnosis not present

## 2020-05-13 DIAGNOSIS — H35033 Hypertensive retinopathy, bilateral: Secondary | ICD-10-CM

## 2020-05-13 DIAGNOSIS — E11311 Type 2 diabetes mellitus with unspecified diabetic retinopathy with macular edema: Secondary | ICD-10-CM

## 2020-05-16 ENCOUNTER — Ambulatory Visit: Payer: Medicare PPO | Admitting: Endocrinology

## 2020-06-03 ENCOUNTER — Other Ambulatory Visit: Payer: Self-pay | Admitting: Cardiovascular Disease

## 2020-06-03 ENCOUNTER — Other Ambulatory Visit: Payer: Self-pay | Admitting: Family Medicine

## 2020-06-12 ENCOUNTER — Other Ambulatory Visit: Payer: Self-pay | Admitting: Family Medicine

## 2020-06-14 ENCOUNTER — Other Ambulatory Visit: Payer: Self-pay | Admitting: Family Medicine

## 2020-06-17 ENCOUNTER — Ambulatory Visit: Payer: Medicare PPO | Admitting: Endocrinology

## 2020-06-17 ENCOUNTER — Encounter: Payer: Self-pay | Admitting: Endocrinology

## 2020-06-17 ENCOUNTER — Other Ambulatory Visit: Payer: Self-pay

## 2020-06-17 VITALS — BP 142/66 | HR 80 | Ht 69.5 in | Wt 291.4 lb

## 2020-06-17 DIAGNOSIS — E1121 Type 2 diabetes mellitus with diabetic nephropathy: Secondary | ICD-10-CM | POA: Diagnosis not present

## 2020-06-17 DIAGNOSIS — E119 Type 2 diabetes mellitus without complications: Secondary | ICD-10-CM

## 2020-06-17 DIAGNOSIS — N1831 Chronic kidney disease, stage 3a: Secondary | ICD-10-CM

## 2020-06-17 DIAGNOSIS — Z794 Long term (current) use of insulin: Secondary | ICD-10-CM | POA: Diagnosis not present

## 2020-06-17 LAB — POCT GLYCOSYLATED HEMOGLOBIN (HGB A1C): Hemoglobin A1C: 7.6 % — AB (ref 4.0–5.6)

## 2020-06-17 MED ORDER — LANTUS SOLOSTAR 100 UNIT/ML ~~LOC~~ SOPN: 28.0000 [IU] | PEN_INJECTOR | Freq: Every day | SUBCUTANEOUS | 3 refills | Status: DC

## 2020-06-17 MED ORDER — ACCU-CHEK AVIVA PLUS VI STRP: 1.0000 | ORAL_STRIP | Freq: Two times a day (BID) | 3 refills | Status: DC

## 2020-06-17 MED ORDER — INSULIN LISPRO (1 UNIT DIAL) 100 UNIT/ML (KWIKPEN): 20.0000 [IU] | PEN_INJECTOR | Freq: Two times a day (BID) | SUBCUTANEOUS | 11 refills | Status: DC

## 2020-06-17 MED ORDER — BD PEN NEEDLE ORIGINAL U/F 29G X 12.7MM MISC: 1.0000 | Freq: Four times a day (QID) | 3 refills | Status: DC

## 2020-06-17 MED ORDER — VICTOZA 18 MG/3ML ~~LOC~~ SOPN: 1.8000 mg | PEN_INJECTOR | Freq: Every day | SUBCUTANEOUS | 3 refills | Status: DC

## 2020-06-17 NOTE — Patient Instructions (Addendum)
check your blood sugar twice a day.  vary the time of day when you check, between before the 3 meals, and at bedtime.  also check if you have symptoms of your blood sugar being too high or too low.  please keep a record of the readings and bring it to your next appointment here (or you can bring the meter itself).  You can write it on any piece of paper.  please call us sooner if your blood sugar goes below 70, or if you have a lot of readings over 200. We will need to take this complex situation in stages.   For now, please:  Increase the humalog, and:  Decrease the Lantus, and: Increase the Victoza and:  Please continue the same other diabetes medications for now.   Please come back for a follow-up appointment in 2 months.

## 2020-06-17 NOTE — Progress Notes (Signed)
Subjective:    Patient ID: Erik Hancock, male    DOB: 30-Mar-1951, 69 y.o.   MRN: [id number]  HPI Pt returns for f/u of diabetes mellitus: DM type: Insulin-requiring type 2 Dx'ed: 2002 Complications: PN, DR, CAD, and CRI Therapy: insulin since 2018, Victoza, and 2 oral meds.   DKA: never Severe hypoglycemia: never Pancreatitis: never Pancreatic imaging: normal on 2017 CT Other: he takes multiple daily injections Interval history: no cbg record, but states cbg varies from 65-188.  It is in general higher as the day goes on, but not necessarily so.  pt states he feels well in general.   Past Medical History:  Diagnosis Date  . Arthritis    "all over" (07/29/2018)  . CAD S/P percutaneous coronary angioplasty    a. stent to mLAD 11/2001. b. DES to PDA/mLCx 2007. c. DES to ostial LAD 07/2018.  [name] Chronic lower back pain   . CKD (chronic kidney disease), stage III   . CKD (chronic kidney disease), stage III   . Diabetic retinopathy ([facility])   . GERD (gastroesophageal reflux disease)   . High cholesterol   . History of kidney stones   . Hypertension   . Mobitz type 2 second degree atrioventricular block    a. noted during 07/2018 adm, metoprolol discontinued.  . Morbid obesity ([facility]) 07/28/2018  . Prostatitis   . Pulmonary nodule 2013   CT  . Sleep apnea    stopbang=5  . Suspected sleep apnea   . Type II diabetes mellitus ([facility])     Past Surgical History:  Procedure Laterality Date  . BACK SURGERY    . CARDIAC CATHETERIZATION  JUNE 2003   PATENT LAD STENT/ BODERLINE OBSTRUCTIVE DISEASE POSTERIOR DESCENDING ARTERY/ NORMAL LVF  . CATARACT EXTRACTION W/ INTRAOCULAR LENS  IMPLANT, BILATERAL Bilateral 2017  . CERVICAL SCOVILLE FORAMINOTOMY W/ EXCISION OF HERNIATED NUCLEC PULPOSUS  2009   C6 - 7  . CORONARY ANGIOPLASTY WITH STENT PLACEMENT  03/30/2006   DR EDMUNDS - 90% LCx@OM2  TAXUS  DES 3.0 X 20 -> 3.25 mm,   95% RPDA -- TAXUS DES 3.0 X 16 --> 3.5 mm  . CORONARY ANGIOPLASTY WITH STENT  PLACEMENT  11/2001   (Dr. Ty Hilts for Dr. Mayford Knife) - mLAD@D2  - TAXUS EXPRESS DES 3.5 x 15   . CORONARY ANGIOPLASTY WITH STENT PLACEMENT  07/29/2018  . CORONARY STENT INTERVENTION N/A 07/29/2018   Procedure: CORONARY STENT INTERVENTION;  Surgeon: Marykay Lex, MD;  Location: [facility];  Service: Cardiovascular;  Laterality: N/A;  . CYSTOSCOPY W/ RETROGRADES  05/04/2012   Procedure: CYSTOSCOPY WITH RETROGRADE PYELOGRAM;  Surgeon: Milford Cage, MD;  Location: [facility];  Service: Urology;  Laterality: Left;  . CYSTOSCOPY W/ URETERAL STENT PLACEMENT  05/04/2012   Procedure: CYSTOSCOPY WITH STENT REPLACEMENT;  Surgeon: Milford Cage, MD;  Location: [facility];  Service: Urology;  Laterality: Left;  . CYSTOSCOPY WITH STENT PLACEMENT Left 04/04/2012  . CYSTOSCOPY WITH URETEROSCOPY  05/04/2012   Procedure: CYSTOSCOPY WITH URETEROSCOPY;  Surgeon: Milford Cage, MD;  Location: [facility];  Service: Urology;  Laterality: Left;      . LEFT HEART CATH AND CORONARY ANGIOGRAPHY N/A 07/29/2018   Procedure: LEFT HEART CATH AND CORONARY ANGIOGRAPHY;  Surgeon: Marykay Lex, MD;  Location: [facility];  Service: Cardiovascular;  Laterality: N/A;  . LEFT URETEROSCOPIC STONE EXTRACTION  03-14-2003   X2    Social History   Socioeconomic History  . Marital status: Married  Spouse name: Not on file  . Number of children: Not on file  . Years of education: Not on file  . Highest education level: Not on file  Occupational History  . Not on file  Tobacco Use  . Smoking status: Never Smoker  . Smokeless tobacco: Never Used  Vaping Use  . Vaping Use: Never used  Substance and Sexual Activity  . Alcohol use: Never  . Drug use: Never  . Sexual activity: Not Currently  Other Topics Concern  . Not on file  Social History Narrative  . Not on file   Social Determinants of Health   Financial Resource Strain:   . Difficulty of Paying Living Expenses:   Food Insecurity:   .  Worried About Programme researcher, broadcasting/film/video in the Last Year:   . Barista in the Last Year:   Transportation Needs:   . Freight forwarder (Medical):   [name] Lack of Transportation (Non-Medical):   Physical Activity:   . Days of Exercise per Week:   . Minutes of Exercise per Session:   Stress:   . Feeling of Stress :   Social Connections:   . Frequency of Communication with Friends and Family:   . Frequency of Social Gatherings with Friends and Family:   . Attends Religious Services:   . Active Member of Clubs or Organizations:   . Attends Banker Meetings:   [name] Marital Status:   Intimate Partner Violence:   . Fear of Current or Ex-Partner:   . Emotionally Abused:   [name] Physically Abused:   . Sexually Abused:     Current Outpatient Medications on File Prior to Visit  Medication Sig Dispense Refill  . amLODipine (NORVASC) 10 MG tablet Take 1 tablet (10 mg total) by mouth daily. 90 tablet 3  . aspirin 81 MG tablet Take 81 mg by mouth daily.    [name] atorvastatin (LIPITOR) 80 MG tablet Take 1 tablet (80 mg total) by mouth daily. 90 tablet 1  . gabapentin (NEURONTIN) 300 MG capsule Take 1 capsule (300 mg total) by mouth 3 (three) times daily. 90 capsule 3  . HYDROcodone-acetaminophen (NORCO) 5-325 MG tablet Take 1 tablet by mouth every 6 (six) hours as needed for moderate pain. 30 tablet 0  . JARDIANCE 25 MG TABS tablet Take 1 tablet by mouth once daily 90 tablet 2  . lisinopril (ZESTRIL) 20 MG tablet Take 1 tablet by mouth once daily 90 tablet 0  . metFORMIN (GLUCOPHAGE) 1000 MG tablet Take 1 tablet (1,000 mg total) by mouth daily with breakfast. 90 tablet 3  . nitroGLYCERIN (NITROLINGUAL) 0.4 MG/SPRAY spray Place 1 spray under the tongue every 5 (five) minutes x 3 doses as needed for chest pain. 12 g 12  . pantoprazole (PROTONIX) 40 MG tablet Take 1 tablet by mouth once daily 90 tablet 3  . ticagrelor (BRILINTA) 60 MG TABS tablet Take 1 tablet (60 mg total) by mouth 2 (two)  times daily. Please keep upcoming appt for refills. Thank you 180 tablet 3   No current facility-administered medications on file prior to visit.    Allergies  Allergen Reactions  . Beta Adrenergic Blockers     Metoprolol stopped 07/2018 due to 2nd degree type 2 AV block.    Family History  Problem Relation Age of Onset  . Hypertension Mother   . Heart attack Father   . Heart disease Father   . Alzheimer's disease Father   . Diabetes Father   .  Heart attack Paternal Grandfather   . Heart disease Paternal Grandfather   . Lupus Sister     BP (!) 142/66   Pulse 80   Ht 5' 9.5" (1.765 m)   Wt (!) 291 lb 6.4 oz (132.2 kg)   SpO2 98%   BMI 42.42 kg/m    Review of Systems     Objective:   Physical Exam VITAL SIGNS:  See vs page GENERAL: no distress Pulses: dorsalis pedis intact bilat.   MSK: no deformity of the feet CV: trace bilat leg edema Skin:  no ulcer on the feet.  normal color and temp on the feet. Neuro: sensation is intact to touch on the feet, but decreased from normal.   Ext: there is bilateral onychomycosis of the toenails.    Lab Results  Component Value Date   CREATININE 1.19 08/21/2019   BUN 15 08/21/2019   NA 138 08/21/2019   K 4.6 08/21/2019   CL 105 08/21/2019   CO2 21 08/21/2019    Lab Results  Component Value Date   HGBA1C 7.6 (A) 06/17/2020        Assessment & Plan:  Insulin-requiring type 2 DM, with CRI: he needs increased rx.   Hypoglycemia, due to insulin: this limits aggressiveness of glycemic control.  We'll favor increasing Victoza.    Patient Instructions  check your blood sugar twice a day.  vary the time of day when you check, between before the 3 meals, and at bedtime.  also check if you have symptoms of your blood sugar being too high or too low.  please keep a record of the readings and bring it to your next appointment here (or you can bring the meter itself).  You can write it on any piece of paper.  please call us sooner  if your blood sugar goes below 70, or if you have a lot of readings over 200. We will need to take this complex situation in stages.   For now, please:  Increase the humalog, and:  Decrease the Lantus, and: Increase the Victoza and:  Please continue the same other diabetes medications for now.   Please come back for a follow-up appointment in 2 months.

## 2020-07-04 ENCOUNTER — Ambulatory Visit (INDEPENDENT_AMBULATORY_CARE_PROVIDER_SITE_OTHER): Payer: Medicare PPO | Admitting: Family Medicine

## 2020-07-04 ENCOUNTER — Other Ambulatory Visit: Payer: Self-pay

## 2020-07-04 VITALS — BP 110/68 | HR 81 | Temp 98.2°F | Ht 69.0 in | Wt 283.0 lb

## 2020-07-04 DIAGNOSIS — E7800 Pure hypercholesterolemia, unspecified: Secondary | ICD-10-CM | POA: Diagnosis not present

## 2020-07-04 DIAGNOSIS — E118 Type 2 diabetes mellitus with unspecified complications: Secondary | ICD-10-CM

## 2020-07-04 DIAGNOSIS — Z125 Encounter for screening for malignant neoplasm of prostate: Secondary | ICD-10-CM | POA: Diagnosis not present

## 2020-07-04 DIAGNOSIS — I10 Essential (primary) hypertension: Secondary | ICD-10-CM | POA: Diagnosis not present

## 2020-07-04 DIAGNOSIS — Z0000 Encounter for general adult medical examination without abnormal findings: Secondary | ICD-10-CM

## 2020-07-04 DIAGNOSIS — Z0001 Encounter for general adult medical examination with abnormal findings: Secondary | ICD-10-CM | POA: Diagnosis not present

## 2020-07-04 DIAGNOSIS — I2510 Atherosclerotic heart disease of native coronary artery without angina pectoris: Secondary | ICD-10-CM | POA: Diagnosis not present

## 2020-07-04 DIAGNOSIS — Z9861 Coronary angioplasty status: Secondary | ICD-10-CM

## 2020-07-04 DIAGNOSIS — R270 Ataxia, unspecified: Secondary | ICD-10-CM | POA: Diagnosis not present

## 2020-07-04 DIAGNOSIS — E785 Hyperlipidemia, unspecified: Secondary | ICD-10-CM

## 2020-07-04 NOTE — Progress Notes (Signed)
Subjective:    Patient ID: Erik Hancock, male    DOB: Jun 14, 1951, 69 y.o.   MRN: [id number]  Patient is a 69 year old Caucasian male here today for a complete physical exam.  Past medical history significant for coronary artery disease.  He had a stent to the LAD in 2008, stent to the PDA in 2007 and a stent to the LAD in 2019.  He also has a history of hypertension, poorly controlled diabetes, morbid obesity, and chronic kidney disease.  Had a Cologuard last year that was negative.  Therefore he is not due for Cologuard for 2 more years.  He is due for prostate cancer screening.  Immunizations are listed below and are up-to-date: Immunization History  Administered Date(s) Administered  . Fluad Quad(high Dose 65+) 08/08/2019  . Hep A / Hep B 01/05/2019, 04/05/2019  . Hepatitis A 01/05/2019  . Hepatitis B 01/05/2019  . Influenza,inj,Quad PF,6+ Mos 09/07/2013, 08/03/2014, 10/22/2015  . Influenza,inj,Quad PF,6-35 Mos 07/06/2018  . Influenza-Unspecified 08/08/2019  . Pneumococcal Conjugate-13 02/11/2017  . Pneumococcal Polysaccharide-23 09/07/2013, 07/06/2018  . Tdap 02/02/2014, 01/05/2019  . Zoster Recombinat (Shingrix) 07/06/2018, 10/31/2018   He has had the Covid vaccine.  He denies any falls, depression, or memory loss.  However he is reporting dizziness.  He states that in a hot shower he will become extremely lightheaded and feel like he is going to fall.  He states that he feels like he easily loses balance.  He denies any falls however he feels very unsteady on his feet.  He does have burning neuropathy in his legs that he takes gabapentin for however he denies numbness in his feet.  He denies any headache or blurry vision or neurologic deficit or slurred speech.  However he does have a worsening essential tremor.  Both hands shake at rest.  Activity makes it worse.  He also experiences shaking in his legs when he tries to hold his foot in an extended position during his diabetic foot  exam.  He does not have a pill-rolling tremor.  He does not have a slow shuffling gait or masslike facies to suggest Parkinson's disease. Past Medical History:  Diagnosis Date  . Arthritis    "all over" (07/29/2018)  . CAD S/P percutaneous coronary angioplasty    a. stent to mLAD 11/2001. b. DES to PDA/mLCx 2007. c. DES to ostial LAD 07/2018.  [name] Chronic lower back pain   . CKD (chronic kidney disease), stage III   . CKD (chronic kidney disease), stage III   . Diabetic retinopathy (HCC)   . GERD (gastroesophageal reflux disease)   . High cholesterol   . History of kidney stones   . Hypertension   . Mobitz type 2 second degree atrioventricular block    a. noted during 07/2018 adm, metoprolol discontinued.  . Morbid obesity (HCC) 07/28/2018  . Prostatitis   . Pulmonary nodule 2013   CT  . Sleep apnea    stopbang=5  . Suspected sleep apnea   . Type II diabetes mellitus (HCC)    Past Surgical History:  Procedure Laterality Date  . BACK SURGERY    . CARDIAC CATHETERIZATION  JUNE 2003   PATENT LAD STENT/ BODERLINE OBSTRUCTIVE DISEASE POSTERIOR DESCENDING ARTERY/ NORMAL LVF  . CATARACT EXTRACTION W/ INTRAOCULAR LENS  IMPLANT, BILATERAL Bilateral 2017  . CERVICAL SCOVILLE FORAMINOTOMY W/ EXCISION OF HERNIATED NUCLEC PULPOSUS  2009   C6 - 7  . CORONARY ANGIOPLASTY WITH STENT PLACEMENT  03/30/2006   DR ACZYSAY -  90% LCx@OM2  TAXUS  DES 3.0 X 20 -> 3.25 mm,   95% RPDA -- TAXUS DES 3.0 X 16 --> 3.5 mm  . CORONARY ANGIOPLASTY WITH STENT PLACEMENT  11/2001   (Dr. Ty HiltsEdmonds for Dr. Mayford Knifeurner) - mLAD@D2  - TAXUS EXPRESS DES 3.5 x 15   . CORONARY ANGIOPLASTY WITH STENT PLACEMENT  07/29/2018  . CORONARY STENT INTERVENTION N/A 07/29/2018   Procedure: CORONARY STENT INTERVENTION;  Surgeon: Marykay LexHarding, David W, MD;  [facility];  Service: Cardiovascular;  Laterality: N/A;  . CYSTOSCOPY W/ RETROGRADES  05/04/2012   Procedure: CYSTOSCOPY WITH RETROGRADE PYELOGRAM;  Surgeon: Milford Cageaniel Young Woodruff, MD;   Location: [facility];  Service: Urology;  Laterality: Left;  . CYSTOSCOPY W/ URETERAL STENT PLACEMENT  05/04/2012   Procedure: CYSTOSCOPY WITH STENT REPLACEMENT;  Surgeon: Milford Cageaniel Young Woodruff, MD;  Location: [facility];  Service: Urology;  Laterality: Left;  . CYSTOSCOPY WITH STENT PLACEMENT Left 04/04/2012  . CYSTOSCOPY WITH URETEROSCOPY  05/04/2012   Procedure: CYSTOSCOPY WITH URETEROSCOPY;  Surgeon: Milford Cageaniel Young Woodruff, MD;  Location: [facility];  Service: Urology;  Laterality: Left;      . LEFT HEART CATH AND CORONARY ANGIOGRAPHY N/A 07/29/2018   Procedure: LEFT HEART CATH AND CORONARY ANGIOGRAPHY;  Surgeon: Marykay LexHarding, David W, MD;  [facility];  Service: Cardiovascular;  Laterality: N/A;  . LEFT URETEROSCOPIC STONE EXTRACTION  03-14-2003   X2   Current Outpatient Medications on File Prior to Visit  Medication Sig Dispense Refill  . aspirin 81 MG tablet Take 81 mg by mouth daily.    [name]. atorvastatin (LIPITOR) 80 MG tablet Take 1 tablet (80 mg total) by mouth daily. 90 tablet 1  . gabapentin (NEURONTIN) 300 MG capsule Take 1 capsule (300 mg total) by mouth 3 (three) times daily. 90 capsule 3  . glucose blood (ACCU-CHEK AVIVA PLUS) test strip 1 each by Other route in the morning and at bedtime. And lancets 2/day 180 each 3  . HYDROcodone-acetaminophen (NORCO) 5-325 MG tablet Take 1 tablet by mouth every 6 (six) hours as needed for moderate pain. 30 tablet 0  . insulin glargine (LANTUS SOLOSTAR) 100 UNIT/ML Solostar Pen Inject 28 Units into the skin daily. 30 mL 3  . insulin lispro (HUMALOG KWIKPEN) 100 UNIT/ML KwikPen Inject 0.2 mLs (20 Units total) into the skin 2 (two) times daily with a meal. 5 pen 11  . Insulin Pen Needle (BD ULTRA-FINE PEN NEEDLES) 29G X 12.7MM MISC 1 Device by Other route in the morning, at noon, in the evening, and at bedtime. 360 each 3  . JARDIANCE 25 MG TABS tablet Take 1 tablet by mouth once daily 90 tablet 2  . liraglutide (VICTOZA) 18 MG/3ML SOPN Inject 0.3  mLs (1.8 mg total) into the skin daily. 18 mL 3  . lisinopril (ZESTRIL) 20 MG tablet Take 1 tablet by mouth once daily 90 tablet 0  . metFORMIN (GLUCOPHAGE) 1000 MG tablet Take 1 tablet (1,000 mg total) by mouth daily with breakfast. 90 tablet 3  . nitroGLYCERIN (NITROLINGUAL) 0.4 MG/SPRAY spray Place 1 spray under the tongue every 5 (five) minutes x 3 doses as needed for chest pain. 12 g 12  . pantoprazole (PROTONIX) 40 MG tablet Take 1 tablet by mouth once daily 90 tablet 3  . ticagrelor (BRILINTA) 60 MG TABS tablet Take 1 tablet (60 mg total) by mouth 2 (two) times daily. Please keep upcoming appt for refills. Thank you 180 tablet 3   No current facility-administered medications  on file prior to visit.   Allergies  Allergen Reactions  . Beta Adrenergic Blockers     Metoprolol stopped 07/2018 due to 2nd degree type 2 AV block.   Social History   Socioeconomic History  . Marital status: Married    Spouse name: Not on file  . Number of children: Not on file  . Years of education: Not on file  . Highest education level: Not on file  Occupational History  . Not on file  Tobacco Use  . Smoking status: Never Smoker  . Smokeless tobacco: Never Used  Vaping Use  . Vaping Use: Never used  Substance and Sexual Activity  . Alcohol use: Never  . Drug use: Never  . Sexual activity: Not Currently  Other Topics Concern  . Not on file  Social History Narrative  . Not on file   Social Determinants of Health   Financial Resource Strain:   . Difficulty of Paying Living Expenses:   Food Insecurity:   . Worried About Programme researcher, broadcasting/film/video in the Last Year:   . Barista in the Last Year:   Transportation Needs:   . Freight forwarder (Medical):   [name] Lack of Transportation (Non-Medical):   Physical Activity:   . Days of Exercise per Week:   . Minutes of Exercise per Session:   Stress:   . Feeling of Stress :   Social Connections:   . Frequency of Communication with Friends and  Family:   . Frequency of Social Gatherings with Friends and Family:   . Attends Religious Services:   . Active Member of Clubs or Organizations:   . Attends Banker Meetings:   [name] Marital Status:   Intimate Partner Violence:   . Fear of Current or Ex-Partner:   . Emotionally Abused:   [name] Physically Abused:   . Sexually Abused:       Review of Systems  All other systems reviewed and are negative.      Objective:   Physical Exam Vitals reviewed.  Constitutional:      General: He is not in acute distress.    Appearance: He is well-developed. He is obese. He is not ill-appearing, toxic-appearing or diaphoretic.  HENT:     Head: Normocephalic and atraumatic.     Right Ear: Tympanic membrane and ear canal normal.     Left Ear: Ear canal normal.     Nose: Nose normal. No congestion or rhinorrhea.  Eyes:     General: No scleral icterus.       Right eye: No discharge.        Left eye: No discharge.     Extraocular Movements: Extraocular movements intact.     Conjunctiva/sclera: Conjunctivae normal.     Pupils: Pupils are equal, round, and reactive to light.  Neck:     Thyroid: No thyromegaly.     Vascular: No JVD.  Cardiovascular:     Rate and Rhythm: Normal rate and regular rhythm.     Heart sounds: Normal heart sounds. No murmur heard.   Pulmonary:     Effort: Pulmonary effort is normal. No respiratory distress.     Breath sounds: Normal breath sounds. No wheezing or rales.  Chest:     Chest wall: No tenderness.  Abdominal:     General: Bowel sounds are normal. There is no distension.     Palpations: Abdomen is soft. There is no mass.     Tenderness: There is  no abdominal tenderness. There is no guarding or rebound.  Musculoskeletal:     Cervical back: Neck supple.  Lymphadenopathy:     Cervical: No cervical adenopathy.  Skin:    Findings: No erythema or rash.  Neurological:     General: No focal deficit present.     Mental Status: He is alert and  oriented to person, place, and time. Mental status is at baseline.     Cranial Nerves: No cranial nerve deficit.     Sensory: No sensory deficit.     Motor: No weakness.     Coordination: Coordination abnormal.     Gait: Gait normal.     Deep Tendon Reflexes: Reflexes normal.  Psychiatric:        Mood and Affect: Mood normal.        Behavior: Behavior normal.        Thought Content: Thought content normal.        Judgment: Judgment normal.   Patient is unable to perform heel-to-toe walk in the hallway without falling into the wall.  He also has a difficult time performing Romberg testing.  When he closes his eyes he loses his balance and stumbles forward        Assessment & Plan:   Controlled type 2 diabetes mellitus with complication, without long-term current use of insulin (HCC) - Plan: CBC with Differential/Platelet, Lipid panel, COMPLETE METABOLIC PANEL WITH GFR, Microalbumin, urine  Prostate cancer screening - Plan: PSA  Essential hypertension  Pure hypercholesterolemia  CAD S/P percutaneous coronary angioplasty  Morbid obesity (HCC)  Dyslipidemia, goal LDL below 70  General medical exam  Ataxia  Differential diagnosis for the dizziness includes multifactorial causes such as obesity, age, deconditioning, and peripheral neuropathy combining together to create an sense of imbalance, stroke, less likely Parkinson's disease given the tremor.  Begin by obtaining an MRI given his vascular risk factors.  Cancer screening including colon cancer screening is up-to-date.  Check a PSA for prostate cancer screening.  Blood pressure is acceptable.  Diabetes is managed by his endocrinologist.  Check fasting lipid panel.

## 2020-07-05 LAB — CBC WITH DIFFERENTIAL/PLATELET
Absolute Monocytes: 716 cells/uL (ref 200–950)
Basophils Absolute: 39 cells/uL (ref 0–200)
Basophils Relative: 0.5 %
Eosinophils Absolute: 270 cells/uL (ref 15–500)
Eosinophils Relative: 3.5 %
HCT: 45.6 % (ref 38.5–50.0)
Hemoglobin: 14.9 g/dL (ref 13.2–17.1)
Lymphs Abs: 1555 cells/uL (ref 850–3900)
MCH: 28.2 pg (ref 27.0–33.0)
MCHC: 32.7 g/dL (ref 32.0–36.0)
MCV: 86.4 fL (ref 80.0–100.0)
MPV: 8.6 fL (ref 7.5–12.5)
Monocytes Relative: 9.3 %
Neutro Abs: 5121 cells/uL (ref 1500–7800)
Neutrophils Relative %: 66.5 %
Platelets: 317 10*3/uL (ref 140–400)
RBC: 5.28 10*6/uL (ref 4.20–5.80)
RDW: 13.8 % (ref 11.0–15.0)
Total Lymphocyte: 20.2 %
WBC: 7.7 10*3/uL (ref 3.8–10.8)

## 2020-07-05 LAB — PSA: PSA: 0.6 ng/mL (ref <=4.0)

## 2020-07-05 LAB — LIPID PANEL
Cholesterol: 85 mg/dL (ref <200)
HDL: 44 mg/dL (ref >=40)
LDL Cholesterol (Calc): 22 mg/dL (calc)
Non-HDL Cholesterol (Calc): 41 mg/dL (calc) (ref <130)
Total CHOL/HDL Ratio: 1.9 (calc) (ref <5.0)
Triglycerides: 109 mg/dL (ref <150)

## 2020-07-05 LAB — COMPLETE METABOLIC PANEL WITH GFR
AG Ratio: 1.7 (calc) (ref 1.0–2.5)
ALT: 36 U/L (ref 9–46)
AST: 35 U/L (ref 10–35)
Albumin: 4.4 g/dL (ref 3.6–5.1)
Alkaline phosphatase (APISO): 81 U/L (ref 35–144)
BUN/Creatinine Ratio: 13 (calc) (ref 6–22)
BUN: 18 mg/dL (ref 7–25)
CO2: 22 mmol/L (ref 20–32)
Calcium: 10 mg/dL (ref 8.6–10.3)
Chloride: 108 mmol/L (ref 98–110)
Creat: 1.38 mg/dL — ABNORMAL HIGH (ref 0.70–1.25)
GFR, Est African American: 60 mL/min/{1.73_m2} (ref >=60)
GFR, Est Non African American: 52 mL/min/{1.73_m2} — ABNORMAL LOW (ref >=60)
Globulin: 2.6 g/dL (calc) (ref 1.9–3.7)
Glucose, Bld: 100 mg/dL — ABNORMAL HIGH (ref 65–99)
Potassium: 4.6 mmol/L (ref 3.5–5.3)
Sodium: 139 mmol/L (ref 135–146)
Total Bilirubin: 0.9 mg/dL (ref 0.2–1.2)
Total Protein: 7 g/dL (ref 6.1–8.1)

## 2020-07-05 LAB — MICROALBUMIN, URINE: Microalb, Ur: 0.2 mg/dL

## 2020-07-08 ENCOUNTER — Other Ambulatory Visit: Payer: Self-pay | Admitting: Family Medicine

## 2020-07-08 DIAGNOSIS — I2510 Atherosclerotic heart disease of native coronary artery without angina pectoris: Secondary | ICD-10-CM

## 2020-07-08 DIAGNOSIS — R270 Ataxia, unspecified: Secondary | ICD-10-CM

## 2020-07-08 DIAGNOSIS — E118 Type 2 diabetes mellitus with unspecified complications: Secondary | ICD-10-CM

## 2020-07-15 ENCOUNTER — Encounter (INDEPENDENT_AMBULATORY_CARE_PROVIDER_SITE_OTHER): Payer: Medicare PPO | Admitting: Ophthalmology

## 2020-07-15 ENCOUNTER — Other Ambulatory Visit: Payer: Self-pay

## 2020-07-15 DIAGNOSIS — I10 Essential (primary) hypertension: Secondary | ICD-10-CM | POA: Diagnosis not present

## 2020-07-15 DIAGNOSIS — H43813 Vitreous degeneration, bilateral: Secondary | ICD-10-CM

## 2020-07-15 DIAGNOSIS — H35033 Hypertensive retinopathy, bilateral: Secondary | ICD-10-CM | POA: Diagnosis not present

## 2020-07-15 DIAGNOSIS — E11311 Type 2 diabetes mellitus with unspecified diabetic retinopathy with macular edema: Secondary | ICD-10-CM

## 2020-07-15 DIAGNOSIS — E113313 Type 2 diabetes mellitus with moderate nonproliferative diabetic retinopathy with macular edema, bilateral: Secondary | ICD-10-CM | POA: Diagnosis not present

## 2020-07-26 ENCOUNTER — Other Ambulatory Visit: Payer: Medicare PPO

## 2020-07-28 ENCOUNTER — Other Ambulatory Visit: Payer: Medicare PPO

## 2020-07-31 ENCOUNTER — Other Ambulatory Visit: Payer: Medicare PPO

## 2020-08-02 ENCOUNTER — Ambulatory Visit
Admission: RE | Admit: 2020-08-02 | Discharge: 2020-08-02 | Disposition: A | Discharge status: Home / Self Care | Payer: Medicare PPO | Source: Ambulatory Visit | Attending: Family Medicine | Admitting: Family Medicine

## 2020-08-02 ENCOUNTER — Other Ambulatory Visit: Payer: Self-pay

## 2020-08-02 DIAGNOSIS — E118 Type 2 diabetes mellitus with unspecified complications: Secondary | ICD-10-CM

## 2020-08-02 DIAGNOSIS — Z9861 Coronary angioplasty status: Secondary | ICD-10-CM

## 2020-08-02 DIAGNOSIS — R270 Ataxia, unspecified: Secondary | ICD-10-CM

## 2020-08-03 ENCOUNTER — Ambulatory Visit
Admission: RE | Admit: 2020-08-03 | Discharge: 2020-08-03 | Disposition: A | Discharge status: Home / Self Care | Payer: Medicare PPO | Source: Ambulatory Visit | Attending: Family Medicine | Admitting: Family Medicine

## 2020-08-03 DIAGNOSIS — R251 Tremor, unspecified: Secondary | ICD-10-CM | POA: Diagnosis not present

## 2020-08-03 DIAGNOSIS — J329 Chronic sinusitis, unspecified: Secondary | ICD-10-CM | POA: Diagnosis not present

## 2020-08-03 DIAGNOSIS — Z9861 Coronary angioplasty status: Secondary | ICD-10-CM

## 2020-08-03 DIAGNOSIS — H748X3 Other specified disorders of middle ear and mastoid, bilateral: Secondary | ICD-10-CM | POA: Diagnosis not present

## 2020-08-03 DIAGNOSIS — R270 Ataxia, unspecified: Secondary | ICD-10-CM | POA: Diagnosis not present

## 2020-08-03 DIAGNOSIS — E118 Type 2 diabetes mellitus with unspecified complications: Secondary | ICD-10-CM

## 2020-08-03 MED ORDER — GADOBENATE DIMEGLUMINE 529 MG/ML IV SOLN
20.0000 mL | Freq: Once | INTRAVENOUS | Status: AC | PRN
  Administered 2020-08-03: 20 mL via INTRAVENOUS

## 2020-08-05 ENCOUNTER — Other Ambulatory Visit: Payer: Self-pay | Admitting: Family Medicine

## 2020-08-05 DIAGNOSIS — Z8673 Personal history of transient ischemic attack (TIA), and cerebral infarction without residual deficits: Secondary | ICD-10-CM

## 2020-08-06 ENCOUNTER — Other Ambulatory Visit: Payer: Self-pay

## 2020-08-06 ENCOUNTER — Other Ambulatory Visit: Payer: Self-pay | Admitting: Family Medicine

## 2020-08-06 DIAGNOSIS — E1122 Type 2 diabetes mellitus with diabetic chronic kidney disease: Secondary | ICD-10-CM

## 2020-08-06 MED ORDER — LANTUS SOLOSTAR 100 UNIT/ML ~~LOC~~ SOPN: 28.0000 [IU] | PEN_INJECTOR | Freq: Every day | SUBCUTANEOUS | 2 refills | Status: DC

## 2020-08-09 ENCOUNTER — Ambulatory Visit (INDEPENDENT_AMBULATORY_CARE_PROVIDER_SITE_OTHER): Payer: Medicare PPO | Admitting: Family Medicine

## 2020-08-09 ENCOUNTER — Other Ambulatory Visit: Payer: Self-pay

## 2020-08-09 VITALS — BP 136/70 | HR 86 | Temp 97.9°F | Resp 18 | Wt 276.0 lb

## 2020-08-09 DIAGNOSIS — G250 Essential tremor: Secondary | ICD-10-CM

## 2020-08-09 DIAGNOSIS — Z8673 Personal history of transient ischemic attack (TIA), and cerebral infarction without residual deficits: Secondary | ICD-10-CM | POA: Diagnosis not present

## 2020-08-09 NOTE — Progress Notes (Signed)
Subjective:    Patient ID: Erik Hancock, male    DOB: 1951/11/15, 69 y.o.   MRN: [id number] 07/04/20 Patient is a 69 year old Caucasian male here today for a complete physical exam.  Past medical history significant for coronary artery disease.  He had a stent to the LAD in 2008, stent to the PDA in 2007 and a stent to the LAD in 2019.  He also has a history of hypertension, poorly controlled diabetes, morbid obesity, and chronic kidney disease.  Had a Cologuard last year that was negative.  Therefore he is not due for Cologuard for 2 more years.  He is due for prostate cancer screening.  He has had the Covid vaccine.  He denies any falls, depression, or memory loss.  However he is reporting dizziness.  He states that in a hot shower he will become extremely lightheaded and feel like he is going to fall.  He states that he feels like he easily loses balance.  He denies any falls however he feels very unsteady on his feet.  He does have burning neuropathy in his legs that he takes gabapentin for however he denies numbness in his feet.  He denies any headache or blurry vision or neurologic deficit or slurred speech.  However he does have a worsening essential tremor.  Both hands shake at rest.  Activity makes it worse.  He also experiences shaking in his legs when he tries to hold his foot in an extended position during his diabetic foot exam.  He does not have a pill-rolling tremor.  He does not have a slow shuffling gait or masslike facies to suggest Parkinson's disease.  At that time, my plan was: Differential diagnosis for the dizziness includes multifactorial causes such as obesity, age, deconditioning, and peripheral neuropathy combining together to create an sense of imbalance, stroke, less likely Parkinson's disease given the tremor.  Begin by obtaining an MRI given his vascular risk factors.  Cancer screening including colon cancer screening is up-to-date.  Check a PSA for prostate cancer screening.   Blood pressure is acceptable.  Diabetes is managed by his endocrinologist.  Check fasting lipid panel.  08/09/20 Patient had an MRI of the brain that showed 2 lacunar infarcts in the right cerebellum.  He is here today to discuss further.  He continues to be concerned about the tremor.  The tremor occurs now primarily with activity.  He states that if he is holding a coffee cup, his hand will begin to shake to the point that he will start to spill coffee.  At times his head will start to shake.  I believe most likely the tremor is due to an essential tremor.  However I do believe his balance issues could be a result of the right cerebellar infarct.  He has an MRA scheduled of the head and neck for October.  However he is concerned primarily about the tremor and options to manage it.  Regarding secondary prevention of stroke, he is on dual antiplatelet therapy already.  His blood pressures well controlled.  His cholesterol LDL was well below 70.  His A1c could be better at 7.6 however he is seeing endocrinology. Past Medical History:  Diagnosis Date  . Arthritis    "all over" (07/29/2018)  . CAD S/P percutaneous coronary angioplasty    a. stent to mLAD 11/2001. b. DES to PDA/mLCx 2007. c. DES to ostial LAD 07/2018.  [name] Chronic lower back pain   . CKD (chronic kidney disease),  stage III   . CKD (chronic kidney disease), stage III   . Diabetic retinopathy (HCC)   . GERD (gastroesophageal reflux disease)   . High cholesterol   . History of kidney stones   . Hypertension   . Mobitz type 2 second degree atrioventricular block    a. noted during 07/2018 adm, metoprolol discontinued.  . Morbid obesity (HCC) 07/28/2018  . Prostatitis   . Pulmonary nodule 2013   CT  . Sleep apnea    stopbang=5  . Suspected sleep apnea   . Type II diabetes mellitus (HCC)    Past Surgical History:  Procedure Laterality Date  . BACK SURGERY    . CARDIAC CATHETERIZATION  JUNE 2003   PATENT LAD STENT/ BODERLINE OBSTRUCTIVE  DISEASE POSTERIOR DESCENDING ARTERY/ NORMAL LVF  . CATARACT EXTRACTION W/ INTRAOCULAR LENS  IMPLANT, BILATERAL Bilateral 2017  . CERVICAL SCOVILLE FORAMINOTOMY W/ EXCISION OF HERNIATED NUCLEC PULPOSUS  2009   C6 - 7  . CORONARY ANGIOPLASTY WITH STENT PLACEMENT  03/30/2006   DR EDMUNDS - 90% LCx@OM2  TAXUS  DES 3.0 X 20 -> 3.25 mm,   95% RPDA -- TAXUS DES 3.0 X 16 --> 3.5 mm  . CORONARY ANGIOPLASTY WITH STENT PLACEMENT  11/2001   (Dr. Ty HiltsEdmonds for Dr. Mayford Knifeurner) - mLAD@D2  - TAXUS EXPRESS DES 3.5 x 15   . CORONARY ANGIOPLASTY WITH STENT PLACEMENT  07/29/2018  . CORONARY STENT INTERVENTION N/A 07/29/2018   Procedure: CORONARY STENT INTERVENTION;  Surgeon: Marykay LexHarding, David W, MD;  [facility];  Service: Cardiovascular;  Laterality: N/A;  . CYSTOSCOPY W/ RETROGRADES  05/04/2012   Procedure: CYSTOSCOPY WITH RETROGRADE PYELOGRAM;  Surgeon: Milford Cageaniel Young Woodruff, MD;  Location: [facility];  Service: Urology;  Laterality: Left;  . CYSTOSCOPY W/ URETERAL STENT PLACEMENT  05/04/2012   Procedure: CYSTOSCOPY WITH STENT REPLACEMENT;  Surgeon: Milford Cageaniel Young Woodruff, MD;  Location: [facility];  Service: Urology;  Laterality: Left;  . CYSTOSCOPY WITH STENT PLACEMENT Left 04/04/2012  . CYSTOSCOPY WITH URETEROSCOPY  05/04/2012   Procedure: CYSTOSCOPY WITH URETEROSCOPY;  Surgeon: Milford Cageaniel Young Woodruff, MD;  Location: [facility];  Service: Urology;  Laterality: Left;      . LEFT HEART CATH AND CORONARY ANGIOGRAPHY N/A 07/29/2018   Procedure: LEFT HEART CATH AND CORONARY ANGIOGRAPHY;  Surgeon: Marykay LexHarding, David W, MD;  [facility];  Service: Cardiovascular;  Laterality: N/A;  . LEFT URETEROSCOPIC STONE EXTRACTION  03-14-2003   X2   Current Outpatient Medications on File Prior to Visit  Medication Sig Dispense Refill  . aspirin 81 MG tablet Take 81 mg by mouth daily.    [name]. atorvastatin (LIPITOR) 80 MG tablet Take 1 tablet (80 mg total) by mouth daily. 90 tablet 1  . gabapentin (NEURONTIN) 300 MG capsule  Take 1 capsule (300 mg total) by mouth 3 (three) times daily. 90 capsule 3  . glucose blood (ACCU-CHEK AVIVA PLUS) test strip 1 each by Other route in the morning and at bedtime. And lancets 2/day 180 each 3  . HYDROcodone-acetaminophen (NORCO) 5-325 MG tablet Take 1 tablet by mouth every 6 (six) hours as needed for moderate pain. 30 tablet 0  . insulin glargine (LANTUS SOLOSTAR) 100 UNIT/ML Solostar Pen Inject 28 Units into the skin daily. 30 mL 2  . insulin lispro (HUMALOG KWIKPEN) 100 UNIT/ML KwikPen Inject 0.2 mLs (20 Units total) into the skin 2 (two) times daily with a meal. 5 pen 11  . Insulin Pen Needle (BD ULTRA-FINE PEN NEEDLES) 29G  X 12. MISC 1 Device by Other route in the morning, at noon, in the evening, and at bedtime. 360 each 3  . JARDIANCE 25 MG TABS tablet Take 1 tablet by mouth once daily 90 tablet 2  . liraglutide (VICTOZA) 18 MG/3ML SOPN Inject 0.3 mLs (1.8 mg total) into the skin daily. 18 mL 3  . lisinopril (ZESTRIL) 20 MG tablet Take 1 tablet by mouth once daily 90 tablet 0  . metFORMIN (GLUCOPHAGE) 1000 MG tablet Take 1 tablet (1,000 mg total) by mouth daily with breakfast. 90 tablet 3  . nitroGLYCERIN (NITROLINGUAL) 0.4 MG/SPRAY spray Place 1 spray under the tongue every 5 (five) minutes x 3 doses as needed for chest pain. 12 g 12  . pantoprazole (PROTONIX) 40 MG tablet Take 1 tablet by mouth once daily 90 tablet 3  . ticagrelor (BRILINTA) 60 MG TABS tablet Take 1 tablet (60 mg total) by mouth 2 (two) times daily. Please keep upcoming appt for refills. Thank you 180 tablet 3   No current facility-administered medications on file prior to visit.   Allergies  Allergen Reactions  . Beta Adrenergic Blockers     Metoprolol stopped 07/2018 due to 2nd degree type 2 AV block.   Social History   Socioeconomic History  . Marital status: Married    Spouse name: Not on file  . Number of children: Not on file  . Years of education: Not on file  . Highest education level:  Not on file  Occupational History  . Not on file  Tobacco Use  . Smoking status: Never Smoker  . Smokeless tobacco: Never Used  Vaping Use  . Vaping Use: Never used  Substance and Sexual Activity  . Alcohol use: Never  . Drug use: Never  . Sexual activity: Not Currently  Other Topics Concern  . Not on file  Social History Narrative  . Not on file   Social Determinants of Health   Financial Resource Strain:   . Difficulty of Paying Living Expenses: Not on file  Food Insecurity:   . Worried About Programme researcher, broadcasting/film/video in the Last Year: Not on file  . Ran Out of Food in the Last Year: Not on file  Transportation Needs:   . Lack of Transportation (Medical): Not on file  . Lack of Transportation (Non-Medical): Not on file  Physical Activity:   . Days of Exercise per Week: Not on file  . Minutes of Exercise per Session: Not on file  Stress:   . Feeling of Stress : Not on file  Social Connections:   . Frequency of Communication with Friends and Family: Not on file  . Frequency of Social Gatherings with Friends and Family: Not on file  . Attends Religious Services: Not on file  . Active Member of Clubs or Organizations: Not on file  . Attends Banker Meetings: Not on file  . Marital Status: Not on file  Intimate Partner Violence:   . Fear of Current or Ex-Partner: Not on file  . Emotionally Abused: Not on file  . Physically Abused: Not on file  . Sexually Abused: Not on file      Review of Systems  All other systems reviewed and are negative.      Objective:   Physical Exam Vitals reviewed.  Constitutional:      General: He is not in acute distress.    Appearance: He is well-developed. He is obese. He is not ill-appearing, toxic-appearing or diaphoretic.  HENT:     Head: Normocephalic and atraumatic.     Right Ear: Tympanic membrane and ear canal normal.     Left Ear: Ear canal normal.     Nose: Nose normal. No congestion or rhinorrhea.  Eyes:      General: No scleral icterus.       Right eye: No discharge.        Left eye: No discharge.     Extraocular Movements: Extraocular movements intact.     Conjunctiva/sclera: Conjunctivae normal.     Pupils: Pupils are equal, round, and reactive to light.  Neck:     Thyroid: No thyromegaly.     Vascular: No JVD.  Cardiovascular:     Rate and Rhythm: Normal rate and regular rhythm.     Heart sounds: Normal heart sounds. No murmur heard.   Pulmonary:     Effort: Pulmonary effort is normal. No respiratory distress.     Breath sounds: Normal breath sounds. No wheezing or rales.  Chest:     Chest wall: No tenderness.  Abdominal:     General: Bowel sounds are normal. There is no distension.     Palpations: Abdomen is soft. There is no mass.     Tenderness: There is no abdominal tenderness. There is no guarding or rebound.  Musculoskeletal:     Cervical back: Neck supple.  Lymphadenopathy:     Cervical: No cervical adenopathy.  Skin:    Findings: No erythema or rash.  Neurological:     General: No focal deficit present.     Mental Status: He is alert and oriented to person, place, and time. Mental status is at baseline.     Cranial Nerves: No cranial nerve deficit.     Sensory: No sensory deficit.     Motor: No weakness.     Coordination: Coordination abnormal.     Gait: Gait normal.     Deep Tendon Reflexes: Reflexes normal.  Psychiatric:        Mood and Affect: Mood normal.        Behavior: Behavior normal.        Thought Content: Thought content normal.        Judgment: Judgment normal.   Patient is unable to perform heel-to-toe walk in the hallway without falling into the wall.  He also has a difficult time performing Romberg testing.  When he closes his eyes he loses his balance and stumbles forward        Assessment & Plan:   Cerebellar cerebrovascular accident (CVA) without late effect  Essential tremor  Await the results of the MRI of his brain and neck however I  believe we have maximized medical therapy to prevent a stroke except for the control of his diabetes.  I encouraged the patient to work with his endocrinologist to try to achieve an A1c less than 7.  Primarily his biggest problem is his dietary indiscretion and lack of exercise.  I have encouraged the patient to gradually try to increase his aerobic exercise to an ultimate goal of 30 minutes a day 5 days a week and to be more consistent with his diet.  Patient would like to meet with a neurologist to discuss the essential tremor.  The only option I can see for the patient will be primidone however I would like a second opinion first given his medical comorbidities.

## 2020-08-15 NOTE — Progress Notes (Signed)
Assessment/Plan:   1.  ET Plus  -Long discussion with patient and wife regarding this diagnosis.  Technically, in order to have essential tremor, the patient must have symptoms for 3 years.  His probably has not been going on that long.  However, he does have a combination of rest and postural tremor.  He has head tremor.    -Discussed various treatments, but he is somewhat limited.  He could probably do a beta-blocker.  (ADDENDUM:  After pt left I reviewed old records and his beta blocker was stopped in a hospital stay due to bradycardia).  He is not really a candidate for primidone because of the Brilinta.  He has had kidney stones so was not a great candidate for topiramate.  Second line drugs could certainly cause him to be more off balance.  Ultimately, the patient decided to just wait and see how things progress.  -Told the patient to call me should things get worse.  -will monitor in future for development of any other neurodegen conditions but don't see that today.  Pt to call me should new s/s develop before next visit.  2.  Diabetic peripheral neuropathy.  -Is likely the source of his loss of balance.  Safety discussed.  3.  Abnormal brain scan  -Suspect that the old lacune's in the right cerebellum are completely asymptomatic.  Reviewed MRI of the brain personally.  The lacunar infarctions are not new.  Patient is on 81 mg aspirin and Lipitor, 80 mg daily.  Patient's LDL is quite low.  Last A1c was 7.6.  Ideally, I would like to see that under 7.  -Patient is noting leg pain.  He actually thought it got somewhat better when he went off of the gabapentin but discussed with the patient that lipitor could be contributing.  Given low chol and LDL numbers, wonder if it could be reduced.  Will leave to PCP/cardiologist as pt with multiple stents.  4.  F/u 6 months  Subjective:   Erik Hancock was seen today in the movement disorders clinic for neurologic consultation at the request of  Donita Brooks, MD.  The consultation is for the evaluation of tremor. This patient is accompanied in the office by his spouse who supplements the history. Outside records that were made available to me were reviewed.  Patient saw his primary care physician in August with complaints of tremor as well as gait instability (known history of neuropathy, diabetes).  MRI brain was ordered at that visit and was completed on August 03, 2020.  I personally reviewed this.  It was nonacute.  There was some old lacune's in the right cerebellum.  Patient has MRA head and neck scheduled for October 5.    He is sent here for the evaluation of tremor.   Tremor: Yes.     How long has it been going on? 1 year  At rest or with activation?  Activation or rest; wife notes most at rest  When is it noted the most?  Carrying something of weight  Fam hx of tremor?  No.  Located where?  Started in hands, now in head.  More in the L than the R hand.  He is R hand dominant  Affected by caffeine:  No. (2-3 cups coffee/day)  Affected by alcohol:  Doesn't drink alcohol  Affected by stress:  Yes.    Affected by fatigue:  No.  Spills soup if on spoon:  No.  Spills glass of liquid  if full:  No.  Affects ADL's (tying shoes, brushing teeth, etc):  No.  Tremor inducing meds:  No.  Other Specific Symptoms:  Voice: no change - "I talk loud" Sleep: trouble staying asleep - gets up to use the RR  Vivid Dreams:  No.  Acting out dreams:  No. Wet Pillows: No. Postural symptoms:  Yes.    Falls?  No. Bradykinesia symptoms: slow movements Loss of smell:  No. Loss of taste:  No. Urinary Incontinence:  No. but has frequency Difficulty Swallowing:  No. Handwriting, micrographia: No., macrographia Trouble with ADL's:  No.  Trouble buttoning clothing: No. Depression:  Yes.   Memory changes:  No. Hallucinations:  No.  visual distortions: No. N/V:  No. Lightheaded:  Yes.    Syncope: No. Diplopia:  No. Dyskinesia:   No.    ALLERGIES:   Allergies  Allergen Reactions  . Beta Adrenergic Blockers     Metoprolol stopped 07/2018 due to 2nd degree type 2 AV block.    CURRENT MEDICATIONS:  Current Outpatient Medications  Medication Instructions  . aspirin 81 mg, Oral, Daily  . atorvastatin (LIPITOR) 80 mg, Oral, Daily  . glucose blood (ACCU-CHEK AVIVA PLUS) test strip 1 each, Other, 2 times daily, And lancets 2/day  . insulin lispro (HUMALOG KWIKPEN) 20 Units, Subcutaneous, 2 times daily with meals  . Insulin Pen Needle (BD ULTRA-FINE PEN NEEDLES) 29G X 12.7MM MISC 1 Device, Other, 4 times daily  . JARDIANCE 25 MG TABS tablet Take 1 tablet by mouth once daily  . Lantus SoloStar 28 Units, Subcutaneous, Daily  . metFORMIN (GLUCOPHAGE) 1,000 mg, Oral, Daily with breakfast  . nitroGLYCERIN (NITROLINGUAL) 0.4 MG/SPRAY spray 1 spray, Sublingual, Every 5 min x3 PRN  . pantoprazole (PROTONIX) 40 MG tablet Take 1 tablet by mouth once daily  . ticagrelor (BRILINTA) 60 mg, Oral, 2 times daily, Please keep upcoming appt for refills. Thank you  . Victoza 1.8 mg, Subcutaneous, Daily    Objective:   PHYSICAL EXAMINATION:    VITALS:   Vitals:   08/19/20 0908  BP: (!) 145/76  Pulse: 83  SpO2: 96%  Weight: 277 lb (125.6 kg)  Height: 5\' 9"  (1.753 m)    GEN:  The patient appears stated age and is in NAD. HEENT:  Normocephalic, atraumatic.  The mucous membranes are moist. The superficial temporal arteries are without ropiness or tenderness. CV:  RRR Lungs:  CTAB Neck/HEME:  There are no carotid bruits bilaterally.  Neurological examination:  Orientation: The patient is alert and oriented x3.  Cranial nerves: There is good facial symmetry.  Extraocular muscles are intact. The visual fields are full to confrontational testing. The speech is fluent and clear. Soft palate rises symmetrically and there is no tongue deviation. Hearing is intact to conversational tone. Sensation: Sensation is intact to light  touch throughout (facial, trunk, extremities). Vibration is absent at the bilateral big toe and overall decreased distally. There is no extinction with double simultaneous stimulation.  Motor: Strength is 5/5 in the bilateral upper and lower extremities.   Shoulder shrug is equal and symmetric.  There is no pronator drift. Deep tendon reflexes: Deep tendon reflexes are 0-1/4 at the bilateral biceps, triceps, brachioradialis, 0/4 patella and achilles. Plantar responses is upgoing on the right (striatal toe) and neutral on the left  Movement examination: Tone: There is normal tone in the bilateral upper extremities.  The tone in the lower extremities is normal.  Abnormal movements: there is LUE rest tremor and with distraction  there is RLE rest tremor.  There is postural tremor on the L>R.  It improves with a weight.  He has trouble pouring water from one glass to another and tremor is actually noted in both hands, left greater than right.  He has mild trouble with Archimedes spirals bilaterally. Coordination:  There is no decremation with RAM's,  Gait and Station: The patient has no difficulty arising out of a deep-seated chair without the use of the hands. The patient's stride length is good but he has trouble ambulating in a tandem fashion.  He is able to stand in the romberg position.  I have reviewed and interpreted the following labs independently   Chemistry      Component Value Date/Time   NA 139 07/04/2020 1005   NA 140 08/02/2018 1208   K 4.6 07/04/2020 1005   CL 108 07/04/2020 1005   CO2 22 07/04/2020 1005   BUN 18 07/04/2020 1005   BUN 13 08/02/2018 1208   CREATININE 1.38 (H) 07/04/2020 1005      Component Value Date/Time   CALCIUM 10.0 07/04/2020 1005   ALKPHOS 86 05/10/2017 0832   AST 35 07/04/2020 1005   ALT 36 07/04/2020 1005   BILITOT 0.9 07/04/2020 1005      Lab Results  Component Value Date   TSH 1.867 07/30/2018   Lab Results  Component Value Date   WBC 7.7  07/04/2020   HGB 14.9 07/04/2020   HCT 45.6 07/04/2020   MCV 86.4 07/04/2020   PLT 317 07/04/2020    Lab Results  Component Value Date   HGBA1C 7.6 (A) 06/17/2020   Lab Results  Component Value Date   CHOL 85 07/04/2020   HDL 44 07/04/2020   LDLCALC 22 07/04/2020   TRIG 109 07/04/2020   CHOLHDL 1.9 07/04/2020     Total time spent on today's visit was  60 minutes, including both face-to-face time and nonface-to-face time.  Time included that spent on review of records (prior notes available to me/labs/imaging if pertinent), discussing treatment and goals, answering patient's questions and coordinating care.  Cc:  Donita Brooks, MD

## 2020-08-19 ENCOUNTER — Other Ambulatory Visit: Payer: Self-pay

## 2020-08-19 ENCOUNTER — Encounter: Payer: Self-pay | Admitting: Neurology

## 2020-08-19 ENCOUNTER — Ambulatory Visit: Payer: Medicare PPO | Admitting: Neurology

## 2020-08-19 VITALS — BP 145/76 | HR 83 | Ht 69.0 in | Wt 277.0 lb

## 2020-08-19 DIAGNOSIS — G250 Essential tremor: Secondary | ICD-10-CM

## 2020-08-19 NOTE — Patient Instructions (Signed)
Let me know if new neurologic symptoms arise before next visit.  Ask Dr. Duke Salvia if Marden Noble will be a lifetime medication for you.  The physicians and staff at [facility] are committed to providing excellent care. You may receive a survey requesting feedback about your experience at our office. We strive to receive "very good" responses to the survey questions. If you feel that your experience would prevent you from giving the office a "very good " response, please contact our office to try to remedy the situation. We may be reached at [phone]. Thank you for taking the time out of your busy day to complete the survey.

## 2020-08-23 ENCOUNTER — Ambulatory Visit: Payer: Medicare PPO | Admitting: Endocrinology

## 2020-08-27 ENCOUNTER — Other Ambulatory Visit: Payer: Medicare PPO

## 2020-09-02 ENCOUNTER — Other Ambulatory Visit: Payer: Self-pay | Admitting: Cardiology

## 2020-09-02 ENCOUNTER — Other Ambulatory Visit: Payer: Self-pay | Admitting: Family Medicine

## 2020-09-04 ENCOUNTER — Other Ambulatory Visit: Payer: Self-pay | Admitting: Cardiology

## 2020-09-05 ENCOUNTER — Other Ambulatory Visit: Payer: Self-pay

## 2020-09-05 MED ORDER — VICTOZA 18 MG/3ML ~~LOC~~ SOPN: 1.8000 mg | PEN_INJECTOR | Freq: Every day | SUBCUTANEOUS | 3 refills | Status: DC

## 2020-09-10 ENCOUNTER — Other Ambulatory Visit: Payer: Self-pay | Admitting: Cardiology

## 2020-09-23 ENCOUNTER — Encounter (INDEPENDENT_AMBULATORY_CARE_PROVIDER_SITE_OTHER): Payer: Medicare PPO | Admitting: Ophthalmology

## 2020-09-23 ENCOUNTER — Other Ambulatory Visit: Payer: Self-pay

## 2020-09-23 DIAGNOSIS — E11311 Type 2 diabetes mellitus with unspecified diabetic retinopathy with macular edema: Secondary | ICD-10-CM

## 2020-09-23 DIAGNOSIS — I10 Essential (primary) hypertension: Secondary | ICD-10-CM | POA: Diagnosis not present

## 2020-09-23 DIAGNOSIS — E113313 Type 2 diabetes mellitus with moderate nonproliferative diabetic retinopathy with macular edema, bilateral: Secondary | ICD-10-CM | POA: Diagnosis not present

## 2020-09-23 DIAGNOSIS — H35033 Hypertensive retinopathy, bilateral: Secondary | ICD-10-CM | POA: Diagnosis not present

## 2020-09-23 DIAGNOSIS — H43813 Vitreous degeneration, bilateral: Secondary | ICD-10-CM

## 2020-10-19 ENCOUNTER — Other Ambulatory Visit: Payer: Self-pay | Admitting: Family Medicine

## 2020-11-30 ENCOUNTER — Other Ambulatory Visit: Payer: Self-pay | Admitting: Endocrinology

## 2020-11-30 ENCOUNTER — Other Ambulatory Visit: Payer: Self-pay | Admitting: Family Medicine

## 2020-11-30 NOTE — Telephone Encounter (Signed)
1.  Please schedule f/u appt 2.  Then please refill x 1 month, pending that appt.

## 2020-12-09 ENCOUNTER — Encounter (INDEPENDENT_AMBULATORY_CARE_PROVIDER_SITE_OTHER): Payer: Medicare PPO | Admitting: Ophthalmology

## 2020-12-12 ENCOUNTER — Encounter (INDEPENDENT_AMBULATORY_CARE_PROVIDER_SITE_OTHER): Payer: Medicare PPO | Admitting: Ophthalmology

## 2020-12-12 ENCOUNTER — Other Ambulatory Visit: Payer: Self-pay

## 2020-12-12 DIAGNOSIS — H35033 Hypertensive retinopathy, bilateral: Secondary | ICD-10-CM

## 2020-12-12 DIAGNOSIS — I10 Essential (primary) hypertension: Secondary | ICD-10-CM

## 2020-12-12 DIAGNOSIS — H43813 Vitreous degeneration, bilateral: Secondary | ICD-10-CM

## 2020-12-12 DIAGNOSIS — E113313 Type 2 diabetes mellitus with moderate nonproliferative diabetic retinopathy with macular edema, bilateral: Secondary | ICD-10-CM | POA: Diagnosis not present

## 2020-12-13 ENCOUNTER — Encounter (INDEPENDENT_AMBULATORY_CARE_PROVIDER_SITE_OTHER): Payer: Medicare PPO | Admitting: Ophthalmology

## 2020-12-24 ENCOUNTER — Ambulatory Visit: Payer: Medicare PPO | Admitting: Endocrinology

## 2021-01-28 ENCOUNTER — Other Ambulatory Visit: Payer: Self-pay

## 2021-01-28 ENCOUNTER — Ambulatory Visit: Payer: Medicare PPO | Admitting: Endocrinology

## 2021-01-28 VITALS — BP 140/64 | HR 73 | Ht 67.0 in | Wt 265.6 lb

## 2021-01-28 DIAGNOSIS — E119 Type 2 diabetes mellitus without complications: Secondary | ICD-10-CM

## 2021-01-28 DIAGNOSIS — N1831 Chronic kidney disease, stage 3a: Secondary | ICD-10-CM | POA: Diagnosis not present

## 2021-01-28 DIAGNOSIS — Z794 Long term (current) use of insulin: Secondary | ICD-10-CM | POA: Diagnosis not present

## 2021-01-28 DIAGNOSIS — E1122 Type 2 diabetes mellitus with diabetic chronic kidney disease: Secondary | ICD-10-CM | POA: Diagnosis not present

## 2021-01-28 LAB — POCT GLYCOSYLATED HEMOGLOBIN (HGB A1C): Hemoglobin A1C: 9.7 % — AB (ref 4.0–5.6)

## 2021-01-28 MED ORDER — INSULIN ASPART FLEXPEN 100 UNIT/ML ~~LOC~~ SOPN: 25.0000 [IU] | PEN_INJECTOR | Freq: Two times a day (BID) | SUBCUTANEOUS | 3 refills | Status: DC

## 2021-01-28 NOTE — Patient Instructions (Addendum)
check your blood sugar twice a day.  vary the time of day when you check, between before the 3 meals, and at bedtime.  also check if you have symptoms of your blood sugar being too high or too low.  please keep a record of the readings and bring it to your next appointment here (or you can bring the meter itself).  You can write it on any piece of paper.  please call us sooner if your blood sugar goes below 70, or if you have a lot of readings over 200. We will need to take this complex situation in stages.   For now, please:  Increase the humalog to Aspart, 25 units twice a day (breakfast and supper) and:  Please continue the same other diabetes medications for now.   Please come back for a follow-up appointment in 2 months.

## 2021-01-28 NOTE — Progress Notes (Signed)
Subjective:    Patient ID: Erik Hancock, male    DOB: 12-06-1950, 70 y.o.   MRN: [id number]  HPI Pt returns for f/u of diabetes mellitus:  DM type: Insulin-requiring type 2 Dx'ed: 2002 Complications: PN, DR, CAD, and CRI Therapy: insulin since 2018, Victoza, and Jardiance.   DKA: never Severe hypoglycemia: never Pancreatitis: never Pancreatic imaging: normal on 2017 CT Other: he takes multiple daily injections Interval history: no cbg record, but states cbg varies from 126-200.  There is no trend throughout the day.  pt states he feels well in general.  Pt says he never misses the insulin.   Past Medical History:  Diagnosis Date  . Arthritis    "all over" (07/29/2018)  . CAD S/P percutaneous coronary angioplasty    a. stent to mLAD 11/2001. b. DES to PDA/mLCx 2007. c. DES to ostial LAD 07/2018.  [name] Chronic lower back pain   . CKD (chronic kidney disease), stage III   . CKD (chronic kidney disease), stage III   . Diabetic retinopathy (HCC)   . Diabetic retinopathy (HCC)   . GERD (gastroesophageal reflux disease)   . High cholesterol   . History of kidney stones   . Hypertension   . Mobitz type 2 second degree atrioventricular block    a. noted during 07/2018 adm, metoprolol discontinued.  . Morbid obesity (HCC) 07/28/2018  . Prostatitis   . Pulmonary nodule 2013   CT  . Sleep apnea    stopbang=5  . Suspected sleep apnea   . Type II diabetes mellitus (HCC)     Past Surgical History:  Procedure Laterality Date  . CARDIAC CATHETERIZATION  JUNE 2003   PATENT LAD STENT/ BODERLINE OBSTRUCTIVE DISEASE POSTERIOR DESCENDING ARTERY/ NORMAL LVF  . CATARACT EXTRACTION W/ INTRAOCULAR LENS  IMPLANT, BILATERAL Bilateral 2017  . CERVICAL SCOVILLE FORAMINOTOMY W/ EXCISION OF HERNIATED NUCLEC PULPOSUS  2009   C6 - 7  . CORONARY ANGIOPLASTY WITH STENT PLACEMENT  03/30/2006   DR EDMUNDS - 90% LCx@OM2  TAXUS  DES 3.0 X 20 -> 3.25 mm,   95% RPDA -- TAXUS DES 3.0 X 16 --> 3.5 mm  . CORONARY  ANGIOPLASTY WITH STENT PLACEMENT  11/2001   (Dr. Ty Hilts for Dr. Mayford Knife) - mLAD@D2  - TAXUS EXPRESS DES 3.5 x 15   . CORONARY ANGIOPLASTY WITH STENT PLACEMENT  07/29/2018  . CORONARY STENT INTERVENTION N/A 07/29/2018   Procedure: CORONARY STENT INTERVENTION;  Surgeon: Marykay Lex, MD;  Location: [facility] INVASIVE CV LAB;  Service: Cardiovascular;  Laterality: N/A;  . CYSTOSCOPY W/ RETROGRADES  05/04/2012   Procedure: CYSTOSCOPY WITH RETROGRADE PYELOGRAM;  Surgeon: Milford Cage, MD;  Location: [facility] ORS;  Service: Urology;  Laterality: Left;  . CYSTOSCOPY W/ URETERAL STENT PLACEMENT  05/04/2012   Procedure: CYSTOSCOPY WITH STENT REPLACEMENT;  Surgeon: Milford Cage, MD;  Location: [facility];  Service: Urology;  Laterality: Left;  . CYSTOSCOPY WITH STENT PLACEMENT Left 04/04/2012  . CYSTOSCOPY WITH URETEROSCOPY  05/04/2012   Procedure: CYSTOSCOPY WITH URETEROSCOPY;  Surgeon: Milford Cage, MD;  Location: [facility];  Service: Urology;  Laterality: Left;      . LEFT HEART CATH AND CORONARY ANGIOGRAPHY N/A 07/29/2018   Procedure: LEFT HEART CATH AND CORONARY ANGIOGRAPHY;  Surgeon: Marykay Lex, MD;  Location: [facility] INVASIVE CV LAB;  Service: Cardiovascular;  Laterality: N/A;  . LEFT URETEROSCOPIC STONE EXTRACTION  03-14-2003   X2    Social History   Socioeconomic History  . Marital status: Married  Spouse name: Not on file  . Number of children: Not on file  . Years of education: Not on file  . Highest education level: Not on file  Occupational History  . Not on file  Tobacco Use  . Smoking status: Never Smoker  . Smokeless tobacco: Never Used  Vaping Use  . Vaping Use: Never used  Substance and Sexual Activity  . Alcohol use: Never  . Drug use: Never  . Sexual activity: Not Currently  Other Topics Concern  . Not on file  Social History Narrative  . Not on file   Social Determinants of Health   Financial Resource Strain: Not on file  Food Insecurity: Not on file   Transportation Needs: Not on file  Physical Activity: Not on file  Stress: Not on file  Social Connections: Not on file  Intimate Partner Violence: Not on file    Current Outpatient Medications on File Prior to Visit  Medication Sig Dispense Refill  . ACCU-CHEK AVIVA PLUS test strip USE STRIP TO CHECK GLUCOSE TWICE DAILY TO THREE TIMES DAILY **NEED OFFICE VISIT FOR REFILLS** 100 each 0  . aspirin 81 MG tablet Take 81 mg by mouth daily.    [name] atorvastatin (LIPITOR) 80 MG tablet Take 1 tablet by mouth once daily 90 tablet 0  . insulin glargine (LANTUS SOLOSTAR) 100 UNIT/ML Solostar Pen Inject 28 Units into the skin daily. 30 mL 2  . Insulin Pen Needle (BD ULTRA-FINE PEN NEEDLES) 29G X 12.7MM MISC 1 Device by Other route in the morning, at noon, in the evening, and at bedtime. 360 each 3  . JARDIANCE 25 MG TABS tablet Take 1 tablet by mouth once daily 90 tablet 3  . liraglutide (VICTOZA) 18 MG/3ML SOPN Inject 1.8 mg into the skin daily. 18 mL 3  . nitroGLYCERIN (NITROLINGUAL) 0.4 MG/SPRAY spray Place 1 spray under the tongue every 5 (five) minutes x 3 doses as needed for chest pain. 12 g 12  . pantoprazole (PROTONIX) 40 MG tablet Take 1 tablet by mouth once daily 90 tablet 3  . ticagrelor (BRILINTA) 60 MG TABS tablet Take 1 tablet (60 mg total) by mouth 2 (two) times daily. Please keep upcoming appt for refills. Thank you 180 tablet 3   No current facility-administered medications on file prior to visit.    Allergies  Allergen Reactions  . Beta Adrenergic Blockers     Metoprolol stopped 07/2018 due to 2nd degree type 2 AV block.    Family History  Problem Relation Age of Onset  . Hypertension Mother   . Heart attack Father   . Heart disease Father   . Alzheimer's disease Father   . Diabetes Father   . Heart attack Paternal Grandfather   . Heart disease Paternal Grandfather   . Lupus Sister     BP 140/64 (BP Location: Right Arm, Patient Position: Sitting, Cuff Size: Large)    Pulse 73   Ht 5\' 7"  (1.702 m)   Wt 265 lb 9.6 oz (120.5 kg)   SpO2 96%   BMI 41.60 kg/m    Review of Systems He denies hypoglycemia.      Objective:   Physical Exam VITAL SIGNS:  See vs page GENERAL: no distress Pulses: dorsalis pedis intact bilat.   MSK: no deformity of the feet CV: trace bilat leg edema Skin:  no ulcer on the feet.  normal color and temp on the feet.   Neuro: sensation is intact to touch on the feet, but  decreased from normal.   Ext: there is bilateral onychomycosis of the toenails.    Lab Results  Component Value Date   HGBA1C 9.7 (A) 01/28/2021       Assessment & Plan:  Insulin-requiring type 2 DM, with CRI: uncontrolled.     Patient Instructions  check your blood sugar twice a day.  vary the time of day when you check, between before the 3 meals, and at bedtime.  also check if you have symptoms of your blood sugar being too high or too low.  please keep a record of the readings and bring it to your next appointment here (or you can bring the meter itself).  You can write it on any piece of paper.  please call us sooner if your blood sugar goes below 70, or if you have a lot of readings over 200. We will need to take this complex situation in stages.   For now, please:  Increase the humalog to Aspart, 25 units twice a day (breakfast and supper) and:  Please continue the same other diabetes medications for now.   Please come back for a follow-up appointment in 2 months.

## 2021-02-10 ENCOUNTER — Other Ambulatory Visit: Payer: Self-pay | Admitting: Family Medicine

## 2021-02-18 ENCOUNTER — Ambulatory Visit: Payer: Medicare PPO | Admitting: Neurology

## 2021-02-27 ENCOUNTER — Other Ambulatory Visit: Payer: Self-pay

## 2021-02-27 ENCOUNTER — Encounter (INDEPENDENT_AMBULATORY_CARE_PROVIDER_SITE_OTHER): Payer: Medicare PPO | Admitting: Ophthalmology

## 2021-02-27 DIAGNOSIS — H43813 Vitreous degeneration, bilateral: Secondary | ICD-10-CM | POA: Diagnosis not present

## 2021-02-27 DIAGNOSIS — I10 Essential (primary) hypertension: Secondary | ICD-10-CM | POA: Diagnosis not present

## 2021-02-27 DIAGNOSIS — H35033 Hypertensive retinopathy, bilateral: Secondary | ICD-10-CM | POA: Diagnosis not present

## 2021-02-27 DIAGNOSIS — E113313 Type 2 diabetes mellitus with moderate nonproliferative diabetic retinopathy with macular edema, bilateral: Secondary | ICD-10-CM | POA: Diagnosis not present

## 2021-02-28 ENCOUNTER — Encounter (INDEPENDENT_AMBULATORY_CARE_PROVIDER_SITE_OTHER): Payer: Medicare PPO | Admitting: Ophthalmology

## 2021-03-04 NOTE — Progress Notes (Signed)
Assessment/Plan:    1.  ET Plus   -Patient does have a component of resting postural tremor.  Watching for neurodegenerative condition, and he and I discussed that in detail.  He definitely has left upper extremity rest tremor, but does not otherwise meet criteria for Parkinson's.  We will continue to watch him.  -Discussed with patient that if he wanted to try something, his Brilinta would need to be changed to Plavix.  His cardiologist, Dr. Duke Salvia, previously agreed to that.  Ultimately, patient states that he really is not that bothered by tremor and does not want to add more medication right now.  I think that is reasonable.  2.  Diabetic peripheral neuropathy  -likely the source of balance issues.  3.  Abnormal brain scan             -Suspect that the old lacune's in the right cerebellum are completely asymptomatic.  Reviewed MRI of the brain personally.  The lacunar infarctions are not new.  Patient is on 81 mg aspirin and Lipitor, 80 mg daily.  Patient's LDL is quite low.  Last A1c was 7.6.  Ideally, I would like to see that under 7.             -Patient is noting leg pain.  He actually thought it got somewhat better when he went off of the gabapentin but discussed with the patient that lipitor could be contributing.  Given low chol and LDL numbers, wonder if it could be reduced.  Will leave to PCP/cardiologist as pt with multiple stents.  Subjective:   Erik Hancock was seen today in follow up for tremor.  My previous records were reviewed prior to todays visit.  Patient is with his wife who supplements the history.  When I saw the patient last visit, he had a combination of rest and postural tremor along with head tremor.  After last visit, he spoke to his cardiologist and she said that she could change his Brilinta to Plavix so that we could utilize primidone, if the patient decided to utilize medication for tremor (at the conclusion of the last visit he decided to take a  wait-and-see approach).  He has not yet followed up with cardiologist.  Last visit, we also discussed his leg pain.  We talked about the fact that Lipitor could be contributing.  He was supposed to follow-up with his cardiologist.  I do not see he has done that yet.  I did have Dr. George Hugh records and reviewed that.   PREVIOUS MEDICATIONS: Unable to do beta-blocker (stopped in the hospital previously because of bradycardia); not a candidate for topiramate because of history of kidney stones  ALLERGIES:   Allergies  Allergen Reactions  . Beta Adrenergic Blockers     Metoprolol stopped 07/2018 due to 2nd degree type 2 AV block.    CURRENT MEDICATIONS:  Outpatient Encounter Medications as of 03/06/2021  Medication Sig  . ACCU-CHEK AVIVA PLUS test strip USE STRIP TO CHECK GLUCOSE TWICE DAILY TO THREE TIMES DAILY **NEED OFFICE VISIT FOR REFILLS**  . aspirin 81 MG tablet Take 81 mg by mouth daily.  [name] atorvastatin (LIPITOR) 80 MG tablet Take 1 tablet by mouth once daily  . Insulin Aspart FlexPen 100 UNIT/ML SOPN Inject 25 Units into the skin 2 (two) times daily with a meal.  . insulin glargine (LANTUS SOLOSTAR) 100 UNIT/ML Solostar Pen Inject 28 Units into the skin daily.  . Insulin Pen Needle (BD ULTRA-FINE PEN  NEEDLES) 29G X 12.7MM MISC 1 Device by Other route in the morning, at noon, in the evening, and at bedtime.  [name] JARDIANCE 25 MG TABS tablet Take 1 tablet by mouth once daily  . liraglutide (VICTOZA) 18 MG/3ML SOPN Inject 1.8 mg into the skin daily.  . nitroGLYCERIN (NITROLINGUAL) 0.4 MG/SPRAY spray Place 1 spray under the tongue every 5 (five) minutes x 3 doses as needed for chest pain.  . pantoprazole (PROTONIX) 40 MG tablet Take 1 tablet by mouth once daily  . ticagrelor (BRILINTA) 60 MG TABS tablet Take 1 tablet (60 mg total) by mouth 2 (two) times daily. Please keep upcoming appt for refills. Thank you   No facility-administered encounter medications on file as of 03/06/2021.      Objective:    PHYSICAL EXAMINATION:    VITALS:   Vitals:   03/06/21 1032  BP: (!) 146/64  Pulse: 64  SpO2: 97%  Weight: 267 lb (121.1 kg)  Height: 5\' 7"  (1.702 m)    GEN:  The patient appears stated age and is in NAD. HEENT:  Normocephalic, atraumatic.    Neurological examination:  Orientation: The patient is alert and oriented x3. Cranial nerves: There is good facial symmetry. The speech is fluent and clear. Soft palate rises symmetrically and there is no tongue deviation. Hearing is intact to conversational tone. Sensation: Sensation is intact to light touch throughout Motor: Strength is at least antigravity x4.  Movement examination: Tone: There is normal tone in the bilateral upper extremities.  The tone in the lower extremities is normal.  Abnormal movements: there is LUE rest tremor and with distraction there is RLE rest tremor.  There is postural tremor on the L>R.  This is same as last visit. Coordination:  There is no decremation with RAM's,  Gait and Station: The patient has no difficulty arising out of a deep-seated chair without the use of the hands. The patient's stride length is good with good arm swing bilaterally. I have reviewed and interpreted the following labs independently   Chemistry      Component Value Date/Time   NA 139 07/04/2020 1005   NA 140 08/02/2018 1208   K 4.6 07/04/2020 1005   CL 108 07/04/2020 1005   CO2 22 07/04/2020 1005   BUN 18 07/04/2020 1005   BUN 13 08/02/2018 1208   CREATININE 1.38 (H) 07/04/2020 1005      Component Value Date/Time   CALCIUM 10.0 07/04/2020 1005   ALKPHOS 86 05/10/2017 0832   AST 35 07/04/2020 1005   ALT 36 07/04/2020 1005   BILITOT 0.9 07/04/2020 1005      Lab Results  Component Value Date   WBC 7.7 07/04/2020   HGB 14.9 07/04/2020   HCT 45.6 07/04/2020   MCV 86.4 07/04/2020   PLT 317 07/04/2020   Lab Results  Component Value Date   TSH 1.867 07/30/2018     Chemistry      Component  Value Date/Time   NA 139 07/04/2020 1005   NA 140 08/02/2018 1208   K 4.6 07/04/2020 1005   CL 108 07/04/2020 1005   CO2 22 07/04/2020 1005   BUN 18 07/04/2020 1005   BUN 13 08/02/2018 1208   CREATININE 1.38 (H) 07/04/2020 1005      Component Value Date/Time   CALCIUM 10.0 07/04/2020 1005   ALKPHOS 86 05/10/2017 0832   AST 35 07/04/2020 1005   ALT 36 07/04/2020 1005   BILITOT 0.9 07/04/2020 1005  Total time spent on today's visit was 20 minutes, including both face-to-face time and nonface-to-face time.  Time included that spent on review of records (prior notes available to me/labs/imaging if pertinent), discussing treatment and goals, answering patient's questions and coordinating care.  Cc:  Donita Brooks, MD

## 2021-03-06 ENCOUNTER — Encounter: Payer: Self-pay | Admitting: Neurology

## 2021-03-06 ENCOUNTER — Ambulatory Visit: Payer: Medicare PPO | Admitting: Neurology

## 2021-03-06 ENCOUNTER — Other Ambulatory Visit: Payer: Self-pay

## 2021-03-06 VITALS — BP 146/64 | HR 64 | Ht 67.0 in | Wt 267.0 lb

## 2021-03-06 DIAGNOSIS — G250 Essential tremor: Secondary | ICD-10-CM | POA: Diagnosis not present

## 2021-03-06 NOTE — Patient Instructions (Signed)
Call me if new or worsening symptoms are developing.  Otherwise I will see you close to a year.  The physicians and staff at [facility] are committed to providing excellent care. You may receive a survey requesting feedback about your experience at our office. We strive to receive "very good" responses to the survey questions. If you feel that your experience would prevent you from giving the office a "very good " response, please contact our office to try to remedy the situation. We may be reached at [phone]. Thank you for taking the time out of your busy day to complete the survey.

## 2021-03-10 ENCOUNTER — Other Ambulatory Visit: Payer: Self-pay | Admitting: Family Medicine

## 2021-03-13 ENCOUNTER — Other Ambulatory Visit: Payer: Self-pay | Admitting: Cardiovascular Disease

## 2021-03-31 ENCOUNTER — Ambulatory Visit: Payer: Medicare PPO | Admitting: Endocrinology

## 2021-05-15 ENCOUNTER — Ambulatory Visit: Payer: Medicare PPO | Admitting: Endocrinology

## 2021-05-15 ENCOUNTER — Other Ambulatory Visit: Payer: Self-pay

## 2021-05-15 ENCOUNTER — Encounter (INDEPENDENT_AMBULATORY_CARE_PROVIDER_SITE_OTHER): Payer: Medicare PPO | Admitting: Ophthalmology

## 2021-05-15 DIAGNOSIS — E113313 Type 2 diabetes mellitus with moderate nonproliferative diabetic retinopathy with macular edema, bilateral: Secondary | ICD-10-CM | POA: Diagnosis not present

## 2021-05-15 DIAGNOSIS — H35033 Hypertensive retinopathy, bilateral: Secondary | ICD-10-CM | POA: Diagnosis not present

## 2021-05-15 DIAGNOSIS — I10 Essential (primary) hypertension: Secondary | ICD-10-CM

## 2021-05-15 DIAGNOSIS — H43813 Vitreous degeneration, bilateral: Secondary | ICD-10-CM | POA: Diagnosis not present

## 2021-05-16 ENCOUNTER — Other Ambulatory Visit: Payer: Self-pay | Admitting: Family Medicine

## 2021-05-20 ENCOUNTER — Ambulatory Visit: Payer: Medicare PPO | Admitting: Endocrinology

## 2021-05-20 ENCOUNTER — Other Ambulatory Visit: Payer: Self-pay

## 2021-05-20 VITALS — BP 130/64 | HR 81 | Ht 68.0 in | Wt 267.4 lb

## 2021-05-20 DIAGNOSIS — Z794 Long term (current) use of insulin: Secondary | ICD-10-CM | POA: Diagnosis not present

## 2021-05-20 DIAGNOSIS — E1122 Type 2 diabetes mellitus with diabetic chronic kidney disease: Secondary | ICD-10-CM | POA: Diagnosis not present

## 2021-05-20 DIAGNOSIS — E119 Type 2 diabetes mellitus without complications: Secondary | ICD-10-CM

## 2021-05-20 DIAGNOSIS — N1831 Chronic kidney disease, stage 3a: Secondary | ICD-10-CM | POA: Diagnosis not present

## 2021-05-20 LAB — POCT GLYCOSYLATED HEMOGLOBIN (HGB A1C): Hemoglobin A1C: 8.8 % — AB (ref 4.0–5.6)

## 2021-05-20 MED ORDER — LANTUS SOLOSTAR 100 UNIT/ML ~~LOC~~ SOPN: 25.0000 [IU] | PEN_INJECTOR | Freq: Every day | SUBCUTANEOUS | 2 refills | Status: DC

## 2021-05-20 MED ORDER — INSULIN ASPART FLEXPEN 100 UNIT/ML ~~LOC~~ SOPN: 30.0000 [IU] | PEN_INJECTOR | Freq: Two times a day (BID) | SUBCUTANEOUS | 3 refills | Status: DC

## 2021-05-20 NOTE — Patient Instructions (Addendum)
check your blood sugar twice a day.  vary the time of day when you check, between before the 3 meals, and at bedtime.  also check if you have symptoms of your blood sugar being too high or too low.  please keep a record of the readings and bring it to your next appointment here (or you can bring the meter itself).  You can write it on any piece of paper.  please call us sooner if your blood sugar goes below 70, or if you have a lot of readings over 200.   We will need to take this complex situation in stages.   For now, please:  Increase the Aspart to 30 units twice a day (breakfast and supper) and:  Decrease the Lantus to 25 units at bedtime, and: Please continue the same Jardiance.  Please come back for a follow-up appointment in 3 months.

## 2021-05-20 NOTE — Progress Notes (Signed)
Subjective:    Patient ID: Erik Hancock, male    DOB: 29-Feb-1951, 70 y.o.   MRN: [id number]  HPI Pt returns for f/u of diabetes mellitus:  DM type: Insulin-requiring type 2 Dx'ed: 2002 Complications: PN, DR, CAD, and CRI Therapy: insulin since 2018, Victoza, and Jardiance.   DKA: never Severe hypoglycemia: never Pancreatitis: never Pancreatic imaging: normal on 2017 CT Other: he takes multiple daily injections Interval history: no cbg record, but states cbg varies from 85-200.  It is in general higher as the day goes on. pt states he feels well in general.  Pt says he never misses the insulin.  Past Medical History:  Diagnosis Date   Arthritis    "all over" (07/29/2018)   CAD S/P percutaneous coronary angioplasty    a. stent to mLAD 11/2001. b. DES to PDA/mLCx 2007. c. DES to ostial LAD 07/2018.   Chronic lower back pain    CKD (chronic kidney disease), stage III (HCC)    CKD (chronic kidney disease), stage III (HCC)    Diabetic retinopathy (HCC)    Diabetic retinopathy (HCC)    GERD (gastroesophageal reflux disease)    High cholesterol    History of kidney stones    Hypertension    Mobitz type 2 second degree atrioventricular block    a. noted during 07/2018 adm, metoprolol discontinued.   Morbid obesity (HCC) 07/28/2018   Prostatitis    Pulmonary nodule 2013   CT   Sleep apnea    stopbang=5   Suspected sleep apnea    Type II diabetes mellitus (HCC)     Past Surgical History:  Procedure Laterality Date   CARDIAC CATHETERIZATION  JUNE 2003   PATENT LAD STENT/ BODERLINE OBSTRUCTIVE DISEASE POSTERIOR DESCENDING ARTERY/ NORMAL LVF   CATARACT EXTRACTION W/ INTRAOCULAR LENS  IMPLANT, BILATERAL Bilateral 2017   CERVICAL SCOVILLE FORAMINOTOMY W/ EXCISION OF HERNIATED NUCLEC PULPOSUS  2009   C6 - 7   CORONARY ANGIOPLASTY WITH STENT PLACEMENT  03/30/2006   DR EDMUNDS - 90% LCx@OM2  TAXUS  DES 3.0 X 20 -> 3.25 mm,   95% RPDA -- TAXUS DES 3.0 X 16 --> 3.5 mm   CORONARY  ANGIOPLASTY WITH STENT PLACEMENT  11/2001   (Dr. Ty Hilts for Dr. Mayford Knife) - mLAD@D2  - TAXUS EXPRESS DES 3.5 x 15    CORONARY ANGIOPLASTY WITH STENT PLACEMENT  07/29/2018   CORONARY STENT INTERVENTION N/A 07/29/2018   Procedure: CORONARY STENT INTERVENTION;  Surgeon: Marykay Lex, MD;  Location: [facility];  Service: Cardiovascular;  Laterality: N/A;   CYSTOSCOPY W/ RETROGRADES  05/04/2012   Procedure: CYSTOSCOPY WITH RETROGRADE PYELOGRAM;  Surgeon: Milford Cage, MD;  Location: [facility];  Service: Urology;  Laterality: Left;   CYSTOSCOPY W/ URETERAL STENT PLACEMENT  05/04/2012   Procedure: CYSTOSCOPY WITH STENT REPLACEMENT;  Surgeon: Milford Cage, MD;  Location: [facility];  Service: Urology;  Laterality: Left;   CYSTOSCOPY WITH STENT PLACEMENT Left 04/04/2012   CYSTOSCOPY WITH URETEROSCOPY  05/04/2012   Procedure: CYSTOSCOPY WITH URETEROSCOPY;  Surgeon: Milford Cage, MD;  Location: [facility];  Service: Urology;  Laterality: Left;       LEFT HEART CATH AND CORONARY ANGIOGRAPHY N/A 07/29/2018   Procedure: LEFT HEART CATH AND CORONARY ANGIOGRAPHY;  Surgeon: Marykay Lex, MD;  Location: [facility] INV[facility]IVE CV LAB;  Service: Cardiovascular;  Laterality: N/A;   LEFT URETEROSCOPIC STONE EXTRACTION  03-14-2003   X2    Social History   Socioeconomic History  Marital status: Married    Spouse name: Not on file   Number of children: Not on file   Years of education: Not on file   Highest education level: Not on file  Occupational History   Not on file  Tobacco Use   Smoking status: Never   Smokeless tobacco: Never  Vaping Use   Vaping Use: Never used  Substance and Sexual Activity   Alcohol use: Never   Drug use: Never   Sexual activity: Not Currently  Other Topics Concern   Not on file  Social History Narrative   Right handed    Lives with wife    Social Determinants of Health   Financial Resource Strain: Not on file  Food Insecurity: Not on file   Transportation Needs: Not on file  Physical Activity: Not on file  Stress: Not on file  Social Connections: Not on file  Intimate Partner Violence: Not on file    Current Outpatient Medications on File Prior to Visit  Medication Sig Dispense Refill   ACCU-CHEK AVIVA PLUS test strip USE STRIP TO CHECK GLUCOSE TWICE DAILY TO THREE TIMES DAILY **NEED OFFICE VISIT FOR REFILLS** 100 each 0   aspirin 81 MG tablet Take 81 mg by mouth daily.     atorvastatin (LIPITOR) 80 MG tablet Take 1 tablet by mouth once daily 90 tablet 0   BRILINTA 60 MG TABS tablet TAKE 1 TABLET BY MOUTH TWICE DAILY . APPOINTMENT REQUIRED FOR FUTURE REFILLS -  (DISCONTINUE  90MG ) 180 tablet 0   Insulin Pen Needle (BD ULTRA-FINE PEN NEEDLES) 29G X 12.7MM MISC 1 Device by Other route in the morning, at noon, in the evening, and at bedtime. 360 each 3   JARDIANCE 25 MG TABS tablet Take 1 tablet by mouth once daily 90 tablet 3   liraglutide (VICTOZA) 18 MG/3ML SOPN Inject 1.8 mg into the skin daily. 18 mL 3   nitroGLYCERIN (NITROLINGUAL) 0.4 MG/SPRAY spray Place 1 spray under the tongue every 5 (five) minutes x 3 doses as needed for chest pain. 12 g 12   pantoprazole (PROTONIX) 40 MG tablet Take 1 tablet by mouth once daily 90 tablet 0   No current facility-administered medications on file prior to visit.    Allergies  Allergen Reactions   Beta Adrenergic Blockers     Metoprolol stopped 07/2018 due to 2nd degree type 2 AV block.    Family History  Problem Relation Age of Onset   Hypertension Mother    Heart attack Father    Heart disease Father    Alzheimer's disease Father    Diabetes Father    Heart attack Paternal Grandfather    Heart disease Paternal Grandfather    Lupus Sister     BP 130/64   Pulse 81   Ht 5\' 8"  (1.727 m)   Wt 267 lb 6.4 oz (121.3 kg)   SpO2 96%   BMI 40.66 kg/m    Review of Systems     Objective:   Physical Exam GENERAL: no distress Pulses: dorsalis pedis intact bilat.   MSK:  no deformity of the feet CV: trace bilat leg edema Skin:  no ulcer on the feet.  normal color and temp on the feet.   Neuro: sensation is intact to touch on the feet, but decreased from normal.   Ext: there is bilateral onychomycosis of the toenails.    A1c=8.8%    Assessment & Plan:  Insulin-requiring type 2 DM: uncontrolled.    Patient  Instructions  check your blood sugar twice a day.  vary the time of day when you check, between before the 3 meals, and at bedtime.  also check if you have symptoms of your blood sugar being too high or too low.  please keep a record of the readings and bring it to your next appointment here (or you can bring the meter itself).  You can write it on any piece of paper.  please call us sooner if your blood sugar goes below 70, or if you have a lot of readings over 200.   We will need to take this complex situation in stages.   For now, please:  Increase the Aspart to 30 units twice a day (breakfast and supper) and:  Decrease the Lantus to 25 units at bedtime, and: Please continue the same Jardiance.  Please come back for a follow-up appointment in 3 months.

## 2021-06-05 ENCOUNTER — Ambulatory Visit (HOSPITAL_BASED_OUTPATIENT_CLINIC_OR_DEPARTMENT_OTHER): Payer: Medicare PPO | Admitting: Cardiovascular Disease

## 2021-06-05 NOTE — Progress Notes (Incomplete)
Cardiology Office Note   Date:  06/05/2021   ID:  Erik Hancock, DOB January 16, 1951, MRN [id number]  PCP:  Erik Brooks, MD  Cardiologist:   Erik Hancock   No chief complaint on file.   History of Present Illness: Erik Hancock is a 70 y.o. male with CAD s/p PCI, OSA, diabetes, hypertension and hyperlipidemia here for follow-up.  He initially presented to establish care 07/2018.  At that appointment he reported increasing exertional dyspnea.  Symptoms were progressive over the preceding year.  He had no chest pain but did get discomfort in his left arm.  This was similar to when he previously required coronary stenting.  In 2012 Erik Hancock had angina and underwent PCI of the LAD.  He had no other CAD.   He had an echo 01/2014 that revealed LVEF 60-65% and an moderately dilated RV.  He was referred for left heart catheterization.  At that time he was found to have three-vessel CAD with stents in LAD, left circumflex, and PDA.  He was noted to have a 95% ostial LAD lesion that was successfully treated with scoring balloon angioplasty and implantation of an Orsiro 3.5x13 drug-eluting stent.  Time he has been feeling well.  He is not getting any regular exercise.  Some shortness of breath with exertion but it is much better than before.  He also notes that his diet has been poor his meals.  Limits salt when she cooks at home but they also eat out frequently.  Erik Hancock had a sleep study that was positive.  It was felt that he would benefit more from a BiPAP and is scheduled for BiPAP titration later this month.  At the last appointment amlodipine was increased.  He has been checking his blood pressure at home and it has been controlled.  He has been feeling generally well.  He did not go back for his BiPAP titration study.  He does machine.  He has some difficulty sleeping at night but sleeps well throughout the day.  He denies any chest pain, shortness of breath, lower extremity edema,  orthopnea, or PND.  He is not getting much exercise but does work in his yard and his shop.  He has no exertional symptoms.  His wife joined [employer] so he has been losing weight because of the change in her cooking habits.  Today,  He denies any chest pain, shortness of breath, palpitations, or exertional symptoms. No headaches, lightheadedness, or syncope to report. Also has no lower extremity edema, orthopnea or PND.   Past Medical History:  Diagnosis Date   Arthritis    "all over" (07/29/2018)   CAD S/P percutaneous coronary angioplasty    a. stent to mLAD 11/2001. b. DES to PDA/mLCx 2007. c. DES to ostial LAD 07/2018.   Chronic lower back pain    CKD (chronic kidney disease), stage III (HCC)    CKD (chronic kidney disease), stage III (HCC)    Diabetic retinopathy (HCC)    Diabetic retinopathy (HCC)    GERD (gastroesophageal reflux disease)    High cholesterol    History of kidney stones    Hypertension    Mobitz type 2 second degree atrioventricular block    a. noted during 07/2018 adm, metoprolol discontinued.   Morbid obesity (HCC) 07/28/2018   Prostatitis    Pulmonary nodule 2013   CT   Sleep apnea    stopbang=5   Suspected sleep apnea    Type II  diabetes mellitus (HCC)     Past Surgical History:  Procedure Laterality Date   CARDIAC CATHETERIZATION  JUNE 2003   PATENT LAD STENT/ BODERLINE OBSTRUCTIVE DISEASE POSTERIOR DESCENDING ARTERY/ NORMAL LVF   CATARACT EXTRACTION W/ INTRAOCULAR LENS  IMPLANT, BILATERAL Bilateral 2017   CERVICAL SCOVILLE FORAMINOTOMY W/ EXCISION OF HERNIATED NUCLEC PULPOSUS  2009   C6 - 7   CORONARY ANGIOPLASTY WITH STENT PLACEMENT  03/30/2006   DR EDMUNDS - 90% LCx@OM2  TAXUS  DES 3.0 X 20 -> 3.25 mm,   95% RPDA -- TAXUS DES 3.0 X 16 --> 3.5 mm   CORONARY ANGIOPLASTY WITH STENT PLACEMENT  11/2001   (Dr. Ty Hilts for Dr. Mayford Knife) - mLAD@D2  - TAXUS EXPRESS DES 3.5 x 15    CORONARY ANGIOPLASTY WITH STENT PLACEMENT  07/29/2018   CORONARY STENT  INTERVENTION N/A 07/29/2018   Procedure: CORONARY STENT INTERVENTION;  Surgeon: Erik Lex, MD;  Location: [facility];  Service: Cardiovascular;  Laterality: N/A;   CYSTOSCOPY W/ RETROGRADES  05/04/2012   Procedure: CYSTOSCOPY WITH RETROGRADE PYELOGRAM;  Surgeon: Milford Cage, MD;  Location: [facility];  Service: Urology;  Laterality: Left;   CYSTOSCOPY W/ URETERAL STENT PLACEMENT  05/04/2012   Procedure: CYSTOSCOPY WITH STENT REPLACEMENT;  Surgeon: Milford Cage, MD;  Location: [facility];  Service: Urology;  Laterality: Left;   CYSTOSCOPY WITH STENT PLACEMENT Left 04/04/2012   CYSTOSCOPY WITH URETEROSCOPY  05/04/2012   Procedure: CYSTOSCOPY WITH URETEROSCOPY;  Surgeon: Milford Cage, MD;  Location: [facility];  Service: Urology;  Laterality: Left;       LEFT HEART CATH AND CORONARY ANGIOGRAPHY N/A 07/29/2018   Procedure: LEFT HEART CATH AND CORONARY ANGIOGRAPHY;  Surgeon: Erik Lex, MD;  [facility] IN[facility] CV [facility];  Service: Cardiovascular;  Laterality: N/A;   LEFT URETEROSCOPIC STONE EXTRACTION  03-14-2003   X2     Current Outpatient Medications  Medication Sig Dispense Refill   ACCU-CHEK AVIVA PLUS test strip USE STRIP TO CHECK GLUCOSE TWICE DAILY TO THREE TIMES DAILY **NEED OFFICE VISIT FOR REFILLS** 100 each 0   aspirin 81 MG tablet Take 81 mg by mouth daily.     atorvastatin (LIPITOR) 80 MG tablet Take 1 tablet by mouth once daily 90 tablet 0   BRILINTA 60 MG TABS tablet TAKE 1 TABLET BY MOUTH TWICE DAILY . APPOINTMENT REQUIRED FOR FUTURE REFILLS -  (DISCONTINUE  90MG ) 180 tablet 0   Insulin Aspart FlexPen 100 UNIT/ML SOPN Inject 30 Units into the skin 2 (two) times daily with a meal. 60 mL 3   insulin glargine (LANTUS SOLOSTAR) 100 UNIT/ML Solostar Pen Inject 25 Units into the skin at bedtime. 30 mL 2   Insulin Pen Needle (BD ULTRA-FINE PEN NEEDLES) 29G X 12.7MM MISC 1 Device by Other route in the morning, at noon, in the evening, and at bedtime. 360  each 3   JARDIANCE 25 MG TABS tablet Take 1 tablet by mouth once daily 90 tablet 3   liraglutide (VICTOZA) 18 MG/3ML SOPN Inject 1.8 mg into the skin daily. 18 mL 3   nitroGLYCERIN (NITROLINGUAL) 0.4 MG/SPRAY spray Place 1 spray under the tongue every 5 (five) minutes x 3 doses as needed for chest pain. 12 g 12   pantoprazole (PROTONIX) 40 MG tablet Take 1 tablet by mouth once daily 90 tablet 0   No current facility-administered medications for this visit.    Allergies:   Beta adrenergic blockers    Social History:  The patient  reports that he has never smoked. He has never used smokeless tobacco. He reports that he does not drink alcohol and does not use drugs.   Family History:  The patient's family history includes Alzheimer's disease in his father; Diabetes in his father; Heart attack in his father and paternal grandfather; Heart disease in his father and paternal grandfather; Hypertension in his mother; Lupus in his sister.    ROS:  Please see the history of present illness. (+) All other systems are reviewed and negative.    PHYSICAL EXAM: VS:  There were no vitals taken for this visit. , BMI There is no height or weight on file to calculate BMI. GENERAL:  Well appearing HEENT: Pupils equal round and reactive, fundi not visualized, oral mucosa unremarkable NECK:  No jugular venous distention, waveform within normal limits, carotid upstroke brisk and symmetric, no bruits LUNGS:  Clear to auscultation bilaterally HEART:  RRR.  PMI not displaced or sustained,S1 and S2 within normal limits, no S3, no S4, no clicks, no rubs, no murmurs ABD:  Flat, positive bowel sounds normal in frequency in pitch, no bruits, no rebound, no guarding, no midline pulsatile mass, no hepatomegaly, no splenomegaly EXT:  2 plus pulses throughout, no edema, no cyanosis no clubbing SKIN:  No rashes no nodules NEURO:  Cranial nerves II through XII grossly intact, motor grossly intact throughout PSYCH:   Cognitively intact, oriented to person place and time   EKG:  06/05/2021: *** 01/18/2019: EKG was not ordered. 07/19/18: sinus rhythm at rate 60 bpm.  Prior inferior infarct.  Poor R wave progression.  LHC 04/08/11: PROCEDURES PERFORMED: 1. Percutaneous coronary intervention/stent implantation, mid LAD. 2. Percutaneous closure, right femoral artery/Perclose.   INDICATION: The patient is a 70 year old man with diabetes, hypertension, and hypercholesterolemia, who has developed exertional angina. Dr. Mayford Knife has completed coronary angiography revealing a mid LAD stenosis of 80-90% involving a small to moderate sized diagonal branch. He is to undergo percutaneous intervention at this time for definitive revascularization.   LHC 07/29/18: Ost LAD lesion is 85% stenosed -just prior to previous stent. Previously placed prox LAD DES stent is 5% stenosed. Following scoring balloon angioplasty, A drug-eluting stent was successfully placed overlapping the previous stent proximally, using a STENT ORSIRO 3.5X13 --postdilated to 4.0 mm Post intervention, there is a 0% residual stenosis. Previously placed Prox Cx to Dist Cx stent (DES) is widely patent. Ost 2nd Mrg lesion is 40% stenosed. Ost Cx to Prox Cx lesion is 30% stenosed. Previously placed RPDA-1 stent (DES) is widely patent. RPDA-2 lesion is 40% stenosed just beyond stent. The left ventricular systolic function is normal. The left ventricular ejection fraction is 55-65% by visual estimate. LV end diastolic pressure is normal. Post intervention, there is a 5% residual stenosis. A drug-eluting stent was successfully placed using a STENT ORSIRO 3.5X13.   Three-vessel CAD with stents in the LAD, circumflex and PDA. Culprit lesion is severe 95% proximal edge lesion and ostial LAD -> successfully treated with scoring balloon angioplasty and DES stent overlapping the original stent (STENT ORSIRO 3.5X13 -postdilated to 4.0 mm) Patent stents in circumflex  and RPDA Normal LV function with normal LVEDP.  Recent Labs: 07/04/2020: ALT 36; BUN 18; Creat 1.38; Hemoglobin 14.9; Platelets 317; Potassium 4.6; Sodium 139    Lipid Panel    Component Value Date/Time   CHOL 85 07/04/2020 1005   TRIG 109 07/04/2020 1005   HDL 44 07/04/2020 1005   CHOLHDL 1.9 07/04/2020 1005   VLDL 29 05/10/2017  [id number]   LDLCALC 22 07/04/2020 1005      Wt Readings from Last 3 Encounters:  05/20/21 267 lb 6.4 oz (121.3 kg)  03/06/21 267 lb (121.1 kg)  01/28/21 265 lb 9.6 oz (120.5 kg)      ASSESSMENT AND PLAN: No problem-specific Assessment & Plan notes found for this encounter.  # Exertional dyspnea:  # CAD: # s/p LAD PCI: Mr. Raechel AcheHackett is doing better post PCI.  We discussed the importance of working on his diet and exercise.  Continue aspirin, atorvastatin, and ticagrelor.  Beta-blocker was stopped in the hospital due to bradycardia.  # OSA:  Diagnosed on sleep study.  He did not go back for the Bipap titration because he doesn't want to buy the machine.    # Hypertension: BP controlled.  Continue amlodipine and lisinopril.  # Hyperlipidemia: LDL 31 on 10/2018.  Continue atorvastatin.  # DM:  Continue Jardiance. .  Current medicines are reviewed at length with the patient today.  The patient does not have concerns regarding medicines.  The following changes have been made:  no change  Labs/ tests ordered today include:  No orders of the defined types were placed in this encounter.    Disposition:   FU with Tiffany C. Duke Salviaandolph, MD, [facility] in 2 months. ***   I,Mathew Stumpf,acting as a Neurosurgeonscribe for Chilton Siiffany [place], MD.,have documented all relevant documentation on the behalf of Chilton Siiffany [place], MD,as directed by  Chilton Siiffany [place], MD while in the presence of Chilton Siiffany [place], MD.  ***  Signed, Tiffany C. Duke Salviaandolph, MD, [facility]  06/05/2021 7:55 AM     [facility]

## 2021-06-20 ENCOUNTER — Ambulatory Visit: Payer: Medicare PPO | Admitting: Family Medicine

## 2021-06-26 ENCOUNTER — Other Ambulatory Visit: Payer: Self-pay

## 2021-06-26 ENCOUNTER — Encounter: Payer: Self-pay | Admitting: Family Medicine

## 2021-06-26 ENCOUNTER — Ambulatory Visit: Payer: Medicare PPO | Admitting: Family Medicine

## 2021-06-26 VITALS — BP 144/66 | HR 78 | Temp 98.2°F | Resp 16 | Ht 68.0 in | Wt 268.0 lb

## 2021-06-26 DIAGNOSIS — Z9861 Coronary angioplasty status: Secondary | ICD-10-CM

## 2021-06-26 DIAGNOSIS — I2510 Atherosclerotic heart disease of native coronary artery without angina pectoris: Secondary | ICD-10-CM

## 2021-06-26 DIAGNOSIS — Z125 Encounter for screening for malignant neoplasm of prostate: Secondary | ICD-10-CM

## 2021-06-26 DIAGNOSIS — E118 Type 2 diabetes mellitus with unspecified complications: Secondary | ICD-10-CM | POA: Diagnosis not present

## 2021-06-26 DIAGNOSIS — I10 Essential (primary) hypertension: Secondary | ICD-10-CM | POA: Diagnosis not present

## 2021-06-26 MED ORDER — ROSUVASTATIN CALCIUM 10 MG PO TABS: 10.0000 mg | ORAL_TABLET | Freq: Every day | ORAL | 3 refills | Status: DC

## 2021-06-26 NOTE — Progress Notes (Signed)
Subjective:    Patient ID: Erik Hancock, male    DOB: [birth year], 70 y.o.   MRN: [id number] Patient has a history of coronary artery disease as well as insulin-dependent diabetes mellitus.  He is currently seeing an endocrinologist.  Unfortunately his last A1c in June was 8.8.  He has not followed back up with his endocrinologist.  He is taking Lantus 25 units daily and Humalog twice daily.  He states that his fasting blood sugars are well above 150.  He denies any hypoglycemic episodes.  I recommended that he notify his endocrinologist of these values at the back and make further adjustments in his insulin.  He also reports dyspnea with exertion.  He states that he gets easily winded if he walks more than 100 feet.  He also reports pain in his back and his knees.  He was having aching throbbing pain in his legs at night so he discontinued Lipitor and aching throbbing pain in his muscles at night went away.  However he is no longer taking a statin despite his history of strokes as well as coronary artery disease and diabetes.  His blood pressure today is marginally elevated.  He is not checking that at home. Past Medical History:  Diagnosis Date   Arthritis    "all over" (07/29/2018)   CAD S/P percutaneous coronary angioplasty    a. stent to mLAD 11/2001. b. DES to PDA/mLCx 2007. c. DES to ostial LAD 07/2018.   Chronic lower back pain    CKD (chronic kidney disease), stage III (HCC)    CKD (chronic kidney disease), stage III (HCC)    Diabetic retinopathy (HCC)    Diabetic retinopathy (HCC)    GERD (gastroesophageal reflux disease)    High cholesterol    History of kidney stones    Hypertension    Mobitz type 2 second degree atrioventricular block    a. noted during 07/2018 adm, metoprolol discontinued.   Morbid obesity (HCC) 07/28/2018   Prostatitis    Pulmonary nodule 2013   CT   Sleep apnea    stopbang=5   Suspected sleep apnea    Type II diabetes mellitus (HCC)    Past Surgical History:   Procedure Laterality Date   CARDIAC CATHETERIZATION  JUNE 2003   PATENT LAD STENT/ BODERLINE OBSTRUCTIVE DISEASE POSTERIOR DESCENDING ARTERY/ NORMAL LVF   CATARACT EXTRACTION W/ INTRAOCULAR LENS  IMPLANT, BILATERAL Bilateral 2017   CERVICAL SCOVILLE FORAMINOTOMY W/ EXCISION OF HERNIATED NUCLEC PULPOSUS  2009   C6 - 7   CORONARY ANGIOPLASTY WITH STENT PLACEMENT  03/30/2006   DR EDMUNDS - 90% LCx@OM2  TAXUS  DES 3.0 X 20 -> 3.25 mm,   95% RPDA -- TAXUS DES 3.0 X 16 --> 3.5 mm   CORONARY ANGIOPLASTY WITH STENT PLACEMENT  11/2001   (Dr. Ty Hilts for Dr. Mayford Knife) - mLAD@D2  - TAXUS EXPRESS DES 3.5 x 15    CORONARY ANGIOPLASTY WITH STENT PLACEMENT  07/29/2018   CORONARY STENT INTERVENTION N/A 07/29/2018   Procedure: CORONARY STENT INTERVENTION;  Surgeon: Marykay Lex, MD;  Location: [facility];  Service: Cardiovascular;  Laterality: N/A;   CYSTOSCOPY W/ RETROGRADES  05/04/2012   Procedure: CYSTOSCOPY WITH RETROGRADE PYELOGRAM;  Surgeon: Milford Cage, MD;  Location: [facility];  Service: Urology;  Laterality: Left;   CYSTOSCOPY W/ URETERAL STENT PLACEMENT  05/04/2012   Procedure: CYSTOSCOPY WITH STENT REPLACEMENT;  Surgeon: Milford Cage, MD;  Location: [facility];  Service: Urology;  Laterality: Left;  CYSTOSCOPY WITH STENT PLACEMENT Left 04/04/2012   CYSTOSCOPY WITH URETEROSCOPY  05/04/2012   Procedure: CYSTOSCOPY WITH URETEROSCOPY;  Surgeon: Milford Cage, MD;  Location: [facility];  Service: Urology;  Laterality: Left;       LEFT HEART CATH AND CORONARY ANGIOGRAPHY N/A 07/29/2018   Procedure: LEFT HEART CATH AND CORONARY ANGIOGRAPHY;  Surgeon: Marykay Lex, MD;  Location: [facility];  Service: Cardiovascular;  Laterality: N/A;   LEFT URETEROSCOPIC STONE EXTRACTION  03-14-2003   X2   Current Outpatient Medications on File Prior to Visit  Medication Sig Dispense Refill   ACCU-CHEK AVIVA PLUS test strip USE STRIP TO CHECK GLUCOSE TWICE DAILY TO THREE TIMES DAILY  **NEED OFFICE VISIT FOR REFILLS** 100 each 0   aspirin 81 MG tablet Take 81 mg by mouth daily.     BRILINTA 60 MG TABS tablet TAKE 1 TABLET BY MOUTH TWICE DAILY . APPOINTMENT REQUIRED FOR FUTURE REFILLS -  (DISCONTINUE  90MG ) 180 tablet 0   Insulin Aspart FlexPen 100 UNIT/ML SOPN Inject 30 Units into the skin 2 (two) times daily with a meal. 60 mL 3   insulin glargine (LANTUS SOLOSTAR) 100 UNIT/ML Solostar Pen Inject 25 Units into the skin at bedtime. 30 mL 2   Insulin Pen Needle (BD ULTRA-FINE PEN NEEDLES) 29G X 12.7MM MISC 1 Device by Other route in the morning, at noon, in the evening, and at bedtime. 360 each 3   JARDIANCE 25 MG TABS tablet Take 1 tablet by mouth once daily 90 tablet 3   liraglutide (VICTOZA) 18 MG/3ML SOPN Inject 1.8 mg into the skin daily. 18 mL 3   nitroGLYCERIN (NITROLINGUAL) 0.4 MG/SPRAY spray Place 1 spray under the tongue every 5 (five) minutes x 3 doses as needed for chest pain. 12 g 12   pantoprazole (PROTONIX) 40 MG tablet Take 1 tablet by mouth once daily 90 tablet 0   No current facility-administered medications on file prior to visit.   Allergies  Allergen Reactions   Beta Adrenergic Blockers     Metoprolol stopped 07/2018 due to 2nd degree type 2 AV block.   Social History   Socioeconomic History   Marital status: Married    Spouse name: Not on file   Number of children: Not on file   Years of education: Not on file   Highest education level: Not on file  Occupational History   Not on file  Tobacco Use   Smoking status: Never   Smokeless tobacco: Never  Vaping Use   Vaping Use: Never used  Substance and Sexual Activity   Alcohol use: Never   Drug use: Never   Sexual activity: Not Currently  Other Topics Concern   Not on file  Social History Narrative   Right handed    Lives with wife    Social Determinants of Health   Financial Resource Strain: Not on file  Food Insecurity: Not on file  Transportation Needs: Not on file  Physical  Activity: Not on file  Stress: Not on file  Social Connections: Not on file  Intimate Partner Violence: Not on file      Review of Systems  All other systems reviewed and are negative.     Objective:   Physical Exam Vitals reviewed.  Constitutional:      General: He is not in acute distress.    Appearance: He is well-developed. He is obese. He is not ill-appearing, toxic-appearing or diaphoretic.  HENT:     Head:  Normocephalic and atraumatic.     Right Ear: Tympanic membrane and ear canal normal.     Left Ear: Ear canal normal.     Nose: Nose normal. No congestion or rhinorrhea.  Eyes:     General: No scleral icterus.       Right eye: No discharge.        Left eye: No discharge.     Extraocular Movements: Extraocular movements intact.     Conjunctiva/sclera: Conjunctivae normal.     Pupils: Pupils are equal, round, and reactive to light.  Neck:     Thyroid: No thyromegaly.     Vascular: No JVD.  Cardiovascular:     Rate and Rhythm: Normal rate and regular rhythm.     Heart sounds: Normal heart sounds. No murmur heard. Pulmonary:     Effort: Pulmonary effort is normal. No respiratory distress.     Breath sounds: Normal breath sounds. No wheezing or rales.  Chest:     Chest wall: No tenderness.  Abdominal:     General: Bowel sounds are normal. There is no distension.     Palpations: Abdomen is soft. There is no mass.     Tenderness: There is no abdominal tenderness. There is no guarding or rebound.  Musculoskeletal:     Cervical back: Neck supple.  Lymphadenopathy:     Cervical: No cervical adenopathy.  Skin:    Findings: No erythema or rash.  Neurological:     General: No focal deficit present.     Mental Status: He is alert and oriented to person, place, and time. Mental status is at baseline.     Cranial Nerves: No cranial nerve deficit.     Sensory: No sensory deficit.     Motor: No weakness.     Coordination: Coordination abnormal.     Gait: Gait normal.      Deep Tendon Reflexes: Reflexes normal.  Psychiatric:        Mood and Affect: Mood normal.        Behavior: Behavior normal.        Thought Content: Thought content normal.        Judgment: Judgment normal.        Assessment & Plan:  Prostate cancer screening - Plan: PSA  Essential hypertension - Plan: CBC with Differential/Platelet, COMPLETE METABOLIC PANEL WITH GFR, Lipid panel, Microalbumin, urine  CAD S/P percutaneous coronary angioplasty - Plan: CBC with Differential/Platelet, COMPLETE METABOLIC PANEL WITH GFR, Lipid panel, Microalbumin, urine, DG Chest 2 View  Controlled type 2 diabetes mellitus with complication, without long-term current use of insulin (HCC) - Plan: CBC with Differential/Platelet, COMPLETE METABOLIC PANEL WITH GFR, Lipid panel, Microalbumin, urine First, I asked the patient to notify his endocrinologist of his elevated fasting blood sugar so that they can uptitrate his insulin as the endocrinologist deems appropriate.  Second I asked the patient to check his blood pressure every day and if consistently greater than 140 systolic we need to adjust his medications and add additional medication to lower his blood pressure.  Third, I have asked the patient to resume a statin.  I explained to him the necessity of a statin with his medical history and recommended that he try Crestor.  We will try a lower dose to see if he tolerates it better 10 mg a day.  Fourth I will screen for prostate cancer with a PSA.  Meanwhile check CBC CMP lipid panel and microalbumin

## 2021-06-27 ENCOUNTER — Encounter: Payer: Self-pay | Admitting: *Deleted

## 2021-06-27 LAB — COMPLETE METABOLIC PANEL WITH GFR
AG Ratio: 1.6 (calc) (ref 1.0–2.5)
ALT: 20 U/L (ref 9–46)
AST: 23 U/L (ref 10–35)
Albumin: 4.3 g/dL (ref 3.6–5.1)
Alkaline phosphatase (APISO): 124 U/L (ref 35–144)
BUN: 10 mg/dL (ref 7–25)
CO2: 23 mmol/L (ref 20–32)
Calcium: 9.4 mg/dL (ref 8.6–10.3)
Chloride: 109 mmol/L (ref 98–110)
Creat: 1.07 mg/dL (ref 0.70–1.35)
Globulin: 2.7 g/dL (calc) (ref 1.9–3.7)
Glucose, Bld: 47 mg/dL — ABNORMAL LOW (ref 65–99)
Potassium: 3.8 mmol/L (ref 3.5–5.3)
Sodium: 143 mmol/L (ref 135–146)
Total Bilirubin: 0.7 mg/dL (ref 0.2–1.2)
Total Protein: 7 g/dL (ref 6.1–8.1)
eGFR: 75 mL/min/{1.73_m2} (ref >=60)

## 2021-06-27 LAB — CBC WITH DIFFERENTIAL/PLATELET
Absolute Monocytes: 568 cells/uL (ref 200–950)
Basophils Absolute: 50 cells/uL (ref 0–200)
Basophils Relative: 0.7 %
Eosinophils Absolute: 426 cells/uL (ref 15–500)
Eosinophils Relative: 6 %
HCT: 48 % (ref 38.5–50.0)
Hemoglobin: 15 g/dL (ref 13.2–17.1)
Lymphs Abs: 2144 cells/uL (ref 850–3900)
MCH: 25.6 pg — ABNORMAL LOW (ref 27.0–33.0)
MCHC: 31.3 g/dL — ABNORMAL LOW (ref 32.0–36.0)
MCV: 82.1 fL (ref 80.0–100.0)
MPV: 8.7 fL (ref 7.5–12.5)
Monocytes Relative: 8 %
Neutro Abs: 3912 cells/uL (ref 1500–7800)
Neutrophils Relative %: 55.1 %
Platelets: 356 10*3/uL (ref 140–400)
RBC: 5.85 10*6/uL — ABNORMAL HIGH (ref 4.20–5.80)
RDW: 15 % (ref 11.0–15.0)
Total Lymphocyte: 30.2 %
WBC: 7.1 10*3/uL (ref 3.8–10.8)

## 2021-06-27 LAB — LIPID PANEL
Cholesterol: 134 mg/dL (ref <200)
HDL: 47 mg/dL (ref >=40)
LDL Cholesterol (Calc): 71 mg/dL (calc)
Non-HDL Cholesterol (Calc): 87 mg/dL (calc) (ref <130)
Total CHOL/HDL Ratio: 2.9 (calc) (ref <5.0)
Triglycerides: 84 mg/dL (ref <150)

## 2021-06-27 LAB — PSA: PSA: 0.79 ng/mL (ref <=4.00)

## 2021-06-27 LAB — MICROALBUMIN, URINE: Microalb, Ur: 0.8 mg/dL

## 2021-07-03 ENCOUNTER — Other Ambulatory Visit: Payer: Self-pay | Admitting: Cardiovascular Disease

## 2021-07-07 ENCOUNTER — Telehealth: Payer: Self-pay | Admitting: Endocrinology

## 2021-07-07 NOTE — Telephone Encounter (Signed)
Spoke with wife Darl Pikes to let her know to decrease the Lantus at bedtime to 15u per Everardo All.

## 2021-07-07 NOTE — Telephone Encounter (Signed)
Pt's wife states that pt's sugar has been dropping since  being taken humalog twice a day. Pt's wife has stated that pt's sugar is dropping in the morning between 60-80. 07/07/2021 65 after breakfast  07/07/2021 at 10:17am 80  Pt and pt wife would like a call back as soon as possible to know what to do.

## 2021-07-10 ENCOUNTER — Encounter: Payer: Self-pay | Admitting: *Deleted

## 2021-07-10 ENCOUNTER — Encounter (INDEPENDENT_AMBULATORY_CARE_PROVIDER_SITE_OTHER): Payer: Medicare PPO | Admitting: Ophthalmology

## 2021-08-07 ENCOUNTER — Encounter (INDEPENDENT_AMBULATORY_CARE_PROVIDER_SITE_OTHER): Payer: Medicare PPO | Admitting: Ophthalmology

## 2021-08-07 ENCOUNTER — Other Ambulatory Visit: Payer: Self-pay

## 2021-08-07 DIAGNOSIS — E113313 Type 2 diabetes mellitus with moderate nonproliferative diabetic retinopathy with macular edema, bilateral: Secondary | ICD-10-CM

## 2021-08-07 DIAGNOSIS — I10 Essential (primary) hypertension: Secondary | ICD-10-CM | POA: Diagnosis not present

## 2021-08-07 DIAGNOSIS — H43813 Vitreous degeneration, bilateral: Secondary | ICD-10-CM

## 2021-08-07 DIAGNOSIS — H35033 Hypertensive retinopathy, bilateral: Secondary | ICD-10-CM

## 2021-08-09 ENCOUNTER — Ambulatory Visit (INDEPENDENT_AMBULATORY_CARE_PROVIDER_SITE_OTHER): Payer: Medicare PPO | Admitting: Family Medicine

## 2021-08-09 DIAGNOSIS — Z0000 Encounter for general adult medical examination without abnormal findings: Secondary | ICD-10-CM

## 2021-08-09 NOTE — Patient Instructions (Signed)
Health Maintenance, Male Adopting a healthy lifestyle and getting preventive care are important in promoting health and wellness. Ask your health care provider about: The right schedule for you to have regular tests and exams. Things you can do on your own to prevent diseases and keep yourself healthy. What should I know about diet, weight, and exercise? Eat a healthy diet  Eat a diet that includes plenty of vegetables, fruits, low-fat dairy products, and lean protein. Do not eat a lot of foods that are high in solid fats, added sugars, or sodium. Maintain a healthy weight Body mass index (BMI) is a measurement that can be used to identify possible weight problems. It estimates body fat based on height and weight. Your health care provider can help determine your BMI and help you achieve or maintain a healthy weight. Get regular exercise Get regular exercise. This is one of the most important things you can do for your health. Most adults should: Exercise for at least 150 minutes each week. The exercise should increase your heart rate and make you sweat (moderate-intensity exercise). Do strengthening exercises at least twice a week. This is in addition to the moderate-intensity exercise. Spend less time sitting. Even light physical activity can be beneficial. Watch cholesterol and blood lipids Have your blood tested for lipids and cholesterol at 69 years of age, then have this test every 5 years. You may need to have your cholesterol levels checked more often if: Your lipid or cholesterol levels are high. You are older than 69 years of age. You are at high risk for heart disease. What should I know about cancer screening? Many types of cancers can be detected early and may often be prevented. Depending on your health history and family history, you may need to have cancer screening at various ages. This may include screening for: Colorectal cancer. Prostate cancer. Skin cancer. Lung  cancer. What should I know about heart disease, diabetes, and high blood pressure? Blood pressure and heart disease High blood pressure causes heart disease and increases the risk of stroke. This is more likely to develop in people who have high blood pressure readings, are of African descent, or are overweight. Talk with your health care provider about your target blood pressure readings. Have your blood pressure checked: Every 3-5 years if you are 18-39 years of age. Every year if you are 40 years old or older. If you are between the ages of 65 and 75 and are a current or former smoker, ask your health care provider if you should have a one-time screening for abdominal aortic aneurysm (AAA). Diabetes Have regular diabetes screenings. This checks your fasting blood sugar level. Have the screening done: Once every three years after age 45 if you are at a normal weight and have a low risk for diabetes. More often and at a younger age if you are overweight or have a high risk for diabetes. What should I know about preventing infection? Hepatitis B If you have a higher risk for hepatitis B, you should be screened for this virus. Talk with your health care provider to find out if you are at risk for hepatitis B infection. Hepatitis C Blood testing is recommended for: Everyone born from 1945 through 1965. Anyone with known risk factors for hepatitis C. Sexually transmitted infections (STIs) You should be screened each year for STIs, including gonorrhea and chlamydia, if: You are sexually active and are younger than 69 years of age. You are older than 69 years   of age and your health care provider tells you that you are at risk for this type of infection. Your sexual activity has changed since you were last screened, and you are at increased risk for chlamydia or gonorrhea. Ask your health care provider if you are at risk. Ask your health care provider about whether you are at high risk for HIV.  Your health care provider may recommend a prescription medicine to help prevent HIV infection. If you choose to take medicine to prevent HIV, you should first get tested for HIV. You should then be tested every 3 months for as long as you are taking the medicine. Follow these instructions at home: Lifestyle Do not use any products that contain nicotine or tobacco, such as cigarettes, e-cigarettes, and chewing tobacco. If you need help quitting, ask your health care provider. Do not use street drugs. Do not share needles. Ask your health care provider for help if you need support or information about quitting drugs. Alcohol use Do not drink alcohol if your health care provider tells you not to drink. If you drink alcohol: Limit how much you have to 0-2 drinks a day. Be aware of how much alcohol is in your drink. In the [place] one drink equals one 12 oz bottle of beer (355 mL), one 5 oz glass of wine (148 mL), or one 1 oz glass of hard liquor (44 mL). General instructions Schedule regular health, dental, and eye exams. Stay current with your vaccines. Tell your health care provider if: You often feel depressed. You have ever been abused or do not feel safe at home. Summary Adopting a healthy lifestyle and getting preventive care are important in promoting health and wellness. Follow your health care provider's instructions about healthy diet, exercising, and getting tested or screened for diseases. Follow your health care provider's instructions on monitoring your cholesterol and blood pressure. This information is not intended to replace advice given to you by your health care provider. Make sure you discuss any questions you have with your health care provider. Document Revised: 01/17/2021 Document Reviewed: 11/02/2018 [employer] Patient Education  2022 [employer]  

## 2021-08-09 NOTE — Progress Notes (Signed)
Subjective:  I connected with  Erik Hancock on 08/09/21 by telemedicine application and verified that I am speaking with the correct person using two identifiers. Location of patient: Home Location of provider:Office Persons participating in visit:Dillon Kahler and [employer], CMA   I discussed the limitations of evaluation and management by telemedicine. The patient expressed understanding and agreed to proceed.   Erik Hancock is a 70 y.o. male who presents for an Initial Medicare Annual Wellness Visit.  Review of Systems    Defer to PCP       Objective:    There were no vitals filed for this visit. There is no height or weight on file to calculate BMI.  Advanced Directives 08/09/2021 03/06/2021 08/19/2020 07/04/2020 08/22/2018 07/29/2018 07/29/2018  Does Patient Have a Medical Advance Directive? No No No Yes No - No  Would patient like information on creating a medical advance directive? No - Patient declined - - - No - Patient declined Yes (Inpatient - patient defers creating a medical advance directive at this time) No - Patient declined  Pre-existing out of facility DNR order (yellow form or pink MOST form) - - - - - - -    Current Medications (verified) Outpatient Encounter Medications as of 08/09/2021  Medication Sig   ACCU-CHEK AVIVA PLUS test strip USE STRIP TO CHECK GLUCOSE TWICE DAILY TO THREE TIMES DAILY **NEED OFFICE VISIT FOR REFILLS**   aspirin 81 MG tablet Take 81 mg by mouth daily.   BRILINTA 60 MG TABS tablet TAKE 1 TABLET BY MOUTH TWICE DAILY . APPOINTMENT REQUIRED FOR FUTURE REFILLS (DISCONTINUE  90  MG)   Insulin Aspart FlexPen 100 UNIT/ML SOPN Inject 30 Units into the skin 2 (two) times daily with a meal.   insulin glargine (LANTUS SOLOSTAR) 100 UNIT/ML Solostar Pen Inject 25 Units into the skin at bedtime.   Insulin Pen Needle (BD ULTRA-FINE PEN NEEDLES) 29G X 12.7MM MISC 1 Device by Other route in the morning, at noon, in the evening, and at bedtime.    JARDIANCE 25 MG TABS tablet Take 1 tablet by mouth once daily   liraglutide (VICTOZA) 18 MG/3ML SOPN Inject 1.8 mg into the skin daily.   nitroGLYCERIN (NITROLINGUAL) 0.4 MG/SPRAY spray Place 1 spray under the tongue every 5 (five) minutes x 3 doses as needed for chest pain.   pantoprazole (PROTONIX) 40 MG tablet Take 1 tablet by mouth once daily   rosuvastatin (CRESTOR) 10 MG tablet Take 1 tablet (10 mg total) by mouth daily.   No facility-administered encounter medications on file as of 08/09/2021.    Allergies (verified) Beta adrenergic blockers   History: Past Medical History:  Diagnosis Date   Arthritis    "all over" (07/29/2018)   CAD S/P percutaneous coronary angioplasty    a. stent to mLAD 11/2001. b. DES to PDA/mLCx 2007. c. DES to ostial LAD 07/2018.   Chronic lower back pain    CKD (chronic kidney disease), stage III (HCC)    CKD (chronic kidney disease), stage III (HCC)    Diabetic retinopathy (HCC)    Diabetic retinopathy (HCC)    GERD (gastroesophageal reflux disease)    High cholesterol    History of kidney stones    Hypertension    Mobitz type 2 second degree atrioventricular block    a. noted during 07/2018 adm, metoprolol discontinued.   Morbid obesity (HCC) 07/28/2018   Prostatitis    Pulmonary nodule 2013   CT   Sleep apnea  stopbang=5   Suspected sleep apnea    Type II diabetes mellitus (HCC)    Past Surgical History:  Procedure Laterality Date   CARDIAC CATHETERIZATION  JUNE 2003   PATENT LAD STENT/ BODERLINE OBSTRUCTIVE DISEASE POSTERIOR DESCENDING ARTERY/ NORMAL LVF   CATARACT EXTRACTION W/ INTRAOCULAR LENS  IMPLANT, BILATERAL Bilateral 2017   CERVICAL SCOVILLE FORAMINOTOMY W/ EXCISION OF HERNIATED NUCLEC PULPOSUS  2009   C6 - 7   CORONARY ANGIOPLASTY WITH STENT PLACEMENT  03/30/2006   DR EDMUNDS - 90% LCx@OM2  TAXUS  DES 3.0 X 20 -> 3.25 mm,   95% RPDA -- TAXUS DES 3.0 X 16 --> 3.5 mm   CORONARY ANGIOPLASTY WITH STENT PLACEMENT  11/2001   (Dr.  Ty Hilts for Dr. Mayford Knife) - mLAD@D2  - TAXUS EXPRESS DES 3.5 x 15    CORONARY ANGIOPLASTY WITH STENT PLACEMENT  07/29/2018   CORONARY STENT INTERVENTION N/A 07/29/2018   Procedure: CORONARY STENT INTERVENTION;  Surgeon: Marykay Lex, MD;  Location: [facility];  Service: Cardiovascular;  Laterality: N/A;   CYSTOSCOPY W/ RETROGRADES  05/04/2012   Procedure: CYSTOSCOPY WITH RETROGRADE PYELOGRAM;  Surgeon: Milford Cage, MD;  Location: [facility];  Service: Urology;  Laterality: Left;   CYSTOSCOPY W/ URETERAL STENT PLACEMENT  05/04/2012   Procedure: CYSTOSCOPY WITH STENT REPLACEMENT;  Surgeon: Milford Cage, MD;  Location: [facility];  Service: Urology;  Laterality: Left;   CYSTOSCOPY WITH STENT PLACEMENT Left 04/04/2012   CYSTOSCOPY WITH URETEROSCOPY  05/04/2012   Procedure: CYSTOSCOPY WITH URETEROSCOPY;  Surgeon: Milford Cage, MD;  Location: [facility];  Service: Urology;  Laterality: Left;       LEFT HEART CATH AND CORONARY ANGIOGRAPHY N/A 07/29/2018   Procedure: LEFT HEART CATH AND CORONARY ANGIOGRAPHY;  Surgeon: Marykay Lex, MD;  Location: [facility] INVASIVE CV [facility];  Service: Cardiovascular;  Laterality: N/A;   LEFT URETEROSCOPIC STONE EXTRACTION  03-14-2003   X2   Family History  Problem Relation Age of Onset   Hypertension Mother    Heart attack Father    Heart disease Father    Alzheimer's disease Father    Diabetes Father    Heart attack Paternal Grandfather    Heart disease Paternal Grandfather    Lupus Sister    Social History   Socioeconomic History   Marital status: Married    Spouse name: Not on file   Number of children: Not on file   Years of education: Not on file   Highest education level: Not on file  Occupational History   Not on file  Tobacco Use   Smoking status: Never   Smokeless tobacco: Never  Vaping Use   Vaping Use: Never used  Substance and Sexual Activity   Alcohol use: Never   Drug use: Never   Sexual activity: Not Currently   Other Topics Concern   Not on file  Social History Narrative   Right handed    Lives with wife    Social Determinants of Health   Financial Resource Strain: Not on file  Food Insecurity: Not on file  Transportation Needs: Not on file  Physical Activity: Not on file  Stress: Not on file  Social Connections: Not on file    Tobacco Counseling Counseling given: Not Answered   Clinical Intake:  Pre-visit preparation completed: Yes  Pain : No/denies pain     BMI - recorded: 40.75 Nutritional Status: BMI > 30  Obese Nutritional Risks: Failure to thrive Diabetes: Yes CBG done?: Yes CBG  resulted in Enter/ Edit results?: Yes Did pt. bring in CBG monitor from home?: Yes Glucose Meter Downloaded?: No  How often do you need to have someone help you when you read instructions, pamphlets, or other written materials from your doctor or pharmacy?: 2 - Rarely What is the last grade level you completed in school?: 12th  Diabetic?Yes  Interpreter Needed?: No  Information entered by :: Caidynce Muzyka,CMA   Activities of Daily Living In your present state of health, do you have any difficulty performing the following activities: 08/09/2021  Hearing? Y  Comment Would like a hearing test done per wife.  Vision? N  Comment wear glasses at night time per pt.  Difficulty concentrating or making decisions? N  Walking or climbing stairs? N  Dressing or bathing? N  Doing errands, shopping? N  Some recent data might be hidden    Patient Care Team: Donita Brooks, MD as PCP - General (Family Medicine) Chilton Si, MD as PCP - Cardiology (Cardiology) Tat, Octaviano Batty, DO as Consulting Physician (Neurology) Sherrie George, MD as Consulting Physician (Ophthalmology)  Indicate any recent Medical Services you may have received from other than [facility] providers in the past year (date may be approximate).     Assessment:   This is a routine wellness examination for  Edson.  Hearing/Vision screen Hearing Screening - Comments:: Would like to have hearing test done. Vision Screening - Comments:: Does where corrective lens but mainly at night time while driving per patient.  Dietary issues and exercise activities discussed:     Goals Addressed   None   Depression Screen PHQ 2/9 Scores 08/09/2021 06/26/2021 07/04/2020 01/23/2019 01/18/2018 12/10/2017 07/29/2017  PHQ - 2 Score 0 0 0 0 0 0 0    Fall Risk Fall Risk  08/09/2021 06/26/2021 03/06/2021 08/19/2020 07/04/2020  Falls in the past year? 0 0 0 0 0  Number falls in past yr: 0 0 0 0 0  Injury with Fall? 0 0 0 0 0  Risk for fall due to : No Fall Risks No Fall Risks - - -  Follow up Falls evaluation completed;Education provided;Falls prevention discussed Falls evaluation completed - - -    FALL RISK PREVENTION PERTAINING TO THE HOME:  Any stairs in or around the home? No  If so, are there any without handrails? No  Home free of loose throw rugs in walkways, pet beds, electrical cords, etc? Yes  Adequate lighting in your home to reduce risk of falls? Yes   ASSISTIVE DEVICES UTILIZED TO PREVENT FALLS:  Life alert? No  Use of a cane, walker or w/c? No  Grab bars in the bathroom? No  Shower chair or bench in shower? No  Elevated toilet seat or a handicapped toilet? No   TIMED UP AND GO:  Was the test performed? No .  Length of time to ambulate 10 feet: N/A sec.   Gait steady and fast without use of assistive device  Cognitive Function:     6CIT Screen 08/09/2021 07/04/2020  What Year? 0 points 0 points  What month? 0 points 0 points  What time? 0 points 0 points  Count back from 20 0 points 0 points  Months in reverse 0 points 0 points  Repeat phrase 4 points 0 points  Total Score 4 0    Immunizations Immunization History  Administered Date(s) Administered   Fluad Quad(high Dose 65+) 08/08/2019   Hep A / Hep B 01/05/2019, 04/05/2019   Hepatitis A  01/05/2019   Hepatitis B 01/05/2019    Influenza,inj,Quad PF,6+ Mos 09/07/2013, 08/03/2014, 10/22/2015   Influenza,inj,Quad PF,6-35 Mos 07/06/2018   Influenza-Unspecified 08/08/2019   PFIZER(Purple Top)SARS-COV-2 Vaccination 01/15/2020, 02/05/2020   Pneumococcal Conjugate-13 02/11/2017   Pneumococcal Polysaccharide-23 09/07/2013, 07/06/2018   Tdap 02/02/2014, 01/05/2019   Zoster Recombinat (Shingrix) 07/06/2018, 10/31/2018    TDAP status: Up to date  Flu Vaccine status: Up to date Receive from [employer] per patient and wife  Pneumococcal vaccine status: Up to date  Covid-19 vaccine status: Completed vaccines  Qualifies for Shingles Vaccine? Yes   Zostavax completed No   Shingrix Completed?: Yes  Screening Tests Health Maintenance  Topic Date Due   Hepatitis C Screening  Never done   COVID-19 Vaccine (3 - Booster for Pfizer series) 07/07/2020   INFLUENZA VACCINE  06/23/2021   HEMOGLOBIN A1C  11/19/2021   FOOT EXAM  05/20/2022   URINE MICROALBUMIN  06/26/2022   OPHTHALMOLOGY EXAM  08/07/2022   Fecal DNA (Cologuard)  09/13/2022   TETANUS/TDAP  01/05/2029   Zoster Vaccines- Shingrix  Completed   HPV VACCINES  Aged Out    Health Maintenance  Health Maintenance Due  Topic Date Due   Hepatitis C Screening  Never done   COVID-19 Vaccine (3 - Booster for Pfizer series) 07/07/2020   INFLUENZA VACCINE  06/23/2021    Colorectal cancer screening: Type of screening: Cologuard. Completed 09/14/2019. Repeat every 3 years  Lung Cancer Screening: (Low Dose CT Chest recommended if Age 22-80 years, 30 pack-year currently smoking OR have quit w/in 15years.) does not qualify.   Lung Cancer Screening Referral: N/A  Additional Screening:  Hepatitis C Screening: does qualify; Completed No, due for Hep C screening.  Vision Screening: Recommended annual ophthalmology exams for early detection of glaucoma and other disorders of the eye. Is the patient up to date with their annual eye exam?  Yes  per patient and wife but  couldn't provide a date. Who is the provider or what is the name of the office in which the patient attends annual eye exams?  If pt is not established with a provider, would they like to be referred to a provider to establish care? No .   Dental Screening: Recommended annual dental exams for proper oral hygiene  Community Resource Referral / Chronic Care Management: CRR required this visit?  No   CCM required this visit?  No      Plan:     I have personally reviewed and noted the following in the patient's chart:   Medical and social history Use of alcohol, tobacco or illicit drugs  Current medications and supplements including opioid prescriptions. Patient is not currently taking opioid prescriptions. Functional ability and status Nutritional status Physical activity Advanced directives List of other physicians Hospitalizations, surgeries, and ER visits in previous 12 months Vitals Screenings to include cognitive, depression, and falls Referrals and appointments  In addition, I have reviewed and discussed with patient certain preventive protocols, quality metrics, and best practice recommendations. A written personalized care plan for preventive services as well as general preventive health recommendations were provided to patient.     Johney Maine Almadelia Looman, CMA   08/09/2021   Nurse Notes: Non-Face to Face 25 minutes visit.  Mr. Befort , Thank you for taking time to come for your Medicare Wellness Visit. I appreciate your ongoing commitment to your health goals. Please review the following plan we discussed and let me know if I can assist you in the future.  These are the goals we discussed:  Goals   None     This is a list of the screening recommended for you and due dates:  Health Maintenance  Topic Date Due   Hepatitis C Screening: USPSTF Recommendation to screen - Ages 72-79 yo.  Never done   COVID-19 Vaccine (3 - Booster for Pfizer series) 07/07/2020   Flu  Shot  06/23/2021   Hemoglobin A1C  11/19/2021   Complete foot exam   05/20/2022   Urine Protein Check  06/26/2022   Eye exam for diabetics  08/07/2022   Cologuard (Stool DNA test)  09/13/2022   Tetanus Vaccine  01/05/2029   Zoster (Shingles) Vaccine  Completed   HPV Vaccine  Aged Out

## 2021-08-17 ENCOUNTER — Other Ambulatory Visit: Payer: Self-pay | Admitting: Family Medicine

## 2021-08-21 ENCOUNTER — Other Ambulatory Visit: Payer: Self-pay

## 2021-08-21 ENCOUNTER — Ambulatory Visit: Payer: Medicare PPO | Admitting: Endocrinology

## 2021-08-21 VITALS — BP 150/60 | HR 69 | Ht 68.0 in | Wt 268.0 lb

## 2021-08-21 DIAGNOSIS — Z794 Long term (current) use of insulin: Secondary | ICD-10-CM

## 2021-08-21 DIAGNOSIS — N1831 Chronic kidney disease, stage 3a: Secondary | ICD-10-CM | POA: Diagnosis not present

## 2021-08-21 DIAGNOSIS — E1122 Type 2 diabetes mellitus with diabetic chronic kidney disease: Secondary | ICD-10-CM | POA: Diagnosis not present

## 2021-08-21 DIAGNOSIS — E119 Type 2 diabetes mellitus without complications: Secondary | ICD-10-CM

## 2021-08-21 LAB — POCT GLYCOSYLATED HEMOGLOBIN (HGB A1C): Hemoglobin A1C: 8 % — AB (ref 4.0–5.6)

## 2021-08-21 MED ORDER — FREESTYLE LIBRE 2 SENSOR MISC: 1.0000 | 3 refills | Status: DC

## 2021-08-21 MED ORDER — LANTUS SOLOSTAR 100 UNIT/ML ~~LOC~~ SOPN: 15.0000 [IU] | PEN_INJECTOR | Freq: Every day | SUBCUTANEOUS | 2 refills | Status: DC

## 2021-08-21 NOTE — Patient Instructions (Addendum)
check your blood sugar twice a day.  vary the time of day when you check, between before the 3 meals, and at bedtime.  also check if you have symptoms of your blood sugar being too high or too low.  please keep a record of the readings and bring it to your next appointment here (or you can bring the meter itself).  You can write it on any piece of paper.  please call us sooner if your blood sugar goes below 70, or if you have a lot of readings over 200.   We will need to take this complex situation in stages.   Please continue the same insulin and 2 other diabetes medications.  Please ask your insurance if they will pay for a once per week.   I have sent a prescription to your pharmacy, for the continuous glucose monitor sensors.   Please come back for a follow-up appointment in 2 months.

## 2021-08-21 NOTE — Progress Notes (Signed)
Subjective:    Patient ID: Erik Hancock, male    DOB: [birth year], 70 y.o.   MRN: [id number]  HPI Pt returns for f/u of diabetes mellitus:  DM type: Insulin-requiring type 2 Dx'ed: 2002 Complications: PN, DR, CAD, and CRI Therapy: insulin since 2018, Victoza, and Jardiance.   DKA: never Severe hypoglycemia: never Pancreatitis: never Pancreatic imaging: normal on 2017 CT Other: he takes multiple daily injections Interval history: no cbg record, but states cbg varies from 80-230.  It is in general lowest after breakfast, and highest fasting pt states he feels well in general.  Pt says he never misses the insulin. He seldom has hypoglycemia, and these episodes are mild.    Past Medical History:  Diagnosis Date   Arthritis    "all over" (07/29/2018)   CAD S/P percutaneous coronary angioplasty    a. stent to mLAD 11/2001. b. DES to PDA/mLCx 2007. c. DES to ostial LAD 07/2018.   Chronic lower back pain    CKD (chronic kidney disease), stage III (HCC)    CKD (chronic kidney disease), stage III (HCC)    Diabetic retinopathy (HCC)    Diabetic retinopathy (HCC)    GERD (gastroesophageal reflux disease)    High cholesterol    History of kidney stones    Hypertension    Mobitz type 2 second degree atrioventricular block    a. noted during 07/2018 adm, metoprolol discontinued.   Morbid obesity (HCC) 07/28/2018   Prostatitis    Pulmonary nodule 2013   CT   Sleep apnea    stopbang=5   Suspected sleep apnea    Type II diabetes mellitus (HCC)     Past Surgical History:  Procedure Laterality Date   CARDIAC CATHETERIZATION  JUNE 2003   PATENT LAD STENT/ BODERLINE OBSTRUCTIVE DISEASE POSTERIOR DESCENDING ARTERY/ NORMAL LVF   CATARACT EXTRACTION W/ INTRAOCULAR LENS  IMPLANT, BILATERAL Bilateral 2017   CERVICAL SCOVILLE FORAMINOTOMY W/ EXCISION OF HERNIATED NUCLEC PULPOSUS  2009   C6 - 7   CORONARY ANGIOPLASTY WITH STENT PLACEMENT  03/30/2006   DR EDMUNDS - 90% LCx@OM2  TAXUS  DES 3.0 X 20 ->  3.25 mm,   95% RPDA -- TAXUS DES 3.0 X 16 --> 3.5 mm   CORONARY ANGIOPLASTY WITH STENT PLACEMENT  11/2001   (Dr. Ty Hilts for Dr. Mayford Knife) - mLAD@D2  - TAXUS EXPRESS DES 3.5 x 15    CORONARY ANGIOPLASTY WITH STENT PLACEMENT  07/29/2018   CORONARY STENT INTERVENTION N/A 07/29/2018   Procedure: CORONARY STENT INTERVENTION;  Surgeon: Marykay Lex, MD;  Location: [facility];  Service: Cardiovascular;  Laterality: N/A;   CYSTOSCOPY W/ RETROGRADES  05/04/2012   Procedure: CYSTOSCOPY WITH RETROGRADE PYELOGRAM;  Surgeon: Milford Cage, MD;  Location: [facility];  Service: Urology;  Laterality: Left;   CYSTOSCOPY W/ URETERAL STENT PLACEMENT  05/04/2012   Procedure: CYSTOSCOPY WITH STENT REPLACEMENT;  Surgeon: Milford Cage, MD;  Location: [facility];  Service: Urology;  Laterality: Left;   CYSTOSCOPY WITH STENT PLACEMENT Left 04/04/2012   CYSTOSCOPY WITH URETEROSCOPY  05/04/2012   Procedure: CYSTOSCOPY WITH URETEROSCOPY;  Surgeon: Milford Cage, MD;  Location: [facility];  Service: Urology;  Laterality: Left;       LEFT HEART CATH AND CORONARY ANGIOGRAPHY N/A 07/29/2018   Procedure: LEFT HEART CATH AND CORONARY ANGIOGRAPHY;  Surgeon: Marykay Lex, MD;  [facility] INVASIVE CV LAB;  Service: Cardiovascular;  Laterality: N/A;   LEFT URETEROSCOPIC STONE EXTRACTION  03-14-2003   X2  Social History   Socioeconomic History   Marital status: Married    Spouse name: Not on file   Number of children: Not on file   Years of education: Not on file   Highest education level: Not on file  Occupational History   Not on file  Tobacco Use   Smoking status: Never   Smokeless tobacco: Never  Vaping Use   Vaping Use: Never used  Substance and Sexual Activity   Alcohol use: Never   Drug use: Never   Sexual activity: Not Currently  Other Topics Concern   Not on file  Social History Narrative   Right handed    Lives with wife    Social Determinants of Health   Financial Resource  Strain: Not on file  Food Insecurity: Not on file  Transportation Needs: Not on file  Physical Activity: Not on file  Stress: Not on file  Social Connections: Not on file  Intimate Partner Violence: Not on file    Current Outpatient Medications on File Prior to Visit  Medication Sig Dispense Refill   ACCU-CHEK AVIVA PLUS test strip USE STRIP TO CHECK GLUCOSE TWICE DAILY TO THREE TIMES DAILY **NEED OFFICE VISIT FOR REFILLS** 100 each 0   aspirin 81 MG tablet Take 81 mg by mouth daily.     BRILINTA 60 MG TABS tablet TAKE 1 TABLET BY MOUTH TWICE DAILY . APPOINTMENT REQUIRED FOR FUTURE REFILLS (DISCONTINUE  90  MG) 180 tablet 0   Insulin Aspart FlexPen 100 UNIT/ML SOPN Inject 30 Units into the skin 2 (two) times daily with a meal. 60 mL 3   Insulin Pen Needle (BD ULTRA-FINE PEN NEEDLES) 29G X 12.7MM MISC 1 Device by Other route in the morning, at noon, in the evening, and at bedtime. 360 each 3   JARDIANCE 25 MG TABS tablet Take 1 tablet by mouth once daily 90 tablet 3   liraglutide (VICTOZA) 18 MG/3ML SOPN Inject 1.8 mg into the skin daily. 18 mL 3   nitroGLYCERIN (NITROLINGUAL) 0.4 MG/SPRAY spray Place 1 spray under the tongue every 5 (five) minutes x 3 doses as needed for chest pain. 12 g 12   pantoprazole (PROTONIX) 40 MG tablet TAKE 1 TABLET BY MOUTH ONCE DAILY **REQUIRES  OFFICE  VISIT  BEFORE  ANY  FURTHER  REFILLS  CAN  BE  GIVEN 90 tablet 0   rosuvastatin (CRESTOR) 10 MG tablet Take 1 tablet (10 mg total) by mouth daily. 90 tablet 3   No current facility-administered medications on file prior to visit.    Allergies  Allergen Reactions   Beta Adrenergic Blockers     Metoprolol stopped 07/2018 due to 2nd degree type 2 AV block.    Family History  Problem Relation Age of Onset   Hypertension Mother    Heart attack Father    Heart disease Father    Alzheimer's disease Father    Diabetes Father    Heart attack Paternal Grandfather    Heart disease Paternal Grandfather    Lupus  Sister     BP (!) 150/60 (BP Location: Right Arm, Patient Position: Sitting, Cuff Size: Large)   Pulse 69   Ht 5\' 8"  (1.727 m)   Wt 268 lb (121.6 kg)   SpO2 96%   BMI 40.75 kg/m   Review of Systems     Objective:   Physical Exam GENERAL: no distress Pulses: dorsalis pedis intact bilat.   MSK: no deformity of the feet CV: trace bilat leg  edema Skin:  no ulcer on the feet.  normal color and temp on the feet.   Neuro: sensation is intact to touch on the feet, but decreased from normal.   Ext: there is bilateral onychomycosis of the toenails.   A1c=8.0%     Assessment & Plan:  Insulin-requiring type 2 DM: uncontrolled.  Hypoglycemia, due to insulin: pt will ask ins about GLP rx.   Patient Instructions  check your blood sugar twice a day.  vary the time of day when you check, between before the 3 meals, and at bedtime.  also check if you have symptoms of your blood sugar being too high or too low.  please keep a record of the readings and bring it to your next appointment here (or you can bring the meter itself).  You can write it on any piece of paper.  please call us sooner if your blood sugar goes below 70, or if you have a lot of readings over 200.   We will need to take this complex situation in stages.   Please continue the same insulin and 2 other diabetes medications.  Please ask your insurance if they will pay for a once per week.   I have sent a prescription to your pharmacy, for the continuous glucose monitor sensors.   Please come back for a follow-up appointment in 2 months.

## 2021-08-25 ENCOUNTER — Telehealth: Payer: Self-pay | Admitting: Endocrinology

## 2021-08-25 NOTE — Telephone Encounter (Signed)
Patient's wife Darl Pikes called re: Darl Pikes states RX's (previously sent to [facility]) for [employer] 2 device, Sensors & supplies requires-per insurance- RX's be sent to [facility] supply Ph# [phone]

## 2021-08-25 NOTE — Telephone Encounter (Signed)
Called and left a detailed message advising Erik Hancock that she(or Erik Hancock)  would need to initiate DME order with supply company first and they will send over required paperwork to be completed by the office. Advised that if order has already been initiated we will be on the look out for any paperwork that needs to be completed.

## 2021-09-01 ENCOUNTER — Ambulatory Visit: Payer: Medicare PPO | Admitting: Family Medicine

## 2021-09-01 ENCOUNTER — Encounter: Payer: Self-pay | Admitting: Family Medicine

## 2021-09-01 ENCOUNTER — Other Ambulatory Visit: Payer: Self-pay

## 2021-09-01 VITALS — BP 142/66 | HR 62 | Temp 99.3°F | Resp 14 | Ht 68.0 in | Wt 272.0 lb

## 2021-09-01 DIAGNOSIS — T148XXA Other injury of unspecified body region, initial encounter: Secondary | ICD-10-CM | POA: Diagnosis not present

## 2021-09-01 DIAGNOSIS — L03011 Cellulitis of right finger: Secondary | ICD-10-CM

## 2021-09-01 MED ORDER — CEFTRIAXONE SODIUM 1 G IJ SOLR
1.0000 g | Freq: Once | INTRAMUSCULAR | Status: AC
Start: 2021-09-01 — End: 2021-09-01
  Administered 2021-09-01: 1 g via INTRAMUSCULAR

## 2021-09-01 MED ORDER — CEPHALEXIN 500 MG PO CAPS: 500.0000 mg | ORAL_CAPSULE | Freq: Four times a day (QID) | ORAL | 0 refills | Status: DC

## 2021-09-01 NOTE — Progress Notes (Signed)
Subjective:    Patient ID: Erik Hancock, male    DOB: 1951-07-30, 70 y.o.   MRN: [id number]  Friday, the patient was working and suffered a puncture wound to his right hand near his right second MCP joint.  The skin around that area is now erythematous and warm.  It hurts for him to make a fist.  There is no erythema radiating down his index finger.  There is no streaking erythema radiating up his arm from his hand.  There is no effusion in the joint.  However there is swelling over the dorsum of the right hand indicating a soft tissue infection/cellulitis.  This is not confined just to the joint. Past Medical History:  Diagnosis Date   Arthritis    "all over" (07/29/2018)   CAD S/P percutaneous coronary angioplasty    a. stent to mLAD 11/2001. b. DES to PDA/mLCx 2007. c. DES to ostial LAD 07/2018.   Chronic lower back pain    CKD (chronic kidney disease), stage III (HCC)    CKD (chronic kidney disease), stage III (HCC)    Diabetic retinopathy (HCC)    Diabetic retinopathy (HCC)    GERD (gastroesophageal reflux disease)    High cholesterol    History of kidney stones    Hypertension    Mobitz type 2 second degree atrioventricular block    a. noted during 07/2018 adm, metoprolol discontinued.   Morbid obesity (HCC) 07/28/2018   Prostatitis    Pulmonary nodule 2013   CT   Sleep apnea    stopbang=5   Suspected sleep apnea    Type II diabetes mellitus (HCC)    Past Surgical History:  Procedure Laterality Date   CARDIAC CATHETERIZATION  JUNE 2003   PATENT LAD STENT/ BODERLINE OBSTRUCTIVE DISEASE POSTERIOR DESCENDING ARTERY/ NORMAL LVF   CATARACT EXTRACTION W/ INTRAOCULAR LENS  IMPLANT, BILATERAL Bilateral 2017   CERVICAL SCOVILLE FORAMINOTOMY W/ EXCISION OF HERNIATED NUCLEC PULPOSUS  2009   C6 - 7   CORONARY ANGIOPLASTY WITH STENT PLACEMENT  03/30/2006   DR EDMUNDS - 90% LCx@OM2  TAXUS  DES 3.0 X 20 -> 3.25 mm,   95% RPDA -- TAXUS DES 3.0 X 16 --> 3.5 mm   CORONARY ANGIOPLASTY WITH  STENT PLACEMENT  11/2001   (Dr. Ty Hilts for Dr. Mayford Knife) - mLAD@D2  - TAXUS EXPRESS DES 3.5 x 15    CORONARY ANGIOPLASTY WITH STENT PLACEMENT  07/29/2018   CORONARY STENT INTERVENTION N/A 07/29/2018   Procedure: CORONARY STENT INTERVENTION;  Surgeon: Marykay Lex, MD;  Location: [facility];  Service: Cardiovascular;  Laterality: N/A;   CYSTOSCOPY W/ RETROGRADES  05/04/2012   Procedure: CYSTOSCOPY WITH RETROGRADE PYELOGRAM;  Surgeon: Milford Cage, MD;  Location: [facility];  Service: Urology;  Laterality: Left;   CYSTOSCOPY W/ URETERAL STENT PLACEMENT  05/04/2012   Procedure: CYSTOSCOPY WITH STENT REPLACEMENT;  Surgeon: Milford Cage, MD;  Location: [facility];  Service: Urology;  Laterality: Left;   CYSTOSCOPY WITH STENT PLACEMENT Left 04/04/2012   CYSTOSCOPY WITH URETEROSCOPY  05/04/2012   Procedure: CYSTOSCOPY WITH URETEROSCOPY;  Surgeon: Milford Cage, MD;  Location: [facility];  Service: Urology;  Laterality: Left;       LEFT HEART CATH AND CORONARY ANGIOGRAPHY N/A 07/29/2018   Procedure: LEFT HEART CATH AND CORONARY ANGIOGRAPHY;  Surgeon: Marykay Lex, MD;  Location: [facility] INVASIVE CV LAB;  Service: Cardiovascular;  Laterality: N/A;   LEFT URETEROSCOPIC STONE EXTRACTION  03-14-2003   X2   Current  Outpatient Medications on File Prior to Visit  Medication Sig Dispense Refill   ACCU-CHEK AVIVA PLUS test strip USE STRIP TO CHECK GLUCOSE TWICE DAILY TO THREE TIMES DAILY **NEED OFFICE VISIT FOR REFILLS** 100 each 0   aspirin 81 MG tablet Take 81 mg by mouth daily.     BRILINTA 60 MG TABS tablet TAKE 1 TABLET BY MOUTH TWICE DAILY . APPOINTMENT REQUIRED FOR FUTURE REFILLS (DISCONTINUE  90  MG) 180 tablet 0   Continuous Blood Gluc Sensor (FREESTYLE LIBRE 2 SENSOR) MISC 1 Device by Does not apply route every 14 (fourteen) days. 6 each 3   Insulin Aspart FlexPen 100 UNIT/ML SOPN Inject 30 Units into the skin 2 (two) times daily with a meal. 60 mL 3   insulin glargine (LANTUS  SOLOSTAR) 100 UNIT/ML Solostar Pen Inject 15 Units into the skin at bedtime. 30 mL 2   Insulin Pen Needle (BD ULTRA-FINE PEN NEEDLES) 29G X 12.7MM MISC 1 Device by Other route in the morning, at noon, in the evening, and at bedtime. 360 each 3   JARDIANCE 25 MG TABS tablet Take 1 tablet by mouth once daily 90 tablet 3   liraglutide (VICTOZA) 18 MG/3ML SOPN Inject 1.8 mg into the skin daily. 18 mL 3   nitroGLYCERIN (NITROLINGUAL) 0.4 MG/SPRAY spray Place 1 spray under the tongue every 5 (five) minutes x 3 doses as needed for chest pain. 12 g 12   pantoprazole (PROTONIX) 40 MG tablet TAKE 1 TABLET BY MOUTH ONCE DAILY **REQUIRES  OFFICE  VISIT  BEFORE  ANY  FURTHER  REFILLS  CAN  BE  GIVEN 90 tablet 0   rosuvastatin (CRESTOR) 10 MG tablet Take 1 tablet (10 mg total) by mouth daily. 90 tablet 3   No current facility-administered medications on file prior to visit.   Allergies  Allergen Reactions   Beta Adrenergic Blockers     Metoprolol stopped 07/2018 due to 2nd degree type 2 AV block.   Social History   Socioeconomic History   Marital status: Married    Spouse name: Not on file   Number of children: Not on file   Years of education: Not on file   Highest education level: Not on file  Occupational History   Not on file  Tobacco Use   Smoking status: Never   Smokeless tobacco: Never  Vaping Use   Vaping Use: Never used  Substance and Sexual Activity   Alcohol use: Never   Drug use: Never   Sexual activity: Not Currently  Other Topics Concern   Not on file  Social History Narrative   Right handed    Lives with wife    Social Determinants of Health   Financial Resource Strain: Not on file  Food Insecurity: Not on file  Transportation Needs: Not on file  Physical Activity: Not on file  Stress: Not on file  Social Connections: Not on file  Intimate Partner Violence: Not on file      Review of Systems  All other systems reviewed and are negative.     Objective:   Regular rate and rhythm no murmurs rubs gallops, lungs are clear to auscultation bilaterally with no wheezes crackles or rails.  The skin over the dorsum of the right hand starting around the second MCP joint and extending onto the dorsum of the hand to the thumb but not extending past the wrist is erythematous warm and indurated.  There is pain with range of motion in the hand.  Patient states that he feels like the joint is tight and swollen.  The finger is neurovascularly intact.  Vital signs are reviewed      Assessment & Plan:   Puncture wound - Plan: cefTRIAXone (ROCEPHIN) injection 1 g  Cellulitis of finger of right hand - Plan: cefTRIAXone (ROCEPHIN) injection 1 g Cellulitis versus septic arthritis.  I believe this is most likely cellulitis based on his exam today.  I will give the patient 1 g of Rocephin IM x1 now and start him on Keflex 500 mg 4 times daily for 10 days.  If the erythema and swelling is worsening he will need to see orthopedics immediately.  However if it is improving gradually over the next 48 hours he would just complete the oral antibiotics at home for a soft tissue infection in the skin.  Tetanus shot was last given in 2020 and is up-to-date

## 2021-09-02 ENCOUNTER — Encounter (HOSPITAL_BASED_OUTPATIENT_CLINIC_OR_DEPARTMENT_OTHER): Payer: Self-pay | Admitting: Cardiovascular Disease

## 2021-09-02 ENCOUNTER — Telehealth (HOSPITAL_BASED_OUTPATIENT_CLINIC_OR_DEPARTMENT_OTHER): Payer: Self-pay | Admitting: *Deleted

## 2021-09-02 ENCOUNTER — Ambulatory Visit (HOSPITAL_BASED_OUTPATIENT_CLINIC_OR_DEPARTMENT_OTHER): Payer: Medicare PPO | Admitting: Cardiovascular Disease

## 2021-09-02 VITALS — BP 132/78 | HR 65 | Ht 68.0 in | Wt 271.6 lb

## 2021-09-02 DIAGNOSIS — I10 Essential (primary) hypertension: Secondary | ICD-10-CM | POA: Diagnosis not present

## 2021-09-02 DIAGNOSIS — E785 Hyperlipidemia, unspecified: Secondary | ICD-10-CM

## 2021-09-02 DIAGNOSIS — R0602 Shortness of breath: Secondary | ICD-10-CM

## 2021-09-02 DIAGNOSIS — R0683 Snoring: Secondary | ICD-10-CM

## 2021-09-02 DIAGNOSIS — R0681 Apnea, not elsewhere classified: Secondary | ICD-10-CM | POA: Diagnosis not present

## 2021-09-02 DIAGNOSIS — Z9861 Coronary angioplasty status: Secondary | ICD-10-CM

## 2021-09-02 DIAGNOSIS — I2510 Atherosclerotic heart disease of native coronary artery without angina pectoris: Secondary | ICD-10-CM

## 2021-09-02 NOTE — Progress Notes (Signed)
Cardiology Office Note   Date:  09/02/2021   ID:  Erik Hancock, DOB [birth year], MRN [id number]  PCP:  Donita Brooks, MD  Cardiologist:   Chilton Si, MD   No chief complaint on file.   History of Present Illness: Erik Hancock is a 70 y.o. male with CAD s/p PCI, OSA, diabetes, hypertension and hyperlipidemia here for follow-up.  He initially presented to establish care 07/2018.  At that appointment he reported increasing exertional dyspnea.  Symptoms were progressive over the preceding year.  He had no chest pain but did get discomfort in his left arm.  This was similar to when he previously required coronary stenting.  In 2012 [name] had angina and underwent PCI of the LAD.  He had no other CAD.   He had an echo 01/2014 that revealed LVEF 60-65% and an moderately dilated RV.  He was referred for left heart catheterization.  At that time he was found to have three-vessel CAD with stents in LAD, left circumflex, and PDA.  He was noted to have a 95% ostial LAD lesion that was successfully treated with scoring balloon angioplasty and implantation of an Orsiro 3.5x13 drug-eluting stent.  [name] had a sleep study that was positive.  It was felt that he would benefit more from a BiPAP but he declined.   [name] followed up with Erik Shelter, PA on 02/2020 and reported occasional atypical chest pain.  Ticagrelor was reduced to 60 mg twice daily.  He notes that he has been feeling overall well.  He struggles with diffuse arthritis that limits his ability to exercise.  He had notes that he has been more short of breath lately when he does strenuous tasks around his [place].  He still snores loudly and does not feel well rested in the morning.  His wife notes that he often stops breathing at night.  He denies any lower extremity edema, orthopnea, or PND.  He has no chest pain or pressure.   Past Medical History:  Diagnosis Date   Arthritis    "all over" (07/29/2018)   CAD S/P  percutaneous coronary angioplasty    a. stent to mLAD 11/2001. b. DES to PDA/mLCx 2007. c. DES to ostial LAD 07/2018.   Chronic lower back pain    CKD (chronic kidney disease), stage III (HCC)    CKD (chronic kidney disease), stage III (HCC)    Diabetic retinopathy (HCC)    Diabetic retinopathy (HCC)    GERD (gastroesophageal reflux disease)    High cholesterol    History of kidney stones    Hypertension    Mobitz type 2 second degree atrioventricular block    a. noted during 07/2018 adm, metoprolol discontinued.   Morbid obesity (HCC) 07/28/2018   Prostatitis    Pulmonary nodule 2013   CT   Sleep apnea    stopbang=5   Suspected sleep apnea    Type II diabetes mellitus (HCC)     Past Surgical History:  Procedure Laterality Date   CARDIAC CATHETERIZATION  JUNE 2003   PATENT LAD STENT/ BODERLINE OBSTRUCTIVE DISEASE POSTERIOR DESCENDING ARTERY/ NORMAL LVF   CATARACT EXTRACTION W/ INTRAOCULAR LENS  IMPLANT, BILATERAL Bilateral 2017   CERVICAL SCOVILLE FORAMINOTOMY W/ EXCISION OF HERNIATED NUCLEC PULPOSUS  2009   C6 - 7   CORONARY ANGIOPLASTY WITH STENT PLACEMENT  03/30/2006   DR EDMUNDS - 90% LCx@OM2  TAXUS  DES 3.0 X 20 -> 3.25 mm,   95% RPDA --  TAXUS DES 3.0 X 16 --> 3.5 mm   CORONARY ANGIOPLASTY WITH STENT PLACEMENT  11/2001   (Dr. Ty Hilts for Dr. Mayford Knife) - mLAD@D2  - TAXUS EXPRESS DES 3.5 x 15    CORONARY ANGIOPLASTY WITH STENT PLACEMENT  07/29/2018   CORONARY STENT INTERVENTION N/A 07/29/2018   Procedure: CORONARY STENT INTERVENTION;  Surgeon: Erik Lex, MD;  Location: [facility] INVASIVE CV LAB;  Service: Cardiovascular;  Laterality: N/A;   CYSTOSCOPY W/ RETROGRADES  05/04/2012   Procedure: CYSTOSCOPY WITH RETROGRADE PYELOGRAM;  Surgeon: Erik Cage, MD;  Location: [facility];  Service: Urology;  Laterality: Left;   CYSTOSCOPY W/ URETERAL STENT PLACEMENT  05/04/2012   Procedure: CYSTOSCOPY WITH STENT REPLACEMENT;  Surgeon: Erik Cage, MD;  Location: [facility];  Service:  Urology;  Laterality: Left;   CYSTOSCOPY WITH STENT PLACEMENT Left 04/04/2012   CYSTOSCOPY WITH URETEROSCOPY  05/04/2012   Procedure: CYSTOSCOPY WITH URETEROSCOPY;  Surgeon: Erik Cage, MD;  Location: [facility];  Service: Urology;  Laterality: Left;       LEFT HEART CATH AND CORONARY ANGIOGRAPHY N/A 07/29/2018   Procedure: LEFT HEART CATH AND CORONARY ANGIOGRAPHY;  Surgeon: Erik Lex, MD;  Location: [facility] INVASIVE CV LAB;  Service: Cardiovascular;  Laterality: N/A;   LEFT URETEROSCOPIC STONE EXTRACTION  03-14-2003   X2     Current Outpatient Medications  Medication Sig Dispense Refill   ACCU-CHEK AVIVA PLUS test strip USE STRIP TO CHECK GLUCOSE TWICE DAILY TO THREE TIMES DAILY **NEED OFFICE VISIT FOR REFILLS** 100 each 0   aspirin 81 MG tablet Take 81 mg by mouth daily.     BRILINTA 60 MG TABS tablet TAKE 1 TABLET BY MOUTH TWICE DAILY . APPOINTMENT REQUIRED FOR FUTURE REFILLS (DISCONTINUE  90  MG) 180 tablet 0   cephALEXin (KEFLEX) 500 MG capsule Take 1 capsule (500 mg total) by mouth 4 (four) times daily. 40 capsule 0   Continuous Blood Gluc Sensor (FREESTYLE LIBRE 2 SENSOR) MISC 1 Device by Does not apply route every 14 (fourteen) days. 6 each 3   Insulin Aspart FlexPen 100 UNIT/ML SOPN Inject 30 Units into the skin 2 (two) times daily with a meal. 60 mL 3   insulin glargine (LANTUS SOLOSTAR) 100 UNIT/ML Solostar Pen Inject 15 Units into the skin at bedtime. 30 mL 2   Insulin Pen Needle (BD ULTRA-FINE PEN NEEDLES) 29G X 12.7MM MISC 1 Device by Other route in the morning, at noon, in the evening, and at bedtime. 360 each 3   JARDIANCE 25 MG TABS tablet Take 1 tablet by mouth once daily 90 tablet 3   liraglutide (VICTOZA) 18 MG/3ML SOPN Inject 1.8 mg into the skin daily. 18 mL 3   nitroGLYCERIN (NITROLINGUAL) 0.4 MG/SPRAY spray Place 1 spray under the tongue every 5 (five) minutes x 3 doses as needed for chest pain. 12 g 12   pantoprazole (PROTONIX) 40 MG tablet TAKE 1 TABLET BY  MOUTH ONCE DAILY **REQUIRES  OFFICE  VISIT  BEFORE  ANY  FURTHER  REFILLS  CAN  BE  GIVEN 90 tablet 0   rosuvastatin (CRESTOR) 10 MG tablet Take 1 tablet (10 mg total) by mouth daily. 90 tablet 3   No current facility-administered medications for this visit.    Allergies:   Beta adrenergic blockers    Social History:  The patient  reports that he has never smoked. He has never used smokeless tobacco. He reports that he does not drink alcohol and does not use drugs.  Family History:  The patient's family history includes Alzheimer's disease in his father; Diabetes in his father; Heart attack in his father and paternal grandfather; Heart disease in his father and paternal grandfather; Hypertension in his mother; Lupus in his sister.    ROS:  Please see the history of present illness.   Otherwise, review of systems are positive for none.   All other systems are reviewed and negative.    PHYSICAL EXAM: VS:  BP 132/78   Pulse 65   Ht 5\' 8"  (1.727 m)   Wt 271 lb 9.6 oz (123.2 kg)   BMI 41.30 kg/m  , BMI Body mass index is 41.3 kg/m. GENERAL:  Well appearing HEENT: Pupils equal round and reactive, fundi not visualized, oral mucosa unremarkable NECK:  No jugular venous distention, waveform within normal limits, carotid upstroke brisk and symmetric, no bruits LUNGS:  Clear to auscultation bilaterally HEART:  RRR.  PMI not displaced or sustained,S1 and S2 within normal limits, no S3, no S4, no clicks, no rubs, no murmurs ABD:  Flat, positive bowel sounds normal in frequency in pitch, no bruits, no rebound, no guarding, no midline pulsatile mass, no hepatomegaly, no splenomegaly EXT:  2 plus pulses throughout, no edema, no cyanosis no clubbing SKIN:  No rashes no nodules NEURO:  Cranial nerves II through XII grossly intact, motor grossly intact throughout PSYCH:  Cognitively intact, oriented to person place and time   EKG:  EKG is ordered today. The ekg ordered 07/19/18 demonstrates sinus  rhythm at rate 60 bpm.  Prior inferior infarct.  Poor R wave progression. 09/02/2021: Sinus rhythm.  Rate 65 bpm.  Prior inferior infarct.  Cannot rule out prior anterior infarct.  First-degree AV block.  LHC 04/08/11: PROCEDURES PERFORMED: 1. Percutaneous coronary intervention/stent implantation, mid LAD. 2. Percutaneous closure, right femoral artery/Perclose.   INDICATION: The patient is a 70 year old man with diabetes, hypertension, and hypercholesterolemia, who has developed exertional angina. Dr. 54 has completed coronary angiography revealing a mid LAD stenosis of 80-90% involving a small to moderate sized diagonal branch. He is to undergo percutaneous intervention at this time for definitive revascularization.   LHC 07/29/18: Ost LAD lesion is 85% stenosed -just prior to previous stent. Previously placed prox LAD DES stent is 5% stenosed. Following scoring balloon angioplasty, A drug-eluting stent was successfully placed overlapping the previous stent proximally, using a STENT ORSIRO 3.5X13 --postdilated to 4.0 mm Post intervention, there is a 0% residual stenosis. Previously placed Prox Cx to Dist Cx stent (DES) is widely patent. Ost 2nd Mrg lesion is 40% stenosed. Ost Cx to Prox Cx lesion is 30% stenosed. Previously placed RPDA-1 stent (DES) is widely patent. RPDA-2 lesion is 40% stenosed just beyond stent. The left ventricular systolic function is normal. The left ventricular ejection fraction is 55-65% by visual estimate. LV end diastolic pressure is normal. Post intervention, there is a 5% residual stenosis. A drug-eluting stent was successfully placed using a STENT ORSIRO 3.5X13.   Three-vessel CAD with stents in the LAD, circumflex and PDA. Culprit lesion is severe 95% proximal edge lesion and ostial LAD -> successfully treated with scoring balloon angioplasty and DES stent overlapping the original stent (STENT ORSIRO 3.5X13 -postdilated to 4.0 mm) Patent stents in  circumflex and RPDA Normal LV function with normal LVEDP.  Recent Labs: 06/26/2021: ALT 20; BUN 10; Creat 1.07; Hemoglobin 15.0; Platelets 356; Potassium 3.8; Sodium 143    Lipid Panel    Component Value Date/Time   CHOL 134 06/26/2021 0921  TRIG 84 06/26/2021 0921   HDL 47 06/26/2021 0921   CHOLHDL 2.9 06/26/2021 0921   VLDL 29 05/10/2017 0832   LDLCALC 71 06/26/2021 0921      Wt Readings from Last 3 Encounters:  09/02/21 271 lb 9.6 oz (123.2 kg)  09/01/21 272 lb (123.4 kg)  08/21/21 268 lb (121.6 kg)      ASSESSMENT AND PLAN:  # Exertional dyspnea:  # CAD: # s/p LAD PCI: Mr. Hardenbrook has known obstructive coronary disease status post PCI.  He reports exertional dyspnea but no chest pain.  Is hard to know what to make of this.  It may be due to obesity and deconditioning.  However given his coronary history we need to get a Lexiscan Myoview to assess for ischemia.  He is unable to walk on a treadmill.  Continue aspirin, rosuvastatin, and ticagrelor.  It was noted that he has had strokes as well as tremor.  His neurologist mentioned that if he wanted to be treated for the tremor he would need to switch ticagrelor to clopidogrel.  Thus far he has been on interested but should he change his mind, that would be fine.  Once his dyspnea is settled, we will refer him to the PREP exercise program at the [facility].  # Obesity:  Referral to [facility].   # OSA:  Diagnosed on sleep study.  He did not go back for the Bipap titration because he doesn't want to buy the machine.  He continues to have symptoms and I suspect that it may be contributing to his shortness of breath.  We will get a home sleep study as he is averse to going back into the center unless he absolutely has to.  He does understand that if this is positive he would need to go in the center for a titration.  # Hypertension: BP controlled off antihypertensives.  # Hyperlipidemia: Lipids are well have been  controlled.  Continue rosuvastatin.  # DM:  Continue Jardiance and other therapies.  He is working with his PCP for improved glucose control.   Current medicines are reviewed at length with the patient today.  The patient does not have concerns regarding medicines.  The following changes have been made:  no change  Labs/ tests ordered today include:  No orders of the defined types were placed in this encounter.    Disposition:   FU with Tabb Croghan C. Duke Salvia, MD, [facility] in 2 months.      Signed, Jenica Costilow C. Duke Salvia, MD, [facility]  09/02/2021 9:15 AM    [facility]

## 2021-09-02 NOTE — Patient Instructions (Addendum)
Medication Instructions:  Your physician recommends that you continue on your current medications as directed. Please refer to the Current Medication list given to you today.   *If you need a refill on your cardiac medications before your next appointment, please call your pharmacy*  Lab Work: NONE   Testing/Procedures: Your physician has requested that you have a lexiscan myoview. For further information please visit [email or web address] Please follow instruction sheet, as given.  Your physician has requested that you have an echocardiogram. Echocardiography is a painless test that uses sound waves to create images of your heart. It provides your doctor with information about the size and shape of your heart and how well your heart's chambers and valves are working. This procedure takes approximately one hour. There are no restrictions for this procedure.  WILL CALL YOU WITH CODE TO ENTER SO YOU CAN START YOUR ITAMAR SLEEP STUDY  MAKE SURE YOU START STUDY AND STOP STUDY ON THE APP   Follow-Up: At [facility], you and your health needs are our priority.  As part of our continuing mission to provide you with exceptional heart care, we have created designated Provider Care Teams.  These Care Teams include your primary Cardiologist (physician) and Advanced Practice Providers (APPs -  Physician Assistants and Nurse Practitioners) who all work together to provide you with the care you need, when you need it.  We recommend signing up for the patient portal called "MyChart".  Sign up information is provided on this After Visit Summary.  MyChart is used to connect with patients for Virtual Visits (Telemedicine).  Patients are able to view lab/test results, encounter notes, upcoming appointments, etc.  Non-urgent messages can be sent to your provider as well.   To learn more about what you can do with MyChart, go to [email or web address].    Your next appointment:   2 month(s)  The format for  your next appointment:   In Person  Provider:   Chilton Si, MD or Gillian Shields, NP  You have been referred to [facility]  Where: [facility] Address: [address] [place] [address] Phone: [phone]  IF YOU DO NOT HEAR FROM THE OFFICE IN 2 WEEKS YOU CAN CALL THEM DIRECTLY AT NUMBER LISTED ABOVE

## 2021-09-02 NOTE — Telephone Encounter (Signed)
Patient needing PA for Itamar sleeping device given at visit Will call patient with code once approved

## 2021-09-15 ENCOUNTER — Other Ambulatory Visit (HOSPITAL_BASED_OUTPATIENT_CLINIC_OR_DEPARTMENT_OTHER): Payer: Self-pay | Admitting: *Deleted

## 2021-09-15 DIAGNOSIS — I10 Essential (primary) hypertension: Secondary | ICD-10-CM

## 2021-09-15 DIAGNOSIS — I2510 Atherosclerotic heart disease of native coronary artery without angina pectoris: Secondary | ICD-10-CM

## 2021-09-15 DIAGNOSIS — R0602 Shortness of breath: Secondary | ICD-10-CM

## 2021-09-15 NOTE — Progress Notes (Signed)
This encounter was created in error - please disregard.

## 2021-09-15 NOTE — Addendum Note (Signed)
Addended by: Regis Bill B on: 09/15/2021 11:19 AM   Modules accepted: Orders, Level of Service, SmartSet

## 2021-09-19 NOTE — Telephone Encounter (Signed)
Advised patient and wife, verbalized understanding  Code [id number]

## 2021-09-21 ENCOUNTER — Encounter (INDEPENDENT_AMBULATORY_CARE_PROVIDER_SITE_OTHER): Payer: Medicare PPO | Admitting: Cardiology

## 2021-09-21 DIAGNOSIS — G4733 Obstructive sleep apnea (adult) (pediatric): Secondary | ICD-10-CM

## 2021-09-22 ENCOUNTER — Telehealth (HOSPITAL_COMMUNITY): Payer: Self-pay

## 2021-09-22 ENCOUNTER — Telehealth: Payer: Self-pay | Admitting: Endocrinology

## 2021-09-22 ENCOUNTER — Telehealth (HOSPITAL_BASED_OUTPATIENT_CLINIC_OR_DEPARTMENT_OTHER): Payer: Self-pay | Admitting: Cardiovascular Disease

## 2021-09-22 NOTE — Telephone Encounter (Signed)
Wife of patient called. The patient did a home sleep study and she is not sure what to do with the equipment he used to do his sleep study.  Please reach out to the wife

## 2021-09-22 NOTE — Telephone Encounter (Signed)
Detailed instructions left on the patient's answering machine. Asked to call back with any questions. S.Edd Reppert EMTP 

## 2021-09-22 NOTE — Telephone Encounter (Signed)
[facility] called to advise that an addendum needs to be made to reflect frequency of injections for each medication. MD order incorrectly completed.  [facility] sending by fax with the same information   [phone] - [facility] call back number

## 2021-09-22 NOTE — Telephone Encounter (Signed)
Spoke to patient's wife.Stated husband is finished with watch pat home sleep kit.Advised RN at [facility] office advised watch pat kits are disposable.

## 2021-09-23 ENCOUNTER — Other Ambulatory Visit: Payer: Self-pay

## 2021-09-23 ENCOUNTER — Ambulatory Visit (HOSPITAL_COMMUNITY): Payer: Medicare PPO | Attending: Cardiovascular Disease

## 2021-09-23 ENCOUNTER — Ambulatory Visit (HOSPITAL_BASED_OUTPATIENT_CLINIC_OR_DEPARTMENT_OTHER): Payer: Medicare PPO

## 2021-09-23 DIAGNOSIS — I10 Essential (primary) hypertension: Secondary | ICD-10-CM

## 2021-09-23 DIAGNOSIS — R0602 Shortness of breath: Secondary | ICD-10-CM

## 2021-09-23 LAB — ECHOCARDIOGRAM COMPLETE
Area-P 1/2: 14.87 cm2
S' Lateral: 2.6 cm

## 2021-09-23 MED ORDER — REGADENOSON 0.4 MG/5ML IV SOLN
0.4000 mg | Freq: Once | INTRAVENOUS | Status: AC
Start: 2021-09-23 — End: 2021-09-23
  Administered 2021-09-23: 0.4 mg via INTRAVENOUS

## 2021-09-23 MED ORDER — PERFLUTREN LIPID MICROSPHERE
1.0000 mL | INTRAVENOUS | Status: AC | PRN
  Administered 2021-09-23: 1 mL via INTRAVENOUS

## 2021-09-23 MED ORDER — TECHNETIUM TC 99M TETROFOSMIN IV KIT
31.5000 | PACK | Freq: Once | INTRAVENOUS | Status: AC | PRN
  Administered 2021-09-23: 31.5 via INTRAVENOUS
  Filled 2021-09-23: qty 32

## 2021-09-24 ENCOUNTER — Ambulatory Visit (HOSPITAL_COMMUNITY): Payer: Medicare PPO | Attending: Cardiovascular Disease

## 2021-09-24 ENCOUNTER — Other Ambulatory Visit: Payer: Self-pay

## 2021-09-24 LAB — MYOCARDIAL PERFUSION IMAGING
LV dias vol: 115 mL (ref 62–150)
LV sys vol: 51 mL
Nuc Stress EF: 56 %
Peak HR: 81 {beats}/min
Rest HR: 69 {beats}/min
Rest Nuclear Isotope Dose: 31.1 mCi
SDS: 0
SRS: 2
SSS: 3
ST Depression (mm): 0 mm
Stress Nuclear Isotope Dose: 31.5 mCi
TID: 0.78

## 2021-09-24 MED ORDER — TECHNETIUM TC 99M TETROFOSMIN IV KIT
31.1000 | PACK | Freq: Once | INTRAVENOUS | Status: AC | PRN
  Administered 2021-09-24: 31.1 via INTRAVENOUS
  Filled 2021-09-24: qty 32

## 2021-09-29 NOTE — Telephone Encounter (Signed)
Pt calling saying we're missing information on the [facility] application, asking if we can reach out to them to find the issue. [facility] [phone]

## 2021-10-01 NOTE — Telephone Encounter (Signed)
Papers faxed with correction

## 2021-10-02 ENCOUNTER — Encounter (INDEPENDENT_AMBULATORY_CARE_PROVIDER_SITE_OTHER): Payer: Medicare PPO | Admitting: Ophthalmology

## 2021-10-02 ENCOUNTER — Other Ambulatory Visit: Payer: Self-pay

## 2021-10-02 DIAGNOSIS — E119 Type 2 diabetes mellitus without complications: Secondary | ICD-10-CM | POA: Diagnosis not present

## 2021-10-02 DIAGNOSIS — E113313 Type 2 diabetes mellitus with moderate nonproliferative diabetic retinopathy with macular edema, bilateral: Secondary | ICD-10-CM | POA: Diagnosis not present

## 2021-10-02 DIAGNOSIS — H43813 Vitreous degeneration, bilateral: Secondary | ICD-10-CM

## 2021-10-02 DIAGNOSIS — I10 Essential (primary) hypertension: Secondary | ICD-10-CM

## 2021-10-02 DIAGNOSIS — E1122 Type 2 diabetes mellitus with diabetic chronic kidney disease: Secondary | ICD-10-CM | POA: Diagnosis not present

## 2021-10-02 DIAGNOSIS — H35033 Hypertensive retinopathy, bilateral: Secondary | ICD-10-CM

## 2021-10-06 ENCOUNTER — Other Ambulatory Visit: Payer: Self-pay | Admitting: Endocrinology

## 2021-10-06 ENCOUNTER — Other Ambulatory Visit: Payer: Self-pay

## 2021-10-06 DIAGNOSIS — N1831 Chronic kidney disease, stage 3a: Secondary | ICD-10-CM

## 2021-10-06 MED ORDER — VICTOZA 18 MG/3ML ~~LOC~~ SOPN: 1.8000 mg | PEN_INJECTOR | Freq: Every day | SUBCUTANEOUS | 3 refills | Status: DC

## 2021-10-06 NOTE — Telephone Encounter (Signed)
Rx sent 

## 2021-10-09 ENCOUNTER — Other Ambulatory Visit: Payer: Self-pay | Admitting: Cardiovascular Disease

## 2021-10-09 ENCOUNTER — Other Ambulatory Visit: Payer: Self-pay | Admitting: Family Medicine

## 2021-10-09 NOTE — Telephone Encounter (Signed)
Rx(s) sent to pharmacy electronically.  

## 2021-10-28 ENCOUNTER — Ambulatory Visit: Payer: Medicare PPO | Admitting: Endocrinology

## 2021-10-31 ENCOUNTER — Encounter: Payer: Self-pay | Admitting: Cardiology

## 2021-10-31 NOTE — Procedures (Signed)
    Sleep Study Report  Patient Information Study Date: 09/21/21 Patient Name: Erik Hancock Patient ID: [id number] Birth Date: 2051/07/13 Age: 70 Gender: Male BMI: 41.1 (W=271 lb, H=5' 8'')  Referring Physician: Chilton Si, MD  TEST DESCRIPTION: Home sleep apnea testing was completed using the WatchPat, a Type 1 device, utilizing peripheral arterial tonometry (PAT), chest movement, actigraphy, pulse oximetry, pulse rate, body position and snore. AHI was calculated with apnea and hypopnea using valid sleep time as the denominator. RDI includes apneas, hypopneas, and RERAs. The data acquired and the scoring of sleep and all associated events were performed in accordance with the recommended standards and specifications as outlined in the AASM Manual for the Scoring of Sleep and Associated Events 2.2.0 (2015).  FINDINGS: 1. Moderate Obstructive Sleep Apnea with AHI 22.9/hr. 2. No Central Sleep Apnea with pAHIc 2.5/hr. 3. Oxygen desaturations as low as 84%. 4. Mild to moderate snoring was present. O2 sats were < 88% for 1 min. 5. Total sleep time was 8 hrs and 17 min. 6. 13.5% of total sleep time was spent in REM sleep. 7. Normal sleep onset latency at 22 min 8. Shortened REM sleep onset latency at 53 min. 9. Total awakenings were 7.  DIAGNOSIS: Moderate Obstructive Sleep Apnea (G47.33)  RECOMMENDATIONS: 1. Clinical correlation of these findings is necessary. The decision to treat obstructive sleep apnea (OSA) is usually based on the presence of apnea symptoms or the presence of associated medical conditions such as Hypertension, Congestive Heart Failure, Atrial Fibrillation or Obesity. The most common symptoms of OSA are snoring, gasping for breath while sleeping, daytime sleepiness and fatigue.  2. Initiating apnea therapy is recommended given the presence of symptoms and/or associated conditions. Recommend proceeding with one of the following:   a. Auto-CPAP therapy  with a pressure range of 5-20cm H2O.   b. An oral appliance (OA) that can be obtained from certain dentists with expertise in sleep medicine. These are primarily of use in non-obese patients with mild and moderate disease.   c. An ENT consultation which may be useful to look for specific causes of obstruction and possible treatment options.   d. If patient is intolerant to PAP therapy, consider referral to ENT for evaluation for hypoglossal nerve stimulator.  3. Close follow-up is necessary to ensure success with CPAP or oral appliance therapy for maximum benefit .  4. A follow-up oximetry study on CPAP is recommended to assess the adequacy of therapy and determine the need for supplemental oxygen or the potential need for Bi-level therapy. An arterial blood gas to determine the adequacy of baseline ventilation and oxygenation should also be considered.  5. Healthy sleep recommendations include: adequate nightly sleep (normal 7-9 hrs/night), avoidance of caffeine after noon and alcohol near bedtime, and maintaining a sleep environment that is cool, dark and quiet.  6. Weight loss for overweight patients is recommended. Even modest amounts of weight loss can significantly improve the severity of sleep apnea.  7. Snoring recommendations include: weight loss where appropriate, side sleeping, and avoidance of alcohol before bed.  8. Operation of motor vehicle should not be performed when sleepy.  Signature: Electronically Signed: 11/01/21 Armanda Magic, MD; [facility]; Diplomat, American Board of Sleep Medicine

## 2021-10-31 NOTE — Procedures (Signed)
Erroneous encounter

## 2021-10-31 NOTE — Progress Notes (Signed)
This encounter was created in error - please disregard.

## 2021-11-05 ENCOUNTER — Telehealth: Payer: Self-pay | Admitting: Cardiovascular Disease

## 2021-11-05 ENCOUNTER — Ambulatory Visit: Payer: Medicare PPO

## 2021-11-05 DIAGNOSIS — R0681 Apnea, not elsewhere classified: Secondary | ICD-10-CM

## 2021-11-05 DIAGNOSIS — R0683 Snoring: Secondary | ICD-10-CM

## 2021-11-05 DIAGNOSIS — R0602 Shortness of breath: Secondary | ICD-10-CM

## 2021-11-05 DIAGNOSIS — I10 Essential (primary) hypertension: Secondary | ICD-10-CM

## 2021-11-05 NOTE — Telephone Encounter (Signed)
Pt's wife is unable to bring pt to his appt 11/06/21, she wants to know if he can be seen virtually or is it important enough for him to come into the office

## 2021-11-06 ENCOUNTER — Other Ambulatory Visit: Payer: Self-pay

## 2021-11-06 ENCOUNTER — Encounter (HOSPITAL_BASED_OUTPATIENT_CLINIC_OR_DEPARTMENT_OTHER): Payer: Self-pay | Admitting: Cardiovascular Disease

## 2021-11-06 ENCOUNTER — Ambulatory Visit (INDEPENDENT_AMBULATORY_CARE_PROVIDER_SITE_OTHER): Payer: Medicare PPO | Admitting: Cardiovascular Disease

## 2021-11-06 VITALS — BP 159/89 | HR 66 | Ht 68.0 in | Wt 260.0 lb

## 2021-11-06 DIAGNOSIS — I10 Essential (primary) hypertension: Secondary | ICD-10-CM | POA: Diagnosis not present

## 2021-11-06 DIAGNOSIS — Z9861 Coronary angioplasty status: Secondary | ICD-10-CM | POA: Diagnosis not present

## 2021-11-06 DIAGNOSIS — I2510 Atherosclerotic heart disease of native coronary artery without angina pectoris: Secondary | ICD-10-CM | POA: Diagnosis not present

## 2021-11-06 DIAGNOSIS — E785 Hyperlipidemia, unspecified: Secondary | ICD-10-CM | POA: Diagnosis not present

## 2021-11-06 DIAGNOSIS — Z5181 Encounter for therapeutic drug level monitoring: Secondary | ICD-10-CM | POA: Diagnosis not present

## 2021-11-06 DIAGNOSIS — R0609 Other forms of dyspnea: Secondary | ICD-10-CM | POA: Diagnosis not present

## 2021-11-06 MED ORDER — VALSARTAN 160 MG PO TABS: 160.0000 mg | ORAL_TABLET | Freq: Every day | ORAL | 1 refills | Status: DC

## 2021-11-06 NOTE — Patient Instructions (Addendum)
Medication Instructions:  START VALSARTAN 160 MG DAILY   *If you need a refill on your cardiac medications before your next appointment, please call your pharmacy*  Lab Work: BMET IN 1 WEEK   If you have labs (blood work) drawn today and your tests are completely normal, you will receive your results only by: MyChart Message (if you have MyChart) OR A paper copy in the mail If you have any lab test that is abnormal or we need to change your treatment, we will call you to review the results.  Testing/Procedures: HAVE SENT MESSAGE TO SLEEP TO FOLLOW UP ON SLEEP STUDY   Follow-Up: At [facility], you and your health needs are our priority.  As part of our continuing mission to provide you with exceptional heart care, we have created designated Provider Care Teams.  These Care Teams include your primary Cardiologist (physician) and Advanced Practice Providers (APPs -  Physician Assistants and Nurse Practitioners) who all work together to provide you with the care you need, when you need it.  We recommend signing up for the patient portal called "MyChart".  Sign up information is provided on this After Visit Summary.  MyChart is used to connect with patients for Virtual Visits (Telemedicine).  Patients are able to view lab/test results, encounter notes, upcoming appointments, etc.  Non-urgent messages can be sent to your provider as well.   To learn more about what you can do with MyChart, go to [email or web address].    Your next appointment:    12/11/2021 9:15 AM WITH CAITLIN W NP  05/07/2022 9:20 AM WITH DR 

## 2021-11-06 NOTE — Assessment & Plan Note (Signed)
Continue rosuvastatin.  LDL was 71.

## 2021-11-06 NOTE — Assessment & Plan Note (Signed)
Blood pressure is poorly controlled.  He was congratulated on his weight loss efforts.  Encouraged him to increase his exercise as well.  He should be getting at least 150 minutes weekly.  Given that he also has diabetes and chronic kidney disease, we will start valsartan 160 mg daily.  Check a BMP in a week.

## 2021-11-06 NOTE — Assessment & Plan Note (Signed)
Echo and nuclear stress test were unremarkable.  His symptoms are due to obesity and deconditioning.  He is going to continue working on exercise and weight loss.

## 2021-11-06 NOTE — Assessment & Plan Note (Signed)
Stable without angina.  He has exertional dyspnea but Lexiscan Myoview was negative for ischemia earlier this year.  I suspect his symptoms are more attributable to obesity and deconditioning.  He is going to continue working on weight loss.  Continue aspirin, Jardiance, ticagrelor, and rosuvastatin.  He is on long-term dual antiplatelet therapy due to ostial LAD stents.

## 2021-11-06 NOTE — Telephone Encounter (Signed)
Patient had telephone visit today.

## 2021-11-06 NOTE — Progress Notes (Signed)
Virtual Visit via Telephone Note   Date:  11/06/2021   ID:  Erik Hancock, DOB Sep 07, 1951, MRN [id number]  PCP:  Donita Brooks, MD  Cardiologist:   Chilton Si, MD   No chief complaint on file.  This visit type was conducted due to national recommendations for restrictions regarding the COVID-19 Pandemic (e.g. social distancing) in an effort to limit this patient's exposure and mitigate transmission in our community.  Due to her co-morbid illnesses, this patient is at least at moderate risk for complications without adequate follow up.  This format is felt to be most appropriate for this patient at this time.  All issues noted in this document were discussed and addressed.  A limited physical exam was performed with this format.  Please refer to the patient's chart for her consent to telehealth for [facility].   Patient location: Home Provider location: Office  History of Present Illness: Erik Hancock is a 70 y.o. male with CAD s/p PCI, OSA on CPAP, diabetes, hypertension and hyperlipidemia here for follow-up.  He initially presented to establish care 07/2018.  At that appointment he reported increasing exertional dyspnea.  Symptoms were progressive over the preceding year.  He had no chest pain but did get discomfort in his left arm.  This was similar to when he previously required coronary stenting.  In 2012 Erik Hancock had angina and underwent PCI of the LAD.  He had no other CAD.   He had an echo 01/2014 that revealed LVEF 60-65% and an moderately dilated RV.  He was referred for left heart catheterization.  At that time he was found to have three-vessel CAD with stents in LAD, left circumflex, and PDA.  He was noted to have a 95% ostial LAD lesion that was successfully treated with scoring balloon angioplasty and implantation of an Orsiro 3.5x13 drug-eluting stent.  Mr. Kading had a sleep study that was positive.  It was felt that he would benefit more from a BiPAP but he  declined.   Mr. Mannan followed up with Corine Shelter, PA on 02/2020 and reported occasional atypical chest pain.  Ticagrelor was reduced to 60 mg twice daily. At his appointment on 08/2021, he continued to report atypical chest pain. He underwent a nuclear stress test 09/2021 that was low risk with LVEF 56%. Echo performed 09/2021 revealed grade 1 diastolic dysfunction and his ascending aorta was 3.8cm. He had a home sleep study and CPAP was recommended.   Today, he is doing well. He is trying to exercise by walking and doing yard work. He is bothered by his arthritis and he becomes short of breath. He has lost some weight which has helped improve his breathing. He cut back on candy and his wife has been cooking him healthier meals. His blood pressure typically runs high. At home, he usually records measurements of 140-150s systolic and 70-80s diastolic. He has not been reached out to regarding his CPAP. He denies any palpitations, chest pain, lightheadedness, headaches, syncope, orthopnea, PND, or lower extremity edema.  Past Medical History:  Diagnosis Date   Arthritis    "all over" (07/29/2018)   CAD S/P percutaneous coronary angioplasty    a. stent to mLAD 11/2001. b. DES to PDA/mLCx 2007. c. DES to ostial LAD 07/2018.   Chronic lower back pain    CKD (chronic kidney disease), stage III (HCC)    CKD (chronic kidney disease), stage III (HCC)    Diabetic retinopathy (HCC)  Diabetic retinopathy ([place])    GERD (gastroesophageal reflux disease)    High cholesterol    History of kidney stones    Hypertension    Mobitz type 2 second degree atrioventricular block    a. noted during 07/2018 adm, metoprolol discontinued.   Morbid obesity ([place]) 07/28/2018   Prostatitis    Pulmonary nodule 2013   CT   Sleep apnea    stopbang=5   Suspected sleep apnea    Type II diabetes mellitus (HCC)     Past Surgical History:  Procedure Laterality Date   CARDIAC CATHETERIZATION  JUNE 2003   PATENT LAD STENT/  BODERLINE OBSTRUCTIVE DISEASE POSTERIOR DESCENDING ARTERY/ NORMAL LVF   CATARACT EXTRACTION W/ INTRAOCULAR LENS  IMPLANT, BILATERAL Bilateral 2017   CERVICAL SCOVILLE FORAMINOTOMY W/ EXCISION OF HERNIATED NUCLEC PULPOSUS  2009   C6 - 7   CORONARY ANGIOPLASTY WITH STENT PLACEMENT  03/30/2006   DR EDMUNDS - 90% LCx@OM2  TAXUS  DES 3.0 X 20 -> 3.25 mm,   95% RPDA -- TAXUS DES 3.0 X 16 --> 3.5 mm   CORONARY ANGIOPLASTY WITH STENT PLACEMENT  11/2001   (Dr. Ilda Foil for Dr. Radford Pax) - mLAD@D2  - TAXUS EXPRESS DES 3.5 x 15    CORONARY ANGIOPLASTY WITH STENT PLACEMENT  07/29/2018   CORONARY STENT INTERVENTION N/A 07/29/2018   Procedure: CORONARY STENT INTERVENTION;  Surgeon: Leonie Man, MD;  Location: [place] [facility];  Service: Cardiovascular;  Laterality: N/A;   CYSTOSCOPY W/ RETROGRADES  05/04/2012   Procedure: CYSTOSCOPY WITH RETROGRADE PYELOGRAM;  Surgeon: Molli Hazard, MD;  Location: [facility];  Service: Urology;  Laterality: Left;   CYSTOSCOPY W/ URETERAL STENT PLACEMENT  05/04/2012   Procedure: CYSTOSCOPY WITH STENT REPLACEMENT;  Surgeon: Molli Hazard, MD;  Location: [facility];  Service: Urology;  Laterality: Left;   CYSTOSCOPY WITH STENT PLACEMENT Left 04/04/2012   CYSTOSCOPY WITH URETEROSCOPY  05/04/2012   Procedure: CYSTOSCOPY WITH URETEROSCOPY;  Surgeon: Molli Hazard, MD;  Location: [facility];  Service: Urology;  Laterality: Left;       LEFT HEART CATH AND CORONARY ANGIOGRAPHY N/A 07/29/2018   Procedure: LEFT HEART CATH AND CORONARY ANGIOGRAPHY;  Surgeon: Leonie Man, MD;  Location: [place] [facility];  Service: Cardiovascular;  Laterality: N/A;   LEFT URETEROSCOPIC STONE EXTRACTION  03-14-2003   X2     Current Outpatient Medications  Medication Sig Dispense Refill   aspirin 81 MG tablet Take 81 mg by mouth daily.     Insulin Aspart FlexPen 100 UNIT/ML SOPN Inject 30 Units into the skin 2 (two) times daily with a meal. 60 mL 3   insulin glargine (LANTUS  SOLOSTAR) 100 UNIT/ML Solostar Pen Inject 15 Units into the skin at bedtime. 30 mL 2   JARDIANCE 25 MG TABS tablet Take 1 tablet by mouth once daily 90 tablet 0   liraglutide (VICTOZA) 18 MG/3ML SOPN Inject 1.8 mg into the skin daily. 18 mL 3   nitroGLYCERIN (NITROLINGUAL) 0.4 MG/SPRAY spray Place 1 spray under the tongue every 5 (five) minutes x 3 doses as needed for chest pain. 12 g 12   pantoprazole (PROTONIX) 40 MG tablet TAKE 1 TABLET BY MOUTH ONCE DAILY **REQUIRES  OFFICE  VISIT  BEFORE  ANY  FURTHER  REFILLS  CAN  BE  GIVEN 90 tablet 0   rosuvastatin (CRESTOR) 10 MG tablet Take 1 tablet (10 mg total) by mouth daily. 90 tablet 3   ticagrelor (BRILINTA) 60 MG TABS tablet Take  1 tablet (60 mg total) by mouth 2 (two) times daily. 180 tablet 2   ACCU-CHEK AVIVA PLUS test strip USE STRIP TO CHECK GLUCOSE TWICE DAILY TO THREE TIMES DAILY **NEED OFFICE VISIT FOR REFILLS** 100 each 0   Continuous Blood Gluc Sensor (FREESTYLE LIBRE 2 SENSOR) MISC 1 Device by Does not apply route every 14 (fourteen) days. 6 each 3   Insulin Pen Needle (BD ULTRA-FINE PEN NEEDLES) 29G X 12.7MM MISC 1 Device by Other route in the morning, at noon, in the evening, and at bedtime. 360 each 3   No current facility-administered medications for this visit.    Allergies:   Beta adrenergic blockers    Social History:  The patient  reports that he has never smoked. He has never used smokeless tobacco. He reports that he does not drink alcohol and does not use drugs.   Family History:  The patient's family history includes Alzheimer's disease in his father; Diabetes in his father; Heart attack in his father and paternal grandfather; Heart disease in his father and paternal grandfather; Hypertension in his mother; Lupus in his sister.    ROS:  Please see the history of present illness.    (+) Shortness of breath All other systems are reviewed and negative.    PHYSICAL EXAM: BP (!) 159/89    Pulse 66    Ht 5\' 8"  (1.727 m)     Wt 260 lb (117.9 kg)    BMI 39.53 kg/m  GENERAL: Sounds well.  No acute distress. RESP: No respiratory distress. NEURO:  Speech fluent.  C PSYCH:  Cognitively intact, oriented to person place and time  EKG:  EKG was not ordered today 09/02/2021: Sinus rhythm.  Rate 65 bpm.  Prior inferior infarct.  Cannot rule out prior anterior infarct.  First-degree AV block. 07/19/18 demonstrates sinus rhythm at rate 60 bpm.  Prior inferior infarct.  Poor R wave progression.  Lexiscan Myocar 09/24/21 Low risk stress nuclear study with normal perfusion and normal left ventricular regional and global systolic function.  Echo 09/23/21 1. Left ventricular ejection fraction, by estimation, is 55 to 60%. The  left ventricle has normal function. The left ventricle has no regional  wall motion abnormalities. There is mild left ventricular hypertrophy.  Left ventricular diastolic parameters  are consistent with Grade I diastolic dysfunction (impaired relaxation).   2. Right ventricular systolic function is normal. The right ventricular  size is normal. Tricuspid regurgitation signal is inadequate for assessing  PA pressure.   3. The mitral valve is normal in structure. No evidence of mitral valve  regurgitation. No evidence of mitral stenosis.   4. The aortic valve is tricuspid. Aortic valve regurgitation is not  visualized. Mild aortic valve sclerosis is present, with no evidence of  aortic valve stenosis.   5. Aortic dilatation noted. There is mild dilatation of the ascending  aorta, measuring 38 mm.   6. The inferior vena cava is normal in size with greater than 50%  respiratory variability, suggesting right atrial pressure of 3 mmHg.   LHC 07/29/18: Ost LAD lesion is 85% stenosed -just prior to previous stent. Previously placed prox LAD DES stent is 5% stenosed. Following scoring balloon angioplasty, A drug-eluting stent was successfully placed overlapping the previous stent proximally, using a STENT  ORSIRO 3.5X13 --postdilated to 4.0 mm Post intervention, there is a 0% residual stenosis. Previously placed Prox Cx to Dist Cx stent (DES) is widely patent. Ost 2nd Mrg lesion is 40% stenosed. Ost Cx  to Prox Cx lesion is 30% stenosed. Previously placed RPDA-1 stent (DES) is widely patent. RPDA-2 lesion is 40% stenosed just beyond stent. The left ventricular systolic function is normal. The left ventricular ejection fraction is 55-65% by visual estimate. LV end diastolic pressure is normal. Post intervention, there is a 5% residual stenosis. A drug-eluting stent was successfully placed using a STENT ORSIRO 3.5X13.   Three-vessel CAD with stents in the LAD, circumflex and PDA. Culprit lesion is severe 95% proximal edge lesion and ostial LAD -> successfully treated with scoring balloon angioplasty and DES stent overlapping the original stent (STENT ORSIRO 3.5X13 -postdilated to 4.0 mm) Patent stents in circumflex and RPDA Normal LV function with normal LVEDP.  LHC 04/08/11: PROCEDURES PERFORMED: 1. Percutaneous coronary intervention/stent implantation, mid LAD. 2. Percutaneous closure, right femoral artery/Perclose.   INDICATION: The patient is a 70 year old man with diabetes, hypertension, and hypercholesterolemia, who has developed exertional angina. Dr. Radford Pax has completed coronary angiography revealing a mid LAD stenosis of 80-90% involving a small to moderate sized diagonal branch. He is to undergo percutaneous intervention at this time for definitive revascularization.  Recent Labs: 06/26/2021: ALT 20; BUN 10; Creat 1.07; Hemoglobin 15.0; Platelets 356; Potassium 3.8; Sodium 143    Lipid Panel    Component Value Date/Time   CHOL 134 06/26/2021 0921   TRIG 84 06/26/2021 0921   HDL 47 06/26/2021 0921   CHOLHDL 2.9 06/26/2021 0921   VLDL 29 05/10/2017 0832   LDLCALC 71 06/26/2021 0921      Wt Readings from Last 3 Encounters:  11/06/21 260 lb (117.9 kg)  09/23/21 271 lb (122.9  kg)  09/02/21 271 lb 9.6 oz (123.2 kg)      ASSESSMENT AND PLAN:  CAD S/P percutaneous coronary angioplasty Stable without angina.  He has exertional dyspnea but Lexiscan Myoview was negative for ischemia earlier this year.  I suspect his symptoms are more attributable to obesity and deconditioning.  He is going to continue working on weight loss.  Continue aspirin, Jardiance, ticagrelor, and rosuvastatin.  He is on long-term dual antiplatelet therapy due to ostial LAD stents.  Essential hypertension Blood pressure is poorly controlled.  He was congratulated on his weight loss efforts.  Encouraged him to increase his exercise as well.  He should be getting at least 150 minutes weekly.  Given that he also has diabetes and chronic kidney disease, we will start valsartan 160 mg daily.  Check a BMP in a week.  Dyslipidemia, goal LDL below 70 Continue rosuvastatin.  LDL was 71.  Exertional dyspnea Echo and nuclear stress test were unremarkable.  His symptoms are due to obesity and deconditioning.  He is going to continue working on exercise and weight loss.  Time spent: 22 minutes-Greater than 50% of this time was spent in counseling, explanation of diagnosis, planning of further management, and coordination of care.   Current medicines are reviewed at length with the patient today.  The patient does not have concerns regarding medicines.  The following changes have been made:  start valsartan   Labs/ tests ordered today include:  No orders of the defined types were placed in this encounter.  Disposition:   FU with APP in 1 month FU with Trini Soldo C. Oval Linsey, MD, [facility] in 6 months.    I,Mykaella Javier,acting as a scribe for Skeet Latch, MD.,have documented all relevant documentation on the behalf of Skeet Latch, MD,as directed by  Skeet Latch, MD while in the presence of Skeet Latch, MD.  I,  Rihaan Barrack C. Oval Linsey, MD have reviewed all documentation for this visit.  The  documentation of the exam, diagnosis, procedures, and orders on 11/06/2021 are all accurate and complete.   Signed, Ramonia Mcclaran C. Oval Linsey, MD, [facility]  11/06/2021 8:51 AM    [place]

## 2021-11-11 ENCOUNTER — Telehealth: Payer: Self-pay | Admitting: *Deleted

## 2021-11-11 DIAGNOSIS — G4733 Obstructive sleep apnea (adult) (pediatric): Secondary | ICD-10-CM

## 2021-11-11 NOTE — Telephone Encounter (Signed)
-----   Message from Quintella Reichert, MD sent at 10/31/2021  9:03 PM EST ----- Please let patient know that they have sleep apnea.  Recommend therapeutic CPAP titration for treatment of patient's sleep disordered breathing.  If unable to perform an in lab titration then initiate ResMed auto CPAP from 4 to 15cm H2O with heated humidity and mask of choice and overnight pulse ox on CPAP.

## 2021-11-11 NOTE — Telephone Encounter (Addendum)
The patient has been notified of the result and verbalized understanding.  All questions (if any) were answered. Erik Hancock, CMA 11/11/2021 6:03 PM   Titration to pending  Patient states he can not sleep in the sleep lab and he has declined to go or even try the Cpap.[name]  Upon patient request DME selection is [facility] Patient understands he will be contacted by [facility] to set up his cpap. Patient understands to call if [facility] does not contact him with new setup in a timely manner. Patient understands they will be called once confirmation has been received from [facility] that they have received their new machine to schedule 10 week follow up appointment.   [facility] notified of new cpap order  Please add to airview Patient was grateful for the call and thanked me

## 2021-11-12 NOTE — Telephone Encounter (Signed)
Prior Authorization for titration sent to [facility] via web portal. Valid dates:  11/29/21- 12/29/21. AUTH Number [id number] .

## 2021-11-13 ENCOUNTER — Other Ambulatory Visit: Payer: Self-pay | Admitting: Family Medicine

## 2021-11-13 ENCOUNTER — Telehealth: Payer: Self-pay

## 2021-11-13 MED ORDER — MOLNUPIRAVIR EUA 200MG CAPSULE: 4.0000 | ORAL_CAPSULE | Freq: Two times a day (BID) | ORAL | 0 refills | Status: AC

## 2021-11-13 NOTE — Telephone Encounter (Signed)
Spoke with pt's wife and advised of rx. Also advised pt can take Coricidin HBP, Delsym and Tylenol/Advil/Aleve for other symptoms. Nothing further needed at this time.

## 2021-11-13 NOTE — Telephone Encounter (Signed)
Spoke with pt's wife and she reports pt had nasal congestion that started yesterday. Pt now also has cough. He tested positive for COVID today. She denies fever or shob. He has been taking AlkaSeltzer cold medication with some improvement.  Please advise, thanks!

## 2021-11-13 NOTE — Telephone Encounter (Signed)
Pt's wife called in stating that pt has tested positive for Covid and would like something sent in to pharmacy for this. Pt's wife would like prescription sent to [facility] on [address]. Please advise.  Cb#: [phone]

## 2021-11-20 NOTE — Telephone Encounter (Signed)
Patient is scheduled for CPAP Titration on 12/15/21. Patient understands his titration study will be done at [facility] sleep lab. Patient understands he will receive a letter in a week or so detailing appointment, date, time, and location. Patient understands to call if he does not receive the letter  in a timely manner. Patient agrees with treatment and thanked me for call.

## 2021-11-27 ENCOUNTER — Encounter (INDEPENDENT_AMBULATORY_CARE_PROVIDER_SITE_OTHER): Payer: Medicare PPO | Admitting: Ophthalmology

## 2021-11-28 ENCOUNTER — Ambulatory Visit: Payer: Medicare PPO | Admitting: Endocrinology

## 2021-12-01 ENCOUNTER — Other Ambulatory Visit: Payer: Self-pay | Admitting: Family Medicine

## 2021-12-04 ENCOUNTER — Other Ambulatory Visit: Payer: Self-pay

## 2021-12-04 ENCOUNTER — Encounter (INDEPENDENT_AMBULATORY_CARE_PROVIDER_SITE_OTHER): Payer: Medicare PPO | Admitting: Ophthalmology

## 2021-12-04 DIAGNOSIS — E113313 Type 2 diabetes mellitus with moderate nonproliferative diabetic retinopathy with macular edema, bilateral: Secondary | ICD-10-CM

## 2021-12-04 DIAGNOSIS — H35033 Hypertensive retinopathy, bilateral: Secondary | ICD-10-CM

## 2021-12-04 DIAGNOSIS — H43813 Vitreous degeneration, bilateral: Secondary | ICD-10-CM | POA: Diagnosis not present

## 2021-12-04 DIAGNOSIS — I10 Essential (primary) hypertension: Secondary | ICD-10-CM

## 2021-12-05 NOTE — Progress Notes (Signed)
Assessment/Plan:    1.  ET Plus   -Patient does have a component of resting postural tremor.  I do not see any evidence of a neurodegenerative process like Parkinson's disease  -Discussed with patient that if he wanted to try something, his Brilinta would need to be changed to Plavix.  His cardiologist, Dr. Duke Salvia, previously agreed to that.  Ultimately, patient not interested in adding more medication.. I think that is reasonable.  2.  Diabetic peripheral neuropathy  -likely the source of balance issues.  3.  Abnormal brain scan             -Suspect that the old lacune's in the right cerebellum are completely asymptomatic.  Reviewed MRI of the brain personally.  The lacunar infarctions are not new.  Patient is on 81 mg aspirin and Lipitor, 80 mg daily. Last A1c was 8.0.  Ideally, I would like to see that under 7.  4.  He will follow-up with me on an as-needed basis.  He was given a list of signs and symptoms to watch out for and that would be important to come back if he had any of those.   Subjective:   Erik Hancock was seen today in follow up for tremor.  My previous records were reviewed prior to todays visit.  Patient is with his wife who supplements the history.  When I saw the patient last visit, he had a combination of rest and postural tremor along with head tremor.  Tremor is stable.  Not interfering with ADL's.  Since our last visit, he continues to follow with cardiology.  Meds have not changed.  He remains on Brilinta (contraindication to primidone).   PREVIOUS MEDICATIONS: Unable to do beta-blocker (stopped in the hospital previously because of bradycardia); not a candidate for topiramate because of history of kidney stones  ALLERGIES:   Allergies  Allergen Reactions   Beta Adrenergic Blockers     Metoprolol stopped 07/2018 due to 2nd degree type 2 AV block.    CURRENT MEDICATIONS:  Outpatient Encounter Medications as of 12/09/2021  Medication Sig   ACCU-CHEK  AVIVA PLUS test strip USE STRIP TO CHECK GLUCOSE TWICE DAILY TO THREE TIMES DAILY **NEED OFFICE VISIT FOR REFILLS**   aspirin 81 MG tablet Take 81 mg by mouth daily.   Continuous Blood Gluc Sensor (FREESTYLE LIBRE 2 SENSOR) MISC 1 Device by Does not apply route every 14 (fourteen) days.   Insulin Aspart FlexPen 100 UNIT/ML SOPN Inject 30 Units into the skin 2 (two) times daily with a meal.   insulin glargine (LANTUS SOLOSTAR) 100 UNIT/ML Solostar Pen Inject 15 Units into the skin at bedtime.   Insulin Pen Needle (BD ULTRA-FINE PEN NEEDLES) 29G X 12.7MM MISC 1 Device by Other route in the morning, at noon, in the evening, and at bedtime.   JARDIANCE 25 MG TABS tablet Take 1 tablet by mouth once daily   liraglutide (VICTOZA) 18 MG/3ML SOPN Inject 1.8 mg into the skin daily.   nitroGLYCERIN (NITROLINGUAL) 0.4 MG/SPRAY spray Place 1 spray under the tongue every 5 (five) minutes x 3 doses as needed for chest pain.   pantoprazole (PROTONIX) 40 MG tablet Take 1 tablet (40 mg total) by mouth daily.   rosuvastatin (CRESTOR) 10 MG tablet Take 1 tablet (10 mg total) by mouth daily.   ticagrelor (BRILINTA) 60 MG TABS tablet Take 1 tablet (60 mg total) by mouth 2 (two) times daily.   valsartan (DIOVAN) 160 MG tablet Take  1 tablet (160 mg total) by mouth daily.   No facility-administered encounter medications on file as of 12/09/2021.     Objective:    PHYSICAL EXAMINATION:    VITALS:   Vitals:   12/09/21 1031  BP: (!) 158/74  Pulse: 68  SpO2: 98%  Weight: 278 lb 6.4 oz (126.3 kg)  Height: 5\' 7"  (1.702 m)     GEN:  The patient appears stated age and is in NAD. HEENT:  Normocephalic, atraumatic.    Neurological examination:  Orientation: The patient is alert and oriented x3. Cranial nerves: There is good facial symmetry. The speech is fluent and clear. Soft palate rises symmetrically and there is no tongue deviation. Hearing is intact to conversational tone. Sensation: Sensation is intact to  light touch throughout Motor: Strength is at least antigravity x4.  Movement examination: Tone: There is normal tone in the bilateral upper extremities.  The tone in the lower extremities is normal.  Abnormal movements: Did not see any significant rest tremor today.  There is postural tremor on the L>R.  This is same as last visit. Coordination:  There is no decremation with RAM's, with any form of RAMS, including alternating supination and pronation of the forearm, hand opening and closing, finger taps, heel taps and toe taps. Gait and Station: The patient has no difficulty arising out of a deep-seated chair without the use of the hands. The patient's stride length is good with good arm swing bilaterally. I have reviewed and interpreted the following labs independently   Chemistry      Component Value Date/Time   NA 143 06/26/2021 0921   NA 140 08/02/2018 1208   K 3.8 06/26/2021 0921   CL 109 06/26/2021 0921   CO2 23 06/26/2021 0921   BUN 10 06/26/2021 0921   BUN 13 08/02/2018 1208   CREATININE 1.07 06/26/2021 0921      Component Value Date/Time   CALCIUM 9.4 06/26/2021 0921   ALKPHOS 86 05/10/2017 0832   AST 23 06/26/2021 0921   ALT 20 06/26/2021 0921   BILITOT 0.7 06/26/2021 0921      Lab Results  Component Value Date   WBC 7.1 06/26/2021   HGB 15.0 06/26/2021   HCT 48.0 06/26/2021   MCV 82.1 06/26/2021   PLT 356 06/26/2021   Lab Results  Component Value Date   TSH 1.867 07/30/2018     Chemistry      Component Value Date/Time   NA 143 06/26/2021 0921   NA 140 08/02/2018 1208   K 3.8 06/26/2021 0921   CL 109 06/26/2021 0921   CO2 23 06/26/2021 0921   BUN 10 06/26/2021 0921   BUN 13 08/02/2018 1208   CREATININE 1.07 06/26/2021 0921      Component Value Date/Time   CALCIUM 9.4 06/26/2021 0921   ALKPHOS 86 05/10/2017 0832   AST 23 06/26/2021 0921   ALT 20 06/26/2021 0921   BILITOT 0.7 06/26/2021 0921     Lab Results  Component Value Date   HGBA1C 8.0 (A)  08/21/2021      Cc:  08/23/2021, MD

## 2021-12-09 ENCOUNTER — Ambulatory Visit: Payer: Medicare PPO | Admitting: Neurology

## 2021-12-09 ENCOUNTER — Encounter: Payer: Self-pay | Admitting: Neurology

## 2021-12-09 ENCOUNTER — Other Ambulatory Visit: Payer: Self-pay

## 2021-12-09 VITALS — BP 158/74 | HR 68 | Ht 67.0 in | Wt 278.4 lb

## 2021-12-09 DIAGNOSIS — G250 Essential tremor: Secondary | ICD-10-CM

## 2021-12-09 NOTE — Patient Instructions (Signed)
It was good to see you!  Let me know if you need a follow up in the future!

## 2021-12-11 ENCOUNTER — Telehealth: Payer: Self-pay

## 2021-12-11 ENCOUNTER — Ambulatory Visit (HOSPITAL_BASED_OUTPATIENT_CLINIC_OR_DEPARTMENT_OTHER): Payer: Medicare PPO | Admitting: Family

## 2021-12-11 NOTE — Telephone Encounter (Signed)
Pt's spouse called to report pt has large bruise to the calf area of his left leg. She states he does not recall any specific injury. She also reports the back of his heel seems to be getting a bruised color to it as well. Pt states it does hurt some but gets better after walking. Pt is unsure if the area is warm to the touch, reports the pain does increase at night when lying down.   Pt offered appt today with Shanda Bumps but refuses. Pt states he will not see anyone but you. Pt/spouse state they were told they could just walk in anytime and see you, advised them you have a full schedule today so this would not be possible. Pt/spouse declined multiple appointment offers.   Please advise, thank you!

## 2021-12-12 NOTE — Telephone Encounter (Signed)
Per Dr Tanya Nones pt can have one of the same day slots today. Thanks!

## 2021-12-15 ENCOUNTER — Encounter (HOSPITAL_BASED_OUTPATIENT_CLINIC_OR_DEPARTMENT_OTHER): Payer: Medicare PPO | Admitting: Cardiology

## 2021-12-18 ENCOUNTER — Encounter: Payer: Self-pay | Admitting: Family Medicine

## 2021-12-18 ENCOUNTER — Other Ambulatory Visit: Payer: Self-pay

## 2021-12-18 ENCOUNTER — Ambulatory Visit: Payer: Medicare PPO | Admitting: Family Medicine

## 2021-12-18 VITALS — BP 138/62 | HR 71 | Temp 98.3°F | Resp 18 | Ht 67.0 in | Wt 278.0 lb

## 2021-12-18 DIAGNOSIS — M25562 Pain in left knee: Secondary | ICD-10-CM

## 2021-12-18 DIAGNOSIS — R2242 Localized swelling, mass and lump, left lower limb: Secondary | ICD-10-CM

## 2021-12-18 NOTE — Progress Notes (Signed)
Subjective:    Patient ID: Erik Hancock, male    DOB: 04-20-1951, 71 y.o.   MRN: [id number] 3 weeks ago, the patient [place] when a rooster ran up and collided with his posterior leg.  It hit him in the calf.  The impact was extremely painful as the animal essentially was attacking him.  He is on aspirin and Brilinta.  Therefore he bled and suffered a large hematoma.  The hematoma tracked all the way down his leg into his medial plantar arch.  That has resolved however he still has significant swelling in his left leg distal to the knee.  He also has a moderate effusion in the left knee.  He has pain with palpation over the medial and lateral joint lines.  He has pain with walking.  He has pain with sitting and standing.  He is tender to palpation of the medial and lateral joint lines.  There is no laxity to varus or valgus stress however he has significant pain in the posterior knee with Apley grind. Past Medical History:  Diagnosis Date   Arthritis    "all over" (07/29/2018)   CAD S/P percutaneous coronary angioplasty    a. stent to mLAD 11/2001. b. DES to PDA/mLCx 2007. c. DES to ostial LAD 07/2018.   Chronic lower back pain    CKD (chronic kidney disease), stage III (HCC)    CKD (chronic kidney disease), stage III (HCC)    Diabetic retinopathy (HCC)    Diabetic retinopathy (HCC)    GERD (gastroesophageal reflux disease)    High cholesterol    History of kidney stones    Hypertension    Mobitz type 2 second degree atrioventricular block    a. noted during 07/2018 adm, metoprolol discontinued.   Morbid obesity ([place]) 07/28/2018   Prostatitis    Pulmonary nodule 2013   CT   Sleep apnea    stopbang=5   Suspected sleep apnea    Type II diabetes mellitus (HCC)    Past Surgical History:  Procedure Laterality Date   CARDIAC CATHETERIZATION  JUNE 2003   PATENT LAD STENT/ BODERLINE OBSTRUCTIVE DISEASE POSTERIOR DESCENDING ARTERY/ NORMAL LVF   CATARACT EXTRACTION W/  INTRAOCULAR LENS  IMPLANT, BILATERAL Bilateral 2017   CERVICAL SCOVILLE FORAMINOTOMY W/ EXCISION OF HERNIATED NUCLEC PULPOSUS  2009   C6 - 7   CORONARY ANGIOPLASTY WITH STENT PLACEMENT  03/30/2006   DR EDMUNDS - 90% LCx@OM2  TAXUS  DES 3.0 X 20 -> 3.25 mm,   95% RPDA -- TAXUS DES 3.0 X 16 --> 3.5 mm   CORONARY ANGIOPLASTY WITH STENT PLACEMENT  11/2001   (Dr. Ilda Foil for Dr. Radford Pax) - mLAD@D2  - TAXUS EXPRESS DES 3.5 x 15    CORONARY ANGIOPLASTY WITH STENT PLACEMENT  07/29/2018   CORONARY STENT INTERVENTION N/A 07/29/2018   Procedure: CORONARY STENT INTERVENTION;  Surgeon: Leonie Man, MD;  Location: [facility];  Service: Cardiovascular;  Laterality: N/A;   CYSTOSCOPY W/ RETROGRADES  05/04/2012   Procedure: CYSTOSCOPY WITH RETROGRADE PYELOGRAM;  Surgeon: Molli Hazard, MD;  Location: [facility];  Service: Urology;  Laterality: Left;   CYSTOSCOPY W/ URETERAL STENT PLACEMENT  05/04/2012   Procedure: CYSTOSCOPY WITH STENT REPLACEMENT;  Surgeon: Molli Hazard, MD;  Location: [facility];  Service: Urology;  Laterality: Left;   CYSTOSCOPY WITH STENT PLACEMENT Left 04/04/2012   CYSTOSCOPY WITH URETEROSCOPY  05/04/2012   Procedure: CYSTOSCOPY WITH URETEROSCOPY;  Surgeon: Molli Hazard, MD;  Location: [facility];  Service: Urology;  Laterality: Left;       LEFT HEART CATH AND CORONARY ANGIOGRAPHY N/A 07/29/2018   Procedure: LEFT HEART CATH AND CORONARY ANGIOGRAPHY;  Surgeon: Leonie Man, MD;  Location: [facility];  Service: Cardiovascular;  Laterality: N/A;   LEFT URETEROSCOPIC STONE EXTRACTION  03-14-2003   X2   Current Outpatient Medications on File Prior to Visit  Medication Sig Dispense Refill   ACCU-CHEK AVIVA PLUS test strip USE STRIP TO CHECK GLUCOSE TWICE DAILY TO THREE TIMES DAILY **NEED OFFICE VISIT FOR REFILLS** 100 each 0   aspirin 81 MG tablet Take 81 mg by mouth daily.     Continuous Blood Gluc Sensor (FREESTYLE LIBRE 2 SENSOR) MISC 1 Device by Does not  apply route every 14 (fourteen) days. 6 each 3   Insulin Aspart FlexPen 100 UNIT/ML SOPN Inject 30 Units into the skin 2 (two) times daily with a meal. 60 mL 3   insulin glargine (LANTUS SOLOSTAR) 100 UNIT/ML Solostar Pen Inject 15 Units into the skin at bedtime. 30 mL 2   Insulin Pen Needle (BD ULTRA-FINE PEN NEEDLES) 29G X 12.7MM MISC 1 Device by Other route in the morning, at noon, in the evening, and at bedtime. 360 each 3   JARDIANCE 25 MG TABS tablet Take 1 tablet by mouth once daily 90 tablet 0   liraglutide (VICTOZA) 18 MG/3ML SOPN Inject 1.8 mg into the skin daily. 18 mL 3   nitroGLYCERIN (NITROLINGUAL) 0.4 MG/SPRAY spray Place 1 spray under the tongue every 5 (five) minutes x 3 doses as needed for chest pain. 12 g 12   pantoprazole (PROTONIX) 40 MG tablet Take 1 tablet (40 mg total) by mouth daily. 90 tablet 3   rosuvastatin (CRESTOR) 10 MG tablet Take 1 tablet (10 mg total) by mouth daily. 90 tablet 3   ticagrelor (BRILINTA) 60 MG TABS tablet Take 1 tablet (60 mg total) by mouth 2 (two) times daily. 180 tablet 2   valsartan (DIOVAN) 160 MG tablet Take 1 tablet (160 mg total) by mouth daily. 90 tablet 1   No current facility-administered medications on file prior to visit.   Allergies  Allergen Reactions   Beta Adrenergic Blockers     Metoprolol stopped 07/2018 due to 2nd degree type 2 AV block.   Social History   Socioeconomic History   Marital status: Married    Spouse name: Not on file   Number of children: Not on file   Years of education: Not on file   Highest education level: Not on file  Occupational History   Not on file  Tobacco Use   Smoking status: Never   Smokeless tobacco: Never  Vaping Use   Vaping Use: Never used  Substance and Sexual Activity   Alcohol use: Never   Drug use: Never   Sexual activity: Not Currently  Other Topics Concern   Not on file  Social History Narrative   Right handed    Lives with wife    Social Determinants of Health    Financial Resource Strain: Not on file  Food Insecurity: Not on file  Transportation Needs: Not on file  Physical Activity: Not on file  Stress: Not on file  Social Connections: Not on file  Intimate Partner Violence: Not on file      Review of Systems  All other systems reviewed and are negative.     Objective:    Vital signs are reviewed Physical exam today  shows swelling in the left knee with a moderate effusion.  There is no erythema or warmth but he does have pain with flexion and extension.  There is no laxity to varus or valgus stress.  However he does have pain with Apley grind.  There is tenderness to palpation over the medial and lateral joint line.  He has +1 edema in his left leg distal to the knee.  The leg is tight and I believe this is from swelling from the initial injury.  We discussed the risk of a blood clot however the patient has been ambulatory for the last 3 weeks and the swelling is gradually getting better.  I believe this is swelling due to the contusion that he suffered given the large hematoma that he had.  Lungs are clear to auscultation bilaterally.  Heart rate is regular with normal sinus rhythm.     Assessment & Plan:  Acute pain of left knee  Localized swelling of left lower leg Patient has pain in the knee.  He is unable to take NSAIDs.  Therefore I performed a cortisone injection today.  Using sterile technique, I injected the left knee with 3 cc of lidocaine, 3 cc Marcaine, and 2 cc of 40 mg/mL Kenalog.  I believe the swelling in his lower left leg is likely due to a large hematoma and contusion that he suffered.  I do not feel that he has a DVT as he has been ambulatory and on dual antiplatelet therapy.  We discussed getting an ultrasound to evaluate for DVT but both the patient and I think this is unlikely and have decided not to pursue further.  Recheck in 3 weeks if no better or sooner if worse.

## 2021-12-24 DIAGNOSIS — S83242A Other tear of medial meniscus, current injury, left knee, initial encounter: Secondary | ICD-10-CM | POA: Diagnosis not present

## 2021-12-29 NOTE — Telephone Encounter (Addendum)
Patient cancelled his study valid until 12/29/21. Valid date extended new service date Jan 07 2022 - February 06 2022.

## 2021-12-30 ENCOUNTER — Ambulatory Visit: Payer: Medicare PPO | Admitting: Family Medicine

## 2022-01-06 DIAGNOSIS — M25562 Pain in left knee: Secondary | ICD-10-CM | POA: Diagnosis not present

## 2022-01-08 ENCOUNTER — Telehealth: Payer: Self-pay | Admitting: *Deleted

## 2022-01-08 NOTE — Telephone Encounter (Signed)
Spoke with patients wife Darl Pikes, (dpr on file) to give her the information regarding cpap titration appointment and she stated that the patient has already made the office and providers aware that he does not wish to proceed with any further in-lab sleep testing or titration.

## 2022-01-09 DIAGNOSIS — M25562 Pain in left knee: Secondary | ICD-10-CM | POA: Diagnosis not present

## 2022-01-29 ENCOUNTER — Encounter (HOSPITAL_BASED_OUTPATIENT_CLINIC_OR_DEPARTMENT_OTHER): Payer: Medicare PPO | Admitting: Cardiology

## 2022-01-29 ENCOUNTER — Encounter (INDEPENDENT_AMBULATORY_CARE_PROVIDER_SITE_OTHER): Payer: Medicare PPO | Admitting: Ophthalmology

## 2022-02-02 ENCOUNTER — Telehealth (HOSPITAL_BASED_OUTPATIENT_CLINIC_OR_DEPARTMENT_OTHER): Payer: Self-pay

## 2022-02-02 DIAGNOSIS — M25562 Pain in left knee: Secondary | ICD-10-CM | POA: Diagnosis not present

## 2022-02-02 NOTE — Telephone Encounter (Signed)
? ?  Pre-operative Risk Assessment  ?  ?Patient Name: WILFREDO CANTERBURY  ?DOB: 04-21-1951 ?MRN: [id number]  ? ?  ? ?Request for Surgical Clearance   ? ?Procedure:   Left Partial Knee Replacment ? ?Date of Surgery:  Clearance TBD                              ?   ?Surgeon:  Margarita Rana, MD  ?Surgeon's Group or Practice Name:  [facility]  ?Phone number:  [phone]. [phone] ?Fax number:  [phone] ?  ?Type of Clearance Requested:   ?- Pharmacy:  Hold Aspirin and Ticagrelor (Brilinta) clearance has also been requested from Endo and PCP  ?  ?Type of Anesthesia:  Spinal ?  ?Additional requests/questions:   ?Signed, ?Marlene Lard   ?02/02/2022, 1:44 PM  ? ?

## 2022-02-02 NOTE — Telephone Encounter (Signed)
Dr. Duke Salvia to review.  Her last PCI was in 2019.  Recent Myoview in November 2022 was low risk.  From your standpoint, can the patient hold aspirin and Plavix prior to partial knee replacement? ? ?Please forward your response to P [facility] PREOP ?

## 2022-02-03 ENCOUNTER — Other Ambulatory Visit: Payer: Self-pay

## 2022-02-03 ENCOUNTER — Telehealth: Payer: Self-pay | Admitting: Endocrinology

## 2022-02-03 ENCOUNTER — Encounter (INDEPENDENT_AMBULATORY_CARE_PROVIDER_SITE_OTHER): Payer: Medicare PPO | Admitting: Ophthalmology

## 2022-02-03 DIAGNOSIS — E113313 Type 2 diabetes mellitus with moderate nonproliferative diabetic retinopathy with macular edema, bilateral: Secondary | ICD-10-CM

## 2022-02-03 DIAGNOSIS — H35033 Hypertensive retinopathy, bilateral: Secondary | ICD-10-CM | POA: Diagnosis not present

## 2022-02-03 DIAGNOSIS — H43813 Vitreous degeneration, bilateral: Secondary | ICD-10-CM | POA: Diagnosis not present

## 2022-02-03 DIAGNOSIS — I10 Essential (primary) hypertension: Secondary | ICD-10-CM

## 2022-02-03 NOTE — Telephone Encounter (Signed)
please contact patient: ?F/u ov is due next avail ?

## 2022-02-05 NOTE — Telephone Encounter (Signed)
Pre-op covering staff, patient will need a telehealth visit to complete pre-op evaluation. Can you please call and consent patient for this and add to an APP pre-op schedule? ? ?Per Dr. Duke Salvia, OK to hold Aspirin and Plavix for surgery. ? ?Thank you! ?Corrin Parker, PA-C ?02/05/2022 12:51 PM ? ?

## 2022-02-05 NOTE — Telephone Encounter (Signed)
Left message for the pt to call back and ask to s/w the pre op team to set up a tele visit.  ?

## 2022-02-06 ENCOUNTER — Telehealth: Payer: Self-pay | Admitting: *Deleted

## 2022-02-06 NOTE — Telephone Encounter (Signed)
?  Patient Consent for Virtual Visit  ? ? ?   ? ?Erik Hancock has provided verbal consent on 02/06/2022 for a virtual visit (video or telephone). ? ? ?CONSENT FOR VIRTUAL VISIT FOR:  Erik Hancock  ?By participating in this virtual visit I agree to the following: ? ?I hereby voluntarily request, consent and authorize [facility] and its employed or contracted physicians, physician assistants, nurse practitioners or other licensed health care professionals (the Practitioner), to provide me with telemedicine health care services (the ?Services") as deemed necessary by the treating Practitioner. I acknowledge and consent to receive the Services by the Practitioner via telemedicine. I understand that the telemedicine visit will involve communicating with the Practitioner through live audiovisual communication technology and the disclosure of certain medical information by electronic transmission. I acknowledge that I have been given the opportunity to request an in-person assessment or other available alternative prior to the telemedicine visit and am voluntarily participating in the telemedicine visit. ? ?I understand that I have the right to withhold or withdraw my consent to the use of telemedicine in the course of my care at any time, without affecting my right to future care or treatment, and that the Practitioner or I may terminate the telemedicine visit at any time. I understand that I have the right to inspect all information obtained and/or recorded in the course of the telemedicine visit and may receive copies of available information for a reasonable fee.  I understand that some of the potential risks of receiving the Services via telemedicine include:  ?Delay or interruption in medical evaluation due to technological equipment failure or disruption; ?Information transmitted may not be sufficient (e.g. poor resolution of images) to allow for appropriate medical decision making by the Practitioner; and/or   ?In rare instances, security protocols could fail, causing a breach of personal health information. ? ?Furthermore, I acknowledge that it is my responsibility to provide information about my medical history, conditions and care that is complete and accurate to the best of my ability. I acknowledge that Practitioner's advice, recommendations, and/or decision may be based on factors not within their control, such as incomplete or inaccurate data provided by me or distortions of diagnostic images or specimens that may result from electronic transmissions. I understand that the practice of medicine is not an exact science and that Practitioner makes no warranties or guarantees regarding treatment outcomes. I acknowledge that a copy of this consent can be made available to me via my patient portal [facility] MyChart), or I can request a printed copy by calling the office of [facility].   ? ?I understand that my insurance will be billed for this visit.  ? ?I have read or had this consent read to me. ?I understand the contents of this consent, which adequately explains the benefits and risks of the Services being provided via telemedicine.  ?I have been provided ample opportunity to ask questions regarding this consent and the Services and have had my questions answered to my satisfaction. ?I give my informed consent for the services to be provided through the use of telemedicine in my medical care ? ? ? ?

## 2022-02-06 NOTE — Telephone Encounter (Signed)
S/w DPR pt's wife. Set up tele pre op appt for 02/09/22 11 am. Med rec and consent are done.  ?

## 2022-02-09 ENCOUNTER — Encounter: Payer: Self-pay | Admitting: Physician Assistant

## 2022-02-09 ENCOUNTER — Ambulatory Visit (INDEPENDENT_AMBULATORY_CARE_PROVIDER_SITE_OTHER): Payer: Medicare PPO | Admitting: Physician Assistant

## 2022-02-09 ENCOUNTER — Other Ambulatory Visit: Payer: Self-pay

## 2022-02-09 DIAGNOSIS — Z01810 Encounter for preprocedural cardiovascular examination: Secondary | ICD-10-CM | POA: Diagnosis not present

## 2022-02-09 NOTE — Progress Notes (Signed)
? ?Virtual Visit via Telephone Note  ? ?This visit type was conducted due to national recommendations for restrictions regarding the COVID-19 Pandemic (e.g. social distancing) in an effort to limit this patient's exposure and mitigate transmission in our community.  Due to his co-morbid illnesses, this patient is at least at moderate risk for complications without adequate follow up.  This format is felt to be most appropriate for this patient at this time.  The patient did not have access to video technology/had technical difficulties with video requiring transitioning to audio format only (telephone).  All issues noted in this document were discussed and addressed.  No physical exam could be performed with this format.  Please refer to the patient's chart for his  consent to telehealth for [facility]. ?Evaluation Performed:  Preoperative cardiovascular risk assessment ? ?This visit type was conducted due to national recommendations for restrictions regarding the COVID-19 Pandemic (e.g. social distancing).  This format is felt to be most appropriate for this patient at this time.  All issues noted in this document were discussed and addressed.  No physical exam was performed (except for noted visual exam findings with Video Visits).  Please refer to the patient's chart (MyChart message for video visits and phone note for telephone visits) for the patient's consent to telehealth for [facility]. ?_____________  ? ?Date:  02/09/2022  ? ?Patient ID:  Erik Hancock, DOB 03/06/1951, MRN [id number] ?Patient Location:  ?Home ?Provider location:   ?Office ? ?Primary Care Provider:  [name], MD ?Primary Cardiologist:  Skeet Latch, MD ? ?Chief Complaint  ?  ?71 y.o. y/o male with a h/o , who is pending CAD s/p PCI, OSA on CPAP, DM, HTN, and HLD, and presents today for telephonic preoperative cardiovascular risk assessment for left partial knee replacement. ? ?Past Medical History  ?  ?Past Medical  History:  ?Diagnosis Date  ? Arthritis   ? "all over" (07/29/2018)  ? CAD S/P percutaneous coronary angioplasty   ? a. stent to mLAD 11/2001. b. DES to PDA/mLCx 2007. c. DES to ostial LAD 07/2018.  ? Chronic lower back pain   ? CKD (chronic kidney disease), stage III ([place])   ? CKD (chronic kidney disease), stage III ([place])   ? Diabetic retinopathy ([place])   ? Diabetic retinopathy ([place])   ? GERD (gastroesophageal reflux disease)   ? High cholesterol   ? History of kidney stones   ? Hypertension   ? Mobitz type 2 second degree atrioventricular block   ? a. noted during 07/2018 adm, metoprolol discontinued.  ? Morbid obesity ([place]) 07/28/2018  ? Prostatitis   ? Pulmonary nodule 2013  ? CT  ? Sleep apnea   ? stopbang=5  ? Suspected sleep apnea   ? Type II diabetes mellitus ([place])   ? ?Past Surgical History:  ?Procedure Laterality Date  ? CARDIAC CATHETERIZATION  JUNE 2003  ? PATENT LAD STENT/ BODERLINE OBSTRUCTIVE DISEASE POSTERIOR DESCENDING ARTERY/ NORMAL LVF  ? CATARACT EXTRACTION W/ INTRAOCULAR LENS  IMPLANT, BILATERAL Bilateral 2017  ? CERVICAL SCOVILLE FORAMINOTOMY W/ EXCISION OF HERNIATED [place] PULPOSUS  2009  ? C6 - 7  ? CORONARY ANGIOPLASTY WITH STENT PLACEMENT  03/30/2006  ? DR EDMUNDS - 90% LCx@OM2  TAXUS  DES 3.0 X 20 -> 3.25 mm,   95% RPDA -- TAXUS DES 3.0 X 16 --> 3.5 mm  ? CORONARY ANGIOPLASTY WITH STENT PLACEMENT  11/2001  ? (Dr. Ilda Foil for Dr. Radford Pax) - mLAD@D2  - TAXUS EXPRESS DES 3.5  x 15   ? CORONARY ANGIOPLASTY WITH STENT PLACEMENT  07/29/2018  ? CORONARY STENT INTERVENTION N/A 07/29/2018  ? Procedure: CORONARY STENT INTERVENTION;  Surgeon: Leonie Man, MD;  Location: [place] [facility];  Service: Cardiovascular;  Laterality: N/A;  ? CYSTOSCOPY W/ RETROGRADES  05/04/2012  ? Procedure: CYSTOSCOPY WITH RETROGRADE PYELOGRAM;  Surgeon: Molli Hazard, MD;  Location: [facility];  Service: Urology;  Laterality: Left;  ? CYSTOSCOPY W/ URETERAL STENT PLACEMENT  05/04/2012  ? Procedure: CYSTOSCOPY WITH STENT  REPLACEMENT;  Surgeon: Molli Hazard, MD;  Location: [facility];  Service: Urology;  Laterality: Left;  ? CYSTOSCOPY WITH STENT PLACEMENT Left 04/04/2012  ? CYSTOSCOPY WITH URETEROSCOPY  05/04/2012  ? Procedure: CYSTOSCOPY WITH URETEROSCOPY;  Surgeon: Molli Hazard, MD;  Location: [facility];  Service: Urology;  Laterality: Left;   ? ?  ? LEFT HEART CATH AND CORONARY ANGIOGRAPHY N/A 07/29/2018  ? Procedure: LEFT HEART CATH AND CORONARY ANGIOGRAPHY;  Surgeon: Leonie Man, MD;  Location: [place] [facility];  Service: Cardiovascular;  Laterality: N/A;  ? LEFT URETEROSCOPIC STONE EXTRACTION  03-14-2003  ? X2  ? ? ?Allergies ? ?Allergies  ?Allergen Reactions  ? Beta Adrenergic Blockers   ?  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  ? ? ?History of Present Illness  ?  ?Erik Hancock is a 71 y.o. male who presents via audio/video conferencing for a telehealth visit today.  Pt was last seen in cardiology clinic on 09/02/21, by Dr. Oval Linsey.  At that time Erik Hancock was doing well.  he is now pending left partial knee replacement.  Since his last visit, he in considerable pain but can still walk [place] to [place]. He can climb stairs, walk up hills, and grocery shops without angina. He had a reassuring stress test 09/2021. No cardiac complaints. ? ?Home Medications  ?  ?Prior to Admission medications   ?Medication Sig Start Date End Date Taking? Authorizing Provider  ?ACCU-CHEK AVIVA PLUS test strip USE STRIP TO CHECK GLUCOSE TWICE DAILY TO THREE TIMES DAILY **NEED OFFICE VISIT FOR REFILLS** 12/02/20   [name], MD  ?aspirin 81 MG tablet Take 81 mg by mouth daily.    [provider]  ?Continuous Blood Gluc Sensor (FREESTYLE LIBRE 2 SENSOR) MISC 1 Device by Does not apply route every 14 (fourteen) days. 08/21/21   Renato Shin, MD  ?Insulin Aspart FlexPen 100 UNIT/ML SOPN Inject 30 Units into the skin 2 (two) times daily with a meal. 05/20/21   Renato Shin, MD   ?insulin glargine (LANTUS SOLOSTAR) 100 UNIT/ML Solostar Pen Inject 15 Units into the skin at bedtime. 08/21/21   Renato Shin, MD  ?Insulin Pen Needle (BD ULTRA-FINE PEN NEEDLES) 29G X 12.7MM MISC 1 Device by Other route in the morning, at noon, in the evening, and at bedtime. 06/17/20   Renato Shin, MD  ?JARDIANCE 25 MG TABS tablet Take 1 tablet by mouth once daily 10/09/21   [name], MD  ?liraglutide (VICTOZA) 18 MG/3ML SOPN Inject 1.8 mg into the skin daily. 10/06/21   Renato Shin, MD  ?nitroGLYCERIN (NITROLINGUAL) 0.4 MG/SPRAY spray Place 1 spray under the tongue every 5 (five) minutes x 3 doses as needed for chest pain. 03/07/20   Skeet Latch, MD  ?pantoprazole (PROTONIX) 40 MG tablet Take 1 tablet (40 mg total) by mouth daily. 12/01/21   [name], MD  ?rosuvastatin (CRESTOR) 10 MG  tablet Take 1 tablet (10 mg total) by mouth daily. 06/26/21   [name], MD  ?ticagrelor (BRILINTA) 60 MG TABS tablet Take 1 tablet (60 mg total) by mouth 2 (two) times daily. 10/09/21   Skeet Latch, MD  ?valsartan (DIOVAN) 160 MG tablet Take 1 tablet (160 mg total) by mouth daily. 11/06/21   Skeet Latch, MD  ? ? ?Physical Exam  ?  ?Vital Signs:  Erik Hancock does not have vital signs available for review today. ? ?Given telephonic nature of communication, physical exam is limited. ?AAOx3. NAD. Normal affect.  Speech and respirations are unlabored. ? ?Accessory Clinical Findings  ?  ?None ? ?Assessment & Plan  ?  ?1.  Preoperative Cardiovascular Risk Assessment: ? ?Pt has a history of ischemic heart disease with prior PCI.  He is able to complete 4.0 METS without angina.  He is mainly limited by knee pain.  He had a reassuring stress test in November 2022.  According to the RCRI, he has a 6.6% risk of Mace.  Given his recent stress test and lack of symptoms, I do not think that additional testing would mitigate his risk. ? ?Therefore, based on ACC/AHA guidelines, the patient would be at  acceptable risk for the planned procedure without further cardiovascular testing.  ? ?The patient was advised that if he develops new symptoms prior to surgery to contact our office to arrange for a follow-up visi

## 2022-02-10 ENCOUNTER — Ambulatory Visit: Payer: Medicare PPO | Admitting: Endocrinology

## 2022-02-10 ENCOUNTER — Other Ambulatory Visit: Payer: Self-pay | Admitting: Family Medicine

## 2022-02-11 ENCOUNTER — Other Ambulatory Visit: Payer: Self-pay

## 2022-02-11 ENCOUNTER — Emergency Department (HOSPITAL_COMMUNITY): Payer: Medicare PPO | Admitting: Anesthesiology

## 2022-02-11 ENCOUNTER — Ambulatory Visit: Payer: Medicare PPO | Admitting: Endocrinology

## 2022-02-11 ENCOUNTER — Inpatient Hospital Stay (HOSPITAL_COMMUNITY)
Admission: EM | Admit: 2022-02-11 | Discharge: 2022-02-17 | DRG: 854 | Disposition: A | Discharge status: Home / Self Care | Payer: Medicare PPO | Attending: Family Medicine | Admitting: Family Medicine

## 2022-02-11 ENCOUNTER — Encounter (HOSPITAL_COMMUNITY)
Admission: EM | Disposition: A | Discharge status: Home / Self Care | Payer: Self-pay | Source: Home / Self Care | Attending: Family Medicine

## 2022-02-11 ENCOUNTER — Ambulatory Visit
Admission: EM | Admit: 2022-02-11 | Discharge: 2022-02-11 | Disposition: A | Discharge status: Home / Self Care | Payer: Medicare PPO | Attending: Urgent Care | Admitting: Urgent Care

## 2022-02-11 ENCOUNTER — Encounter (HOSPITAL_COMMUNITY): Payer: Self-pay

## 2022-02-11 ENCOUNTER — Ambulatory Visit (INDEPENDENT_AMBULATORY_CARE_PROVIDER_SITE_OTHER): Payer: Medicare PPO

## 2022-02-11 DIAGNOSIS — E1165 Type 2 diabetes mellitus with hyperglycemia: Secondary | ICD-10-CM | POA: Diagnosis not present

## 2022-02-11 DIAGNOSIS — I10 Essential (primary) hypertension: Secondary | ICD-10-CM | POA: Diagnosis not present

## 2022-02-11 DIAGNOSIS — G4733 Obstructive sleep apnea (adult) (pediatric): Secondary | ICD-10-CM | POA: Diagnosis present

## 2022-02-11 DIAGNOSIS — M65841 Other synovitis and tenosynovitis, right hand: Secondary | ICD-10-CM | POA: Diagnosis not present

## 2022-02-11 DIAGNOSIS — E8729 Other acidosis: Secondary | ICD-10-CM | POA: Diagnosis not present

## 2022-02-11 DIAGNOSIS — L03011 Cellulitis of right finger: Secondary | ICD-10-CM

## 2022-02-11 DIAGNOSIS — L089 Local infection of the skin and subcutaneous tissue, unspecified: Secondary | ICD-10-CM

## 2022-02-11 DIAGNOSIS — Z833 Family history of diabetes mellitus: Secondary | ICD-10-CM | POA: Diagnosis not present

## 2022-02-11 DIAGNOSIS — M79641 Pain in right hand: Secondary | ICD-10-CM

## 2022-02-11 DIAGNOSIS — K219 Gastro-esophageal reflux disease without esophagitis: Secondary | ICD-10-CM | POA: Diagnosis not present

## 2022-02-11 DIAGNOSIS — Z8249 Family history of ischemic heart disease and other diseases of the circulatory system: Secondary | ICD-10-CM | POA: Diagnosis not present

## 2022-02-11 DIAGNOSIS — Z794 Long term (current) use of insulin: Secondary | ICD-10-CM

## 2022-02-11 DIAGNOSIS — D72829 Elevated white blood cell count, unspecified: Secondary | ICD-10-CM

## 2022-02-11 DIAGNOSIS — L02511 Cutaneous abscess of right hand: Secondary | ICD-10-CM | POA: Diagnosis present

## 2022-02-11 DIAGNOSIS — I519 Heart disease, unspecified: Secondary | ICD-10-CM

## 2022-02-11 DIAGNOSIS — M19041 Primary osteoarthritis, right hand: Secondary | ICD-10-CM | POA: Diagnosis not present

## 2022-02-11 DIAGNOSIS — E11319 Type 2 diabetes mellitus with unspecified diabetic retinopathy without macular edema: Secondary | ICD-10-CM | POA: Diagnosis present

## 2022-02-11 DIAGNOSIS — M659 Synovitis and tenosynovitis, unspecified: Secondary | ICD-10-CM | POA: Diagnosis not present

## 2022-02-11 DIAGNOSIS — T383X5A Adverse effect of insulin and oral hypoglycemic [antidiabetic] drugs, initial encounter: Secondary | ICD-10-CM | POA: Diagnosis present

## 2022-02-11 DIAGNOSIS — I129 Hypertensive chronic kidney disease with stage 1 through stage 4 chronic kidney disease, or unspecified chronic kidney disease: Secondary | ICD-10-CM | POA: Diagnosis present

## 2022-02-11 DIAGNOSIS — I739 Peripheral vascular disease, unspecified: Secondary | ICD-10-CM | POA: Diagnosis not present

## 2022-02-11 DIAGNOSIS — M65141 Other infective (teno)synovitis, right hand: Secondary | ICD-10-CM | POA: Diagnosis present

## 2022-02-11 DIAGNOSIS — E785 Hyperlipidemia, unspecified: Secondary | ICD-10-CM | POA: Diagnosis not present

## 2022-02-11 DIAGNOSIS — E119 Type 2 diabetes mellitus without complications: Secondary | ICD-10-CM | POA: Diagnosis not present

## 2022-02-11 DIAGNOSIS — E11628 Type 2 diabetes mellitus with other skin complications: Secondary | ICD-10-CM

## 2022-02-11 DIAGNOSIS — Z7902 Long term (current) use of antithrombotics/antiplatelets: Secondary | ICD-10-CM

## 2022-02-11 DIAGNOSIS — R600 Localized edema: Secondary | ICD-10-CM | POA: Diagnosis not present

## 2022-02-11 DIAGNOSIS — Z9861 Coronary angioplasty status: Secondary | ICD-10-CM

## 2022-02-11 DIAGNOSIS — E1159 Type 2 diabetes mellitus with other circulatory complications: Secondary | ICD-10-CM

## 2022-02-11 DIAGNOSIS — Z79899 Other long term (current) drug therapy: Secondary | ICD-10-CM | POA: Diagnosis not present

## 2022-02-11 DIAGNOSIS — N179 Acute kidney failure, unspecified: Secondary | ICD-10-CM

## 2022-02-11 DIAGNOSIS — R6510 Systemic inflammatory response syndrome (SIRS) of non-infectious origin without acute organ dysfunction: Secondary | ICD-10-CM | POA: Diagnosis present

## 2022-02-11 DIAGNOSIS — Z955 Presence of coronary angioplasty implant and graft: Secondary | ICD-10-CM

## 2022-02-11 DIAGNOSIS — Z888 Allergy status to other drugs, medicaments and biological substances status: Secondary | ICD-10-CM

## 2022-02-11 DIAGNOSIS — Z96652 Presence of left artificial knee joint: Secondary | ICD-10-CM | POA: Diagnosis present

## 2022-02-11 DIAGNOSIS — M1811 Unilateral primary osteoarthritis of first carpometacarpal joint, right hand: Secondary | ICD-10-CM | POA: Diagnosis not present

## 2022-02-11 DIAGNOSIS — N1831 Chronic kidney disease, stage 3a: Secondary | ICD-10-CM | POA: Diagnosis present

## 2022-02-11 DIAGNOSIS — A400 Sepsis due to streptococcus, group A: Secondary | ICD-10-CM | POA: Diagnosis not present

## 2022-02-11 DIAGNOSIS — M65831 Other synovitis and tenosynovitis, right forearm: Secondary | ICD-10-CM | POA: Diagnosis not present

## 2022-02-11 DIAGNOSIS — E1152 Type 2 diabetes mellitus with diabetic peripheral angiopathy with gangrene: Secondary | ICD-10-CM | POA: Diagnosis present

## 2022-02-11 DIAGNOSIS — Z7985 Long-term (current) use of injectable non-insulin antidiabetic drugs: Secondary | ICD-10-CM

## 2022-02-11 DIAGNOSIS — E7800 Pure hypercholesterolemia, unspecified: Secondary | ICD-10-CM | POA: Diagnosis present

## 2022-02-11 DIAGNOSIS — S6991XA Unspecified injury of right wrist, hand and finger(s), initial encounter: Principal | ICD-10-CM

## 2022-02-11 DIAGNOSIS — Z23 Encounter for immunization: Secondary | ICD-10-CM

## 2022-02-11 DIAGNOSIS — Z6841 Body Mass Index (BMI) 40.0 and over, adult: Secondary | ICD-10-CM

## 2022-02-11 DIAGNOSIS — Z7984 Long term (current) use of oral hypoglycemic drugs: Secondary | ICD-10-CM

## 2022-02-11 DIAGNOSIS — E1122 Type 2 diabetes mellitus with diabetic chronic kidney disease: Secondary | ICD-10-CM | POA: Diagnosis present

## 2022-02-11 DIAGNOSIS — I2510 Atherosclerotic heart disease of native coronary artery without angina pectoris: Secondary | ICD-10-CM | POA: Diagnosis present

## 2022-02-11 DIAGNOSIS — E1110 Type 2 diabetes mellitus with ketoacidosis without coma: Secondary | ICD-10-CM | POA: Diagnosis not present

## 2022-02-11 DIAGNOSIS — L03113 Cellulitis of right upper limb: Secondary | ICD-10-CM | POA: Diagnosis not present

## 2022-02-11 DIAGNOSIS — M7989 Other specified soft tissue disorders: Secondary | ICD-10-CM

## 2022-02-11 DIAGNOSIS — Z7982 Long term (current) use of aspirin: Secondary | ICD-10-CM

## 2022-02-11 DIAGNOSIS — S61431A Puncture wound without foreign body of right hand, initial encounter: Secondary | ICD-10-CM | POA: Diagnosis not present

## 2022-02-11 DIAGNOSIS — I25119 Atherosclerotic heart disease of native coronary artery with unspecified angina pectoris: Secondary | ICD-10-CM | POA: Diagnosis not present

## 2022-02-11 DIAGNOSIS — R7881 Bacteremia: Secondary | ICD-10-CM

## 2022-02-11 DIAGNOSIS — M65949 Unspecified synovitis and tenosynovitis, unspecified hand: Secondary | ICD-10-CM | POA: Diagnosis present

## 2022-02-11 HISTORY — PX: I & D EXTREMITY: SHX5045

## 2022-02-11 LAB — CBC WITH DIFFERENTIAL/PLATELET
Abs Immature Granulocytes: 0.14 10*3/uL — ABNORMAL HIGH (ref 0.00–0.07)
Basophils Absolute: 0 10*3/uL (ref 0.0–0.1)
Basophils Relative: 0 %
Eosinophils Absolute: 0 10*3/uL (ref 0.0–0.5)
Eosinophils Relative: 0 %
HCT: 44.8 % (ref 39.0–52.0)
Hemoglobin: 14.1 g/dL (ref 13.0–17.0)
Immature Granulocytes: 1 %
Lymphocytes Relative: 2 %
Lymphs Abs: 0.4 10*3/uL — ABNORMAL LOW (ref 0.7–4.0)
MCH: 28.5 pg (ref 26.0–34.0)
MCHC: 31.5 g/dL (ref 30.0–36.0)
MCV: 90.5 fL (ref 80.0–100.0)
Monocytes Absolute: 0.8 10*3/uL (ref 0.1–1.0)
Monocytes Relative: 4 %
Neutro Abs: 17.9 10*3/uL — ABNORMAL HIGH (ref 1.7–7.7)
Neutrophils Relative %: 93 %
Platelets: 271 10*3/uL (ref 150–400)
RBC: 4.95 MIL/uL (ref 4.22–5.81)
RDW: 15.6 % — ABNORMAL HIGH (ref 11.5–15.5)
WBC: 19.3 10*3/uL — ABNORMAL HIGH (ref 4.0–10.5)
nRBC: 0 % (ref 0.0–0.2)

## 2022-02-11 LAB — COMPREHENSIVE METABOLIC PANEL
ALT: 26 U/L (ref 0–44)
AST: 23 U/L (ref 15–41)
Albumin: 3.4 g/dL — ABNORMAL LOW (ref 3.5–5.0)
Alkaline Phosphatase: 93 U/L (ref 38–126)
Anion gap: 20 — ABNORMAL HIGH (ref 5–15)
BUN: 14 mg/dL (ref 8–23)
CO2: 12 mmol/L — ABNORMAL LOW (ref 22–32)
Calcium: 9.1 mg/dL (ref 8.9–10.3)
Chloride: 103 mmol/L (ref 98–111)
Creatinine, Ser: 1.61 mg/dL — ABNORMAL HIGH (ref 0.61–1.24)
GFR, Estimated: 46 mL/min — ABNORMAL LOW (ref >60)
Glucose, Bld: 213 mg/dL — ABNORMAL HIGH (ref 70–99)
Potassium: 4.2 mmol/L (ref 3.5–5.1)
Sodium: 135 mmol/L (ref 135–145)
Total Bilirubin: 1.9 mg/dL — ABNORMAL HIGH (ref 0.3–1.2)
Total Protein: 7.3 g/dL (ref 6.5–8.1)

## 2022-02-11 LAB — GLUCOSE, CAPILLARY
Glucose-Capillary: 237 mg/dL — ABNORMAL HIGH (ref 70–99)
Glucose-Capillary: 183 mg/dL — ABNORMAL HIGH (ref 70–99)
Glucose-Capillary: 185 mg/dL — ABNORMAL HIGH (ref 70–99)
Glucose-Capillary: 208 mg/dL — ABNORMAL HIGH (ref 70–99)

## 2022-02-11 LAB — CBG MONITORING, ED: Glucose-Capillary: 187 mg/dL — ABNORMAL HIGH (ref 70–99)

## 2022-02-11 LAB — LACTIC ACID, PLASMA: Lactic Acid, Venous: 1.6 mmol/L (ref 0.5–1.9)

## 2022-02-11 LAB — POCT FASTING CBG KUC MANUAL ENTRY: POCT Glucose (KUC): 207 mg/dL — AB (ref 70–99)

## 2022-02-11 LAB — HEMOGLOBIN A1C
Hgb A1c MFr Bld: 8.4 % — ABNORMAL HIGH (ref 4.8–5.6)
Mean Plasma Glucose: 194.38 mg/dL

## 2022-02-11 SURGERY — IRRIGATION AND DEBRIDEMENT EXTREMITY
Anesthesia: General | Laterality: Right

## 2022-02-11 MED ORDER — HYDROMORPHONE HCL 1 MG/ML IJ SOLN
INTRAMUSCULAR | Status: AC
  Filled 2022-02-11: qty 1

## 2022-02-11 MED ORDER — DEXAMETHASONE SODIUM PHOSPHATE 10 MG/ML IJ SOLN
INTRAMUSCULAR | Status: DC | PRN
  Administered 2022-02-11: 4 mg via INTRAVENOUS

## 2022-02-11 MED ORDER — LACTATED RINGERS IV BOLUS
1000.0000 mL | Freq: Once | INTRAVENOUS | Status: AC
  Administered 2022-02-11: 1000 mL via INTRAVENOUS

## 2022-02-11 MED ORDER — ENOXAPARIN SODIUM 40 MG/0.4ML IJ SOSY
40.0000 mg | PREFILLED_SYRINGE | INTRAMUSCULAR | Status: DC
  Administered 2022-02-12 – 2022-02-17 (×6): 40 mg via SUBCUTANEOUS
  Filled 2022-02-11 (×6): qty 0.4

## 2022-02-11 MED ORDER — VANCOMYCIN HCL IN DEXTROSE 1-5 GM/200ML-% IV SOLN
1000.0000 mg | Freq: Once | INTRAVENOUS | Status: DC
Start: 2022-02-11 — End: 2022-02-11
  Filled 2022-02-11: qty 200

## 2022-02-11 MED ORDER — ROCURONIUM BROMIDE 10 MG/ML (PF) SYRINGE
PREFILLED_SYRINGE | INTRAVENOUS | Status: AC
  Filled 2022-02-11: qty 10

## 2022-02-11 MED ORDER — ASPIRIN 81 MG PO CHEW
81.0000 mg | CHEWABLE_TABLET | Freq: Every day | ORAL | Status: DC
  Administered 2022-02-11 – 2022-02-17 (×7): 81 mg via ORAL
  Filled 2022-02-11 (×7): qty 1

## 2022-02-11 MED ORDER — SUCCINYLCHOLINE CHLORIDE 200 MG/10ML IV SOSY
PREFILLED_SYRINGE | INTRAVENOUS | Status: DC | PRN
  Administered 2022-02-11: 120 mg via INTRAVENOUS

## 2022-02-11 MED ORDER — CHLORHEXIDINE GLUCONATE 4 % EX LIQD: 60.0000 mL | Freq: Once | CUTANEOUS | Status: DC

## 2022-02-11 MED ORDER — VANCOMYCIN HCL 10 G IV SOLR
2250.0000 mg | INTRAVENOUS | Status: DC
  Administered 2022-02-11: 2250 mg via INTRAVENOUS
  Filled 2022-02-11: qty 22.5

## 2022-02-11 MED ORDER — INSULIN ASPART 100 UNIT/ML IJ SOLN
0.0000 [IU] | INTRAMUSCULAR | Status: AC | PRN
  Administered 2022-02-11: 5 [IU] via SUBCUTANEOUS

## 2022-02-11 MED ORDER — VASOPRESSIN 20 UNIT/ML IV SOLN
INTRAVENOUS | Status: AC
  Filled 2022-02-11: qty 1

## 2022-02-11 MED ORDER — INSULIN ASPART 100 UNIT/ML IJ SOLN
INTRAMUSCULAR | Status: AC
  Administered 2022-02-11: 2 [IU] via SUBCUTANEOUS
  Filled 2022-02-11: qty 1

## 2022-02-11 MED ORDER — OXYCODONE-ACETAMINOPHEN 5-325 MG PO TABS
1.0000 | ORAL_TABLET | Freq: Once | ORAL | Status: AC
  Administered 2022-02-11: 1 via ORAL
  Filled 2022-02-11: qty 1

## 2022-02-11 MED ORDER — SODIUM CHLORIDE 0.9 % IV SOLN
3.0000 g | Freq: Four times a day (QID) | INTRAVENOUS | Status: DC
  Administered 2022-02-11 – 2022-02-12 (×3): 3 g via INTRAVENOUS
  Filled 2022-02-11 (×4): qty 8

## 2022-02-11 MED ORDER — HYDROMORPHONE HCL 1 MG/ML IJ SOLN
0.2500 mg | INTRAMUSCULAR | Status: DC | PRN
  Administered 2022-02-11 (×4): 0.5 mg via INTRAVENOUS

## 2022-02-11 MED ORDER — FENTANYL CITRATE (PF) 250 MCG/5ML IJ SOLN
INTRAMUSCULAR | Status: AC
  Filled 2022-02-11: qty 5

## 2022-02-11 MED ORDER — ALBUMIN HUMAN 5 % IV SOLN: INTRAVENOUS | Status: DC | PRN

## 2022-02-11 MED ORDER — INSULIN GLARGINE 100 UNIT/ML SOLOSTAR PEN: 15.0000 [IU] | PEN_INJECTOR | Freq: Every day | SUBCUTANEOUS | Status: DC

## 2022-02-11 MED ORDER — SODIUM CHLORIDE 0.9 % IV SOLN: INTRAVENOUS | Status: DC

## 2022-02-11 MED ORDER — TETANUS-DIPHTH-ACELL PERTUSSIS 5-2.5-18.5 LF-MCG/0.5 IM SUSY
0.5000 mL | PREFILLED_SYRINGE | Freq: Once | INTRAMUSCULAR | Status: AC
  Administered 2022-02-11: 0.5 mL via INTRAMUSCULAR
  Filled 2022-02-11: qty 0.5

## 2022-02-11 MED ORDER — FENTANYL CITRATE PF 50 MCG/ML IJ SOSY
25.0000 ug | PREFILLED_SYRINGE | INTRAMUSCULAR | Status: DC | PRN
Start: 2022-02-11 — End: 2022-02-17
  Administered 2022-02-13 – 2022-02-16 (×12): 25 ug via INTRAVENOUS
  Filled 2022-02-11 (×13): qty 1

## 2022-02-11 MED ORDER — ONDANSETRON HCL 4 MG/2ML IJ SOLN
4.0000 mg | Freq: Once | INTRAMUSCULAR | Status: AC
  Administered 2022-02-11: 4 mg via INTRAVENOUS
  Filled 2022-02-11: qty 2

## 2022-02-11 MED ORDER — INSULIN ASPART 100 UNIT/ML IJ SOLN
5.0000 [IU] | Freq: Once | INTRAMUSCULAR | Status: DC
Start: 2022-02-11 — End: 2022-02-11

## 2022-02-11 MED ORDER — VANCOMYCIN HCL 2000 MG/400ML IV SOLN
2000.0000 mg | Freq: Once | INTRAVENOUS | Status: DC
  Filled 2022-02-11: qty 400

## 2022-02-11 MED ORDER — ACETAMINOPHEN 650 MG RE SUPP
650.0000 mg | Freq: Four times a day (QID) | RECTAL | Status: DC | PRN
Start: 2022-02-11 — End: 2022-02-17

## 2022-02-11 MED ORDER — PANTOPRAZOLE SODIUM 40 MG PO TBEC
40.0000 mg | DELAYED_RELEASE_TABLET | Freq: Every day | ORAL | Status: DC
  Administered 2022-02-11 – 2022-02-17 (×7): 40 mg via ORAL
  Filled 2022-02-11 (×7): qty 1

## 2022-02-11 MED ORDER — EMPAGLIFLOZIN 25 MG PO TABS
25.0000 mg | ORAL_TABLET | Freq: Every day | ORAL | Status: DC
Start: 2022-02-12 — End: 2022-02-12
  Filled 2022-02-11: qty 1

## 2022-02-11 MED ORDER — ACETAMINOPHEN 325 MG PO TABS
650.0000 mg | ORAL_TABLET | Freq: Four times a day (QID) | ORAL | Status: DC | PRN
  Administered 2022-02-13 – 2022-02-16 (×4): 650 mg via ORAL
  Filled 2022-02-11 (×4): qty 2

## 2022-02-11 MED ORDER — EPHEDRINE 5 MG/ML INJ
INTRAVENOUS | Status: AC
  Filled 2022-02-11: qty 5

## 2022-02-11 MED ORDER — INSULIN ASPART 100 UNIT/ML IJ SOLN
0.0000 [IU] | Freq: Three times a day (TID) | INTRAMUSCULAR | Status: DC
  Administered 2022-02-12 (×2): 7 [IU] via SUBCUTANEOUS
  Administered 2022-02-12: 11 [IU] via SUBCUTANEOUS
  Administered 2022-02-13 – 2022-02-14 (×4): 7 [IU] via SUBCUTANEOUS
  Administered 2022-02-14 (×2): 15 [IU] via SUBCUTANEOUS
  Administered 2022-02-15: 11 [IU] via SUBCUTANEOUS
  Administered 2022-02-15: 7 [IU] via SUBCUTANEOUS
  Administered 2022-02-15: 11 [IU] via SUBCUTANEOUS
  Administered 2022-02-16: 7 [IU] via SUBCUTANEOUS
  Administered 2022-02-16: 11 [IU] via SUBCUTANEOUS
  Administered 2022-02-16 – 2022-02-17 (×3): 7 [IU] via SUBCUTANEOUS

## 2022-02-11 MED ORDER — LACTATED RINGERS IV SOLN: INTRAVENOUS | Status: DC | PRN

## 2022-02-11 MED ORDER — PROPOFOL 10 MG/ML IV BOLUS
INTRAVENOUS | Status: DC | PRN
  Administered 2022-02-11: 170 mg via INTRAVENOUS

## 2022-02-11 MED ORDER — 0.9 % SODIUM CHLORIDE (POUR BTL) OPTIME
TOPICAL | Status: DC | PRN
  Administered 2022-02-11: 1000 mL

## 2022-02-11 MED ORDER — LIDOCAINE 2% (20 MG/ML) 5 ML SYRINGE
INTRAMUSCULAR | Status: DC | PRN
  Administered 2022-02-11: 100 mg via INTRAVENOUS

## 2022-02-11 MED ORDER — CEFAZOLIN SODIUM-DEXTROSE 2-4 GM/100ML-% IV SOLN
INTRAVENOUS | Status: AC
  Filled 2022-02-11: qty 100

## 2022-02-11 MED ORDER — PHENYLEPHRINE HCL-NACL 20-0.9 MG/250ML-% IV SOLN
INTRAVENOUS | Status: DC | PRN
  Administered 2022-02-11: 40 ug/min via INTRAVENOUS

## 2022-02-11 MED ORDER — OXYCODONE-ACETAMINOPHEN 5-325 MG PO TABS
1.0000 | ORAL_TABLET | ORAL | Status: DC | PRN
Start: 2022-02-11 — End: 2022-02-17
  Administered 2022-02-11 – 2022-02-17 (×22): 1 via ORAL
  Filled 2022-02-11 (×22): qty 1

## 2022-02-11 MED ORDER — HYDROMORPHONE HCL 1 MG/ML IJ SOLN
0.2500 mg | INTRAMUSCULAR | Status: DC | PRN
  Administered 2022-02-11 (×2): 0.5 mg via INTRAVENOUS

## 2022-02-11 MED ORDER — CHLORHEXIDINE GLUCONATE 0.12 % MT SOLN
OROMUCOSAL | Status: AC
  Filled 2022-02-11: qty 15

## 2022-02-11 MED ORDER — SODIUM CHLORIDE 0.9 % IV SOLN
INTRAVENOUS | Status: DC
Start: 2022-02-11 — End: 2022-02-12

## 2022-02-11 MED ORDER — SUGAMMADEX SODIUM 200 MG/2ML IV SOLN
INTRAVENOUS | Status: DC | PRN
  Administered 2022-02-11: 200 mg via INTRAVENOUS
  Administered 2022-02-11 (×2): 100 mg via INTRAVENOUS

## 2022-02-11 MED ORDER — LIRAGLUTIDE 18 MG/3ML ~~LOC~~ SOPN
1.8000 mg | PEN_INJECTOR | Freq: Every day | SUBCUTANEOUS | Status: DC
Start: 2022-02-11 — End: 2022-02-12

## 2022-02-11 MED ORDER — PHENYLEPHRINE 40 MCG/ML (10ML) SYRINGE FOR IV PUSH (FOR BLOOD PRESSURE SUPPORT)
PREFILLED_SYRINGE | INTRAVENOUS | Status: DC | PRN
  Administered 2022-02-11 (×5): 80 ug via INTRAVENOUS

## 2022-02-11 MED ORDER — FENTANYL CITRATE (PF) 100 MCG/2ML IJ SOLN
50.0000 ug | Freq: Once | INTRAMUSCULAR | Status: AC
  Administered 2022-02-11: 50 ug via INTRAVENOUS

## 2022-02-11 MED ORDER — PHENYLEPHRINE 40 MCG/ML (10ML) SYRINGE FOR IV PUSH (FOR BLOOD PRESSURE SUPPORT)
PREFILLED_SYRINGE | INTRAVENOUS | Status: AC
  Filled 2022-02-11: qty 10

## 2022-02-11 MED ORDER — ROSUVASTATIN CALCIUM 5 MG PO TABS
10.0000 mg | ORAL_TABLET | Freq: Every day | ORAL | Status: DC
  Administered 2022-02-11 – 2022-02-17 (×7): 10 mg via ORAL
  Filled 2022-02-11 (×7): qty 2

## 2022-02-11 MED ORDER — SODIUM CHLORIDE 0.9 % IR SOLN
Status: DC | PRN
  Administered 2022-02-11: 3000 mL

## 2022-02-11 MED ORDER — POVIDONE-IODINE 10 % EX SWAB
2.0000 "application " | Freq: Once | CUTANEOUS | Status: AC
  Administered 2022-02-11: 2 via TOPICAL

## 2022-02-11 MED ORDER — BUPIVACAINE HCL (PF) 0.25 % IJ SOLN
INTRAMUSCULAR | Status: AC
  Filled 2022-02-11: qty 30

## 2022-02-11 MED ORDER — CEFAZOLIN SODIUM-DEXTROSE 2-4 GM/100ML-% IV SOLN: 2.0000 g | INTRAVENOUS | Status: DC

## 2022-02-11 MED ORDER — ACETAMINOPHEN 500 MG PO TABS
1000.0000 mg | ORAL_TABLET | Freq: Once | ORAL | Status: AC
  Administered 2022-02-11: 1000 mg via ORAL

## 2022-02-11 MED ORDER — ALBUTEROL SULFATE (2.5 MG/3ML) 0.083% IN NEBU: 2.5000 mg | INHALATION_SOLUTION | Freq: Four times a day (QID) | RESPIRATORY_TRACT | Status: DC | PRN

## 2022-02-11 MED ORDER — FENTANYL CITRATE (PF) 100 MCG/2ML IJ SOLN
INTRAMUSCULAR | Status: AC
  Administered 2022-02-11: 50 ug via INTRAVENOUS
  Filled 2022-02-11: qty 2

## 2022-02-11 MED ORDER — DROPERIDOL 2.5 MG/ML IJ SOLN: 0.6250 mg | Freq: Once | INTRAMUSCULAR | Status: DC | PRN

## 2022-02-11 MED ORDER — ROCURONIUM BROMIDE 10 MG/ML (PF) SYRINGE
PREFILLED_SYRINGE | INTRAVENOUS | Status: DC | PRN
  Administered 2022-02-11: 40 mg via INTRAVENOUS

## 2022-02-11 MED ORDER — FENTANYL CITRATE (PF) 100 MCG/2ML IJ SOLN: 50.0000 ug | Freq: Once | INTRAMUSCULAR | Status: AC

## 2022-02-11 MED ORDER — SUCCINYLCHOLINE CHLORIDE 200 MG/10ML IV SOSY
PREFILLED_SYRINGE | INTRAVENOUS | Status: AC
  Filled 2022-02-11: qty 10

## 2022-02-11 MED ORDER — INSULIN GLARGINE-YFGN 100 UNIT/ML ~~LOC~~ SOLN
15.0000 [IU] | Freq: Every day | SUBCUTANEOUS | Status: DC
  Administered 2022-02-11 – 2022-02-14 (×4): 15 [IU] via SUBCUTANEOUS
  Filled 2022-02-11 (×5): qty 0.15

## 2022-02-11 MED ORDER — MORPHINE SULFATE (PF) 4 MG/ML IV SOLN
4.0000 mg | Freq: Once | INTRAVENOUS | Status: AC
  Administered 2022-02-11: 4 mg via INTRAVENOUS
  Filled 2022-02-11: qty 1

## 2022-02-11 MED ORDER — EPHEDRINE SULFATE-NACL 50-0.9 MG/10ML-% IV SOSY
PREFILLED_SYRINGE | INTRAVENOUS | Status: DC | PRN
  Administered 2022-02-11: 7.5 mg via INTRAVENOUS

## 2022-02-11 MED ORDER — TICAGRELOR 60 MG PO TABS
60.0000 mg | ORAL_TABLET | Freq: Two times a day (BID) | ORAL | Status: DC
  Administered 2022-02-11 – 2022-02-17 (×12): 60 mg via ORAL
  Filled 2022-02-11 (×13): qty 1

## 2022-02-11 MED ORDER — FENTANYL CITRATE (PF) 250 MCG/5ML IJ SOLN
INTRAMUSCULAR | Status: DC | PRN
  Administered 2022-02-11: 50 ug via INTRAVENOUS

## 2022-02-11 MED ORDER — ONDANSETRON HCL 4 MG/2ML IJ SOLN
INTRAMUSCULAR | Status: DC | PRN
  Administered 2022-02-11: 4 mg via INTRAVENOUS

## 2022-02-11 SURGICAL SUPPLY — 59 items
BAG COUNTER SPONGE SURGICOUNT (BAG) ×3 IMPLANT
BAG SPNG CNTER NS LX DISP (BAG) ×1
BNDG CMPR 9X4 STRL LF SNTH (GAUZE/BANDAGES/DRESSINGS) ×1
BNDG COHESIVE 1X5 TAN STRL LF (GAUZE/BANDAGES/DRESSINGS) ×1 IMPLANT
BNDG CONFORM 2 STRL LF (GAUZE/BANDAGES/DRESSINGS) ×1 IMPLANT
BNDG ELASTIC 3X5.8 VLCR STR LF (GAUZE/BANDAGES/DRESSINGS) ×3 IMPLANT
BNDG ELASTIC 4X5.8 VLCR STR LF (GAUZE/BANDAGES/DRESSINGS) ×3 IMPLANT
BNDG ESMARK 4X9 LF (GAUZE/BANDAGES/DRESSINGS) ×3 IMPLANT
BNDG GAUZE ELAST 4 BULKY (GAUZE/BANDAGES/DRESSINGS) ×3 IMPLANT
CORD BIPOLAR FORCEPS 12FT (ELECTRODE) ×3 IMPLANT
COVER SURGICAL LIGHT HANDLE (MISCELLANEOUS) ×3 IMPLANT
CUFF TOURN SGL QUICK 18X4 (TOURNIQUET CUFF) ×3 IMPLANT
CUFF TOURN SGL QUICK 24 (TOURNIQUET CUFF)
CUFF TRNQT CYL 24X4X16.5-23 (TOURNIQUET CUFF) IMPLANT
DRAIN PENROSE 1/4X12 LTX STRL (WOUND CARE) IMPLANT
DRAPE SURG 17X23 STRL (DRAPES) ×3 IMPLANT
DRSG ADAPTIC 3X8 NADH LF (GAUZE/BANDAGES/DRESSINGS) ×3 IMPLANT
DRSG EMULSION OIL 3X3 NADH (GAUZE/BANDAGES/DRESSINGS) ×1 IMPLANT
ELECT REM PT RETURN 9FT ADLT (ELECTROSURGICAL)
ELECTRODE REM PT RTRN 9FT ADLT (ELECTROSURGICAL) IMPLANT
GAUZE SPONGE 4X4 12PLY STRL (GAUZE/BANDAGES/DRESSINGS) ×3 IMPLANT
GAUZE XEROFORM 1X8 LF (GAUZE/BANDAGES/DRESSINGS) ×3 IMPLANT
GAUZE XEROFORM 5X9 LF (GAUZE/BANDAGES/DRESSINGS) IMPLANT
GLOVE SURG ORTHO LTX SZ8 (GLOVE) ×3 IMPLANT
GLOVE SURG UNDER POLY LF SZ8.5 (GLOVE) ×3 IMPLANT
GOWN STRL REUS W/ TWL LRG LVL3 (GOWN DISPOSABLE) ×6 IMPLANT
GOWN STRL REUS W/ TWL XL LVL3 (GOWN DISPOSABLE) ×2 IMPLANT
GOWN STRL REUS W/TWL LRG LVL3 (GOWN DISPOSABLE) ×6
GOWN STRL REUS W/TWL XL LVL3 (GOWN DISPOSABLE) ×2
HANDPIECE INTERPULSE COAX TIP (DISPOSABLE)
KIT BASIN OR (CUSTOM PROCEDURE TRAY) ×3 IMPLANT
KIT TURNOVER KIT B (KITS) ×3 IMPLANT
MANIFOLD NEPTUNE II (INSTRUMENTS) ×3 IMPLANT
NDL HYPO 25GX1X1/2 BEV (NEEDLE) IMPLANT
NEEDLE HYPO 25GX1X1/2 BEV (NEEDLE) IMPLANT
NS IRRIG 1000ML POUR BTL (IV SOLUTION) ×3 IMPLANT
PACK ORTHO EXTREMITY (CUSTOM PROCEDURE TRAY) ×3 IMPLANT
PAD ARMBOARD 7.5X6 YLW CONV (MISCELLANEOUS) ×6 IMPLANT
PAD CAST 4YDX4 CTTN HI CHSV (CAST SUPPLIES) ×2 IMPLANT
PADDING CAST COTTON 4X4 STRL (CAST SUPPLIES) ×2
SET CYSTO W/LG BORE CLAMP LF (SET/KITS/TRAYS/PACK) IMPLANT
SET HNDPC FAN SPRY TIP SCT (DISPOSABLE) IMPLANT
SOAP 2 % CHG 4 OZ (WOUND CARE) ×3 IMPLANT
SPLINT FINGER 4.25 911904 (SOFTGOODS) ×1 IMPLANT
SPONGE T-LAP 18X18 ~~LOC~~+RFID (SPONGE) ×3 IMPLANT
SPONGE T-LAP 4X18 ~~LOC~~+RFID (SPONGE) ×3 IMPLANT
SUT ETHILON 4 0 PS 2 18 (SUTURE) IMPLANT
SUT ETHILON 5 0 P 3 18 (SUTURE)
SUT NYLON ETHILON 5-0 P-3 1X18 (SUTURE) IMPLANT
SUT PROLENE 4 0 PS 2 18 (SUTURE) ×1 IMPLANT
SWAB COLLECTION DEVICE MRSA (MISCELLANEOUS) ×3 IMPLANT
SWAB CULTURE ESWAB REG 1ML (MISCELLANEOUS) ×1 IMPLANT
SYR CONTROL 10ML LL (SYRINGE) IMPLANT
TOWEL GREEN STERILE (TOWEL DISPOSABLE) ×3 IMPLANT
TOWEL GREEN STERILE FF (TOWEL DISPOSABLE) ×3 IMPLANT
TUBE CONNECTING 12X1/4 (SUCTIONS) ×3 IMPLANT
UNDERPAD 30X36 HEAVY ABSORB (UNDERPADS AND DIAPERS) ×3 IMPLANT
WATER STERILE IRR 1000ML POUR (IV SOLUTION) ×3 IMPLANT
YANKAUER SUCT BULB TIP NO VENT (SUCTIONS) ×3 IMPLANT

## 2022-02-11 NOTE — Assessment & Plan Note (Addendum)
A1c 8.4%, relatively well controlled for Age. ? ?Glucose still high, difficult to control ?-Hold Jardiance while sick ?-Hold Victoza  ?- Continue glargine, increase again ?- Continue SS corrections  ?- Continue mealtime aspart  ?

## 2022-02-11 NOTE — Plan of Care (Signed)
?  Problem: Education: ?Goal: Knowledge of General Education information will improve ?Description: Including pain rating scale, medication(s)/side effects and non-pharmacologic comfort measures ?Outcome: Progressing ?  ?Problem: Clinical Measurements: ?Goal: Ability to maintain clinical measurements within normal limits will improve ?Outcome: Progressing ?Goal: Will remain free from infection ?Outcome: Progressing ?Goal: Diagnostic test results will improve ?Outcome: Progressing ?Goal: Respiratory complications will improve ?Outcome: Progressing ?Goal: Cardiovascular complication will be avoided ?Outcome: Progressing ?  ?Problem: Nutrition: ?Goal: Adequate nutrition will be maintained ?Outcome: Progressing ?  ?Problem: Activity: ?Goal: Risk for activity intolerance will decrease ?Outcome: Progressing ?  ?

## 2022-02-11 NOTE — Assessment & Plan Note (Addendum)
-   Transition to Linezolid day 1 of 14 days ?- ID follow up in 1 week ? ? ? ? ? ?

## 2022-02-11 NOTE — Op Note (Signed)
PREOPERATIVE DIAGNOSIS: Right thumb flexor pollicis longus tenosynovitis ?Right thumb abscess dorsal aspect of thumb ? ?POSTOPERATIVE DIAGNOSIS: Same ? ?ATTENDING SURGEON: Dr. Bradly Bienenstock who scrubbed and present for the entire procedure ? ?ASSISTANT SURGEON: None ? ?ANESTHESIA: General via LMA ? ?OPERATIVE PROCEDURE: Right thumb tendon sheath incision and drainage flexor pollicis longus ?Right thumb incision and drainage dorsal thumb abscess complicated ? ?IMPLANTS none: ? ?EBL: Minimal ? ?RADIOGRAPHIC INTERPRETATION: None ? ?SURGICAL INDICATIONS:Patient is a right-hand-dominant gentleman who presented to the emergency department today with worsening infection to the right thumb patient was recommended undergo the above procedure.  Risk benefits alternatives discussed in detail with the patient and signed informed consent was obtained.  The risks include but not limited to bleeding infection damage nearby nerves arteries or tendons loss of motion of the wrist and digits and need for further surgical invention. ? ?SURGICAL TECHNIQUE: The patient was prepped identified in the preoperative holding area marked apart a marker made in the right thumb indicate correct operative site.  The patient brought back operating placed supine on anesthesia table where the general anesthetic was administered.  Patient received preoperative antibiotics in the emergency department.  Well-padded tourniquet placed on the right brachium and stay with the appropriate drape.  The right upper extremities then prepped and draped normal sterile fashion.  A timeout was called the correct site was identified the procedure then begun.  Attention was then turned to the right thumb the nail plate was then elevated and the eponychial him was then elevated decompressing the abscess over the dorsal aspect of the thumb.  Small amount of purulence was expressed from this dorsal draining wound.  Following this a Bruner incision made directly over the  volar aspect of the thumb where dissection was carried down through the skin and subcutaneous tissue with a large amount of purulence was then encountered within the septae of the thumb extending all the way down to the flexor sheath.  There was fluid within the flexor sheath this fluid and purulent tissue was then cultured for aerobic and anaerobic cultures.  After opening up the flexor sheath copious wound irrigation was then done of the flexor sheath 3 L of saline solution irrigated throughout the region.  Excisional debridement of the skin and subcutaneous tissue and the devitalized tissue was then done circumferentially around the thumb.  After excisional debridement and drainage the wound was loosely closed with simple Prolene suture.  Adaptic dressing a sterile compressive bandage then applied.  The patient tolerated the procedure well the tourniquet been up and was deflated with good perfusion of the thumb. ? ?POSTOPERATIVE PLAN: Patient admitted to the internal medicine service.  Continue on the IV antibiotics.  Await the cultures until transitioning to oral antibiotics.  Continue to follow him while he is in the hospital.  Likely wound check on Friday.  Keep the bandage on until we see the wound on Friday. ?

## 2022-02-11 NOTE — Anesthesia Preprocedure Evaluation (Addendum)
Anesthesia Evaluation  ?Patient identified by MRN, date of birth, ID band ?Patient awake ? ? ? ?Reviewed: ?Allergy & Precautions, NPO status , Patient's Chart, lab work & pertinent test results ? ?Airway ?Mallampati: III ? ?TM Distance: >3 FB ?Neck ROM: Full ? ? ? Dental ? ?(+) Dental Advisory Given ?  ?Pulmonary ?sleep apnea ,  ?  ?Pulmonary exam normal ?breath sounds clear to auscultation ? ? ? ? ? ? Cardiovascular ?hypertension, + angina + CAD  ?Normal cardiovascular exam+ dysrhythmias  ?Rhythm:Regular Rate:Normal ? ? ?  ?Neuro/Psych ?negative neurological ROS ?   ? GI/Hepatic ?Neg liver ROS, GERD  ,  ?Endo/Other  ?diabetesMorbid obesity ? Renal/GU ?Renal disease  ? ?  ?Musculoskeletal ? ?(+) Arthritis ,  ? Abdominal ?(+) + obese,   ?Peds ? Hematology ?negative hematology ROS ?(+)   ?Anesthesia Other Findings ? ? Reproductive/Obstetrics ? ?  ? ? ? ? ? ? ? ? ? ? ? ? ? ?  ?  ? ? ? ? ? ? ? ?Anesthesia Physical ?Anesthesia Plan ? ?ASA: 3 ? ?Anesthesia Plan: General  ? ?Post-op Pain Management: Tylenol PO (pre-op)*  ? ?Induction: Intravenous, Rapid sequence and Cricoid pressure planned ? ?PONV Risk Score and Plan: 3 and Ondansetron, Dexamethasone and Treatment may vary due to age or medical condition ? ?Airway Management Planned: Oral ETT ? ?Additional Equipment:  ? ?Intra-op Plan:  ? ?Post-operative Plan: Extubation in OR ? ?Informed Consent: I have reviewed the patients History and Physical, chart, labs and discussed the procedure including the risks, benefits and alternatives for the proposed anesthesia with the patient or authorized representative who has indicated his/her understanding and acceptance.  ? ? ? ?Dental advisory given ? ?Plan Discussed with: CRNA ? ?Anesthesia Plan Comments:   ? ? ? ? ? ?Anesthesia Quick Evaluation ? ?

## 2022-02-11 NOTE — Assessment & Plan Note (Addendum)
Resolved with holding Jardiance and Subq insulin.  This was likely some mild SGLT2i associated diabetic ketoacidosis, now resolved. ?

## 2022-02-11 NOTE — Assessment & Plan Note (Addendum)
-   Continue aspirin, Crestor, Brilinta ?  ?

## 2022-02-11 NOTE — Assessment & Plan Note (Signed)
Continue Crestor 

## 2022-02-11 NOTE — Assessment & Plan Note (Signed)
BMI 40.72 kg/m2 ?

## 2022-02-11 NOTE — ED Provider Notes (Signed)
[facility] EMERGENCY DEPARTMENT Provider Note   CSN: [id number] Arrival date & time: 02/11/22  1306     History  Chief Complaint  Patient presents with   Finger Injury   Wound Infection    Erik Hancock is a 71 y.o. male.  This is a 71 y.o. male  with significant medical history as below, including CKD, CAD, DM who presents to the ED with complaint of finger injury. Pt reports 3 days ago he was moving hay bales, thinks some hay stuck in his finger or perhaps was stuck by baling wire. Ongoing pain to right hand since then, fever, n/v, malaise, severe hand pain. He is right handed. Seen at [facility] and sent to ED for eval. Significant pain and swelling to right thumb, wound noted. Pain to wrist right.       Past Medical History: No date: Arthritis     Comment:  "all over" (07/29/2018) No date: CAD S/P percutaneous coronary angioplasty     Comment:  a. stent to mLAD 11/2001. b. DES to PDA/mLCx 2007. c. DES              to ostial LAD 07/2018. No date: Chronic lower back pain No date: CKD (chronic kidney disease), stage III (HCC) No date: CKD (chronic kidney disease), stage III (HCC) No date: Diabetic retinopathy (HCC) No date: Diabetic retinopathy (HCC) No date: GERD (gastroesophageal reflux disease) No date: High cholesterol No date: History of kidney stones No date: Hypertension No date: Mobitz type 2 second degree atrioventricular block     Comment:  a. noted during 07/2018 adm, metoprolol discontinued. 07/28/2018: Morbid obesity (HCC) No date: Prostatitis 2013: Pulmonary nodule     Comment:  CT No date: Sleep apnea     Comment:  stopbang=5 No date: Suspected sleep apnea No date: Type II diabetes mellitus (HCC)  Past Surgical History: JUNE 2003: CARDIAC CATHETERIZATION     Comment:  PATENT LAD STENT/ BODERLINE OBSTRUCTIVE DISEASE               POSTERIOR DESCENDING ARTERY/ NORMAL LVF 2017: CATARACT EXTRACTION W/ INTRAOCULAR LENS  IMPLANT, BILATERAL;   Bilateral 2009: CERVICAL SCOVILLE FORAMINOTOMY W/ EXCISION OF HERNIATED NUCLEC  PULPOSUS     Comment:  C6 - 7 03/30/2006: CORONARY ANGIOPLASTY WITH STENT PLACEMENT     Comment:  DR EDMUNDS - 90% LCx@OM2  TAXUS  DES 3.0 X 20 -> 3.25 mm,              95% RPDA -- TAXUS DES 3.0 X 16 --> 3.5 mm 11/2001: CORONARY ANGIOPLASTY WITH STENT PLACEMENT     Comment:  (Dr. Ty Hilts for Dr. Mayford Knife) - mLAD@D2  - TAXUS EXPRESS               DES 3.5 x 15  07/29/2018: CORONARY ANGIOPLASTY WITH STENT PLACEMENT 07/29/2018: CORONARY STENT INTERVENTION; N/A     Comment:  Procedure: CORONARY STENT INTERVENTION;  Surgeon:               Marykay Lex, MD;  Location: [facility] INVASIVE CV LAB;                Service: Cardiovascular;  Laterality: N/A; 05/04/2012: CYSTOSCOPY W/ RETROGRADES     Comment:  Procedure: CYSTOSCOPY WITH RETROGRADE PYELOGRAM;                Surgeon: Milford Cage, MD;  Location: [facility] ORS;  Service: Urology;  Laterality: Left; 05/04/2012: CYSTOSCOPY W/ URETERAL STENT PLACEMENT     Comment:  Procedure: CYSTOSCOPY WITH STENT REPLACEMENT;  Surgeon:               Milford Cage, MD;  Location: [facility] ORS;  Service:               Urology;  Laterality: Left; 04/04/2012: CYSTOSCOPY WITH STENT PLACEMENT; Left 05/04/2012: CYSTOSCOPY WITH URETEROSCOPY     Comment:  Procedure: CYSTOSCOPY WITH URETEROSCOPY;  Surgeon:               Milford Cage, MD;  Location: [facility] ORS;  Service:               Urology;  Laterality: Left;   07/29/2018: LEFT HEART CATH AND CORONARY ANGIOGRAPHY; N/A     Comment:  Procedure: LEFT HEART CATH AND CORONARY ANGIOGRAPHY;                Surgeon: Marykay Lex, MD;  Location: [facility] INVASIVE CV               LAB;  Service: Cardiovascular;  Laterality: N/A; 03-14-2003: LEFT URETEROSCOPIC STONE EXTRACTION     Comment:  X2    The history is provided by the patient and the spouse. No language interpreter was used.      Home Medications Prior to Admission  medications   Medication Sig Start Date End Date Taking? Authorizing Provider  aspirin 81 MG tablet Take 81 mg by mouth daily.   Yes [provider]  Insulin Aspart FlexPen 100 UNIT/ML SOPN Inject 30 Units into the skin 2 (two) times daily with a meal. 05/20/21  Yes Romero Belling, MD  insulin glargine (LANTUS SOLOSTAR) 100 UNIT/ML Solostar Pen Inject 15 Units into the skin at bedtime. 08/21/21  Yes Romero Belling, MD  liraglutide (VICTOZA) 18 MG/3ML SOPN Inject 1.8 mg into the skin daily. 10/06/21  Yes Romero Belling, MD  pantoprazole (PROTONIX) 40 MG tablet Take 1 tablet (40 mg total) by mouth daily. 12/01/21  Yes Donita Brooks, MD  rosuvastatin (CRESTOR) 10 MG tablet Take 1 tablet (10 mg total) by mouth daily. 06/26/21  Yes Donita Brooks, MD  ticagrelor (BRILINTA) 60 MG TABS tablet Take 1 tablet (60 mg total) by mouth 2 (two) times daily. 10/09/21  Yes Chilton Si, MD  valsartan (DIOVAN) 160 MG tablet Take 1 tablet (160 mg total) by mouth daily. 11/06/21  Yes Chilton Si, MD  ACCU-CHEK AVIVA PLUS test strip USE STRIP TO CHECK GLUCOSE TWICE DAILY TO THREE TIMES DAILY **NEED OFFICE VISIT FOR REFILLS** 12/02/20   Donita Brooks, MD  ciprofloxacin (CILOXAN) 0.3 % ophthalmic solution SMARTSIG:In Eye(s) 02/03/22   [provider]  Continuous Blood Gluc Sensor (FREESTYLE LIBRE 2 SENSOR) MISC 1 Device by Does not apply route every 14 (fourteen) days. 08/21/21   Romero Belling, MD  Insulin Pen Needle (BD ULTRA-FINE PEN NEEDLES) 29G X 12.7MM MISC 1 Device by Other route in the morning, at noon, in the evening, and at bedtime. 06/17/20   Romero Belling, MD  JARDIANCE 25 MG TABS tablet Take 1 tablet by mouth once daily 02/11/22   Donita Brooks, MD  nitroGLYCERIN (NITROLINGUAL) 0.4 MG/SPRAY spray Place 1 spray under the tongue every 5 (five) minutes x 3 doses as needed for chest pain. 03/07/20   Chilton Si, MD  traMADol (ULTRAM) 50 MG tablet Take 50 mg by mouth 2 (two) times  daily as needed.  Patient not taking: Reported on 02/11/2022 01/10/22   [provider]      Allergies    Beta adrenergic blockers    Review of Systems   Review of Systems  Constitutional:  Positive for chills and fever.  HENT:  Negative for facial swelling and trouble swallowing.   Eyes:  Negative for photophobia and visual disturbance.  Respiratory:  Negative for cough and shortness of breath.   Cardiovascular:  Negative for chest pain and palpitations.  Gastrointestinal:  Positive for nausea and vomiting. Negative for abdominal pain.  Endocrine: Negative for polydipsia and polyuria.  Genitourinary:  Negative for difficulty urinating and hematuria.  Musculoskeletal:  Positive for arthralgias. Negative for gait problem and joint swelling.  Skin:  Negative for pallor and rash.  Neurological:  Negative for syncope and headaches.  Psychiatric/Behavioral:  Negative for agitation and confusion.    Physical Exam Updated Vital Signs BP (!) 129/49   Pulse 91   Temp 98.2 F (36.8 C) (Oral)   Resp 18   Ht 5\' 7"  (1.702 m)   Wt 117.9 kg   SpO2 96%   BMI 40.71 kg/m  Physical Exam Vitals and nursing note reviewed.  Constitutional:      General: He is not in acute distress.    Appearance: He is well-developed. He is not diaphoretic.  HENT:     Head: Normocephalic and atraumatic.     Right Ear: External ear normal.     Left Ear: External ear normal.     Mouth/Throat:     Mouth: Mucous membranes are moist.  Eyes:     General: No scleral icterus. Cardiovascular:     Rate and Rhythm: Normal rate and regular rhythm.     Pulses: Normal pulses.     Heart sounds: Normal heart sounds.  Pulmonary:     Effort: Pulmonary effort is normal. No respiratory distress.     Breath sounds: Normal breath sounds.  Abdominal:     General: Abdomen is flat.     Palpations: Abdomen is soft.     Tenderness: There is no abdominal tenderness.  Musculoskeletal:        General: Normal range of  motion.     Cervical back: Normal range of motion.     Right lower leg: No edema.     Left lower leg: No edema.     Comments: Wound noted to palmar surface of right thumb. Severe pain with passive flexion, pain along flexor tendon sheath, enlarged digit, finger held in passive flexion. Wound also noted to 2nd digit. Similar symptoms with 2nd digit but not as severe. 2+ radial pulse, sensation intact distally.   Skin:    General: Skin is warm and dry.     Capillary Refill: Capillary refill takes less than 2 seconds.  Neurological:     Mental Status: He is alert and oriented to person, place, and time.  Psychiatric:        Mood and Affect: Mood normal.        Behavior: Behavior normal.     ED Results / Procedures / Treatments   Labs (all labs ordered are listed, but only abnormal results are displayed) Labs Reviewed  COMPREHENSIVE METABOLIC PANEL - Abnormal; Notable for the following components:      Result Value   CO2 12 (*)    Glucose, Bld 213 (*)    Creatinine, Ser 1.61 (*)    Albumin 3.4 (*)    Total Bilirubin 1.9 (*)  GFR, Estimated 46 (*)    Anion gap 20 (*)    All other components within normal limits  CBC WITH DIFFERENTIAL/PLATELET - Abnormal; Notable for the following components:   WBC 19.3 (*)    RDW 15.6 (*)    Neutro Abs 17.9 (*)    Lymphs Abs 0.4 (*)    Abs Immature Granulocytes 0.14 (*)    All other components within normal limits  CBG MONITORING, ED - Abnormal; Notable for the following components:   Glucose-Capillary 187 (*)    All other components within normal limits  CULTURE, BLOOD (ROUTINE X 2)  CULTURE, BLOOD (ROUTINE X 2)  LACTIC ACID, PLASMA  URINALYSIS, ROUTINE W REFLEX MICROSCOPIC    EKG None  Radiology DG Hand Complete Right  Result Date: 02/11/2022 CLINICAL DATA:  Right hand pain and swelling. Rule out osteomyelitis. EXAM: RIGHT HAND - COMPLETE 3+ VIEW COMPARISON:  None. FINDINGS: Of note, the image quality on lateral view is degraded by  patient motion artifact. Cortical irregularity at the base of the thumb distal phalanx without lucent fracture line. No evidence of acute fracture elsewhere in the hand. No cortical erosions. No subcutaneous emphysema. Moderate osteoarthritis of the STT joint and thumb CMC joint. Vascular calcifications throughout the right distal forearm and hand. Tiny metallic foreign body in the volar soft tissues of the index finger at the level of the PIP joint. IMPRESSION: 1. No radiographic findings to suggest osteomyelitis. Of note, MRI has increased sensitivity for detection of osteomyelitis compared to radiographs. 2. Cortical irregularity at the base of the thumb distal phalanx, suggestive of sequela of prior trauma. No evidence of acute fracture in the right hand. 3. Tiny metallic foreign body in the volar soft tissues of the index finger. Electronically Signed   By: Sherron Ales M.D.   On: 02/11/2022 11:27    Procedures .Critical Care Performed by: Sloan Leiter, DO Authorized by: Sloan Leiter, DO   Critical care provider statement:    Critical care time (minutes):  30   Critical care time was exclusive of:  Separately billable procedures and treating other patients   Critical care was necessary to treat or prevent imminent or life-threatening deterioration of the following conditions:  Trauma   Critical care was time spent personally by me on the following activities:  Development of treatment plan with patient or surrogate, discussions with consultants, evaluation of patient's response to treatment, examination of patient, ordering and review of laboratory studies, ordering and review of radiographic studies, ordering and performing treatments and interventions, pulse oximetry, re-evaluation of patient's condition, review of old charts and obtaining history from patient or surrogate   Care discussed with: admitting provider   Comments:     Sent directly to OR for emergent intervention     Medications Ordered in ED Medications  Ampicillin-Sulbactam (UNASYN) 3 g in sodium chloride 0.9 % 100 mL IVPB ( Intravenous Automatically Held 02/19/22 2045)  vancomycin (VANCOCIN) 2,250 mg in sodium chloride 0.9 % 500 mL IVPB ( Intravenous Automatically Held 02/19/22 1445)  chlorhexidine (HIBICLENS) 4 % liquid 4 application. (has no administration in time range)  sodium chloride irrigation 0.9 % (3,000 mLs Irrigation Given 02/11/22 1559)  insulin aspart (novoLOG) injection 0-7 Units (2 Units Subcutaneous Given 02/11/22 1639)  chlorhexidine (PERIDEX) 0.12 % solution (has no administration in time range)  ceFAZolin (ANCEF) 2-4 GM/100ML-% IVPB (has no administration in time range)  0.9 % irrigation (POUR BTL) (1,000 mLs Irrigation Given 02/11/22 1656)  oxyCODONE-acetaminophen (PERCOCET/ROXICET)  5-325 MG per tablet 1 tablet (1 tablet Oral Given 02/11/22 1330)  morphine (PF) 4 MG/ML injection 4 mg (4 mg Intravenous Given 02/11/22 1442)  lactated ringers bolus 1,000 mL (0 mLs Intravenous Stopped 02/11/22 1618)  ondansetron (ZOFRAN) injection 4 mg (4 mg Intravenous Given 02/11/22 1441)  povidone-iodine 10 % swab 2 application. (2 application. Topical Given 02/11/22 1640)  Tdap (BOOSTRIX) injection 0.5 mL (0.5 mLs Intramuscular Given 02/11/22 1558)  fentaNYL (SUBLIMAZE) injection 50 mcg (50 mcg Intravenous Given 02/11/22 1645)  fentaNYL (SUBLIMAZE) injection 50 mcg (50 mcg Intravenous Given 02/11/22 1700)    ED Course/ Medical Decision Making/ A&P Clinical Course as of 02/11/22 1732  Wed Feb 11, 2022  1542 Discussed orthopedics, will take patient to the OR.  Recommend medical admission with Ortho on consult [SG]    Clinical Course User Index [SG] Sloan Leiter, DO                           Medical Decision Making Risk Prescription drug management. Decision regarding hospitalization.   Initial Impression and Ddx This patient presents to the Emergency Department for the above complaint. This  involves an extensive number of treatment options and is a complaint that carries with it a high risk of complications and morbidity. Vital signs were reviewed.   Serious etiologies considered. Ddx includes but is not limited to: cellulitis, fb, flexor tenosynovitis, fx  Patient PMH that increases complexity of ED encounter:  CKD  Social determinants of health include - na/    Previous records obtained and reviewed    Additional history obtained from spouse   Interpretation of Diagnostics Labs & imaging results that were available during my care of the patient were visualized by me and considered in my medical decision making.   I reviewed Xr from earlier in the day from UC, pos metallic FB.  Cardiac monitoring reviewed and interpreted personally which shows NSR   Labs reviewed, profound leukocytosis 19.3.  Creatinine is elevated from his baseline at 1.6.  IV fluids in process. Concern for AKI.   Blood cultures were sent. Started ABX.   Patient Reassessment and Ultimate Disposition/Management  Patient management required discussion with the following services or consulting groups:  Orthopedic Surgery   Clinical Course as of 02/11/22 1732  Wed Feb 11, 2022  1542 Discussed orthopedics, will take patient to the OR.  Recommend medical admission with Ortho on consult [SG]    Clinical Course User Index [SG] Sloan Leiter, DO    Concern for flexor tenosynovitis to right 1st digit. Cultures sent. Start vanco/unasyn. Analgesics, NPO. Consult to Orthopedics, they will come to eval.  Discussed with Ortho, they plan to take him to the OR shortly.   Patient is hemodynamically stable.  Agreeable to plan.   We will admit to hospitalist service. D/w Dr Katrinka Blazing. Accepts pt.        Complexity of Problems Addressed Acute illness or injury that poses threat of life of bodily function  Additional Data Reviewed and Analyzed Further history obtained from: Further history from  spouse/family member, Past medical history and medications listed in the EMR, Prior ED visit notes, Care Everywhere, and Prior labs/imaging results  Patient Encounter Risk Assessment Prescriptions, Consideration of hospitalization, and Major procedures      This chart was dictated using voice recognition software.  Despite best efforts to proofread,  errors can occur which can change the documentation meaning.  Final Clinical Impression(s) / ED Diagnoses Final diagnoses:  Injury of finger of right hand, initial encounter  Flexor tenosynovitis of thumb  AKI (acute kidney injury) [facility])    Rx / DC Orders ED Discharge Orders     None         Sloan Leiter, DO 02/11/22 1732

## 2022-02-11 NOTE — Discharge Instructions (Addendum)
KEEP BANDAGE CLEAN AND DRY °CALL OFFICE FOR F/U APPT [phone] °KEEP HAND ELEVATED ABOVE HEART °OK TO APPLY ICE TO OPERATIVE AREA °CONTACT OFFICE IF ANY WORSENING PAIN OR CONCERNS. °

## 2022-02-11 NOTE — H&P (Addendum)
?History and Physical  ? ? ?Patient: Erik Hancock DOB: [birth year] ?DOA: 02/11/2022 ?DOS: the patient was seen and examined on 02/11/2022 ?PCP: [name], MD  ?Patient coming from: Home ? ?Chief Complaint:  ?Chief Complaint  ?Patient presents with  ? Finger Injury  ? Wound Infection  ? ?HPI: Erik Hancock is a 71 y.o. male with medical history significant of CAD s/p PCI, OSA on CPAP, DM, HTN, and HLD who presented with a 3-day history of right hand pain with swelling.  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  Since that time he had progressively worsening swelling and pain that he rates as a 10 out of 10 on the pain scale.  Reported associated symptoms of fever, nausea, and vomiting.  Of note patient had just recently been evaluated by cardiology for perioperative cardiovascular risk assessment for left partial knee replacement on 3/20.  He had a reassuring stress test in November 2022 and his risk assessment was 6.6% of [place].  Upon admission his emergency department patient was noted to have WBC 19.3, CO2 12, BUN 14, creatinine 1.61, glucose 213, anion gap 20, lactic acid 1.6.  X-rays of the right hand did not show any signs of osteomyelitis.  Blood cultures were obtained.  Patient was placed on empiric antibiotics of vancomycin and Unasyn. ? ?Review of Systems: As mentioned in the history of present illness. All other systems reviewed and are negative. ?Past Medical History:  ?Diagnosis Date  ? Arthritis   ? "all over" (07/29/2018)  ? CAD S/P percutaneous coronary angioplasty   ? a. stent to mLAD 11/2001. b. DES to PDA/mLCx 2007. c. DES to ostial LAD 07/2018.  ? Chronic lower back pain   ? CKD (chronic kidney disease), stage III ([place])   ? CKD (chronic kidney disease), stage III ([place])   ? Diabetic retinopathy ([place])   ? Diabetic retinopathy ([place])   ? GERD (gastroesophageal reflux disease)   ? High  cholesterol   ? History of kidney stones   ? Hypertension   ? Mobitz type 2 second degree atrioventricular block   ? a. noted during 07/2018 adm, metoprolol discontinued.  ? Morbid obesity ([place]) 07/28/2018  ? Prostatitis   ? Pulmonary nodule 2013  ? CT  ? Sleep apnea   ? stopbang=5  ? Suspected sleep apnea   ? Type II diabetes mellitus ([place])   ? ?Past Surgical History:  ?Procedure Laterality Date  ? CARDIAC CATHETERIZATION  JUNE 2003  ? PATENT LAD STENT/ BODERLINE OBSTRUCTIVE DISEASE POSTERIOR DESCENDING ARTERY/ NORMAL LVF  ? CATARACT EXTRACTION W/ INTRAOCULAR LENS  IMPLANT, BILATERAL Bilateral 2017  ? CERVICAL SCOVILLE FORAMINOTOMY W/ EXCISION OF HERNIATED [place] PULPOSUS  2009  ? C6 - 7  ? CORONARY ANGIOPLASTY WITH STENT PLACEMENT  03/30/2006  ? DR EDMUNDS - 90% LCx@OM2  TAXUS  DES 3.0 X 20 -> 3.25 mm,   95% RPDA -- TAXUS DES 3.0 X 16 --> 3.5 mm  ? CORONARY ANGIOPLASTY WITH STENT PLACEMENT  11/2001  ? (Dr. Ilda Foil for Dr. Radford Pax) - mLAD@D2  - TAXUS EXPRESS DES 3.5 x 15   ? CORONARY ANGIOPLASTY WITH STENT PLACEMENT  07/29/2018  ? CORONARY STENT INTERVENTION N/A 07/29/2018  ? Procedure: CORONARY STENT INTERVENTION;  Surgeon: Leonie Man, MD;  Location: [facility];  Service: Cardiovascular;  Laterality: N/A;  ? CYSTOSCOPY W/ RETROGRADES  05/04/2012  ?  Procedure: CYSTOSCOPY WITH RETROGRADE PYELOGRAM;  Surgeon: Milford Cage, MD;  Location: [facility];  Service: Urology;  Laterality: Left;  ? CYSTOSCOPY W/ URETERAL STENT PLACEMENT  05/04/2012  ? Procedure: CYSTOSCOPY WITH STENT REPLACEMENT;  Surgeon: Milford Cage, MD;  Location: [facility];  Service: Urology;  Laterality: Left;  ? CYSTOSCOPY WITH STENT PLACEMENT Left 04/04/2012  ? CYSTOSCOPY WITH URETEROSCOPY  05/04/2012  ? Procedure: CYSTOSCOPY WITH URETEROSCOPY;  Surgeon: Milford Cage, MD;  Location: [facility];  Service: Urology;  Laterality: Left;   ? ?  ? LEFT HEART CATH AND CORONARY ANGIOGRAPHY N/A 07/29/2018  ? Procedure: LEFT HEART CATH AND  CORONARY ANGIOGRAPHY;  Surgeon: Marykay Lex, MD;  Location: [facility];  Service: Cardiovascular;  Laterality: N/A;  ? LEFT URETEROSCOPIC STONE EXTRACTION  03-14-2003  ? X2  ? ?Social History:  reports that he has never smoked. He has never used smokeless tobacco. He reports that he does not drink alcohol and does not use drugs. ? ?Allergies  ?Allergen Reactions  ? Beta Adrenergic Blockers   ?  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  ? ? ?Family History  ?Problem Relation Age of Onset  ? Hypertension Mother   ? Heart attack Father   ? Heart disease Father   ? Alzheimer's disease Father   ? Diabetes Father   ? Heart attack Paternal Grandfather   ? Heart disease Paternal Grandfather   ? Lupus Sister   ? ? ?Prior to Admission medications   ?Medication Sig Start Date End Date Taking? Authorizing Provider  ?aspirin 81 MG tablet Take 81 mg by mouth daily.   Yes [provider]  ?Insulin Aspart FlexPen 100 UNIT/ML SOPN Inject 30 Units into the skin 2 (two) times daily with a meal. 05/20/21  Yes Romero Belling, MD  ?insulin glargine (LANTUS SOLOSTAR) 100 UNIT/ML Solostar Pen Inject 15 Units into the skin at bedtime. 08/21/21  Yes Romero Belling, MD  ?liraglutide (VICTOZA) 18 MG/3ML SOPN Inject 1.8 mg into the skin daily. 10/06/21  Yes Romero Belling, MD  ?pantoprazole (PROTONIX) 40 MG tablet Take 1 tablet (40 mg total) by mouth daily. 12/01/21  Yes Donita Brooks, MD  ?rosuvastatin (CRESTOR) 10 MG tablet Take 1 tablet (10 mg total) by mouth daily. 06/26/21  Yes Donita Brooks, MD  ?ticagrelor (BRILINTA) 60 MG TABS tablet Take 1 tablet (60 mg total) by mouth 2 (two) times daily. 10/09/21  Yes Chilton Si, MD  ?valsartan (DIOVAN) 160 MG tablet Take 1 tablet (160 mg total) by mouth daily. 11/06/21  Yes Chilton Si, MD  ?ACCU-CHEK AVIVA PLUS test strip USE STRIP TO CHECK GLUCOSE TWICE DAILY TO THREE TIMES DAILY **NEED OFFICE VISIT FOR REFILLS** 12/02/20   Donita Brooks, MD   ?ciprofloxacin (CILOXAN) 0.3 % ophthalmic solution SMARTSIG:In Eye(s) 02/03/22   [provider]  ?Continuous Blood Gluc Sensor (FREESTYLE LIBRE 2 SENSOR) MISC 1 Device by Does not apply route every 14 (fourteen) days. 08/21/21   Romero Belling, MD  ?Insulin Pen Needle (BD ULTRA-FINE PEN NEEDLES) 29G X 12.7MM MISC 1 Device by Other route in the morning, at noon, in the evening, and at bedtime. 06/17/20   Romero Belling, MD  ?JARDIANCE 25 MG TABS tablet Take 1 tablet by mouth once daily 02/11/22   Donita Brooks, MD  ?nitroGLYCERIN (NITROLINGUAL) 0.4 MG/SPRAY spray Place 1 spray under the tongue every 5 (five) minutes x 3 doses as needed for chest pain. 03/07/20  Skeet Latch, MD  ?traMADol (ULTRAM) 50 MG tablet Take 50 mg by mouth 2 (two) times daily as needed. ?Patient not taking: Reported on 02/11/2022 01/10/22   [provider]  ? ? ?Physical Exam: ?Vitals:  ? 02/11/22 1319 02/11/22 1442 02/11/22 1618 02/11/22 1626  ?BP:  110/89 117/74 (!) 129/49  ?Pulse:  88 94 91  ?Resp:  17 16 18   ?Temp:   99.6 ?F (37.6 ?C) 98.2 ?F (36.8 ?C)  ?TempSrc:   Oral Oral  ?SpO2:  99% 97% 96%  ?Weight: 117.9 kg   117.9 kg  ?Height: 5\' 7"  (1.702 m)   5\' 7"  (1.702 m)  ? ? ?Constitutional: Elderly male currently NAD, calm, comfortable ?Eyes: PERRL, lids and conjunctivae normal ?ENMT: Mucous membranes are moist. Posterior pharynx clear of any exudate or lesions. ?Neck: normal, supple, no masses, no thyromegaly ?Respiratory: Normal respiratory effort without wheezes or rhonchi appreciated currently on 2 L of nasal cannula oxygen in the PACU ?Cardiovascular: Regular rate and rhythm, no murmurs / rubs / gallops. No extremity edema. 2+ pedal pulses.  ?Abdomen: no tenderness, no masses palpated. No hepatosplenomegaly. Bowel sounds positive.  ?Musculoskeletal: no clubbing / cyanosis.  ?Skin: Bandage currently present on the right thumb currently bleeding ?Neurologic: CN 2-12 grossly intact. Sensation intact, DTR normal.  Strength 5/5 in all 4.  ?Psychiatric: Normal judgment and insight. Alert and oriented x 3. Normal mood.  ? ?Data Reviewed: ? ?Results reviewed as noted above in HPI ? ?Assessment and Plan: ?* Tenosynovitis of finger

## 2022-02-11 NOTE — ED Triage Notes (Addendum)
Pt reports pain in the left knee x 1 1/2 months. States he needs a knee replacement.  ? ?Pt reports pain in the right hand travels right arm x 3 days. 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 Pt reports pain, swelling. States he started vomiting today due to pain.  ?

## 2022-02-11 NOTE — Assessment & Plan Note (Addendum)
S/p I&D by Dr. Soundra Pilon on 3/22 ?Right hand still has severe numbness in the thumb, index finger, middle finger, as well as very weak flexing no change. ?MRI hand unremarkable ?- Ortho follow up after discharge ?- OT eval ?

## 2022-02-11 NOTE — ED Provider Notes (Signed)
?[place] ? ? ?MRN: [id number] DOB: February 21, 1951 ? ?Subjective:  ? ?Erik Hancock is a 71 y.o. male presenting for 3-day history of acute onset persistent and worsening right hand pain, right finger pain and swelling that radiates into the right forearm.  Patient has also had fevers, 10 out of 10 pain, multiple episodes of vomiting from the pain overnight and today.  He believes that these injuries he sustained to his hand happened from getting stuck with hay while working outdoors.  He is also had severe left knee pain.  Has a history of significant arthritis that is in need of a knee replacement.  He also has heart disease, is a type II diabetic treated with insulin. ? ?No current facility-administered medications for this encounter. ? ?Current Outpatient Medications:  ?  ACCU-CHEK AVIVA PLUS test strip, USE STRIP TO CHECK GLUCOSE TWICE DAILY TO THREE TIMES DAILY **NEED OFFICE VISIT FOR REFILLS**, Disp: 100 each, Rfl: 0 ?  aspirin 81 MG tablet, Take 81 mg by mouth daily., Disp: , Rfl:  ?  Continuous Blood Gluc Sensor (FREESTYLE LIBRE 2 SENSOR) MISC, 1 Device by Does not apply route every 14 (fourteen) days., Disp: 6 each, Rfl: 3 ?  Insulin Aspart FlexPen 100 UNIT/ML SOPN, Inject 30 Units into the skin 2 (two) times daily with a meal., Disp: 60 mL, Rfl: 3 ?  insulin glargine (LANTUS SOLOSTAR) 100 UNIT/ML Solostar Pen, Inject 15 Units into the skin at bedtime., Disp: 30 mL, Rfl: 2 ?  Insulin Pen Needle (BD ULTRA-FINE PEN NEEDLES) 29G X 12.7MM MISC, 1 Device by Other route in the morning, at noon, in the evening, and at bedtime., Disp: 360 each, Rfl: 3 ?  JARDIANCE 25 MG TABS tablet, Take 1 tablet by mouth once daily, Disp: 90 tablet, Rfl: 0 ?  liraglutide (VICTOZA) 18 MG/3ML SOPN, Inject 1.8 mg into the skin daily., Disp: 18 mL, Rfl: 3 ?  nitroGLYCERIN (NITROLINGUAL) 0.4 MG/SPRAY spray, Place 1 spray under the tongue every 5 (five) minutes x 3 doses as needed for chest pain., Disp: 12 g, Rfl: 12 ?   pantoprazole (PROTONIX) 40 MG tablet, Take 1 tablet (40 mg total) by mouth daily., Disp: 90 tablet, Rfl: 3 ?  rosuvastatin (CRESTOR) 10 MG tablet, Take 1 tablet (10 mg total) by mouth daily., Disp: 90 tablet, Rfl: 3 ?  ticagrelor (BRILINTA) 60 MG TABS tablet, Take 1 tablet (60 mg total) by mouth 2 (two) times daily., Disp: 180 tablet, Rfl: 2 ?  valsartan (DIOVAN) 160 MG tablet, Take 1 tablet (160 mg total) by mouth daily., Disp: 90 tablet, Rfl: 1  ? ?Allergies  ?Allergen Reactions  ? Beta Adrenergic Blockers   ?  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  ? ? ?Past Medical History:  ?Diagnosis Date  ? Arthritis   ? "all over" (07/29/2018)  ? CAD S/P percutaneous coronary angioplasty   ? a. stent to mLAD 11/2001. b. DES to PDA/mLCx 2007. c. DES to ostial LAD 07/2018.  ? Chronic lower back pain   ? CKD (chronic kidney disease), stage III ([place])   ? CKD (chronic kidney disease), stage III ([place])   ? Diabetic retinopathy ([place])   ? Diabetic retinopathy ([place])   ? GERD (gastroesophageal reflux disease)   ? High cholesterol   ? History of kidney stones   ? Hypertension   ? Mobitz type 2 second degree atrioventricular block   ? a. noted during 07/2018 adm, metoprolol discontinued.  ?  Morbid obesity ([place]) 07/28/2018  ? Prostatitis   ? Pulmonary nodule 2013  ? CT  ? Sleep apnea   ? stopbang=5  ? Suspected sleep apnea   ? Type II diabetes mellitus ([place])   ?  ? ?Past Surgical History:  ?Procedure Laterality Date  ? CARDIAC CATHETERIZATION  JUNE 2003  ? PATENT LAD STENT/ BODERLINE OBSTRUCTIVE DISEASE POSTERIOR DESCENDING ARTERY/ NORMAL LVF  ? CATARACT EXTRACTION W/ INTRAOCULAR LENS  IMPLANT, BILATERAL Bilateral 2017  ? CERVICAL SCOVILLE FORAMINOTOMY W/ EXCISION OF HERNIATED [place] PULPOSUS  2009  ? C6 - 7  ? CORONARY ANGIOPLASTY WITH STENT PLACEMENT  03/30/2006  ? DR EDMUNDS - 90% LCx@OM2  TAXUS  DES 3.0 X 20 -> 3.25 mm,   95% RPDA -- TAXUS DES 3.0 X 16 --> 3.5 mm  ? CORONARY ANGIOPLASTY WITH STENT PLACEMENT  11/2001  ?  (Dr. Ilda Foil for Dr. Radford Pax) - mLAD@D2  - TAXUS EXPRESS DES 3.5 x 15   ? CORONARY ANGIOPLASTY WITH STENT PLACEMENT  07/29/2018  ? CORONARY STENT INTERVENTION N/A 07/29/2018  ? Procedure: CORONARY STENT INTERVENTION;  Surgeon: Leonie Man, MD;  Location: [place] [facility];  Service: Cardiovascular;  Laterality: N/A;  ? CYSTOSCOPY W/ RETROGRADES  05/04/2012  ? Procedure: CYSTOSCOPY WITH RETROGRADE PYELOGRAM;  Surgeon: Molli Hazard, MD;  Location: [facility];  Service: Urology;  Laterality: Left;  ? CYSTOSCOPY W/ URETERAL STENT PLACEMENT  05/04/2012  ? Procedure: CYSTOSCOPY WITH STENT REPLACEMENT;  Surgeon: Molli Hazard, MD;  Location: [facility];  Service: Urology;  Laterality: Left;  ? CYSTOSCOPY WITH STENT PLACEMENT Left 04/04/2012  ? CYSTOSCOPY WITH URETEROSCOPY  05/04/2012  ? Procedure: CYSTOSCOPY WITH URETEROSCOPY;  Surgeon: Molli Hazard, MD;  [facility]: [facility];  Service: Urology;  Laterality: Left;   ? ?  ? LEFT HEART CATH AND CORONARY ANGIOGRAPHY N/A 07/29/2018  ? Procedure: LEFT HEART CATH AND CORONARY ANGIOGRAPHY;  Surgeon: Leonie Man, MD;  Location: [place] [facility];  Service: Cardiovascular;  Laterality: N/A;  ? LEFT URETEROSCOPIC STONE EXTRACTION  03-14-2003  ? X2  ? ? ?Family History  ?Problem Relation Age of Onset  ? Hypertension Mother   ? Heart attack Father   ? Heart disease Father   ? Alzheimer's disease Father   ? Diabetes Father   ? Heart attack Paternal Grandfather   ? Heart disease Paternal Grandfather   ? Lupus Sister   ? ? ?Social History  ? ?Tobacco Use  ? Smoking status: Never  ? Smokeless tobacco: Never  ?Vaping Use  ? Vaping Use: Never used  ?Substance Use Topics  ? Alcohol use: Never  ? Drug use: Never  ? ? ?ROS ? ? ?Objective:  ? ?Vitals: ?BP (!) 151/69 (BP Location: Right Arm)   Pulse (!) 101   Temp 99.2 ?F (37.3 ?C) (Oral)   Resp (!) 22   SpO2 95%  ? ?Physical Exam ?Constitutional:   ?   General: He is not in acute distress. ?   Appearance: Normal appearance.  He is well-developed. He is not ill-appearing, toxic-appearing or diaphoretic.  ?HENT:  ?   Head: Normocephalic and atraumatic.  ?   Right Ear: External ear normal.  ?   Left Ear: External ear normal.  ?   Nose: Nose normal.  ?   Mouth/Throat:  ?   Mouth: Mucous membranes are moist.  ?Eyes:  ?   General: No scleral icterus.    ?   Right eye: No discharge.     ?  Left eye: No discharge.  ?   Extraocular Movements: Extraocular movements intact.  ?Cardiovascular:  ?   Rate and Rhythm: Normal rate and regular rhythm.  ?   Heart sounds: Normal heart sounds. No murmur heard. ?  No friction rub. No gallop.  ?Pulmonary:  ?   Effort: Pulmonary effort is normal. No respiratory distress.  ?   Breath sounds: Normal breath sounds. No stridor. No wheezing, rhonchi or rales.  ?Musculoskeletal:  ?     Hands: ? ?Neurological:  ?   Mental Status: He is alert and oriented to person, place, and time.  ?Psychiatric:     ?   Mood and Affect: Mood normal.     ?   Behavior: Behavior normal.     ?   Thought Content: Thought content normal.  ? ? ? ? ? ? ? ? ? ?Results for orders placed or performed during the hospital encounter of 02/11/22 (from the past 24 hour(s))  ?POCT CBG (manual entry)     Status: Abnormal  ? Collection Time: 02/11/22 10:59 AM  ?Result Value Ref Range  ? POCT Glucose ([facility]) 207 (A) 70 - 99 mg/dL  ? ?DG Hand Complete Right ? ?Result Date: 02/11/2022 ?CLINICAL DATA:  Right hand pain and swelling. Rule out osteomyelitis. EXAM: RIGHT HAND - COMPLETE 3+ VIEW COMPARISON:  None. FINDINGS: Of note, the image quality on lateral view is degraded by patient motion artifact. Cortical irregularity at the base of the thumb distal phalanx without lucent fracture line. No evidence of acute fracture elsewhere in the hand. No cortical erosions. No subcutaneous emphysema. Moderate osteoarthritis of the STT joint and thumb CMC joint. Vascular calcifications throughout the right distal forearm and hand. Tiny metallic foreign body in the  volar soft tissues of the index finger at the level of the PIP joint. IMPRESSION: 1. No radiographic findings to suggest osteomyelitis. Of note, MRI has increased sensitivity for detection of osteomyeliti

## 2022-02-11 NOTE — Assessment & Plan Note (Addendum)
Cr over last 8 years seems to be between 1.1-1.7, today down to 1.1 ?- Continue ARB ?

## 2022-02-11 NOTE — ED Triage Notes (Signed)
Pt arrived POV from home c/o a finger injury that happened 3 days ago. Pt was carrying a bail of hay and caught his left thumb on something that stabbed him. Pt's hand is swollen, red and painful. Pt also endorses some N/V. ?

## 2022-02-11 NOTE — Assessment & Plan Note (Addendum)
Continue Protonix °

## 2022-02-11 NOTE — Consult Note (Addendum)
Reason for Consult:Right hand pain ?Referring Physician: Wynona Dove ?Time called: [id number] ?Time at bedside: 1513 ? ? ?Erik Hancock is an 71 y.o. male.  ?HPI: Erik Hancock was working with hay over the weekend and thinks he got a piece embedded in his thumb even though he was wearing gloves. It began to hurt Monday and by last night it was very painful. He went to UC this morning and then was sent to the ED for further evaluation. He denies fevers, chills, sweats but has had some N/V. He is RHD. ? ?Past Medical History:  ?Diagnosis Date  ? Arthritis   ? "all over" (07/29/2018)  ? CAD S/P percutaneous coronary angioplasty   ? a. stent to mLAD 11/2001. b. DES to PDA/mLCx 2007. c. DES to ostial LAD 07/2018.  ? Chronic lower back pain   ? CKD (chronic kidney disease), stage III ([place])   ? CKD (chronic kidney disease), stage III ([place])   ? Diabetic retinopathy ([place])   ? Diabetic retinopathy ([place])   ? GERD (gastroesophageal reflux disease)   ? High cholesterol   ? History of kidney stones   ? Hypertension   ? Mobitz type 2 second degree atrioventricular block   ? a. noted during 07/2018 adm, metoprolol discontinued.  ? Morbid obesity ([place]) 07/28/2018  ? Prostatitis   ? Pulmonary nodule 2013  ? CT  ? Sleep apnea   ? stopbang=5  ? Suspected sleep apnea   ? Type II diabetes mellitus ([place])   ? ? ?Past Surgical History:  ?Procedure Laterality Date  ? CARDIAC CATHETERIZATION  JUNE 2003  ? PATENT LAD STENT/ BODERLINE OBSTRUCTIVE DISEASE POSTERIOR DESCENDING ARTERY/ NORMAL LVF  ? CATARACT EXTRACTION W/ INTRAOCULAR LENS  IMPLANT, BILATERAL Bilateral 2017  ? CERVICAL [place]OVILLE FORAMINOTOMY W/ EXCISION OF HERNIATED [place] PULPOSUS  2009  ? C6 - 7  ? CORONARY ANGIOPLASTY WITH STENT PLACEMENT  03/30/2006  ? DR EDMUNDS - 90% LCx@OM2  TAXUS  DES 3.0 X 20 -> 3.25 mm,   95% RPDA -- TAXUS DES 3.0 X 16 --> 3.5 mm  ? CORONARY ANGIOPLASTY WITH STENT PLACEMENT  11/2001  ? (Dr. Ilda Foil for Dr. Radford Pax) - mLAD@D2  - TAXUS EXPRESS DES 3.5 x 15   ? CORONARY ANGIOPLASTY  WITH STENT PLACEMENT  07/29/2018  ? CORONARY STENT INTERVENTION N/A 07/29/2018  ? Procedure: CORONARY STENT INTERVENTION;  Surgeon: Leonie Man, MD;  Location: [facility];  Service: Cardiovascular;  Laterality: N/A;  ? CYSTOSCOPY W/ RETROGRADES  05/04/2012  ? Procedure: CYSTOSCOPY WITH RETROGRADE PYELOGRAM;  Surgeon: Molli Hazard, MD;  Location: [facility];  Service: Urology;  Laterality: Left;  ? CYSTOSCOPY W/ URETERAL STENT PLACEMENT  05/04/2012  ? Procedure: CYSTOSCOPY WITH STENT REPLACEMENT;  Surgeon: Molli Hazard, MD;  Location: [facility];  Service: Urology;  Laterality: Left;  ? CYSTOSCOPY WITH STENT PLACEMENT Left 04/04/2012  ? CYSTOSCOPY WITH URETEROSCOPY  05/04/2012  ? Procedure: CYSTOSCOPY WITH URETEROSCOPY;  Surgeon: Molli Hazard, MD;  Location: [facility];  Service: Urology;  Laterality: Left;   ? ?  ? LEFT HEART CATH AND CORONARY ANGIOGRAPHY N/A 07/29/2018  ? Procedure: LEFT HEART CATH AND CORONARY ANGIOGRAPHY;  Surgeon: Leonie Man, MD;  Location: [facility];  Service: Cardiovascular;  Laterality: N/A;  ? LEFT URETEROSCOPIC STONE EXTRACTION  03-14-2003  ? X2  ? ? ?Family History  ?Problem Relation Age of Onset  ? Hypertension Mother   ? Heart attack Father   ? Heart disease Father   ?  Alzheimer's disease Father   ? Diabetes Father   ? Heart attack Paternal Grandfather   ? Heart disease Paternal Grandfather   ? Lupus Sister   ? ? ?Social History:  reports that he has never smoked. He has never used smokeless tobacco. He reports that he does not drink alcohol and does not use drugs. ? ?Allergies:  ?Allergies  ?Allergen Reactions  ? Beta Adrenergic Blockers   ?  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  ? ? ?Medications: I have reviewed the patient's current medications. ? ?Results for orders placed or performed during the hospital encounter of 02/11/22 (from the past 48 hour(s))  ?Lactic acid, plasma     Status: None  ? Collection Time: 02/11/22   1:29 PM  ?Result Value Ref Range  ? Lactic Acid, Venous 1.6 0.5 - 1.9 mmol/L  ?  Comment: Performed at [facility], [address], [place], [place] [address]  ?Comprehensive metabolic panel     Status: Abnormal  ? Collection Time: 02/11/22  1:29 PM  ?Result Value Ref Range  ? Sodium 135 135 - 145 mmol/L  ? Potassium 4.2 3.5 - 5.1 mmol/L  ? Chloride 103 98 - 111 mmol/L  ? CO2 12 (L) 22 - 32 mmol/L  ? Glucose, Bld 213 (H) 70 - 99 mg/dL  ?  Comment: Glucose reference range applies only to samples taken after fasting for at least 8 hours.  ? BUN 14 8 - 23 mg/dL  ? Creatinine, Ser 1.61 (H) 0.61 - 1.24 mg/dL  ? Calcium 9.1 8.9 - 10.3 mg/dL  ? Total Protein 7.3 6.5 - 8.1 g/dL  ? Albumin 3.4 (L) 3.5 - 5.0 g/dL  ? AST 23 15 - 41 U/L  ? ALT 26 0 - 44 U/L  ? Alkaline Phosphatase 93 38 - 126 U/L  ? Total Bilirubin 1.9 (H) 0.3 - 1.2 mg/dL  ? GFR, Estimated 46 (L) >60 mL/min  ?  Comment: (NOTE) ?Calculated using the CKD-EPI Creatinine Equation (2021) ?  ? Anion gap 20 (H) 5 - 15  ?  Comment: Performed at [facility], [address], [place], [place] [address]  ?CBC with Differential     Status: Abnormal  ? Collection Time: 02/11/22  1:29 PM  ?Result Value Ref Range  ? WBC 19.3 (H) 4.0 - 10.5 K/uL  ? RBC 4.95 4.22 - 5.81 MIL/uL  ? Hemoglobin 14.1 13.0 - 17.0 g/dL  ? HCT 44.8 39.0 - 52.0 %  ? MCV 90.5 80.0 - 100.0 fL  ? MCH 28.5 26.0 - 34.0 pg  ? MCHC 31.5 30.0 - 36.0 g/dL  ? RDW 15.6 (H) 11.5 - 15.5 %  ? Platelets 271 150 - 400 K/uL  ? nRBC 0.0 0.0 - 0.2 %  ? Neutrophils Relative % 93 %  ? Neutro Abs 17.9 (H) 1.7 - 7.7 K/uL  ? Lymphocytes Relative 2 %  ? Lymphs Abs 0.4 (L) 0.7 - 4.0 K/uL  ? Monocytes Relative 4 %  ? Monocytes Absolute 0.8 0.1 - 1.0 K/uL  ? Eosinophils Relative 0 %  ? Eosinophils Absolute 0.0 0.0 - 0.5 K/uL  ? Basophils Relative 0 %  ? Basophils Absolute 0.0 0.0 - 0.1 K/uL  ? Immature Granulocytes 1 %  ? Abs Immature Granulocytes 0.14 (H) 0.00 - 0.07 K/uL  ?  Comment: Performed at [facility]  [facility], [address], [place], [place] [address]  ? ? ?DG Hand  Complete Right ? ?Result Date: 02/11/2022 ?CLINICAL DATA:  Right hand pain and swelling. Rule out osteomyelitis. EXAM: RIGHT HAND - COMPLETE 3+ VIEW COMPARISON:  None. FINDINGS: Of note, the image quality on lateral view is degraded by patient motion artifact. Cortical irregularity at the base of the thumb distal phalanx without lucent fracture line. No evidence of acute fracture elsewhere in the hand. No cortical erosions. No subcutaneous emphysema. Moderate osteoarthritis of the STT joint and thumb CMC joint. Vascular calcifications throughout the right distal forearm and hand. Tiny metallic foreign body in the volar soft tissues of the index finger at the level of the PIP joint. IMPRESSION: 1. No radiographic findings to suggest osteomyelitis. Of note, MRI has increased sensitivity for detection of osteomyelitis compared to radiographs. 2. Cortical irregularity at the base of the thumb distal phalanx, suggestive of sequela of prior trauma. No evidence of acute fracture in the right hand. 3. Tiny metallic foreign body in the volar soft tissues of the index finger. Electronically Signed   By: Ileana Roup M.D.   On: 02/11/2022 11:27   ? ?Review of Systems  ?HENT:  Negative for ear discharge, ear pain, hearing loss and tinnitus.   ?Eyes:  Negative for photophobia and pain.  ?Respiratory:  Negative for cough and shortness of breath.   ?Cardiovascular:  Negative for chest pain.  ?Gastrointestinal:  Negative for abdominal pain, nausea and vomiting.  ?Genitourinary:  Negative for dysuria, flank pain, frequency and urgency.  ?Musculoskeletal:  Positive for arthralgias (Right hand, esp thumb). Negative for back pain, myalgias and neck pain.  ?Neurological:  Negative for dizziness and headaches.  ?Hematological:  Does not bruise/bleed easily.  ?Psychiatric/Behavioral:  The patient is not nervous/anxious.   ?Blood pressure 110/89, pulse 88, temperature (!) 100.4 ?F (38  ?C), temperature source Oral, resp. rate 17, height 5\' 7"  (1.702 m), weight 117.9 kg, SpO2 99 %. ?Physical Exam ?Constitutional:   ?   General: He is not in acute distress. ?   Appearance: He is well-

## 2022-02-11 NOTE — Assessment & Plan Note (Addendum)
BP elevated slightly ?- Continue home valsartan ?

## 2022-02-11 NOTE — Progress Notes (Signed)
Pharmacy Antibiotic Note ? ?Erik Hancock is a 71 y.o. male admitted on 02/11/2022 with  flexor tenosynovitis .  Pharmacy has been consulted for unasyn and vancomycin dosing. ? ?SCr 1.61 - above baseline ?WBC 19.3; LA 1.6; T 100.4 F; HR 88; RR 17 ? ?Plan: ?Unasyn 3g q6h ?Vancomycin 2250 mg q48hr (eAUC 508) unless change in renal function ?Trend WBC, Fever, Renal function, & Clinical course ?F/u cultures, clinical course, WBC, fever ?De-escalate when able ?Levels at steady state ? ?Height: 5\' 7"  (170.2 cm) ?Weight: 117.9 kg (260 lb) ?IBW/kg (Calculated) : 66.1 ? ?Temp (24hrs), Avg:99.8 ?F (37.7 ?C), Min:99.2 ?F (37.3 ?C), Max:100.4 ?F (38 ?C) ? ?Recent Labs  ?Lab 02/11/22 ?1329  ?WBC 19.3*  ?CREATININE 1.61*  ?LATICACIDVEN 1.6  ?  ?Estimated Creatinine Clearance: 52.4 mL/min (A) (by C-G formula based on SCr of 1.61 mg/dL (H)).   ? ?Allergies  ?Allergen Reactions  ? Beta Adrenergic Blockers   ?  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  ? ? ?Antimicrobials this admission: ?unasyn 3/22 >>  ?vancomycin 3/22 >>  ? ?Microbiology results: ?Pending ? ?Thank you for allowing pharmacy to be a part of this patient?s care. ? ?4/22, PharmD, BCPS ?02/11/2022 2:38 PM ?ED Clinical Pharmacist -  [phone] ? ? ?

## 2022-02-11 NOTE — Anesthesia Procedure Notes (Signed)
Procedure Name: Intubation ?Date/Time: 02/11/2022 5:13 PM ?Performed by: Janene Harvey, CRNA ?Pre-anesthesia Checklist: Patient identified, Emergency Drugs available, Suction available and Patient being monitored ?Patient Re-evaluated:Patient Re-evaluated prior to induction ?Oxygen Delivery Method: Circle system utilized ?Preoxygenation: Pre-oxygenation with 100% oxygen ?Induction Type: IV induction, Rapid sequence and Cricoid Pressure applied ?Laryngoscope Size: Mac and 4 ?Grade View: Grade II ?Tube type: Oral ?Tube size: 7.5 mm ?Number of attempts: 1 ?Airway Equipment and Method: Stylet and Oral airway ?Placement Confirmation: ETT inserted through vocal cords under direct vision, positive ETCO2 and breath sounds checked- equal and bilateral ?Secured at: 23 cm ?Tube secured with: Tape ?Dental Injury: Teeth and Oropharynx as per pre-operative assessment  ? ? ? ? ?

## 2022-02-11 NOTE — ED Provider Triage Note (Signed)
Emergency Medicine Provider Triage Evaluation Note ? ?Erik Hancock , a 71 y.o. male  was evaluated in triage.  Pt complains of right hand pain and possible infection.  Patient states that over the weekend he was working in his yard and cut his hand.  He presented to urgent care today the provider had concern for infection. ? ?X-ray performed earlier today reviewed by myself that showed no radiographic findings to suggest osteomyelitis.  Case was discussed with on-call orthopedic Earney Hamburg  PA, he encouraged the patient to come to the emergency room with concern for severe infection requiring surgical drainage versus sepsis. Patient is at higher risk due to diabetes.  He was given 1000 mg Tylenol in clinic for fever and pain. ? ?Review of Systems  ?Positive: Right hand pain, fever, nausea, vomiting ?Negative: Numbness, tingling ? ?Physical Exam  ?BP (!) 111/97 (BP Location: Right Arm)   Pulse 89   Temp (!) 100.4 ?F (38 ?C) (Oral)   Resp 18   Ht 5\' 7"  (1.702 m)   Wt 117.9 kg   SpO2 97%   BMI 40.72 kg/m?  ?Gen:   Awake, no distress   ?Resp:  Normal effort  ?MSK:   Moves extremities without difficulty  ?Other:  Multiple wounds of the right hand. Wound over palmar aspect of right thumb appears infected with necrosis and purulence. + nail involvement ? ?Medical Decision Making  ?Medically screening exam initiated at 1:20 PM.  Appropriate orders placed.  was informed that the remainder of the evaluation will be completed by another provider, this initial triage assessment does not replace that evaluation, and the importance of remaining in the ED until their evaluation is complete. ? ? ?  ?Erik Newport, PA-C ?02/11/22 1323 ? ?

## 2022-02-11 NOTE — Transfer of Care (Signed)
Immediate Anesthesia Transfer of Care Note ? ?Patient: Erik Hancock ? ?Procedure(s) Performed: IRRIGATION AND DEBRIDEMENT  OF HAND AND FOREARM (Right) ? ?Patient Location: PACU ? ?Anesthesia Type:General ? ?Level of Consciousness: drowsy and patient cooperative ? ?Airway & Oxygen Therapy: Patient Spontanous Breathing and Patient connected to face mask oxygen ? ?Post-op Assessment: Report given to RN and Post -op Vital signs reviewed and stable ? ?Post vital signs: Reviewed and stable ? ?Last Vitals:  ?Vitals Value Taken Time  ?BP 121/57 02/11/22 1806  ?Temp 37.4 ?C 02/11/22 1805  ?Pulse 94 02/11/22 1818  ?Resp 23 02/11/22 1818  ?SpO2 91 % 02/11/22 1818  ?Vitals shown include unvalidated device data. ? ?Last Pain:  ?Vitals:  ? 02/11/22 1805  ?TempSrc:   ?PainSc: Asleep  ?   ? ?  ? ?Complications: No notable events documented. ?

## 2022-02-12 ENCOUNTER — Inpatient Hospital Stay (HOSPITAL_COMMUNITY): Payer: Medicare PPO

## 2022-02-12 ENCOUNTER — Encounter (HOSPITAL_COMMUNITY): Payer: Self-pay | Admitting: Orthopedic Surgery

## 2022-02-12 ENCOUNTER — Ambulatory Visit: Payer: Self-pay

## 2022-02-12 DIAGNOSIS — R7881 Bacteremia: Secondary | ICD-10-CM | POA: Diagnosis not present

## 2022-02-12 DIAGNOSIS — E1165 Type 2 diabetes mellitus with hyperglycemia: Secondary | ICD-10-CM | POA: Diagnosis not present

## 2022-02-12 DIAGNOSIS — G4733 Obstructive sleep apnea (adult) (pediatric): Secondary | ICD-10-CM

## 2022-02-12 DIAGNOSIS — A400 Sepsis due to streptococcus, group A: Secondary | ICD-10-CM | POA: Diagnosis not present

## 2022-02-12 DIAGNOSIS — I2510 Atherosclerotic heart disease of native coronary artery without angina pectoris: Secondary | ICD-10-CM | POA: Diagnosis not present

## 2022-02-12 DIAGNOSIS — I739 Peripheral vascular disease, unspecified: Secondary | ICD-10-CM

## 2022-02-12 DIAGNOSIS — M659 Synovitis and tenosynovitis, unspecified: Secondary | ICD-10-CM | POA: Diagnosis not present

## 2022-02-12 DIAGNOSIS — I10 Essential (primary) hypertension: Secondary | ICD-10-CM | POA: Diagnosis not present

## 2022-02-12 LAB — BLOOD CULTURE ID PANEL (REFLEXED) - BCID2

## 2022-02-12 LAB — CBC
HCT: 37.8 % — ABNORMAL LOW (ref 39.0–52.0)
Hemoglobin: 11.7 g/dL — ABNORMAL LOW (ref 13.0–17.0)
MCH: 28.4 pg (ref 26.0–34.0)
MCHC: 31 g/dL (ref 30.0–36.0)
MCV: 91.7 fL (ref 80.0–100.0)
Platelets: 234 10*3/uL (ref 150–400)
RBC: 4.12 MIL/uL — ABNORMAL LOW (ref 4.22–5.81)
RDW: 15.8 % — ABNORMAL HIGH (ref 11.5–15.5)
WBC: 24.6 10*3/uL — ABNORMAL HIGH (ref 4.0–10.5)
nRBC: 0 % (ref 0.0–0.2)

## 2022-02-12 LAB — SURGICAL PCR SCREEN
MRSA, PCR: NEGATIVE
Staphylococcus aureus: NEGATIVE

## 2022-02-12 LAB — ECHOCARDIOGRAM COMPLETE
AR max vel: 2.58 cm2
AV Peak grad: 8.9 mmHg
Ao pk vel: 1.49 m/s
Area-P 1/2: 6.37 cm2
Calc EF: 59.3 %
Height: 67 in
S' Lateral: 3.3 cm
Single Plane A2C EF: 57.6 %
Single Plane A4C EF: 59 %
Weight: 4160 oz

## 2022-02-12 LAB — BASIC METABOLIC PANEL
Anion gap: 15 (ref 5–15)
BUN: 17 mg/dL (ref 8–23)
CO2: 15 mmol/L — ABNORMAL LOW (ref 22–32)
Calcium: 8.3 mg/dL — ABNORMAL LOW (ref 8.9–10.3)
Chloride: 106 mmol/L (ref 98–111)
Creatinine, Ser: 1.77 mg/dL — ABNORMAL HIGH (ref 0.61–1.24)
GFR, Estimated: 41 mL/min — ABNORMAL LOW (ref >60)
Glucose, Bld: 217 mg/dL — ABNORMAL HIGH (ref 70–99)
Potassium: 4.9 mmol/L (ref 3.5–5.1)
Sodium: 136 mmol/L (ref 135–145)

## 2022-02-12 LAB — GLUCOSE, CAPILLARY
Glucose-Capillary: 227 mg/dL — ABNORMAL HIGH (ref 70–99)
Glucose-Capillary: 207 mg/dL — ABNORMAL HIGH (ref 70–99)
Glucose-Capillary: 267 mg/dL — ABNORMAL HIGH (ref 70–99)
Glucose-Capillary: 234 mg/dL — ABNORMAL HIGH (ref 70–99)
Glucose-Capillary: 259 mg/dL — ABNORMAL HIGH (ref 70–99)

## 2022-02-12 LAB — HIV ANTIBODY (ROUTINE TESTING W REFLEX): HIV Screen 4th Generation wRfx: NONREACTIVE

## 2022-02-12 MED ORDER — PERFLUTREN LIPID MICROSPHERE
1.0000 mL | INTRAVENOUS | Status: AC | PRN
  Administered 2022-02-12: 2 mL via INTRAVENOUS
  Filled 2022-02-12: qty 10

## 2022-02-12 MED ORDER — PENICILLIN G POTASSIUM 20000000 UNITS IJ SOLR
4.0000 10*6.[IU] | INTRAVENOUS | Status: DC
  Filled 2022-02-12 (×4): qty 4

## 2022-02-12 MED ORDER — PENICILLIN G POTASSIUM 20000000 UNITS IJ SOLR
8.0000 10*6.[IU] | Freq: Two times a day (BID) | INTRAVENOUS | Status: DC
  Administered 2022-02-12 – 2022-02-16 (×8): 8 10*6.[IU] via INTRAVENOUS
  Filled 2022-02-12 (×10): qty 8

## 2022-02-12 NOTE — Progress Notes (Signed)
PHARMACY - PHYSICIAN COMMUNICATION ?CRITICAL VALUE ALERT - BLOOD CULTURE IDENTIFICATION (BCID) ? ?Erik Hancock is an 71 y.o. male who presented to [facility] on 02/11/2022 with a chief complaint of right hand pain/swelling/abscess s/p I&D 3/22   ? ?Assessment:   ?1/2 blood cultures growing Streptococcus pyogenes ? ?Name of physician (or Provider) Contacted:  ?Dr. Loleta Books ? ?Current antibiotics: Unasyn and Vancomycin  ? ?Changes to prescribed antibiotics recommended:  ?Change to Penicillin G 4 MU IV q4h  ? ?Results for orders placed or performed during the hospital encounter of 02/11/22  ?Blood Culture ID Panel (Reflexed) (Collected: 02/11/2022  1:38 PM)  ?Result Value Ref Range  ? Enterococcus faecalis NOT DETECTED NOT DETECTED  ? Enterococcus Faecium NOT DETECTED NOT DETECTED  ? Listeria monocytogenes NOT DETECTED NOT DETECTED  ? Staphylococcus species NOT DETECTED NOT DETECTED  ? Staphylococcus aureus (BCID) NOT DETECTED NOT DETECTED  ? Staphylococcus epidermidis NOT DETECTED NOT DETECTED  ? Staphylococcus lugdunensis NOT DETECTED NOT DETECTED  ? Streptococcus species DETECTED (A) NOT DETECTED  ? Streptococcus agalactiae NOT DETECTED NOT DETECTED  ? Streptococcus pneumoniae NOT DETECTED NOT DETECTED  ? Streptococcus pyogenes DETECTED (A) NOT DETECTED  ? A.calcoaceticus-baumannii NOT DETECTED NOT DETECTED  ? Bacteroides fragilis NOT DETECTED NOT DETECTED  ? Enterobacterales NOT DETECTED NOT DETECTED  ? Enterobacter cloacae complex NOT DETECTED NOT DETECTED  ? Escherichia coli NOT DETECTED NOT DETECTED  ? Klebsiella aerogenes NOT DETECTED NOT DETECTED  ? Klebsiella oxytoca NOT DETECTED NOT DETECTED  ? Klebsiella pneumoniae NOT DETECTED NOT DETECTED  ? Proteus species NOT DETECTED NOT DETECTED  ? Salmonella species NOT DETECTED NOT DETECTED  ? Serratia marcescens NOT DETECTED NOT DETECTED  ? Haemophilus influenzae NOT DETECTED NOT DETECTED  ? Neisseria meningitidis NOT DETECTED NOT DETECTED  ? Pseudomonas aeruginosa  NOT DETECTED NOT DETECTED  ? Stenotrophomonas maltophilia NOT DETECTED NOT DETECTED  ? Candida albicans NOT DETECTED NOT DETECTED  ? Candida auris NOT DETECTED NOT DETECTED  ? Candida glabrata NOT DETECTED NOT DETECTED  ? Candida krusei NOT DETECTED NOT DETECTED  ? Candida parapsilosis NOT DETECTED NOT DETECTED  ? Candida tropicalis NOT DETECTED NOT DETECTED  ? Cryptococcus neoformans/gattii NOT DETECTED NOT DETECTED  ? ? ?[name], [name] ?02/12/2022  6:48 AM ? ?

## 2022-02-12 NOTE — Assessment & Plan Note (Addendum)
Refused CPAP

## 2022-02-12 NOTE — Anesthesia Postprocedure Evaluation (Signed)
Anesthesia Post Note ? ?Patient: Erik Hancock ? ?Procedure(s) Performed: IRRIGATION AND DEBRIDEMENT  OF HAND AND FOREARM (Right) ? ?  ? ?Patient location during evaluation: PACU ?Anesthesia Type: General ?Level of consciousness: sedated and patient cooperative ?Pain management: pain level controlled ?Vital Signs Assessment: post-procedure vital signs reviewed and stable ?Respiratory status: spontaneous breathing ?Cardiovascular status: stable ?Anesthetic complications: no ?Comments: Pt had significant drop in BP and HR with EKG changes noted in OR with deflation of tourniquet just prior to emergence. This was quickly and appropriately treated and pt recovered quickly and was extubated without issue. In PACU, BP and HR appropriate, and patient denied chest pain, SOB, nausea or other anginal equivalents. EKG done and no significant change from prior EKG. Discussed event with [name]. Patient going to telemetry. ? ? ?No notable events documented. ? ?Last Vitals:  ?Vitals:  ? 02/12/22 0113 02/12/22 0534  ?BP: 113/68 127/60  ?Pulse: 91 87  ?Resp: 17 18  ?Temp: 36.9 ?C 36.7 ?C  ?SpO2: 96% 97%  ?  ?Last Pain:  ?Vitals:  ? 02/12/22 0534  ?TempSrc: Oral  ?PainSc:   ? ? ?  ?  ?  ?  ?  ?  ? ?Reuven Hildebrand ? ? ? ? ?

## 2022-02-12 NOTE — Progress Notes (Signed)
VASCULAR LAB ? ? ? ?ABIs have been performed. ? ?See CV proc for preliminary results. ? ? ?Rochell Mabie, RVT ?02/12/2022, 4:00 PM ? ?

## 2022-02-12 NOTE — TOC Initial Note (Signed)
Transition of Care (TOC) - Initial/Assessment Note  ? ? ?Patient Details  ?Name: Erik Hancock ?MRN: [id number] ?Date of Birth: May 20, 1951 ? ?Transition of Care (TOC) CM/SW Contact:    ?Verdell Carmine, RN ?Phone Number: ?02/12/2022, 8:45 AM ? ?Clinical Narrative:                 ? ?The Transition of Care Department [facility]) has reviewed patient and no TOC needs have been identified at this time. We will continue to monitor patient advancement through interdisciplinary progression rounds. If new patient transition needs arise, please place a TOC consult ?  ?  ? ? ?Patient Goals and CMS Choice ?  ?  ?  ? ?Expected Discharge Plan and Services ?  ?  ?  ?  ?  ?                ?  ?  ?  ?  ?  ?  ?  ?  ?  ?  ? ?Prior Living Arrangements/Services ?  ?  ?  ?       ?  ?  ?  ?  ? ?Activities of Daily Living ?Home Assistive Devices/Equipment: None ?ADL Screening (condition at time of admission) ?Patient's cognitive ability adequate to safely complete daily activities?: Yes ?Is the patient deaf or have difficulty hearing?: No ?Does the patient have difficulty seeing, even when wearing glasses/contacts?: No ?Does the patient have difficulty concentrating, remembering, or making decisions?: No ?Patient able to express need for assistance with ADLs?: Yes ?Does the patient have difficulty dressing or bathing?: No ?Independently performs ADLs?: Yes (appropriate for developmental age) ?Does the patient have difficulty walking or climbing stairs?: No ?Weakness of Legs: Left ?Weakness of Arms/Hands: Right ? ?Permission Sought/Granted ?  ?  ?   ?   ?   ?   ? ?Emotional Assessment ?  ?  ?  ?  ?  ?  ? ?Admission diagnosis:  Puncture wound of right hand with infection [S61.431A, L08.9] ?Patient Active Problem List  ? Diagnosis Date Noted  ? OSA (obstructive sleep apnea) 02/12/2022  ? Stage 3a chronic kidney disease (CKD) ([place]) 02/11/2022  ? Tenosynovitis of finger 02/11/2022  ? Metabolic acidosis, increased anion gap 02/11/2022  ? Sepsis due  to group A Streptococcus ([place]) 02/11/2022  ? First degree heart block 08/11/2018  ? Mobitz type 2 second degree atrioventricular block 07/30/2018  ? Angina, class II ([place]) 07/29/2018  ? Morbid obesity ([place]) 07/28/2018  ? Exertional dyspnea 07/28/2018  ? Apnea 07/28/2018  ? Diabetic retinopathy ([place]) 12/24/2016  ? Controlled type 2 diabetes mellitus with hyperglycemia, without long-term current use of insulin ([place]) 02/24/2011  ? Essential hypertension 02/24/2011  ? Dyslipidemia, goal LDL below 70 02/24/2011  ? CAD s/p PCI 2019 02/24/2011  ? GERD (gastroesophageal reflux disease) 02/24/2011  ? ?PCP:  [name], MD ?Pharmacy:   ?[facility] [address] ?[place] ?[place] [address] ?Phone: [phone] Fax: [phone] ? ?[id number] [place], [place] - [address] ?[address] ?[place] [address] ?Phone: [phone] Fax: [phone] ? ? ? ? ?Social Determinants of Health (SDOH) Interventions ?  ? ?Readmission Risk Interventions ?   ? View : No data to display.  ?  ?  ?  ? ? ? ?

## 2022-02-12 NOTE — Progress Notes (Signed)
Inpatient Diabetes Program Recommendations ? ?AACE/ADA: New Consensus Statement on Inpatient Glycemic Control (2015) ? ?Target Ranges:  Prepandial:   less than 140 mg/dL ?     Peak postprandial:   less than 180 mg/dL (1-2 hours) ?     Critically ill patients:  140 - 180 mg/dL  ? ?Lab Results  ?Component Value Date  ? GLUCAP 267 (H) 02/12/2022  ? HGBA1C 8.4 (H) 02/11/2022  ? ? ?Review of Glycemic Control ? Latest Reference Range & Units 02/11/22 18:06 02/11/22 19:36 02/11/22 21:42 02/11/22 22:34 02/12/22 07:57 02/12/22 08:52 02/12/22 12:12  ?Glucose-Capillary 70 - 99 mg/dL 237 (H) 183 (H) 185 (H) 208 (H) 227 (H) 207 (H) 267 (H)  ? ?Diabetes history: DM 2  ?Outpatient Diabetes medications:  ?Victoza 1.8 mg daily, Jardiance 25 mg q AM ?Lantus 15 units q HS ?Novolog 30 units bid with meals ?Current orders for Inpatient glycemic control:  ?Novolog resistant tid with meals ?Semglee 15 units q HS ? ?Inpatient Diabetes Program Recommendations:   ? ?Please consider adding Novolog  meal coverage 3 units tid with meals (hold if patient eats less than 50% or NPO).  Agree with holding Jardiance.  ? ?Thanks,  ?Adah Perl, RN, BC-ADM ?Inpatient Diabetes Coordinator ?Pager [phone]  (8a-5p) ? ?

## 2022-02-12 NOTE — Consult Note (Addendum)
I have seen and examined the patient. I have personally reviewed the clinical findings, laboratory findings, microbiological data and imaging studies. The assessment and treatment plan was discussed with the Nurse Practitioner Rexene Alberts. I agree with her/his recommendations except following additions/corrections.  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 He is planned for partial knee replacement of left knee.   Xray rt hand with no evidence of osteomyelitis   S/p I and D on 3/22. OR cx Strep pyogenes 3/22  blood cx 1/2 sets GPC. BCID as strep pyogenes  No signs of septic left knee RT thumb and hand bandaged with swelling/tenderness +  Switch abtx to continuous Pen G Repeat 2 sets of blood cx TTE  Fu Ortho plans /cultures  Following   Odette Fraction, MD Infectious Disease Physician [facility] [address] [place], [place] [address] Phone: [phone]  Fax: [phone]   [facility]    Date of Admission:  02/11/2022     Total days of antibiotics 2  Vancomycin 3/22 >> 3/23  Unasyn 3/22 >> 3/23  Penicillin 3/23 >> c              Reason for Consult: strep pyogenes bacteremia, hand infection     Referring Provider: Maryfrances Bunnell  Primary Care Provider: Donita Brooks, MD   Assessment: Erik Hancock is a 71 y.o. male with history of uncontrolled diabetes (last A1C>8%) here for care after he sustained an injury to his right thumb while working with hay bales. Hand did not improve with local wound care at home and progressed to be quite swollen and painful for him which prompted ER visit 2 days after the injury. He was taken to the OR for urgent debridement of his right thumb - found to have abscess along tendon sheath that has been washed out. Complicated by strep pyogenes bacteremia and gram positive cocci growing on  gram stain from intraop cultures.  Narrow to penicillin IV. Can do continuous infusions to reduce frequency of bags.  Will check TTE to ensure no changes but group A strep not high risk for endocarditis.  Repeat blood cultures for clearance.   Hopeful to get him on linezolid PO to continue treatment - will work with pharmacy team to review meds to ensure no drug interactions and help with cost analysis.  Plt count 234K. Suspect he will need 2-3 weeks of antibiotics. He will have wound check with ortho on Friday to ensure no further clean up needed.    Plan: TTE Repeat blood cultures Change PCN infusion to continuous pump Follow Friday for wound check and further ortho plans    Principal Problem:   Sepsis due to group A Streptococcus (HCC) Active Problems:   Controlled type 2 diabetes mellitus with hyperglycemia, without long-term current use of insulin (HCC)   Essential hypertension   CAD s/p PCI 2019   Morbid obesity (HCC)   Stage 3a chronic kidney disease (CKD) (HCC)   Tenosynovitis of finger   Metabolic acidosis, increased anion gap   OSA (obstructive sleep apnea)    aspirin  81 mg Oral Daily   enoxaparin (LOVENOX) injection  40 mg Subcutaneous Q24H   insulin aspart  0-20 Units Subcutaneous TID WC   insulin glargine-yfgn  15 Units Subcutaneous QHS   pantoprazole  40 mg Oral Daily   rosuvastatin  10 mg Oral Daily   ticagrelor  60 mg Oral BID    HPI: Erik Hancock is a 71 y.o. male admitted for management of infection of the right hand and fevers.   PMHx of DM, HTN, CAD (s/p most recent PCI 2019), OSA (rx for CPAP but does not regularly use), CKD 3a.   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 Did not think much of it as he always tears up his hands doing this kind of work. He developed quick onset swelling and pain of the whole thumb/hand shortly after injury on Monday and came to hospital on 3/22  when it did not get better.   In the ER he was found to have a high fever to 104 F, leukocytosis, positive blood cultures for group a streptotoccus. He was taken to OR on 3/22 PM by Dr. Orlan Leavens for urgent I&D. Right thumb nail plate with small abscess over the dorsal aspect of the thumb, larger amount of purulence encountered within the septae of the thumb extending down the flexor sheath - fluid within the flexor sheath was purulent and sent for cultures (GPCs on stain). Preserved bone/joints from op note read. XRays also normal appearing bone.   Placed on empiric regimen of vancomycin + unasyn for treatment.  He states he is feeling better but still with swelling of the hand.   Of note he is currently pending a left partial knee replacement.    Review of Systems: Review of Systems  Constitutional:  Positive for fever. Negative for chills.  Respiratory:  Negative for cough and shortness of breath.   Cardiovascular:  Negative for chest pain and leg swelling.  Gastrointestinal:  Positive for nausea. Negative for abdominal pain, diarrhea and vomiting.  Genitourinary:  Negative for dysuria and hematuria.  Musculoskeletal:  Positive for joint pain (right thumb/hand).  Skin:  Negative for rash.  Neurological:  Negative for dizziness and weakness.   Past Medical History:  Diagnosis Date   Arthritis    "all over" (07/29/2018)   CAD S/P percutaneous coronary angioplasty    a. stent to mLAD 11/2001. b. DES to PDA/mLCx 2007. c. DES to ostial LAD 07/2018.   Chronic lower back pain    CKD (chronic kidney disease), stage III (HCC)    CKD (chronic kidney disease), stage III (HCC)    Diabetic retinopathy (HCC)    Diabetic retinopathy (HCC)    GERD (gastroesophageal reflux disease)    High cholesterol    History of kidney stones    Hypertension    Mobitz type 2 second degree atrioventricular block    a. noted during 07/2018 adm, metoprolol discontinued.   Morbid obesity (HCC) 07/28/2018   Prostatitis     Pulmonary nodule 2013   CT   Sleep apnea    stopbang=5   Suspected sleep apnea    Type II diabetes mellitus (HCC)    Past Surgical History:  Procedure Laterality Date   CARDIAC CATHETERIZATION  JUNE 2003   PATENT LAD STENT/ BODERLINE OBSTRUCTIVE DISEASE POSTERIOR DESCENDING ARTERY/ NORMAL LVF   CATARACT EXTRACTION W/ INTRAOCULAR LENS  IMPLANT, BILATERAL Bilateral 2017   CERVICAL SCOVILLE FORAMINOTOMY W/ EXCISION OF HERNIATED NUCLEC PULPOSUS  2009   C6 - 7   CORONARY ANGIOPLASTY WITH STENT PLACEMENT  03/30/2006   DR EDMUNDS - 90% LCx@OM2  TAXUS  DES 3.0 X 20 -> 3.25 mm,   95% RPDA -- TAXUS  DES 3.0 X 16 --> 3.5 mm   CORONARY ANGIOPLASTY WITH STENT PLACEMENT  11/2001   (Dr. Ty Hilts for Dr. Mayford Knife) - mLAD@D2  - TAXUS EXPRESS DES 3.5 x 15    CORONARY ANGIOPLASTY WITH STENT PLACEMENT  07/29/2018   CORONARY STENT INTERVENTION N/A 07/29/2018   Procedure: CORONARY STENT INTERVENTION;  Surgeon: Marykay Lex, MD;  [facility] INVASIVE CV LAB;  Service: Cardiovascular;  Laterality: N/A;   CYSTOSCOPY W/ RETROGRADES  05/04/2012   Procedure: CYSTOSCOPY WITH RETROGRADE PYELOGRAM;  Surgeon: Milford Cage, MD;  Location: [facility] ORS;  Service: Urology;  Laterality: Left;   CYSTOSCOPY W/ URETERAL STENT PLACEMENT  05/04/2012   Procedure: CYSTOSCOPY WITH STENT REPLACEMENT;  Surgeon: Milford Cage, MD;  Location: [facility] ORS;  Service: Urology;  Laterality: Left;   CYSTOSCOPY WITH STENT PLACEMENT Left 04/04/2012   CYSTOSCOPY WITH URETEROSCOPY  05/04/2012   Procedure: CYSTOSCOPY WITH URETEROSCOPY;  Surgeon: Milford Cage, MD;  Location: [facility] ORS;  Service: Urology;  Laterality: Left;       I & D EXTREMITY Right 02/11/2022   Procedure: IRRIGATION AND DEBRIDEMENT  OF HAND AND FOREARM;  Surgeon: Bradly Bienenstock, MD;  Location: [facility] OR;  Service: Orthopedics;  Laterality: Right;   LEFT HEART CATH AND CORONARY ANGIOGRAPHY N/A 07/29/2018   Procedure: LEFT HEART CATH AND CORONARY ANGIOGRAPHY;  Surgeon:  Marykay Lex, MD;  Location: [facility] INVASIVE CV LAB;  Service: Cardiovascular;  Laterality: N/A;   LEFT URETEROSCOPIC STONE EXTRACTION  03-14-2003   X2    Social History   Tobacco Use   Smoking status: Never   Smokeless tobacco: Never  Vaping Use   Vaping Use: Never used  Substance Use Topics   Alcohol use: Never   Drug use: Never    Family History  Problem Relation Age of Onset   Hypertension Mother    Heart attack Father    Heart disease Father    Alzheimer's disease Father    Diabetes Father    Heart attack Paternal Grandfather    Heart disease Paternal Grandfather    Lupus Sister    Allergies  Allergen Reactions   Beta Adrenergic Blockers     Metoprolol stopped 07/2018 due to 2nd degree type 2 AV block.    OBJECTIVE: Blood pressure (!) 113/59, pulse 82, temperature 98.3 F (36.8 C), temperature source Oral, resp. rate 19, height 5\' 7"  (1.702 m), weight 117.9 kg, SpO2 98 %.  Physical Exam Constitutional:      Appearance: Normal appearance.     Comments: Resting quietly in bed. Wife at the bedside.   HENT:     Mouth/Throat:     Mouth: Mucous membranes are moist.     Pharynx: Oropharynx is clear.  Eyes:     General: No scleral icterus.    Pupils: Pupils are equal, round, and reactive to light.  Cardiovascular:     Rate and Rhythm: Normal rate and regular rhythm.     Pulses: Normal pulses.  Pulmonary:     Effort: Pulmonary effort is normal.     Breath sounds: Normal breath sounds.  Abdominal:     General: Bowel sounds are normal. There is no distension.     Palpations: Abdomen is soft.     Tenderness: There is no abdominal tenderness.  Musculoskeletal:        General: Swelling (right hand with redness over dorsal hand) present.     Comments: T thumb is wrapped in surgical dressing. Clean and dry.  Skin:  General: Skin is warm and dry.     Capillary Refill: Capillary refill takes less than 2 seconds.     Findings: Erythema (dorsum of right hand)  present.  Neurological:     General: No focal deficit present.     Mental Status: He is alert and oriented to person, place, and time.    Lab Results Lab Results  Component Value Date   WBC 24.6 (H) 02/12/2022   HGB 11.7 (L) 02/12/2022   HCT 37.8 (L) 02/12/2022   MCV 91.7 02/12/2022   PLT 234 02/12/2022    Lab Results  Component Value Date   CREATININE 1.77 (H) 02/12/2022   BUN 17 02/12/2022   NA 136 02/12/2022   K 4.9 02/12/2022   CL 106 02/12/2022   CO2 15 (L) 02/12/2022    Lab Results  Component Value Date   ALT 26 02/11/2022   AST 23 02/11/2022   ALKPHOS 93 02/11/2022   BILITOT 1.9 (H) 02/11/2022     Microbiology: Recent Results (from the past 240 hour(s))  Blood culture (routine x 2)     Status: None (Preliminary result)   Collection Time: 02/11/22  1:29 PM   Specimen: BLOOD LEFT HAND  Result Value Ref Range Status   Specimen Description BLOOD LEFT HAND  Final   Special Requests   Final    BOTTLES DRAWN AEROBIC AND ANAEROBIC Blood Culture adequate volume   Culture   Final    NO GROWTH < 24 HOURS Performed at [facility], [address] [place], [place] [address]    Report Status PENDING  Incomplete  Blood culture (routine x 2)     Status: None (Preliminary result)   Collection Time: 02/11/22  1:38 PM   Specimen: BLOOD RIGHT WRIST  Result Value Ref Range Status   Specimen Description BLOOD RIGHT WRIST  Final   Special Requests   Final    BOTTLES DRAWN AEROBIC AND ANAEROBIC Blood Culture adequate volume   Culture  Setup Time   Final    GRAM POSITIVE COCCI ANAEROBIC BOTTLE ONLY CRITICAL RESULT CALLED TO, READ BACK BY AND VERIFIED WITH: PHARMD GREG ABBOTT 02/12/22@6 :47 BY TW    Culture   Final    NO GROWTH < 24 HOURS Performed at [facility], [address], [place], [place] [address]    Report Status PENDING  Incomplete  Blood Culture ID Panel (Reflexed)     Status: Abnormal   Collection Time: 02/11/22  1:38 PM  Result Value Ref Range  Status   Enterococcus faecalis NOT DETECTED NOT DETECTED Final   Enterococcus Faecium NOT DETECTED NOT DETECTED Final   Listeria monocytogenes NOT DETECTED NOT DETECTED Final   Staphylococcus species NOT DETECTED NOT DETECTED Final   Staphylococcus aureus (BCID) NOT DETECTED NOT DETECTED Final   Staphylococcus epidermidis NOT DETECTED NOT DETECTED Final   Staphylococcus lugdunensis NOT DETECTED NOT DETECTED Final   Streptococcus species DETECTED (A) NOT DETECTED Final    Comment: CRITICAL RESULT CALLED TO, READ BACK BY AND VERIFIED WITH: PHARMD GREG ABBOTT 02/12/22@6 :47 BY TW    Streptococcus agalactiae NOT DETECTED NOT DETECTED Final   Streptococcus pneumoniae NOT DETECTED NOT DETECTED Final   Streptococcus pyogenes DETECTED (A) NOT DETECTED Final    Comment: CRITICAL RESULT CALLED TO, READ BACK BY AND VERIFIED WITH: PHARMD GREG ABBOTT 02/12/22@6 :47 BY TW    A.calcoaceticus-baumannii NOT DETECTED NOT DETECTED Final   Bacteroides fragilis NOT DETECTED NOT DETECTED Final   Enterobacterales NOT DETECTED  NOT DETECTED Final   Enterobacter cloacae complex NOT DETECTED NOT DETECTED Final   Escherichia coli NOT DETECTED NOT DETECTED Final   Klebsiella aerogenes NOT DETECTED NOT DETECTED Final   Klebsiella oxytoca NOT DETECTED NOT DETECTED Final   Klebsiella pneumoniae NOT DETECTED NOT DETECTED Final   Proteus species NOT DETECTED NOT DETECTED Final   Salmonella species NOT DETECTED NOT DETECTED Final   Serratia marcescens NOT DETECTED NOT DETECTED Final   Haemophilus influenzae NOT DETECTED NOT DETECTED Final   Neisseria meningitidis NOT DETECTED NOT DETECTED Final   Pseudomonas aeruginosa NOT DETECTED NOT DETECTED Final   Stenotrophomonas maltophilia NOT DETECTED NOT DETECTED Final   Candida albicans NOT DETECTED NOT DETECTED Final   Candida auris NOT DETECTED NOT DETECTED Final   Candida glabrata NOT DETECTED NOT DETECTED Final   Candida krusei NOT DETECTED NOT DETECTED Final   Candida  parapsilosis NOT DETECTED NOT DETECTED Final   Candida tropicalis NOT DETECTED NOT DETECTED Final   Cryptococcus neoformans/gattii NOT DETECTED NOT DETECTED Final    Comment: Performed at [facility], [address] [place], [place] [address]  Aerobic/Anaerobic Culture w Gram Stain (surgical/deep wound)     Status: None (Preliminary result)   Collection Time: 02/11/22  5:33 PM   Specimen: Wound  Result Value Ref Range Status   Specimen Description WOUND  Final   Special Requests RIGHT HAND  Final   Gram Stain   Final    RARE WBC PRESENT,BOTH PMN AND MONONUCLEAR MODERATE GRAM POSITIVE COCCI    Culture   Final    CULTURE REINCUBATED FOR BETTER GROWTH Performed at [facility], [address] [place], [place] [address]    Report Status PENDING  Incomplete   Imaging DG Hand Complete Right  Result Date: 02/11/2022 CLINICAL DATA:  Right hand pain and swelling. Rule out osteomyelitis. EXAM: RIGHT HAND - COMPLETE 3+ VIEW COMPARISON:  None. FINDINGS: Of note, the image quality on lateral view is degraded by patient motion artifact. Cortical irregularity at the base of the thumb distal phalanx without lucent fracture line. No evidence of acute fracture elsewhere in the hand. No cortical erosions. No subcutaneous emphysema. Moderate osteoarthritis of the STT joint and thumb CMC joint. Vascular calcifications throughout the right distal forearm and hand. Tiny metallic foreign body in the volar soft tissues of the index finger at the level of the PIP joint. IMPRESSION: 1. No radiographic findings to suggest osteomyelitis. Of note, MRI has increased sensitivity for detection of osteomyelitis compared to radiographs. 2. Cortical irregularity at the base of the thumb distal phalanx, suggestive of sequela of prior trauma. No evidence of acute fracture in the right hand. 3. Tiny metallic foreign body in the volar soft tissues of the index finger. Electronically Signed   By: Sherron Ales M.D.    On: 02/11/2022 11:27     Rexene Alberts, MSN, NP-C [facility]  [email or web address] Pager: [phone] Office: [phone] [facility] Main Line: [phone] *Secure Chat Communication Welcome

## 2022-02-12 NOTE — Progress Notes (Signed)
?Progress Note ? ? ?Patient: Erik Hancock [id number] DOB: Feb 09, 1951 DOA: 02/11/2022     1 ?DOS: the patient was seen and examined on 02/12/2022 ?  ? ? ? ? ?Brief hospital course: ?Mr. Bogda is a 71 y.o. M with DM, HTN, CAD last PCI 2019, MO, OSA on CPAP and CKD IIIa, baseline Cr 1.1-1.4 who presented with right hand pain and swelling. ? ?Recently moving a hay bale and got puncture wound.  This started to puff up, then he got chills, nausea and vomiting to he went to [facility] and was sent to the ER. ? ? ? ?3/22: Radiograph without osteo, taken to OR by [name] had debridement of purulent tenosynovitis of the thumb; intraop cultures sent ?3/23: Blood cultures growing GAS in 1/2, narrowed to penicillin ? ? ? ? ? ? ?Assessment and Plan: ?* Sepsis due to group A Streptococcus ([place]) ?P/w fever, RR>22, leukocytosis and now bacteremia. ? ?Source is right hand puncture wound.  Got Tdap in ER.  ? ?Now culture growing GAS in blood, intraop culture pending. On appropriate abx.   ? ?-Continue IV antibiotics, narrow to Pen G ?- Consult ID for strep bacteremia  ? ? ? ? ? ?Tenosynovitis of finger ?S/p I&D by Dr. Soundra Pilon on 3/22 ?-Continue antibiotics, see above ?- Keep dressing in place until Friday ortho f/u ?- Outpatient follow upw with Dr. Soundra Pilon ? ? ?OSA (obstructive sleep apnea) ?- CPAP at night ? ?Metabolic acidosis, increased anion gap ?Due to Jardiance  ?-Hold Jardiance ? ?Stage 3a chronic kidney disease (CKD) ([place]) ?Cr over last 8 years seems to be between 1.1-1.7 ?- Stop IV fluids and trend ? ?Morbid obesity ([place]) ?BMI 40.72 kg/m2 ? ?GERD (gastroesophageal reflux disease) ?-Continue Protonix ? ?CAD s/p PCI 2019 ?-Continue aspirin, Crestor ?- Resume brilinta ? ?Dyslipidemia, goal LDL below 70 ?-Continue Crestor ? ?Essential hypertension ?BP normal ?- Hole home valsartan ? ?Controlled type 2 diabetes mellitus with hyperglycemia, without long-term current use of insulin ([place]) ?A1c 8.4%, relatively well contrlled for  Age. ? ?Glucoses elevated yesteday, improved today ?-Hold Jardiance while sick ?-Hold Victoza  ?- Continue glargine ?-Continue SS corrections  ? ? ? ? ?  ? ?Subjective: Patient is feeling well.  He is got some swelling and pain in his right hand which seems to be better from yesterday.  No confusion, fever overnight, no vomiting, no chest pain, no other focal pains. ? ?Physical Exam: ?Vitals:  ? 02/11/22 2021 02/12/22 0113 02/12/22 0534 02/12/22 0847  ?BP: (!) 143/69 113/68 127/60 (!) 113/59  ?Pulse: (!) 102 91 87 82  ?Resp: 18 17 18 19   ?Temp: 98.6 ?F (37 ?C) 98.5 ?F (36.9 ?C) 98 ?F (36.7 ?C) 98.3 ?F (36.8 ?C)  ?TempSrc: Oral Oral Oral Oral  ?SpO2: 98% 96% 97% 98%  ?Weight: 117.9 kg     ?Height: 5\' 7"  (1.702 m)     ? ?Obese adult male, lying in bed, interactive, appropriate ?RRR, no murmurs, no lower extremity edema, he has some swelling in the right hand, where his infection is, JVP not visible ?Respiratory rate normal, lungs clear without rales or wheezes ?Attention normal, affect appropriate, speech fluent, face symmetric, judgment and insight appear normal, moves upper extremities with normal strength and coordination ? ? ? ? ? ? ?Data Reviewed: ?Discussed with infectious disease, nursing notes reviewed, pharmacy notes reviewed, vital signs reviewed.  Past notes reviewed. ?Labs and imaging are notable for elevated glucose ?Creatinine slightly up to 1.77 ?Blood cultures growing group A  strep and blood, wound culture growing gram-positive cocci, not yet speciated ?Reports cell count up to 24,000, hemoglobin slightly down to 11 ? ? ?Family Communication: Wife at the bedside ? ?Disposition: ?Status is: Inpatient ?Remains inpatient appropriate because: He has streptococcal bacteremia, will require ID consultation for agent, duration and route of antibiotic therapy ? ?Hopefully home within hte next few days if all planning by ID completed ? ? ? Planned Discharge Destination: Home ? ? ? ? ?Author: ?[name], MD ?02/12/2022 9:48 AM ? ?For on call review [email or web address].  ?

## 2022-02-12 NOTE — Progress Notes (Signed)
PT awake and alert on RA. PT refusing Cpap. No resp distress noted. ?

## 2022-02-12 NOTE — Hospital Course (Addendum)
Erik Hancock is a 71 y.o. M with DM, HTN, CAD last PCI 2019, MO, OSA on CPAP and CKD IIIa, baseline Cr 1.1-1.4 who presented with right hand pain and swelling. ? ?Recently moving a hay bale and got puncture wound.  This started to puff up, then he got chills, nausea and vomiting to he went to [facility] and was sent to the ER. ? ? ? ?3/22: Radiograph without osteo, taken to OR by [name] had debridement of purulent tenosynovitis of the thumb; intraop cultures sent ?3/23: Blood cultures growing GAS in 1/2, narrowed to penicillin, echo normal ?3/24: Persistent hand swelling, poor movement ?3/26: No change in pain or swelling, MRI normal ?

## 2022-02-13 ENCOUNTER — Ambulatory Visit: Payer: Medicare PPO | Admitting: Endocrinology

## 2022-02-13 DIAGNOSIS — A400 Sepsis due to streptococcus, group A: Secondary | ICD-10-CM | POA: Diagnosis not present

## 2022-02-13 DIAGNOSIS — M659 Synovitis and tenosynovitis, unspecified: Secondary | ICD-10-CM | POA: Diagnosis not present

## 2022-02-13 DIAGNOSIS — I10 Essential (primary) hypertension: Secondary | ICD-10-CM | POA: Diagnosis not present

## 2022-02-13 DIAGNOSIS — D72829 Elevated white blood cell count, unspecified: Secondary | ICD-10-CM

## 2022-02-13 DIAGNOSIS — M7989 Other specified soft tissue disorders: Secondary | ICD-10-CM

## 2022-02-13 DIAGNOSIS — I2510 Atherosclerotic heart disease of native coronary artery without angina pectoris: Secondary | ICD-10-CM | POA: Diagnosis not present

## 2022-02-13 DIAGNOSIS — E1165 Type 2 diabetes mellitus with hyperglycemia: Secondary | ICD-10-CM | POA: Diagnosis not present

## 2022-02-13 DIAGNOSIS — R7881 Bacteremia: Secondary | ICD-10-CM | POA: Diagnosis not present

## 2022-02-13 LAB — BASIC METABOLIC PANEL
Anion gap: 10 (ref 5–15)
BUN: 27 mg/dL — ABNORMAL HIGH (ref 8–23)
CO2: 20 mmol/L — ABNORMAL LOW (ref 22–32)
Calcium: 8.2 mg/dL — ABNORMAL LOW (ref 8.9–10.3)
Chloride: 105 mmol/L (ref 98–111)
Creatinine, Ser: 1.46 mg/dL — ABNORMAL HIGH (ref 0.61–1.24)
GFR, Estimated: 51 mL/min — ABNORMAL LOW (ref >60)
Glucose, Bld: 264 mg/dL — ABNORMAL HIGH (ref 70–99)
Potassium: 3.7 mmol/L (ref 3.5–5.1)
Sodium: 135 mmol/L (ref 135–145)

## 2022-02-13 LAB — CBC
HCT: 34.5 % — ABNORMAL LOW (ref 39.0–52.0)
Hemoglobin: 11.3 g/dL — ABNORMAL LOW (ref 13.0–17.0)
MCH: 28.5 pg (ref 26.0–34.0)
MCHC: 32.8 g/dL (ref 30.0–36.0)
MCV: 86.9 fL (ref 80.0–100.0)
Platelets: 234 10*3/uL (ref 150–400)
RBC: 3.97 MIL/uL — ABNORMAL LOW (ref 4.22–5.81)
RDW: 15.6 % — ABNORMAL HIGH (ref 11.5–15.5)
WBC: 20.5 10*3/uL — ABNORMAL HIGH (ref 4.0–10.5)
nRBC: 0 % (ref 0.0–0.2)

## 2022-02-13 LAB — GLUCOSE, CAPILLARY
Glucose-Capillary: 226 mg/dL — ABNORMAL HIGH (ref 70–99)
Glucose-Capillary: 223 mg/dL — ABNORMAL HIGH (ref 70–99)
Glucose-Capillary: 241 mg/dL — ABNORMAL HIGH (ref 70–99)
Glucose-Capillary: 254 mg/dL — ABNORMAL HIGH (ref 70–99)

## 2022-02-13 MED ORDER — INSULIN ASPART 100 UNIT/ML IJ SOLN
3.0000 [IU] | Freq: Three times a day (TID) | INTRAMUSCULAR | Status: DC
  Administered 2022-02-13 – 2022-02-15 (×7): 3 [IU] via SUBCUTANEOUS

## 2022-02-13 NOTE — Assessment & Plan Note (Deleted)
He had swelling yesterday within normal limits postop, but today, the hand is still markedly swollen, with much more numbness and immobility of the fingers and I would expect at this point in treatment despite appropriately tailored antibiotics by infectious disease. ?- Discussed with orthopedics, they will reevaluate ?

## 2022-02-13 NOTE — Progress Notes (Signed)
? ?[facility] Infectious Diseases Follow Up Note ? ?Patient Identification: ?Patient Name: Erik Hancock MRN: [id number] [place] Date: 02/11/2022  1:14 PM ?Age: 71 y.o.Today's Date: 02/13/2022 ? ? ?Reason for Visit: Fu on thumb abscess ? ?Principal Problem: ?  Sepsis due to group A Streptococcus ([place]) ?Active Problems: ?  Controlled type 2 diabetes mellitus with hyperglycemia, without long-term current use of insulin ([place]) ?  Essential hypertension ?  CAD s/p PCI 2019 ?  Morbid obesity ([place]) ?  Stage 3a chronic kidney disease (CKD) ([place]) ?  Tenosynovitis of finger ?  Metabolic acidosis, increased anion gap ?  OSA (obstructive sleep apnea) ?  Bacteremia ? ? ?Antibiotics:  ?Vancomycin 3/22 ?Unasyn 3/22, Penicillin 3/23 ? ?Lines/Hardwares:  ? ?Interval Events: afebrile in the last 24 hrs, TTE and ABI findings as below ? ? ?Assessment ?RT  thumb abscess/Flexor pollicis longus tenosynovitis  ?- s/p I and D 3/22. OR cx growing strep pyogenes ? ?2. Low grade strep pyogenes bacteremia 2/2 above ?- TTE 3/23 mild calcification of the AV and mild thickening. Low suspicion for endocarditis ?- Repeat blood cx 3/23 in process  ? ?3. Leukocytosis - down trending  ? ?Recommendations ?Continue Pen G as is  ?Fu repeat blood cx ?He still has significant pain/swelling and tenderness in the rt band, the dressing was slightly cut at the edges by his RN as it seemed to be too tight. ? Ortho re-eval ?Following  ? ?Rest of the management as per the primary team. ?Thank you for the consult. Please page with pertinent questions or concerns. ? ?______________________________________________________________________ ?Subjective ?patient seen and examined at the bedside.  ?Moderate pain in the rt hand++ ? ?Vitals ?BP 134/74 (BP Location: Left Arm)   Pulse 69   Temp 98.4 ?F (36.9 ?C) (Oral)   Resp 18   Ht 5\' 7"  (1.702 m)   Wt 117.9 kg   SpO2 98%   BMI 40.72 kg/m?  ? ?  ?Physical  Exam ?Constitutional:  lying in the bed ?   Comments:  ? ?Cardiovascular:  ?   Rate and Rhythm: Normal rate and regular rhythm.  ?   Heart sounds:  ? ?Pulmonary:  ?   Effort: Pulmonary effort is normal.  ?   Comments:  ? ?Abdominal:  ?   Palpations: Abdomen is soft.  ?   Tenderness: Non distended  ? ?Musculoskeletal:     ?   General:Rt thumb bandaged, has an open wound in the rt middle thumb, has swelling/tenderness, restricted ROM of wrist and fingers ? ?Skin: ?   Comments:  ? ?Neurological:  ?   General: grossly non focal, awake, alert and oriented  ? ?Psychiatric:     ?   Mood and Affect: Mood normal.  ? ?Pertinent Microbiology ?Results for orders placed or performed during the hospital encounter of 02/11/22  ?Surgical PCR Screen     Status: None  ? Collection Time: 02/11/22  1:06 PM  ? Specimen: Nasal Mucosa; Nasal Swab  ?Result Value Ref Range Status  ? MRSA, PCR NEGATIVE NEGATIVE Final  ? Staphylococcus aureus NEGATIVE NEGATIVE Final  ?  Comment: (NOTE) ?The Xpert SA Assay (FDA approved for NASAL specimens in patients 40 ?years of age and older), is one component of a comprehensive ?surveillance program. It is not intended to diagnose infection nor to ?guide or monitor treatment. ?Performed at [facility], [place] [address] [place], [place] ?[address] ?  ?Blood culture (routine x 2)     Status: None (  Preliminary result)  ? Collection Time: 02/11/22  1:29 PM  ? Specimen: BLOOD LEFT HAND  ?Result Value Ref Range Status  ? Specimen Description BLOOD LEFT HAND  Final  ? Special Requests   Final  ?  BOTTLES DRAWN AEROBIC AND ANAEROBIC Blood Culture adequate volume  ? Culture   Final  ?  NO GROWTH 2 DAYS ?Performed at [facility], [place] [address], [place], [place] [address] ?  ? Report Status PENDING  Incomplete  ?Blood culture (routine x 2)     Status: Abnormal (Preliminary result)  ? Collection Time: 02/11/22  1:38 PM  ? Specimen: BLOOD RIGHT WRIST  ?Result Value Ref Range Status  ? Specimen Description  BLOOD RIGHT WRIST  Final  ? Special Requests   Final  ?  BOTTLES DRAWN AEROBIC AND ANAEROBIC Blood Culture adequate volume  ? Culture  Setup Time   Final  ?  GRAM POSITIVE COCCI ?ANAEROBIC BOTTLE ONLY ?CRITICAL RESULT CALLED TO, READ BACK BY AND VERIFIED WITH: ?PHARMD GREG ABBOTT 02/12/22@6 :47 BY TW ?  ? Culture (A)  Final  ?  GROUP A STREP (S.PYOGENES) ISOLATED ?SUSCEPTIBILITIES TO FOLLOW ?Performed at [facility], [place] [address], [place], [place] [address] ?  ? Report Status PENDING  Incomplete  ?Blood Culture ID Panel (Reflexed)     Status: Abnormal  ? Collection Time: 02/11/22  1:38 PM  ?Result Value Ref Range Status  ? Enterococcus faecalis NOT DETECTED NOT DETECTED Final  ? Enterococcus Faecium NOT DETECTED NOT DETECTED Final  ? Listeria monocytogenes NOT DETECTED NOT DETECTED Final  ? Staphylococcus species NOT DETECTED NOT DETECTED Final  ? Staphylococcus aureus (BCID) NOT DETECTED NOT DETECTED Final  ? Staphylococcus epidermidis NOT DETECTED NOT DETECTED Final  ? Staphylococcus lugdunensis NOT DETECTED NOT DETECTED Final  ? Streptococcus species DETECTED (A) NOT DETECTED Final  ?  Comment: CRITICAL RESULT CALLED TO, READ BACK BY AND VERIFIED WITH: ?PHARMD GREG ABBOTT 02/12/22@6 :47 BY TW ?  ? Streptococcus agalactiae NOT DETECTED NOT DETECTED Final  ? Streptococcus pneumoniae NOT DETECTED NOT DETECTED Final  ? Streptococcus pyogenes DETECTED (A) NOT DETECTED Final  ?  Comment: CRITICAL RESULT CALLED TO, READ BACK BY AND VERIFIED WITH: ?PHARMD GREG ABBOTT 02/12/22@6 :47 BY TW ?  ? A.calcoaceticus-baumannii NOT DETECTED NOT DETECTED Final  ? Bacteroides fragilis NOT DETECTED NOT DETECTED Final  ? Enterobacterales NOT DETECTED NOT DETECTED Final  ? Enterobacter cloacae complex NOT DETECTED NOT DETECTED Final  ? Escherichia coli NOT DETECTED NOT DETECTED Final  ? Klebsiella aerogenes NOT DETECTED NOT DETECTED Final  ? Klebsiella oxytoca NOT DETECTED NOT DETECTED Final  ? Klebsiella pneumoniae NOT DETECTED NOT  DETECTED Final  ? Proteus species NOT DETECTED NOT DETECTED Final  ? Salmonella species NOT DETECTED NOT DETECTED Final  ? Serratia marcescens NOT DETECTED NOT DETECTED Final  ? Haemophilus influenzae NOT DETECTED NOT DETECTED Final  ? Neisseria meningitidis NOT DETECTED NOT DETECTED Final  ? Pseudomonas aeruginosa NOT DETECTED NOT DETECTED Final  ? Stenotrophomonas maltophilia NOT DETECTED NOT DETECTED Final  ? Candida albicans NOT DETECTED NOT DETECTED Final  ? Candida auris NOT DETECTED NOT DETECTED Final  ? Candida glabrata NOT DETECTED NOT DETECTED Final  ? Candida krusei NOT DETECTED NOT DETECTED Final  ? Candida parapsilosis NOT DETECTED NOT DETECTED Final  ? Candida tropicalis NOT DETECTED NOT DETECTED Final  ? Cryptococcus neoformans/gattii NOT DETECTED NOT DETECTED Final  ?  Comment: Performed at [facility], [place] [address]  [place], [place] [address]  ?Aerobic/Anaerobic Culture w Gram Stain (surgical/deep wound)     Status: None (Preliminary result)  ? Collection Time: 02/11/22  5:33 PM  ? Specimen: Wound  ?Result Value Ref Range Status  ? Specimen Description WOUND  Final  ? Special Requests RIGHT HAND  Final  ? Gram Stain   Final  ?  RARE WBC PRESENT,BOTH PMN AND MONONUCLEAR ?MODERATE GRAM POSITIVE COCCI ?  ? Culture   Final  ?  MODERATE STREPTOCOCCUS PYOGENES ?Beta hemolytic streptococci are predictably susceptible to penicillin and other beta lactams. Susceptibility testing not routinely performed. ?Performed at [facility], [place] [address], [place], [place] [address] ?  ? Report Status PENDING  Incomplete  ?Culture, blood (routine x 2)     Status: None (Preliminary result)  ? Collection Time: 02/12/22 10:26 AM  ? Specimen: Right Antecubital; Blood  ?Result Value Ref Range Status  ? Specimen Description RIGHT ANTECUBITAL  Final  ? Special Requests   Final  ?  BOTTLES DRAWN AEROBIC AND ANAEROBIC Blood Culture adequate volume  ? Culture   Final  ?  NO GROWTH < 24 HOURS ?Performed at [facility], [place] [address], [place], [place] [address] ?  ? Report Status PENDING  Incomplete  ?Culture, blood (routine x 2)     Status: None (Preliminary result)  ? Collection Time: 02/12/22 10:34 AM  ? Specimen: BLO

## 2022-02-13 NOTE — Progress Notes (Signed)
?Progress Note ? ? ?Patient: Erik Hancock [id number] DOB: 14-Nov-1951 DOA: 02/11/2022     2 ?DOS: the patient was seen and examined on 02/13/2022 ?  ? ? ? ? ?Brief hospital course: ?Mr. Lachance is a 71 y.o. M with DM, HTN, CAD last PCI 2019, MO, OSA on CPAP and CKD IIIa, baseline Cr 1.1-1.4 who presented with right hand pain and swelling. ? ?Recently moving a hay bale and got puncture wound.  This started to puff up, then he got chills, nausea and vomiting to he went to [facility] and was sent to the ER. ? ? ? ?3/22: Radiograph without osteo, taken to OR by [name] had debridement of purulent tenosynovitis of the thumb; intraop cultures sent ?3/23: Blood cultures growing GAS in 1/2, narrowed to penicillin, echo normal ?3/24: Persistent hand swelling, poor movement ? ? ? ? ? ? ?Assessment and Plan: ?* Sepsis due to group A Streptococcus ([place]) ?See summary from yesterday ?Low suspicion for endocarditis ?- Continue penicillin IV ?- Consult ID ?- Follow repeat cultures ? ? ? ? ? ? ?Tenosynovitis of finger ?S/p I&D by Dr. Soundra Pilon on 3/22 ?He had swelling yesterday within normal limits postop, but today, the hand is still markedly swollen, with much more numbness and immobility of the fingers and I would expect at this point in treatment despite appropriately tailored antibiotics by infectious disease. ?- Discussed with orthopedics, they will reevaluate ? ?OSA (obstructive sleep apnea) ?Refused CPAP ? ?Metabolic acidosis, increased anion gap ?Resolved with holding Jardiance and Subq insulin.  This was likely some mild SGLT2i associated diabetic ketoacidosis, now resolved. ? ?Stage 3a chronic kidney disease (CKD) ([place]) ?Cr over last 8 years seems to be between 1.1-1.7, today 1.4 ?-Hold further fluids ? ? ?Morbid obesity ([place]) ?BMI 40.72 kg/m2 ? ?GERD (gastroesophageal reflux disease) ?-Continue Protonix ? ?CAD s/p PCI 2019 ?-Continue aspirin, Crestor, Brilinta ?  ? ?Dyslipidemia, goal LDL below 70 ?-Continue  Crestor ? ?Essential hypertension ?BP normal ?- Hold home valsartan ? ?Controlled type 2 diabetes mellitus with hyperglycemia, without long-term current use of insulin ([place]) ?A1c 8.4%, relatively well contrlled for Age. ? ?Glucose adequately elevated ?-Hold Jardiance while sick ?-Hold Victoza  ?- Continue glargine ?-Continue SS corrections  ?- Increase mealtime aspart ? ? ? ? ?  ? ?Subjective: Still a lot of swelling and numbness in the hand, still unable to move the fingers of the right hand.  No fever, confusion.  No chest pain, malaise, vomiting. ? ?Physical Exam: ?Vitals:  ? 02/12/22 1950 02/13/22 0003 02/13/22 [id number] 02/13/22 0816  ?BP: (!) 120/59 135/70 134/68 134/74  ?Pulse: 73 84 79 69  ?Resp:  18 18 18   ?Temp: 98.9 ?F (37.2 ?C) 99.2 ?F (37.3 ?C) 98.4 ?F (36.9 ?C) 98.4 ?F (36.9 ?C)  ?TempSrc: Oral Oral Oral Oral  ?SpO2: 98% 97% 95% 98%  ?Weight:      ?Height:      ? ? ?Obese adult male, lying in bed, interactive, appropriate, eating breakfast ?RRR, no murmurs, no lower extremity edema ?Respiratory rate normal, lungs clear without rales or wheezes ?The right hand has persistent swelling, although this is somewhat better than yesterday, and yesterday afternoon the wrist bandage  was loosened somewhat, however he still has limited movement of the right fingers, and numbness in most of the right hand ?Attention normal, affect appropriate, speech fluent, face symmetric, judgment and insight normal, moves lower extremities normal strength and coordination ? ? ? ? ? ? ? ?Data Reviewed: ?Discussed with  infectious disease, orthopedics.  Nursing notes reviewed.  Vital signs reviewed. ?Labs and imaging are notable for elevated glucose, ABI with noncompressible vessels ?Echocardiogram with normal EF, concentric hypertrophy, normal valves ?Patient metabolic panel showing improved creatinine, normalized bicarb ?  ? ? ?Family Communication: Wife at the bedside ? ?Disposition: ?Status is: Inpatient ?Remains inpatient  appropriate because:  ?He requires ongoing IV antibiotics.  Once infectious disease have determine the route and duration and agent for antibiotic therapy and we have a PICC line, he will likely be stable for discharge within the next day or 2. ? ?In addition orthopedics will reevaluate his hand today for postop swelling numbness and weakness ? ? ? Planned Discharge Destination: Home ? ? ? ? ?Author: ?[name], MD ?02/13/2022 11:07 AM ? ?For on call review [email or web address].  ?

## 2022-02-13 NOTE — Progress Notes (Signed)
PT refuses cpap. No resp distress noted at this time. Will continue to monitor.  ?

## 2022-02-13 NOTE — TOC Progression Note (Signed)
Transition of Care (TOC) - Progression Note  ? ? ?Patient Details  ?Name: Erik Hancock ?MRN: [id number] ?Date of Birth: 09/22/1951 ? ?Transition of Care (TOC) CM/SW Contact  ?Kingsley Plan, RN ?Phone Number: ?02/13/2022, 11:09 AM ? ?Clinical Narrative:    ? ?TOC team following for possible antibiotic needs at discharge.  ? ? ? ?Transition of Care (TOC) Screening Note ? ? ?Patient Details  ?Name: Erik Hancock ?Date of Birth: 1951/08/24 ? ? ?Transition of Care Department [facility]) has reviewed patient . We will continue to monitor patient advancement through interdisciplinary progression rounds. If determination made on antibiotics please notify TOC team. Thanks   ? ?  ?  ? ?Expected Discharge Plan and Services ?  ?  ?  ?  ?  ?                ?  ?  ?  ?  ?  ?  ?  ?  ?  ?  ? ? ?Social Determinants of Health (SDOH) Interventions ?  ? ?Readmission Risk Interventions ?   ? View : No data to display.  ?  ?  ?  ? ? ?

## 2022-02-14 DIAGNOSIS — D72829 Elevated white blood cell count, unspecified: Secondary | ICD-10-CM

## 2022-02-14 DIAGNOSIS — A400 Sepsis due to streptococcus, group A: Secondary | ICD-10-CM | POA: Diagnosis not present

## 2022-02-14 DIAGNOSIS — M659 Synovitis and tenosynovitis, unspecified: Secondary | ICD-10-CM | POA: Diagnosis not present

## 2022-02-14 LAB — CBC
HCT: 34.4 % — ABNORMAL LOW (ref 39.0–52.0)
Hemoglobin: 11.4 g/dL — ABNORMAL LOW (ref 13.0–17.0)
MCH: 28.4 pg (ref 26.0–34.0)
MCHC: 33.1 g/dL (ref 30.0–36.0)
MCV: 85.8 fL (ref 80.0–100.0)
Platelets: 256 10*3/uL (ref 150–400)
RBC: 4.01 MIL/uL — ABNORMAL LOW (ref 4.22–5.81)
RDW: 15.5 % (ref 11.5–15.5)
WBC: 17.9 10*3/uL — ABNORMAL HIGH (ref 4.0–10.5)
nRBC: 0 % (ref 0.0–0.2)

## 2022-02-14 LAB — BASIC METABOLIC PANEL
Anion gap: 11 (ref 5–15)
BUN: 14 mg/dL (ref 8–23)
CO2: 21 mmol/L — ABNORMAL LOW (ref 22–32)
Calcium: 8.2 mg/dL — ABNORMAL LOW (ref 8.9–10.3)
Chloride: 103 mmol/L (ref 98–111)
Creatinine, Ser: 1.17 mg/dL (ref 0.61–1.24)
GFR, Estimated: >60 mL/min (ref >60)
Glucose, Bld: 251 mg/dL — ABNORMAL HIGH (ref 70–99)
Potassium: 3.7 mmol/L (ref 3.5–5.1)
Sodium: 135 mmol/L (ref 135–145)

## 2022-02-14 LAB — GLUCOSE, CAPILLARY
Glucose-Capillary: 223 mg/dL — ABNORMAL HIGH (ref 70–99)
Glucose-Capillary: 301 mg/dL — ABNORMAL HIGH (ref 70–99)
Glucose-Capillary: 307 mg/dL — ABNORMAL HIGH (ref 70–99)
Glucose-Capillary: 260 mg/dL — ABNORMAL HIGH (ref 70–99)

## 2022-02-14 MED ORDER — LIP MEDEX EX OINT
TOPICAL_OINTMENT | CUTANEOUS | Status: DC | PRN
  Filled 2022-02-14: qty 7

## 2022-02-14 NOTE — Progress Notes (Signed)
PT refusing to wear cpap. No resp distress noted. Will continue to monitor.  ?

## 2022-02-14 NOTE — Progress Notes (Signed)
? ?  Subjective: ? ?Erik Hancock is a 71 y.o. male, 3 Days Post-Op  ?  s/p Procedure(s): ?IRRIGATION AND DEBRIDEMENT  OF HAND AND FOREARM ? ? ?Patient reports pain as mild to moderate.  Does reports swelling and pain in the 3rd and4th digits with swelling. Has some radiation of pain up the wrist. Denies fever or chills. Has slight improvement but still painful.  ? ?Patient has been on Penicillin IV with plan for ID to transition on linezolid OP.  ? ?Objective:  ? ?VITALS:   ?Vitals:  ? 02/13/22 2951 02/13/22 2122 02/14/22 0436 02/14/22 0753  ?BP: 134/74 (!) 170/72 137/61 140/83  ?Pulse: 69 66 71 77  ?Resp: _0 ?Temp: 98.4 ?F (36.9 ?C) 99.4 ?F (37.4 ?C) 99 ?F (37.2 ?C) 98 ?F (36.7 ?C)  ?TempSrc: Oral Oral Oral   ?SpO2: 98% 98% 95% 98%  ?Weight:      ?Height:      ? ? ?Right hand:  ?Swelling of digits and hand, sensation intact in M/R/U distribution but limited sensation around the incision sites at the thumb. Darkening and eschar of the skin along the volar thumb incision concerning for development of skin necrosis, sutures present. No drainage or spreading erythema. Limited motion of all digits secondary to swelling. Radial pulse present.  ? ?Lab Results  ?Component Value Date  ? WBC 17.9 (H) 02/14/2022  ? HGB 11.4 (L) 02/14/2022  ? HCT 34.4 (L) 02/14/2022  ? MCV 85.8 02/14/2022  ? PLT 256 02/14/2022  ? ?BMET ?   ?Component Value Date/Time  ? NA 135 02/14/2022 0451  ? NA 140 08/02/2018 1208  ? K 3.7 02/14/2022 0451  ? CL 103 02/14/2022 0451  ? CO2 21 (L) 02/14/2022 0451  ? GLUCOSE 251 (H) 02/14/2022 0451  ? BUN 14 02/14/2022 0451  ? BUN 13 08/02/2018 1208  ? CREATININE 1.17 02/14/2022 0451  ? CREATININE 1.07 06/26/2021 0921  ? CALCIUM 8.2 (L) 02/14/2022 0451  ? EGFR 75 06/26/2021 0921  ? GFRNONAA >60 02/14/2022 0451  ? GFRNONAA 52 (L) 07/04/2020 1005  ? ? ? ?Assessment/Plan: ?3 Days Post-Op  ? ?Principal Problem: ?  Sepsis due to group A Streptococcus ([place]) ?Active Problems: ?  Controlled type 2 diabetes  mellitus with hyperglycemia, without long-term current use of insulin ([place]) ?  Essential hypertension ?  CAD s/p PCI 2019 ?  Morbid obesity ([place]) ?  Stage 3a chronic kidney disease (CKD) ([place]) ?  Tenosynovitis of finger ?  Metabolic acidosis, increased anion gap ?  OSA (obstructive sleep apnea) ? ? ?Advance diet ? ?Dressing changed today, splint placed back with adaptic, gauze, ace wrap. Looks stable though some skin necrosis noted. Plan to be determined by [name] though likely plan for transition to oral antibiotics once stable and close outpatient follow up for further planning. WBC count improving. Advised to ice rest of the hand elevate and work on motion as able.  ? ?Weightbearing Status: No lifting, motion as tolerated in dressing.  ? ? ? ?Dutch Gray Odis Wickey ?02/14/2022, 10:43 AM ? ?Jonelle Sidle PA-C  ?Physician Assistant with Dr. Lillia Abed [place] ? ?

## 2022-02-14 NOTE — Progress Notes (Signed)
, ?  Progress Note ? ? ?Patient: Erik Hancock [id number] DOB: December 26, 1950 DOA: 02/11/2022     3 ?DOS: the patient was seen and examined on 02/14/2022 ?  ? ? ? ? ?Brief hospital course: ?[name] is a 71 y.o. M with DM, HTN, CAD last PCI 2019, MO, OSA on CPAP and CKD IIIa, baseline Cr 1.1-1.4 who presented with right hand pain and swelling. ? ?Recently moving a hay bale and got puncture wound.  This started to puff up, then he got chills, nausea and vomiting to he went to [facility] and was sent to the ER. ? ? ?  ? ? ? ? ? ?Assessment and Plan: ?* Sepsis due to group A Streptococcus ([facility]) ?-Continue penicillin ?- Consult ID, appreciate cares, optimistic to transition to oral agent at d/c ? ? ? ? ? ? ?Tenosynovitis of finger ?S/p I&D by Dr. Renetta Chalk on 3/22 ?- Consult ID, appreciate cares ?- If still such marked swelling tomorrow, I will discuss MRI with Ortho ?  ?   ?CAD s/p PCI 2019 ?-Continue aspirin, Brilinta, Crestor ?  ?  ? ?Essential hypertension ?BP normal ?- Hold home valsartan ? ?Controlled type 2 diabetes mellitus with hyperglycemia, without long-term current use of insulin ([facility]) ?A1c 8.4%, relatively well contrlled for Age. ? ?Glucose elevated ? ?-Hold Jardiance while sick ?-Hold Victoza  ?- Continue glargine, increase dose ?- Continue SS corrections increase ?- Continue mealtime aspart, increase ? ? ? ? ?  ? ?Subjective: Pain and swelling of the right hand is still pretty severe very limiting movement.  No fever, confusion. ? ?Physical Exam: ?Vitals:  ? 02/13/22 2122 02/14/22 0436 02/14/22 0753 02/14/22 1543  ?BP: (!) 170/72 137/61 140/83 (!) 155/69  ?Pulse: 66 71 77 66  ?Resp: 18 18 17 17   ?Temp: 99.4 ?F (37.4 ?C) 99 ?F (37.2 ?C) 98 ?F (36.7 ?C) 99.2 ?F (37.3 ?C)  ?TempSrc: Oral Oral  Oral  ?SpO2: 98% 95% 98% 97%  ?Weight:      ?Height:      ? ? ?Obese adult male, sitting up in recliner, no obvious distress ?RRR, no murmurs, no lower extremity edema ?Normal respiratory rate and rhythm, lungs clear without rales  or wheezes ?Affect normal, attention appropriate, speech fluent, face symmetric, weak movement in the right hand, swelling still pretty impressive in that hand.    ? ? ? ? ? ? ? ?Data Reviewed: ?Discussed with infectious disease.  Nursing notes reviewed.  Vital signs reviewed. ?Patient metabolic panel and hemogram reviewed.  Culture data ? ? ?Family Communication:   ? ?Disposition: ?Status is: Inpatient ?Remains inpatient appropriate because:  ?He requires ongoing IV antibiotics.  Once infectious disease have determine the route and duration and agent for antibiotic therapy and we have a PICC line, he will likely be stable for discharge within the next day or 2. ? ?In addition orthopedics will reevaluate his hand today for postop swelling numbness and weakness ? ? ? Planned Discharge Destination: Home ? ? ? ? ?Author: ? , MD ?02/14/2022 4:55 PM ? ?For on call review [email or web address].  ?

## 2022-02-14 NOTE — Progress Notes (Signed)
? ?[facility] Infectious Diseases Follow Up Note ? ?Patient Identification: ?Patient Name: Erik Hancock MRN: [id number] [place] Date: 02/11/2022  1:14 PM ?Age: 71 y.o.Today's Date: 02/14/2022 ? ? ?Reason for Visit: Fu on thumb abscess ? ?Principal Problem: ?  Sepsis due to group A Streptococcus ([place]) ?Active Problems: ?  Controlled type 2 diabetes mellitus with hyperglycemia, without long-term current use of insulin ([place]) ?  Essential hypertension ?  CAD s/p PCI 2019 ?  Morbid obesity ([place]) ?  Stage 3a chronic kidney disease (CKD) ([place]) ?  Tenosynovitis of finger ?  Metabolic acidosis, increased anion gap ?  OSA (obstructive sleep apnea) ? ? ?Antibiotics:  ?Vancomycin 3/22 ?Unasyn 3/22, Penicillin 3/23-c ? ?Lines/Hardwares:  ? ?Interval Events: afebrile in the last 24 hrs ? ? ?Assessment ?RT  thumb abscess/Flexor pollicis longus tenosynovitis  ?- s/p I and D 3/22. OR cx growing strep pyogenes ? ?2. Low grade strep pyogenes bacteremia 2/2 above ?- TTE 3/23 mild calcification of the AV and mild thickening. Low suspicion for endocarditis ?- Repeat blood cx 3/23  no growth in 2 days  ? ?3. Leukocytosis - down trending  ? ?Recommendations\ ?Continue Pen G as is. Fu sensitivities  ? ?Still has significant swelling in the rt hand + restricted ROM of his rt fingers. His sensation is reduced in the rt forefinger and middle finger.  Recommend to get a repeat imaging of his rt hand if no improvement by tomorrow.  ? ?Tells me he was seen by surgery yesterday and looked eOK, will follow their note for recs ? ?Plan for approx 3 weeks of abtx from 3/22 on discharge if improvement in clinical exam  ? ?Discussed with patient and Dr Loleta Books ? ?Rest of the management as per the primary team. ?Thank you for the consult. Please page with pertinent questions or concerns. ? ?______________________________________________________________________ ?Subjective ?patient seen and examined at the  bedside.  ?Pain and swelling in the rt hand same ?Decreased sensation in the rt forefinger and middle finger ? ?Vitals ?BP 140/83 (BP Location: Left Arm)   Pulse 77   Temp 98 ?F (36.7 ?C)   Resp 17   Ht 5\' 7"  (1.702 m)   Wt 117.9 kg   SpO2 98%   BMI 40.72 kg/m?  ? ?  ?Physical Exam ?Constitutional:  lying in the bed ?   Comments:  ? ?Cardiovascular:  ?   Rate and Rhythm: Normal rate and regular rhythm.  ?   Heart sounds:  ? ?Pulmonary:  ?   Effort: Pulmonary effort is normal.  ?   Comments:  ? ?Abdominal:  ?   Palpations: Abdomen is soft.  ?   Tenderness: Non distended  ? ?Musculoskeletal:     ?   General:Rt thumb bandaged, has an open wound in the rt middle thumb, has swelling/tenderness, restricted ROM of wrist and fingers ? ?Skin: ?   Comments:  ? ?Neurological:  ?   General: grossly non focal, awake, alert and oriented  ? ?Psychiatric:     ?   Mood and Affect: Mood normal.  ? ?Pertinent Microbiology ?Results for orders placed or performed during the hospital encounter of 02/11/22  ?Surgical PCR Screen     Status: None  ? Collection Time: 02/11/22  1:06 PM  ? Specimen: Nasal Mucosa; Nasal Swab  ?Result Value Ref Range Status  ? MRSA, PCR NEGATIVE NEGATIVE Final  ? Staphylococcus aureus NEGATIVE NEGATIVE Final  ?  Comment: (NOTE) ?The Xpert SA Assay (FDA approved for NASAL  specimens in patients 45 ?years of age and older), is one component of a comprehensive ?surveillance program. It is not intended to diagnose infection nor to ?guide or monitor treatment. ?Performed at [facility], [place] [address], [place], [place] ?[address] ?  ?Blood culture (routine x 2)     Status: None (Preliminary result)  ? Collection Time: 02/11/22  1:29 PM  ? Specimen: BLOOD LEFT HAND  ?Result Value Ref Range Status  ? Specimen Description BLOOD LEFT HAND  Final  ? Special Requests   Final  ?  BOTTLES DRAWN AEROBIC AND ANAEROBIC Blood Culture adequate volume  ? Culture   Final  ?  NO GROWTH 3 DAYS ?Performed at [facility], [place] [address], [place], [place] [address] ?  ? Report Status PENDING  Incomplete  ?Blood culture (routine x 2)     Status: Abnormal (Preliminary result)  ? Collection Time: 02/11/22  1:38 PM  ? Specimen: BLOOD RIGHT WRIST  ?Result Value Ref Range Status  ? Specimen Description BLOOD RIGHT WRIST  Final  ? Special Requests   Final  ?  BOTTLES DRAWN AEROBIC AND ANAEROBIC Blood Culture adequate volume  ? Culture  Setup Time   Final  ?  GRAM POSITIVE COCCI ?ANAEROBIC BOTTLE ONLY ?CRITICAL RESULT CALLED TO, READ BACK BY AND VERIFIED WITH: ?PHARMD GREG ABBOTT 02/12/22@6 :47 BY TW ?  ? Culture (A)  Final  ?  STREPTOCOCCUS PYOGENES ?REPEATING SUSCEPTIBILITIES ?HEALTH DEPARTMENT NOTIFIED ?Performed at [facility], [place] [address], [place], [place] [address] ?  ? Report Status PENDING  Incomplete  ?Blood Culture ID Panel (Reflexed)     Status: Abnormal  ? Collection Time: 02/11/22  1:38 PM  ?Result Value Ref Range Status  ? Enterococcus faecalis NOT DETECTED NOT DETECTED Final  ? Enterococcus Faecium NOT DETECTED NOT DETECTED Final  ? Listeria monocytogenes NOT DETECTED NOT DETECTED Final  ? Staphylococcus species NOT DETECTED NOT DETECTED Final  ? Staphylococcus aureus (BCID) NOT DETECTED NOT DETECTED Final  ? Staphylococcus epidermidis NOT DETECTED NOT DETECTED Final  ? Staphylococcus lugdunensis NOT DETECTED NOT DETECTED Final  ? Streptococcus species DETECTED (A) NOT DETECTED Final  ?  Comment: CRITICAL RESULT CALLED TO, READ BACK BY AND VERIFIED WITH: ?PHARMD GREG ABBOTT 02/12/22@6 :47 BY TW ?  ? Streptococcus agalactiae NOT DETECTED NOT DETECTED Final  ? Streptococcus pneumoniae NOT DETECTED NOT DETECTED Final  ? Streptococcus pyogenes DETECTED (A) NOT DETECTED Final  ?  Comment: CRITICAL RESULT CALLED TO, READ BACK BY AND VERIFIED WITH: ?PHARMD GREG ABBOTT 02/12/22@6 :47 BY TW ?  ? A.calcoaceticus-baumannii NOT DETECTED NOT DETECTED Final  ? Bacteroides fragilis NOT DETECTED NOT DETECTED Final  ?  Enterobacterales NOT DETECTED NOT DETECTED Final  ? Enterobacter cloacae complex NOT DETECTED NOT DETECTED Final  ? Escherichia coli NOT DETECTED NOT DETECTED Final  ? Klebsiella aerogenes NOT DETECTED NOT DETECTED Final  ? Klebsiella oxytoca NOT DETECTED NOT DETECTED Final  ? Klebsiella pneumoniae NOT DETECTED NOT DETECTED Final  ? Proteus species NOT DETECTED NOT DETECTED Final  ? Salmonella species NOT DETECTED NOT DETECTED Final  ? Serratia marcescens NOT DETECTED NOT DETECTED Final  ? Haemophilus influenzae NOT DETECTED NOT DETECTED Final  ? Neisseria meningitidis NOT DETECTED NOT DETECTED Final  ? Pseudomonas aeruginosa NOT DETECTED NOT DETECTED Final  ? Stenotrophomonas maltophilia NOT DETECTED NOT DETECTED Final  ? Candida albicans NOT DETECTED NOT DETECTED Final  ? Candida auris NOT DETECTED NOT DETECTED Final  ? Candida glabrata  NOT DETECTED NOT DETECTED Final  ? Candida krusei NOT DETECTED NOT DETECTED Final  ? Candida parapsilosis NOT DETECTED NOT DETECTED Final  ? Candida tropicalis NOT DETECTED NOT DETECTED Final  ? Cryptococcus neoformans/gattii NOT DETECTED NOT DETECTED Final  ?  Comment: Performed at [facility] [address], [place], [place] [address]  ?Aerobic/Anaerobic Culture w Gram Stain (surgical/deep wound)     Status: None (Preliminary result)  ? Collection Time: 02/11/22  5:33 PM  ? Specimen: Wound  ?Result Value Ref Range Status  ? Specimen Description WOUND  Final  ? Special Requests RIGHT HAND  Final  ? Gram Stain   Final  ?  RARE WBC PRESENT,BOTH PMN AND MONONUCLEAR ?MODERATE GRAM POSITIVE COCCI ?Performed at [facility], [place] [address], [place], [place] [address] ?  ? Culture   Final  ?  MODERATE STREPTOCOCCUS PYOGENES ?Beta hemolytic streptococci are predictably susceptible to penicillin and other beta lactams. Susceptibility testing not routinely performed. ?NO ANAEROBES ISOLATED; CULTURE IN PROGRESS FOR 5 DAYS ?  ? Report Status PENDING  Incomplete  ?Culture, blood  (routine x 2)     Status: None (Preliminary result)  ? Collection Time: 02/12/22 10:26 AM  ? Specimen: Right Antecubital; Blood  ?Result Value Ref Range Status  ? Specimen Description RIGHT ANTECUBITAL  Final  ? Special R

## 2022-02-15 ENCOUNTER — Inpatient Hospital Stay (HOSPITAL_COMMUNITY): Payer: Medicare PPO

## 2022-02-15 DIAGNOSIS — E1165 Type 2 diabetes mellitus with hyperglycemia: Secondary | ICD-10-CM | POA: Diagnosis not present

## 2022-02-15 DIAGNOSIS — I10 Essential (primary) hypertension: Secondary | ICD-10-CM | POA: Diagnosis not present

## 2022-02-15 DIAGNOSIS — I2510 Atherosclerotic heart disease of native coronary artery without angina pectoris: Secondary | ICD-10-CM | POA: Diagnosis not present

## 2022-02-15 DIAGNOSIS — A400 Sepsis due to streptococcus, group A: Secondary | ICD-10-CM | POA: Diagnosis not present

## 2022-02-15 LAB — CBC
HCT: 35.3 % — ABNORMAL LOW (ref 39.0–52.0)
Hemoglobin: 11.6 g/dL — ABNORMAL LOW (ref 13.0–17.0)
MCH: 28.1 pg (ref 26.0–34.0)
MCHC: 32.9 g/dL (ref 30.0–36.0)
MCV: 85.5 fL (ref 80.0–100.0)
Platelets: 273 10*3/uL (ref 150–400)
RBC: 4.13 MIL/uL — ABNORMAL LOW (ref 4.22–5.81)
RDW: 15.4 % (ref 11.5–15.5)
WBC: 12.9 10*3/uL — ABNORMAL HIGH (ref 4.0–10.5)
nRBC: 0 % (ref 0.0–0.2)

## 2022-02-15 LAB — GLUCOSE, CAPILLARY
Glucose-Capillary: 214 mg/dL — ABNORMAL HIGH (ref 70–99)
Glucose-Capillary: 272 mg/dL — ABNORMAL HIGH (ref 70–99)
Glucose-Capillary: 275 mg/dL — ABNORMAL HIGH (ref 70–99)
Glucose-Capillary: 338 mg/dL — ABNORMAL HIGH (ref 70–99)

## 2022-02-15 LAB — CULTURE, BLOOD (ROUTINE X 2): Special Requests: ADEQUATE

## 2022-02-15 MED ORDER — INSULIN ASPART 100 UNIT/ML IJ SOLN
6.0000 [IU] | Freq: Three times a day (TID) | INTRAMUSCULAR | Status: DC
  Administered 2022-02-15 – 2022-02-17 (×6): 6 [IU] via SUBCUTANEOUS

## 2022-02-15 MED ORDER — IRBESARTAN 150 MG PO TABS
150.0000 mg | ORAL_TABLET | Freq: Every day | ORAL | Status: DC
Start: 2022-02-16 — End: 2022-02-17
  Administered 2022-02-16 – 2022-02-17 (×2): 150 mg via ORAL
  Filled 2022-02-15 (×2): qty 1

## 2022-02-15 MED ORDER — INSULIN GLARGINE-YFGN 100 UNIT/ML ~~LOC~~ SOLN
18.0000 [IU] | Freq: Every day | SUBCUTANEOUS | Status: DC
  Administered 2022-02-15: 18 [IU] via SUBCUTANEOUS
  Filled 2022-02-15 (×2): qty 0.18

## 2022-02-15 MED ORDER — GADOBUTROL 1 MMOL/ML IV SOLN
10.0000 mL | Freq: Once | INTRAVENOUS | Status: AC | PRN
  Administered 2022-02-15: 10 mL via INTRAVENOUS

## 2022-02-15 NOTE — Progress Notes (Signed)
, ?  Progress Note ? ? ?Patient: Erik Hancock [id number] DOB: 1950-12-02 DOA: 02/11/2022     4 ?DOS: the patient was seen and examined on 02/15/2022 ?  ? ? ? ? ?Brief hospital course: ?Mr. Cardullo is a 71 y.o. M with DM, HTN, CAD last PCI 2019, MO, OSA on CPAP and CKD IIIa, baseline Cr 1.1-1.4 who presented with right hand pain and swelling. ? ?Recently moving a hay bale and got puncture wound.  This started to puff up, then he got chills, nausea and vomiting to he went to [facility] and was sent to the ER. ? ?3/22: Admitted and underwent I&D of flexor pollicis longus by Dr. Rufina Falco ?3/23: Blood and surgical culture growing group A strep, ID consulted ?3/24-3/26: Had still extremely painful, swollen ?  ? ? ? ?Assessment and Plan: ?* Sepsis due to group A Streptococcus ([place]) ?Sepsis physiology has resolved ?- Continue penicillin ? ? ? ? ? ? ?Tenosynovitis of finger ?S/p I&D by Dr. Soundra Pilon on 3/22 ?Right hand still has severe numbness in the thumb, index finger, middle finger, as well as very weak flexing  ?-Obtain MRI hand with and without contrast ?  ? ? ? ?Stage 3a chronic kidney disease (CKD) ([place]) ?Cr over last 8 years seems to be between 1.1-1.7, today down to 1.1 ?- Resume ARB ? ? ?CAD s/p PCI 2019 ?- Continue aspirin, Crestor, Brilinta ?  ? ? ?Essential hypertension ?BP elevated  ?- Resume home valsartan ? ?Controlled type 2 diabetes mellitus with hyperglycemia, without long-term current use of insulin ([place]) ?A1c 8.4%, relatively well controlled for Age. ? ?Glucose still high, difficult to control ?-Hold Jardiance while sick ?-Hold Victoza  ?- Continue glargine, increase again ?- Continue SS corrections  ?- Continue mealtime aspart, increase again ? ? ? ? ? ? ? ?  ? ?Subjective: Still with severe pain in his right hand, radiating from the wrist to the fingers.  Still with severe swelling, numbness in the right fingers.  No fever, confusion, chest discomfort, respiratory distress ? ?Physical Exam: ?Vitals:  ? 02/14/22  1543 02/14/22 2108 02/15/22 0322 02/15/22 0801  ?BP: (!) 155/69 (!) 150/66 140/66 (!) 156/66  ?Pulse: 66 63 69 66  ?Resp: 17 18 16 18   ?Temp: 99.2 ?F (37.3 ?C) 98.8 ?F (37.1 ?C) 98.8 ?F (37.1 ?C) 98.5 ?F (36.9 ?C)  ?TempSrc: Oral Oral Oral Oral  ?SpO2: 97% 97% 97% 99%  ?Weight:      ?Height:      ? ? ?Obese adult male, sitting up in chair, no obvious distress, interactive ?RRR, no murmurs, no lower extremity edema ?Normal respiratory rate and rhythm, lungs clear without rales or wheezes ?Affect normal, appropriate attention, speech fluent, face symmetric. ?There is numbness and tingling in the first 3 fingers of the right hand, there is decreased movement of the fingers of the right hand, cap refill looks normal, there is still marked swelling ? ? ? ? ? ?Data Reviewed: ?Discussed with infectious disease.  Nursing notes reviewed.  Vital signs reviewed. ?Culture data shows group B strep, no new finding ?Labs shows improving white blood cell count, stable anemia ? ? ?Family Communication:   ? ?Disposition: ?Status is: Inpatient ?Remains inpatient appropriate because:  ?He requires ongoing IV antibiotics as his hand has persistent severe pain and swelling. ? ? ? ? ? ? Planned Discharge Destination: Home ? ? ? ? ?Author: ?[name], MD ?02/15/2022 4:00 PM ? ?For on call review [email or web address].  ?

## 2022-02-16 ENCOUNTER — Other Ambulatory Visit (HOSPITAL_COMMUNITY): Payer: Self-pay

## 2022-02-16 DIAGNOSIS — A400 Sepsis due to streptococcus, group A: Secondary | ICD-10-CM | POA: Diagnosis not present

## 2022-02-16 DIAGNOSIS — D72829 Elevated white blood cell count, unspecified: Secondary | ICD-10-CM | POA: Diagnosis not present

## 2022-02-16 DIAGNOSIS — M659 Synovitis and tenosynovitis, unspecified: Secondary | ICD-10-CM | POA: Diagnosis not present

## 2022-02-16 LAB — CBC
HCT: 34.7 % — ABNORMAL LOW (ref 39.0–52.0)
Hemoglobin: 11.4 g/dL — ABNORMAL LOW (ref 13.0–17.0)
MCH: 28.4 pg (ref 26.0–34.0)
MCHC: 32.9 g/dL (ref 30.0–36.0)
MCV: 86.5 fL (ref 80.0–100.0)
Platelets: 285 10*3/uL (ref 150–400)
RBC: 4.01 MIL/uL — ABNORMAL LOW (ref 4.22–5.81)
RDW: 15.2 % (ref 11.5–15.5)
WBC: 12.2 10*3/uL — ABNORMAL HIGH (ref 4.0–10.5)
nRBC: 0 % (ref 0.0–0.2)

## 2022-02-16 LAB — AEROBIC/ANAEROBIC CULTURE W GRAM STAIN (SURGICAL/DEEP WOUND)

## 2022-02-16 LAB — BASIC METABOLIC PANEL
Anion gap: 6 (ref 5–15)
BUN: 10 mg/dL (ref 8–23)
CO2: 24 mmol/L (ref 22–32)
Calcium: 8.2 mg/dL — ABNORMAL LOW (ref 8.9–10.3)
Chloride: 103 mmol/L (ref 98–111)
Creatinine, Ser: 1 mg/dL (ref 0.61–1.24)
GFR, Estimated: >60 mL/min (ref >60)
Glucose, Bld: 297 mg/dL — ABNORMAL HIGH (ref 70–99)
Potassium: 3.3 mmol/L — ABNORMAL LOW (ref 3.5–5.1)
Sodium: 133 mmol/L — ABNORMAL LOW (ref 135–145)

## 2022-02-16 LAB — GLUCOSE, CAPILLARY
Glucose-Capillary: 253 mg/dL — ABNORMAL HIGH (ref 70–99)
Glucose-Capillary: 224 mg/dL — ABNORMAL HIGH (ref 70–99)
Glucose-Capillary: 245 mg/dL — ABNORMAL HIGH (ref 70–99)
Glucose-Capillary: 241 mg/dL — ABNORMAL HIGH (ref 70–99)

## 2022-02-16 LAB — CULTURE, BLOOD (ROUTINE X 2)
Culture: NO GROWTH
Special Requests: ADEQUATE

## 2022-02-16 MED ORDER — LINEZOLID 600 MG PO TABS
600.0000 mg | ORAL_TABLET | Freq: Two times a day (BID) | ORAL | Status: DC
  Administered 2022-02-16 – 2022-02-17 (×3): 600 mg via ORAL
  Filled 2022-02-16 (×4): qty 1

## 2022-02-16 MED ORDER — POTASSIUM CHLORIDE 20 MEQ PO PACK
40.0000 meq | PACK | Freq: Once | ORAL | Status: AC
  Administered 2022-02-16: 40 meq via ORAL
  Filled 2022-02-16: qty 2

## 2022-02-16 MED ORDER — PENICILLIN G POTASSIUM 20000000 UNITS IJ SOLR
12.0000 10*6.[IU] | Freq: Two times a day (BID) | INTRAVENOUS | Status: DC
  Filled 2022-02-16: qty 12

## 2022-02-16 MED ORDER — INSULIN GLARGINE-YFGN 100 UNIT/ML ~~LOC~~ SOLN
21.0000 [IU] | Freq: Every day | SUBCUTANEOUS | Status: DC
  Administered 2022-02-16: 21 [IU] via SUBCUTANEOUS
  Filled 2022-02-16 (×2): qty 0.21

## 2022-02-16 NOTE — Progress Notes (Signed)
Mobility Specialist Progress Note: ? ? 02/16/22 1321  ?Mobility  ?Activity Ambulated with assistance in hallway  ?Level of Assistance Contact guard assist, steadying assist  ?Assistive Device None  ?Distance Ambulated (ft) 200 ft  ?Activity Response Tolerated well  ?$Mobility charge 1 Mobility  ? ?Pt received in chair willing to participate in mobility. Complaints of 3/10 R thumb pain and pain in his L knee. Left in chair with call bell in reach and all needs met.  ? ?Cortez Steelman ?Mobility Specialist ?Primary Phone [phone] ? ?

## 2022-02-16 NOTE — Care Management Important Message (Signed)
Important Message ? ?Patient Details  ?Name: Erik Hancock ?MRN: [id number] ?Date of Birth: 10/11/1951 ? ? ?Medicare Important Message Given:  Yes ? ? ? ? ?Marylene Land  [name] ?02/16/2022, 2:54 PM ?

## 2022-02-16 NOTE — Progress Notes (Signed)
?Progress Note ? ? ?Patient: Erik Hancock [id number] DOB: Jul 11, 1951 DOA: 02/11/2022     5 ?DOS: the patient was seen and examined on 02/16/2022 ?  ?Brief hospital course: ?Mr. Carlo is a 71 y.o. M with DM, HTN, CAD last PCI 2019, MO, OSA on CPAP and CKD IIIa, baseline Cr 1.1-1.4 who presented with right hand pain and swelling. ? ?Recently moving a hay bale and got puncture wound.  This started to puff up, then he got chills, nausea and vomiting to he went to [facility] and was sent to the ER. ? ? ? ?3/22: Radiograph without osteo, taken to OR by [name] had debridement of purulent tenosynovitis of the thumb; intraop cultures sent ?3/23: Blood cultures growing GAS in 1/2, narrowed to penicillin, echo normal ?3/24: Persistent hand swelling, poor movement ? ? ? ? ? ? ? ?Assessment and Plan: ?* Sepsis due to group A Streptococcus ([place]) ?- Transition to Linezolid day 1 of 14 days ?- ID follow up in 1 week ? ? ? ? ? ? ?Tenosynovitis of finger ?S/p I&D by Dr. Soundra Pilon on 3/22 ?Right hand still has severe numbness in the thumb, index finger, middle finger, as well as very weak flexing no change. ?MRI hand unremarkable ?- Ortho follow up after discharge ?- OT eval ? ?OSA (obstructive sleep apnea) ?Refused CPAP ? ?Metabolic acidosis, increased anion gap ?Resolved with holding Jardiance and Subq insulin.  This was likely some mild SGLT2i associated diabetic ketoacidosis, now resolved. ? ?Stage 3a chronic kidney disease (CKD) ([place]) ?Cr over last 8 years seems to be between 1.1-1.7, today down to 1.1 ?- Continue ARB ? ?Morbid obesity ([place]) ?BMI 40.72 kg/m2 ? ?GERD (gastroesophageal reflux disease) ?-Continue Protonix ? ?CAD s/p PCI 2019 ?- Continue aspirin, Crestor, Brilinta ?  ? ?Dyslipidemia, goal LDL below 70 ?-Continue Crestor ? ?Essential hypertension ?BP elevated slightly ?- Continue home valsartan ? ?Controlled type 2 diabetes mellitus with hyperglycemia, without long-term current use of insulin ([place]) ?A1c 8.4%,  relatively well controlled for Age. ? ?Glucose still high, difficult to control ?-Hold Jardiance while sick ?-Hold Victoza  ?- Continue glargine, increase again ?- Continue SS corrections  ?- Continue mealtime aspart  ? ? ? ? ?  ? ?Subjective: Still with severe hand pain, no new fever, no confusion, no other new focal symptoms.  Eating and drinking well, ambulating. ? ?Physical Exam: ?Vitals:  ? 02/15/22 1618 02/15/22 2204 02/16/22 0518 02/16/22 0737  ?BP: (!) 151/66 (!) 151/64 (!) 149/73 (!) 166/85  ?Pulse: 75 72 64 80  ?Resp: 17 20 18 19   ?Temp: 98.7 ?F (37.1 ?C) 99.1 ?F (37.3 ?C) 98.4 ?F (36.9 ?C) 98.2 ?F (36.8 ?C)  ?TempSrc: Oral Oral Oral Oral  ?SpO2: 96% 95% 97% 96%  ?Weight:      ?Height:      ? ?Obese adult male, sitting in recliner, interactive. ?RRR, no mumurs, no LE edema ?RR normal, lungs clear without rales or wheezes. ?Had still red, without ability to dorsiflex, fingers paralyzed for the most part, still swollen.  The tip of the thumb is black and discolored. ? ? ? ? ?Data Reviewed: ?Discussed with ID, Ortho.  Nursing notes reviewed, MRI report reviewed, vitals reviewed. ? ?Family Communication: Wife at bedside ? ?Disposition: ?Status is: Inpatient ?Remains inpatient appropriate because: He has inability to use the right hand.  Needs OT and transition to Linezolid. ? ?IF seen by OT, home tomorrow, ? ? ? ? ? Planned Discharge Destination: Home ? ? ? ?\ ? ? ?  Author: ?[name], MD ?02/16/2022 11:48 AM ? ?For on call review [email or web address]  ?

## 2022-02-16 NOTE — Progress Notes (Signed)
Patient ID: Erik Hancock, male   DOB: 02/28/1951, 71 y.o.   MRN: [id number] ? ? LOS: 5 days  ? ?Subjective: ?Pain, swelling mildly improved over weekend. ? ? ?Objective: ?Vital signs in last 24 hours: ?Temp:  [98.2 ?F (36.8 ?C)-99.1 ?F (37.3 ?C)] 98.2 ?F (36.8 ?C) (03/27 0737) ?Pulse Rate:  [64-80] 80 (03/27 0737) ?Resp:  [17-20] 19 (03/27 0737) ?BP: (149-166)/(64-85) 166/85 (03/27 0737) ?SpO2:  [95 %-97 %] 96 % (03/27 0737) ?Last BM Date : 02/14/22 ? ? ?Laboratory  ?CBC ?Recent Labs  ?  02/15/22 ?8657 02/16/22 ?0109  ?WBC 12.9* 12.2*  ?HGB 11.6* 11.4*  ?HCT 35.3* 34.7*  ?PLT 273 285  ? ?BMET ?Recent Labs  ?  02/14/22 ?0451 02/16/22 ?0109  ?NA 135 133*  ?K 3.7 3.3*  ?CL 103 103  ?CO2 21* 24  ?GLUCOSE 251* 297*  ?BUN 14 10  ?CREATININE 1.17 1.00  ?CALCIUM 8.2* 8.2*  ? ? ? ?Physical Exam ?General appearance: alert and no distress ?RUE: Thumb pad appears necrotic, sensation to thumb mildly paresthetic. Index finger with mod parethesia. Still mod TTP palm extending to distal volar FA with some erythema. ? ? ?Assessment/Plan: ?Right hand infection -- Continue treatment as directed ? ? ? ?Freeman Caldron, PA-C ?Orthopedic Surgery ?[phone] ?02/16/2022 ?

## 2022-02-16 NOTE — TOC Benefit Eligibility Note (Signed)
Patient Advocate Encounter ? ?Insurance verification completed.   ? ?The patient is currently admitted and upon discharge could be taking linezolid (Zyvox) 600 mg tablets. ? ?The current 30 day co-pay is, $10.00.  ? ?The patient is insured through [employer] Part D  ? ? ? ?Roland Earl, CPhT ?Pharmacy Patient Advocate Specialist ?[facility] Patient Advocate Team ?Direct Number: [phone]  Fax: [phone] ? ? ? ? ? ?  ?

## 2022-02-16 NOTE — Progress Notes (Signed)
?   ? ? ? ? ?[facility] for Infectious Disease ? ?Date of Admission:  02/11/2022   Total days of inpatient antibiotics 5 ? ?Principal Problem: ?  Sepsis due to group A Streptococcus ([place]) ?Active Problems: ?  Controlled type 2 diabetes mellitus with hyperglycemia, without long-term current use of insulin ([place]) ?  Essential hypertension ?  CAD s/p PCI 2019 ?  Morbid obesity ([place]) ?  Stage 3a chronic kidney disease (CKD) ([place]) ?  Tenosynovitis of finger ?  Metabolic acidosis, increased anion gap ?  OSA (obstructive sleep apnea) ?     ?    ?Assessment: ?71YM admitted with right hand abscess. Taken to OR for debridement. Found to have strep pyogenes bacteremia.  ? ?# Strep pyogenes bacteremia 2/2 right hand abscess ?#Right thumb abscess and flexor pollicis longus tenosynovitis SP I&D on 3/22 ?-SP I&D on 3/22 with Cx + strep pyogenes.Per OR note large amount of purulence noted from septae of thumb down to to flexor sheath ?-MRI  on 3/27 showed generalized tissue edema about dorsum of the hand. No osteomyelitis or abscess noted. ? ?Recommendations: ?-D/C penicillin ?-Start linezolid(strep coverage+anti-toxin effect) to complete 2 weeks of antibiotics from negative blood Cx (EOT 02/25/22) for bacteremia and complicated skin soft tissue infection. Linezolid should hopefully will aid in helping with the reducing swelling due to anti-toxin effect.  ? ?-Pr is interested in rehab for his hand(OT)  ?-Follow-up with ID on 03/04/22 at 3pm with myself ? ? ?Microbiology:   ?Antibiotics: ?Unasyn 3/22 ?Pen G 3/23-p ?Vancomycin 3/22 ?Cultures: ?Blood ?3/22 2/2 Strep pyogenes ?3/23 NG ?OR cx + strep pyogenes ? ?SUBJECTIVE: ?Resting in bed. He reports he is unable to bend his fingers fully. Interested in rehab for his hand. He reports swelling is better, but at a slower rate than he expected.  ? ?Review of Systems: ?ROS ? ? ?Scheduled Meds: ? aspirin  81 mg Oral Daily  ? enoxaparin (LOVENOX) injection  40 mg Subcutaneous Q24H  ?  insulin aspart  0-20 Units Subcutaneous TID WC  ? insulin aspart  6 Units Subcutaneous TID WC  ? insulin glargine-yfgn  21 Units Subcutaneous QHS  ? irbesartan  150 mg Oral Daily  ? linezolid  600 mg Oral Q12H  ? pantoprazole  40 mg Oral Daily  ? rosuvastatin  10 mg Oral Daily  ? ticagrelor  60 mg Oral BID  ? ?Continuous Infusions: ?PRN Meds:.acetaminophen **OR** acetaminophen, albuterol, fentaNYL (SUBLIMAZE) injection, lip balm, oxyCODONE-acetaminophen ?Allergies  ?Allergen Reactions  ? Beta Adrenergic Blockers   ?  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  ? ? ?OBJECTIVE: ?Vitals:  ? 02/15/22 2204 02/16/22 0518 02/16/22 0737 02/16/22 1405  ?BP: (!) 151/64 (!) 149/73 (!) 166/85 (!) 109/57  ?Pulse: 72 64 80 70  ?Resp: 20 18 19 18   ?Temp: 99.1 ?F (37.3 ?C) 98.4 ?F (36.9 ?C) 98.2 ?F (36.8 ?C) 98.1 ?F (36.7 ?C)  ?TempSrc: Oral Oral Oral Oral  ?SpO2: 95% 97% 96% 94%  ?Weight:      ?Height:      ? ?Body mass index is 40.72 kg/m?. ? ?Physical Exam ?Constitutional:   ?   General: He is not in acute distress. ?   Appearance: He is normal weight. He is not toxic-appearing.  ?HENT:  ?   Head: Normocephalic and atraumatic.  ?   Right Ear: External ear normal.  ?   Left Ear: External ear normal.  ?   Nose: No congestion or  rhinorrhea.  ?   Mouth/Throat:  ?   Mouth: Mucous membranes are moist.  ?   Pharynx: Oropharynx is clear.  ?Eyes:  ?   Extraocular Movements: Extraocular movements intact.  ?   Conjunctiva/sclera: Conjunctivae normal.  ?   Pupils: Pupils are equal, round, and reactive to light.  ?Cardiovascular:  ?   Rate and Rhythm: Normal rate and regular rhythm.  ?   Heart sounds: No murmur heard. ?  No friction rub. No gallop.  ?Pulmonary:  ?   Effort: Pulmonary effort is normal.  ?   Breath sounds: Normal breath sounds.  ?Abdominal:  ?   General: Abdomen is flat. Bowel sounds are normal.  ?   Palpations: Abdomen is soft.  ?Musculoskeletal:     ?   General: No swelling.  ?   Cervical back: Normal range  of motion and neck supple.  ?   Comments: Right hand erythema note at level of knuckles.   ?Skin: ?   General: Skin is warm and dry.  ?Neurological:  ?   General: No focal deficit present.  ?   Mental Status: He is oriented to person, place, and time.  ?Psychiatric:     ?   Mood and Affect: Mood normal.  ? ? ? ? ?Lab Results ?Lab Results  ?Component Value Date  ? WBC 12.2 (H) 02/16/2022  ? HGB 11.4 (L) 02/16/2022  ? HCT 34.7 (L) 02/16/2022  ? MCV 86.5 02/16/2022  ? PLT 285 02/16/2022  ?  ?Lab Results  ?Component Value Date  ? CREATININE 1.00 02/16/2022  ? BUN 10 02/16/2022  ? NA 133 (L) 02/16/2022  ? K 3.3 (L) 02/16/2022  ? CL 103 02/16/2022  ? CO2 24 02/16/2022  ?  ?Lab Results  ?Component Value Date  ? ALT 26 02/11/2022  ? AST 23 02/11/2022  ? ALKPHOS 93 02/11/2022  ? BILITOT 1.9 (H) 02/11/2022  ?  ? ? ? ? ?Laurice Record, MD ?[facility] ?[facility] ?02/16/2022, 4:19 PM  ?

## 2022-02-16 NOTE — Progress Notes (Signed)
PHARMACY NOTE:  ANTIMICROBIAL RENAL DOSAGE ADJUSTMENT ? ?Current antimicrobial regimen includes a mismatch between antimicrobial dosage and estimated renal function.  As per policy approved by the Pharmacy & Therapeutics and Medical Executive Committees, the antimicrobial dosage will be adjusted accordingly. ? ?Current antimicrobial dosage:  Penicillin G 8 million units every 12 hours as a continuous infusion  ? ?Indication: Group A Strep bacteremia/right hand tenosynovitis ? ?Renal Function: ? ?Estimated Creatinine Clearance: 84.4 mL/min (by C-G formula based on SCr of 1 mg/dL). ?[]      On intermittent HD, scheduled: ?[]      On CRRT ?   ?Antimicrobial dosage has been changed to:  Penicillin G 12 million units every 12 hours as a continuous infusion  ? ?Additional comments: ? ? ?Thank you for allowing pharmacy to be a part of this patient's care. ? ?Jimmy Footman, PharmD, BCPS, BCIDP ?Infectious Diseases Clinical Pharmacist ?Phone: [phone] ?02/16/2022 8:10 AM ?

## 2022-02-17 ENCOUNTER — Other Ambulatory Visit (HOSPITAL_COMMUNITY): Payer: Self-pay

## 2022-02-17 DIAGNOSIS — R7881 Bacteremia: Secondary | ICD-10-CM | POA: Diagnosis not present

## 2022-02-17 DIAGNOSIS — E1165 Type 2 diabetes mellitus with hyperglycemia: Secondary | ICD-10-CM | POA: Diagnosis not present

## 2022-02-17 DIAGNOSIS — A400 Sepsis due to streptococcus, group A: Secondary | ICD-10-CM | POA: Diagnosis not present

## 2022-02-17 DIAGNOSIS — I2510 Atherosclerotic heart disease of native coronary artery without angina pectoris: Secondary | ICD-10-CM | POA: Diagnosis not present

## 2022-02-17 LAB — BASIC METABOLIC PANEL
Anion gap: 8 (ref 5–15)
BUN: 12 mg/dL (ref 8–23)
CO2: 25 mmol/L (ref 22–32)
Calcium: 8.2 mg/dL — ABNORMAL LOW (ref 8.9–10.3)
Chloride: 102 mmol/L (ref 98–111)
Creatinine, Ser: 1.12 mg/dL (ref 0.61–1.24)
GFR, Estimated: >60 mL/min (ref >60)
Glucose, Bld: 272 mg/dL — ABNORMAL HIGH (ref 70–99)
Potassium: 3.6 mmol/L (ref 3.5–5.1)
Sodium: 135 mmol/L (ref 135–145)

## 2022-02-17 LAB — CULTURE, BLOOD (ROUTINE X 2)
Culture: NO GROWTH
Culture: NO GROWTH
Special Requests: ADEQUATE
Special Requests: ADEQUATE

## 2022-02-17 LAB — CBC
HCT: 33.9 % — ABNORMAL LOW (ref 39.0–52.0)
Hemoglobin: 11 g/dL — ABNORMAL LOW (ref 13.0–17.0)
MCH: 28.3 pg (ref 26.0–34.0)
MCHC: 32.4 g/dL (ref 30.0–36.0)
MCV: 87.1 fL (ref 80.0–100.0)
Platelets: 296 10*3/uL (ref 150–400)
RBC: 3.89 MIL/uL — ABNORMAL LOW (ref 4.22–5.81)
RDW: 15.2 % (ref 11.5–15.5)
WBC: 10.1 10*3/uL (ref 4.0–10.5)
nRBC: 0 % (ref 0.0–0.2)

## 2022-02-17 LAB — GLUCOSE, CAPILLARY
Glucose-Capillary: 240 mg/dL — ABNORMAL HIGH (ref 70–99)
Glucose-Capillary: 233 mg/dL — ABNORMAL HIGH (ref 70–99)
Glucose-Capillary: 226 mg/dL — ABNORMAL HIGH (ref 70–99)
Glucose-Capillary: 242 mg/dL — ABNORMAL HIGH (ref 70–99)

## 2022-02-17 MED ORDER — LANTUS SOLOSTAR 100 UNIT/ML ~~LOC~~ SOPN: 24.0000 [IU] | PEN_INJECTOR | Freq: Every day | SUBCUTANEOUS | 2 refills | Status: DC

## 2022-02-17 MED ORDER — LINEZOLID 600 MG PO TABS
600.0000 mg | ORAL_TABLET | Freq: Two times a day (BID) | ORAL | 0 refills | Status: AC
  Filled 2022-02-17: qty 17, 9d supply, fill #0

## 2022-02-17 MED ORDER — OXYCODONE HCL 5 MG PO TABS
7.5000 mg | ORAL_TABLET | ORAL | 0 refills | Status: AC | PRN
  Filled 2022-02-17: qty 60, 7d supply, fill #0

## 2022-02-17 NOTE — Progress Notes (Signed)
Bath chair delivered to room and taken at discharge ?

## 2022-02-17 NOTE — TOC Progression Note (Addendum)
Transition of Care (TOC) - Progression Note  ? ? ?Patient Details  ?Name: Erik Hancock ?MRN: [id number] ?Date of Birth: March 17, 1951 ? ?Transition of Care (TOC) CM/SW Contact  ?Kingsley Plan, RN ?Phone Number: ?02/17/2022, 2:07 PM ? ?Clinical Narrative:    ? ?Patient from home with wife. Confirmed face sheet information with patient. Discussed OP OT. Patient would like to go to [facility] location . MD entered ordered.  ? ?NCM discussed OT recommended tub bench . Wife confirmed , also said OT mentioned a special walker that his arm would fit into. NCM secured chatted OT await response , possible platform walker? ? ? ?Kira left for day , her supervisor Delray Alt called her. Morrell Riddle recommending PT consult to see if a quad cane or a platform walker would be best. If patient wants PT consult Delray Alt will expedite referral or he could wait until his OP OT appointment. Discussed with patient and Darl Pikes he wants to go home. They are aware OP appointment may be a few days out. Darl Pikes thinks he will do fine at home. Patient in agreement. Tub bench ordered with [employer]   ? ?Expected Discharge Plan: Home/Self Care ?  ? ?Expected Discharge Plan and Services ?Expected Discharge Plan: Home/Self Care ?  ?Discharge Planning Services: CM Consult ?Post Acute Care Choice:  (OT PT) ?Living arrangements for the past 2 months: Single Family Home ?Expected Discharge Date: 02/17/22               ?DME Arranged: Tub bench ?DME Agency: [employer] ?  ?  ?  ?HH Arranged: NA ?  ?  ?  ?  ? ? ?Social Determinants of Health (SDOH) Interventions ?  ? ?Readmission Risk Interventions ?   ? View : No data to display.  ?  ?  ?  ? ? ?

## 2022-02-17 NOTE — Progress Notes (Signed)
Patient discharged home with wife via wheelchair with all belongings. ?

## 2022-02-17 NOTE — Progress Notes (Signed)
Discharge instructions given to pt and his wife. Both verbalized understanding of teaching and had no further questions. Patient also declined platform walker ?

## 2022-02-17 NOTE — Progress Notes (Signed)
Medications delivered to pt room and taken at discharge ?

## 2022-02-17 NOTE — Evaluation (Signed)
Occupational Therapy Evaluation ?Patient Details ?Name: Erik Hancock ?MRN: [id number] ?DOB: 12-22-1950 ?Today's Date: 02/17/2022 ? ? ?History of Present Illness Pt is a 71 yr old male who had a puncture wound by moving a hay bale. Pt s/p 3/22 I&D of purulent tenosynovitis of the thumb. PMH but not limited to: DM, HTN, CAD, OSA, CKD,  ? ?Clinical Impression ?  ?[facility] Pt was educated and completed therapeutic exercises for RUE in session. Pt was guarding RUE and required AAROM at shoulder and elbow. Pt completed light therapeutic exercises in a gravity eliminated plane to increase in AROM but trace digital flexion/extension and wrist flexion/extension and deviation. Pt was educated about compensations at this time due to limitations. Pt currently with functional limitations due to the deficits listed below (see OT Problem List).  Pt will benefit from skilled OT to increase their safety and independence with ADL and functional mobility for ADL to facilitate discharge to venue listed below.  ?  ?   ? ?Recommendations for follow up therapy are one component of a multi-disciplinary discharge planning process, led by the attending physician.  Recommendations may be updated based on patient status, additional functional criteria and insurance authorization.  ? ?Follow Up Recommendations ? Follow physician's recommendations for discharge plan and follow up therapies (OP CHT)  ?  ?Assistance Recommended at Discharge Intermittent Supervision/Assistance  ?Patient can return home with the following Assistance with cooking/housework;Assist for transportation;A little help with bathing/dressing/bathroom ? ?  ?Functional Status Assessment ? Patient has had a recent decline in their functional status and demonstrates the ability to make significant improvements in function in a reasonable and predictable amount of time.  ?Equipment Recommendations ? Tub/shower seat  ?  ?Recommendations  for Other Services PT consult ? ? ?  ?Precautions / Restrictions Precautions ?Precautions: Fall ?Restrictions ?Weight Bearing Restrictions: No ?Other Position/Activity Restrictions: Per ortho note: No lifting, motion as tolerated in dressing  ? ?  ? ?Mobility Bed Mobility ?Overal bed mobility:  (Pt presented in chair but reported they were able to complete with no assistance) ?  ?  ?  ?  ?  ?  ?  ?  ? ?Transfers ?Overall transfer level: Needs assistance ?Equipment used: None (but noted they reached out for end of bed one time) ?Transfers: Sit to/from Stand ?Sit to Stand: Min guard ?  ?  ?  ?  ?  ?General transfer comment: cues on placement ?  ? ?  ?Balance Overall balance assessment: Mild deficits observed, not formally tested ?  ?  ?  ?  ?  ?  ?  ?  ?  ?  ?  ?  ?  ?  ?  ?  ?  ?  ?   ? ?ADL either performed or assessed with clinical judgement  ? ?ADL Overall ADL's : Needs assistance/impaired ?Eating/Feeding: Set up;Sitting ?  ?Grooming: Minimal assistance;Sitting ?  ?Upper Body Bathing: Min guard;Sitting ?  ?Lower Body Bathing: Minimal assistance;Sit to/from stand ?  ?Upper Body Dressing : Minimal assistance;Sitting ?  ?Lower Body Dressing: Min guard;Sit to/from stand ?  ?Toilet Transfer: Min guard ?  ?Toileting- ArchitectClothing Manipulation and Hygiene: Min guard;Sit to/from stand ?  ?  ?  ?Functional mobility during ADLs: Min guard ?General ADL Comments: Pt at this time only wanted to complete limited ambulation as L knee started to increase in pain following mobility session the day prior.  ? ? ? ?Vision   ?   ?   ?  Perception   ?  ?Praxis   ?  ? ?Pertinent Vitals/Pain Pain Assessment ?Pain Assessment: 0-10 ?Pain Score: 5  ?Breathing: normal ?Negative Vocalization: none ?Facial Expression: smiling or inexpressive ?Body Language: tense, distressed pacing, fidgeting ?Consolability: no need to console ?PAINAD Score: 1 ?Pain Location: R hand ?Pain Descriptors / Indicators: Discomfort, Grimacing, Guarding ?Pain  Intervention(s): Limited activity within patient's tolerance, Monitored during session, Premedicated before session, Repositioned, Ice applied  ? ? ? ?Hand Dominance Right ?  ?Extremity/Trunk Assessment Upper Extremity Assessment ?Upper Extremity Assessment: RUE deficits/detail ?RUE Deficits / Details: s/p ID of R thumb, pt limiting AROM from shoulder to hand at this time as guard for pain. Pt noted increase in edema in R hand. Pt reproted decrease in sensation in all digits to touch and tempature. ?RUE Sensation: decreased light touch;decreased proprioception ?RUE Coordination: decreased fine motor;decreased gross motor ?  ?Lower Extremity Assessment ?Lower Extremity Assessment: LLE deficits/detail ?LLE Deficits / Details: Pt reports "bone on bone" ?  ?  ?  ?Communication Communication ?Communication: No difficulties ?  ?Cognition Arousal/Alertness: Awake/alert ?Behavior During Therapy: [facility] for tasks assessed/performed ?Overall Cognitive Status: Within Functional Limits for tasks assessed ?  ?  ?  ?  ?  ?  ?  ?  ?  ?  ?  ?  ?  ?  ?  ?  ?  ?  ?  ?General Comments    ? ?  ?Exercises Exercises: General Upper Extremity ?General Exercises - Upper Extremity ?Shoulder Flexion: AROM, AAROM, Right, 10 reps, Seated ?Shoulder Extension: AROM, AAROM, 10 reps, Seated ?Shoulder ABduction: AROM, AAROM, 10 reps, Seated ?Shoulder Horizontal ABduction: AROM, AAROM, Right, 10 reps, Seated ?Elbow Flexion: AROM, Right, 10 reps, Seated ?Elbow Extension: AROM, 10 reps, Seated ?Wrist Flexion: AAROM, 5 reps (gravity elminated plane of two sets) ?Wrist Extension: AAROM, Right, 5 reps (gravity elminated plane of two sets) ?Digit Composite Flexion: AROM, 10 reps ?Composite Extension: AROM, Right, 10 reps ?  ?Shoulder Instructions    ? ? ?Home Living Family/patient expects to be discharged to:: Private residence ?Living Arrangements: Spouse/significant other ?Available Help at Discharge: Family ?Type of Home: House ?Home Access: Stairs to  enter ?Entrance Stairs-Number of Steps: 3 ?Entrance Stairs-Rails: None ?  ?  ?  ?Bathroom Shower/Tub: Tub/shower unit ?  ?Bathroom Toilet: Standard ?  ?  ?Home Equipment: None ?  ?  ?  ? ?  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  ?  ?  ?  ?  ?  ?Mobility Comments: Pt reports they are pending sx for L knee ?  ?  ? ?  ?  ?OT Problem List: Decreased strength;Decreased range of motion;Decreased activity tolerance;Impaired balance (sitting and/or standing);Decreased safety awareness;Decreased knowledge of use of DME or AE;Pain ?  ?   ?OT Treatment/Interventions: Self-care/ADL training;Therapeutic exercise;DME and/or AE instruction;Therapeutic activities;Patient/family education;Balance training  ?  ?OT Goals(Current goals can be found in the care plan section) Acute Rehab OT Goals ?Patient Stated Goal: to have increase in function of hand ?OT Goal Formulation: With patient ?Time For Goal Achievement: 03/03/22 ?Potential to Achieve Goals: Good  ?OT Frequency: Min 3X/week ?  ? ?Co-evaluation   ?  ?  ?  ?  ? ?  ?AM-PAC OT "6 Clicks" Daily Activity     ?Outcome Measure Help from another person eating meals?: A Little ?Help from another person taking care of personal grooming?: A Little ?Help from another person toileting, which includes using toliet,  bedpan, or urinal?: A Little ?Help from another person bathing (including washing, rinsing, drying)?: A Little ?Help from another person to put on and taking off regular upper body clothing?: A Little ?Help from another person to put on and taking off regular lower body clothing?: A Little ?6 Click Score: 18 ?  ?End of Session Equipment Utilized During Treatment: Gait belt ? ?Activity Tolerance: Patient limited by pain ?Patient left: in chair;with call bell/phone within reach;with family/visitor present ? ?OT Visit Diagnosis: Unsteadiness on feet (R26.81);Muscle weakness (generalized) (M62.81);Pain ?Pain -  Right/Left: Right ?Pain - part of body: Hand  ?              ?Time: 7425-9563 ?OT Time Calculation (min): 38 min ?Charges:  OT General Charges ?$OT Visit: 1 Visit ?OT Evaluation ?$OT Eval Low Complexity: 1 Low ?OT Treatme

## 2022-02-17 NOTE — Discharge Summary (Signed)
?Physician Discharge Summary ?  ?Patient: Erik Hancock MRN: [id number] DOB: Mar 05, 1951  ?Admit date:     02/11/2022  ?Discharge date: 02/17/22  ?Discharge Physician: [name]  ? ?PCP: [name], MD  ? ?Recommendations at discharge:  ?Follow up with Orthopedic Surgery [name] for post-op care ?Follow up with Infectious disease [name] for streptococcal bacteremia ?Follow up with Occupational Therapy ? ? ? ? ?Discharge Diagnoses: ?Principal Problem: ?  Sepsis due to group A Streptococcus ([place]) ?Active Problems: ?  Tenosynovitis of finger ?  Controlled type 2 diabetes mellitus with hyperglycemia, without long-term current use of insulin ([place]) ?  Essential hypertension ?  Coronary artery disease s/p PCI 2019 ?  Morbid obesity ([place]) ?  Stage 3a chronic kidney disease (CKD) ([place]) ?  Metabolic acidosis, increased anion gap --> Likely Jardiance-induced diabetic ketoacidosis ?  OSA (obstructive sleep apnea) ?  ? ? ? ? ? ?Hospital Course: ?[name] is a 71 y.o. M with DM, HTN, CAD last PCI 2019, [place], OSA on CPAP and CKD IIIa, baseline Cr 1.1-1.4 who presented with right hand pain and swelling. ? ?Recently moving a hay bale and got puncture wound.  This started to puff up, then he got chills, nausea and vomiting to he went to [facility] and was sent to the ER. ? ? ?Patient was taken to the OR on admission by Dr. Apolonio Schneiders for debridement of the purulent tenosynovitis of the thumb.  Intraoperative cultures growing group A strep, blood cultures subsequently growing group A strep. ? ?Infectious disease consulted, echocardiogram normal, antibiotics were narrowed to penicillin. ? ?Over the next several days, the patient had severe and unrelenting pain, persistent swelling despite elevation of the limb and persistent redness despite antibiotics.  Dr. Apolonio Schneiders [name] [name] I discussed her concerns about his slow improvement.  We repeated imaging with MRI with and without contrast.  Orthopedics reevaluated  the hand twice.  It was a medical opinion of infectious disease that empiric coverage of polymicrobial infection was unlikely to be of benefit, and given consistent culture results after agree. ? ?At the time of discharge, the patient was able to work with occupational therapy, his pain was relatively controlled with oxycodone and Tylenol.  He was discharged with return precautions, close follow-up and transitioned to Linezolid to complete 14 days total from debridement. ? ? ? ? ? ? ? ? ? ? ?  ? ?Pain control - [employer] Controlled Substance Reporting System database was reviewed. A ? ? ?Consultants: Orthopedics, Infectious disease ?Procedures performed: I&D of right flexor pollicis longus tendon, Echocardioram, MRI hand with and without contrats  ?Disposition: Home ? ?DISCHARGE MEDICATION: ?Allergies as of 02/17/2022   ? ?   Reactions  ? Beta Adrenergic Blockers   ? 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  ? ?  ? ?  ?Medication List  ?  ? ?STOP taking these medications   ? ?Jardiance 25 MG Tabs tablet ?Generic drug: empagliflozin ?  ? ?  ? ?TAKE these medications   ? ?Accu-Chek Aviva Plus test strip ?Generic drug: glucose blood ?USE STRIP TO CHECK GLUCOSE TWICE DAILY TO THREE TIMES DAILY **NEED OFFICE VISIT FOR REFILLS** ?  ?aspirin 81 MG tablet ?Take 81 mg by mouth daily. ?  ?BD ULTRA-FINE PEN NEEDLES 29G X 12.7MM Misc ?Generic drug: Insulin Pen Needle ?1 Device by Other route in the morning, at noon, in the evening, and at bedtime. ?  ?ciprofloxacin 0.3 %  ophthalmic solution ?Commonly known as: CILOXAN ?Place 1 drop into both eyes See admin instructions. 1 DROP INTO EACH EYE 4 TIMES DAILY FOR 2 DAYS AFTER EACH MONTHLY INJECTION ?  ?FreeStyle Libre 2 Sensor Misc ?1 Device by Does not apply route every 14 (fourteen) days. ?  ?Insulin Aspart FlexPen 100 UNIT/ML ?Commonly known as: NOVOLOG ?Inject 30 Units into the skin 2 (two) times daily with a meal. ?  ?Lantus SoloStar 100 UNIT/ML Solostar  Pen ?Generic drug: insulin glargine ?Inject 24 Units into the skin at bedtime. ?What changed: how much to take ?  ?linezolid 600 MG tablet ?Commonly known as: ZYVOX ?Take 1 tablet (600 mg total) by mouth every 12 (twelve) hours for 8 days. ?  ?nitroGLYCERIN 0.4 MG/SPRAY spray ?Commonly known as: NITROLINGUAL ?Place 1 spray under the tongue every 5 (five) minutes x 3 doses as needed for chest pain. ?  ?oxyCODONE 5 MG immediate release tablet ?Commonly known as: Roxicodone ?Take 1.5 tablets (7.5 mg total) by mouth every 4 (four) hours as needed for up to 7 days. ?  ?pantoprazole 40 MG tablet ?Commonly known as: PROTONIX ?Take 1 tablet (40 mg total) by mouth daily. ?  ?ticagrelor 60 MG Tabs tablet ?Commonly known as: BRILINTA ?Take 60 mg by mouth 2 (two) times daily. ?  ?valsartan 160 MG tablet ?Commonly known as: Diovan ?Take 1 tablet (160 mg total) by mouth daily. ?  ?Victoza 18 MG/3ML Sopn ?Generic drug: liraglutide ?Inject 1.8 mg into the skin daily. ?What changed: when to take this ?  ? ?  ? ?  ?  ? ? ?  ?Durable Medical Equipment  ?(From admission, onward)  ?  ? ? ?  ? ?  Start     Ordered  ? 02/17/22 1533  For home use only DME Walker platform  Once       ?Question:  Patient needs a walker to treat with the following condition  Answer:  Osteoarthritis  ? 02/17/22 1532  ? 02/17/22 1401  For home use only DME Tub bench  Once       ? 02/17/22 1400  ? ?  ?  ? ?  ? ? ?  ?Discharge Care Instructions  ?(From admission, onward)  ?  ? ? ?  ? ?  Start     Ordered  ? 02/17/22 0000  Discharge wound care:       ?Comments: Wash with soap and water and pat dry if needed. ?Cover thumb with vaseline gauze or Xeroform. ?Wrap with dry gauze and tape down.  ? 02/17/22 1354  ? ?  ?  ? ?  ? ? Follow-up Information   ? ? Rosiland Oz, MD. Schedule an appointment as soon as possible for a visit.   ?Specialty: Infectious Diseases ?Why: With Dr. West Bali or Dr. Merita Norton from Infectious Disease within 10 days ?Contact information: ?[address] ?[address] ?[place] [address] ?[phone] ? ? ?  ?  ? ? Iran Planas, MD. Schedule an appointment as soon as possible for a visit.   ?Specialty: Orthopedic Surgery ?Contact information: ?[address] ?[address] ?[place] [address] ?[phone] ? ? ?  ?  ? ? [facility] Follow up.   ?Specialty: Rehabilitation ?Contact information: ?[address] ?[id number] mc ?[place] ?[phone] ? ?  ?  ? ?  ?  ? ?  ? ?Discharge Instructions   ? ? Ambulatory referral to Occupational Therapy   Complete by: As  directed ?  ? Discharge instructions   Complete by: As directed ?  ? From Dr. Loleta Books ?You had a Group A strep (strepcoccus Pyogenes) infection in the hand, that spread to the blood stream. ? ?You were treated with penicillin here, and your blood cultures cleared. ? ?You should finish treatment with 2 weeks total of antibiotics, to end on April 5 ? ?Take Linezolid/Zyvox 600 mg twice daily starting tonight  ? ?For pain, take oxycodone with acetaminophen ? ?Here, you took oxycodone 5 mg ?Try oxycodone 7.5 mg (1 and 1/2 tabs) at home, and see if this works better.  If it is too strong (makes you sleepy or weak, stop and go back to 5 mg) ? ?Take acetaminophen to boost the oxycodone.  Take acetaminophen 500 mg (1 extra strength tab) four times a day ? ?While you are taking oxycodone, take Colace for a stool softener ? ?ALSO: ?Resume your home meds with two exceptions ?STOP Jardiance until this is healed.  Do not take it until this is completely resolved and you have seen your primary care doctor after that ? ?For now, INCREASE your Lantus at night to 20 or 24 units, as needed to keep your morning blood sugar less than 200 ? ?Also, here, we were giving you a lunch time dose of insulin, in addition to the 30 units of aspart you take twice daily  ? Discharge wound care:   Complete by: As directed ?  ? Wash with soap and water and pat dry  if needed. ?Cover thumb with vaseline gauze or Xeroform. ?Wrap with dry gauze and tape down.  ? Increase activity slowly   Complete by: As directed ?  ? ?  ? ? ?Discharge Exam: ?Danley Danker Weights  ? 03/22/

## 2022-02-17 NOTE — Progress Notes (Signed)
OT Cancellation Note ? ?Patient Details ?Name: Erik Hancock ?MRN: [id number] ?DOB: 1951/05/28 ? ? ?Cancelled Treatment:    Reason Eval/Treat Not Completed: Pain limiting ability to participate Spoke with nursing. ? ?Alphia Moh OTR/L  ?Acute Rehab Services  ?[phone] office number ?[phone] pager number ? ? ?Alphia Moh ?02/17/2022, 7:40 AM ?

## 2022-02-20 NOTE — Care Management Important Message (Signed)
Important Message ? ?Patient Details  ?Name: Erik Hancock ?MRN: [id number] ?Date of Birth: 1951/01/10 ? ? ?Medicare Important Message Given:  Yes ? ? ? ? ?Letrell Attwood ?02/20/2022, 12:16 PM ?

## 2022-02-20 NOTE — Telephone Encounter (Signed)
Erik Hancock, Erik Hancock, CMA ?  ?Spoke with patients wife Darl Pikes, (dpr on file) to give her the information regarding cpap titration appointment and she stated that the patient has already made the office and providers aware that he does not wish to proceed with any further in-lab sleep testing or titration.  ?  ? ?

## 2022-02-25 ENCOUNTER — Ambulatory Visit: Payer: Medicare PPO | Admitting: Rehabilitative and Restorative Service Providers"

## 2022-02-25 ENCOUNTER — Other Ambulatory Visit: Payer: Self-pay

## 2022-02-25 DIAGNOSIS — M79641 Pain in right hand: Secondary | ICD-10-CM | POA: Diagnosis not present

## 2022-02-25 DIAGNOSIS — M6281 Muscle weakness (generalized): Secondary | ICD-10-CM

## 2022-02-25 DIAGNOSIS — M25641 Stiffness of right hand, not elsewhere classified: Secondary | ICD-10-CM

## 2022-02-25 DIAGNOSIS — R278 Other lack of coordination: Secondary | ICD-10-CM

## 2022-02-25 NOTE — Therapy (Signed)
?OUTPATIENT OCCUPATIONAL THERAPY ORTHO EVALUATION ? ?Patient Name: Erik Hancock ?MRN: [id number] ?DOB:Oct 19, 1951, 71 y.o., male ?Today's Date: 02/25/2022 ? ?PCP: [name], MD ?REFERRING PROVIDER: [name],* ? ? OT End of Session - 02/25/22 1100   ? ? Visit Number 1   ? Number of Visits 12   ? Date for OT Re-Evaluation 04/10/22   ? Authorization Type [employer]   ? OT Start Time 1101   ? OT Stop Time 1157   ? OT Time Calculation (min) 56 min   ? Equipment Utilized During Treatment orthotic materials   ? Activity Tolerance Patient tolerated treatment well;No increased pain;Patient limited by pain   ? Behavior During Therapy [facility] for tasks assessed/performed   ? ?  ?  ? ?  ? ? ?Past Medical History:  ?Diagnosis Date  ? Arthritis   ? "all over" (07/29/2018)  ? CAD S/P percutaneous coronary angioplasty   ? a. stent to mLAD 11/2001. b. DES to PDA/mLCx 2007. c. DES to ostial LAD 07/2018.  ? Chronic lower back pain   ? CKD (chronic kidney disease), stage III ([place])   ? CKD (chronic kidney disease), stage III ([place])   ? Diabetic retinopathy ([place])   ? Diabetic retinopathy ([place])   ? GERD (gastroesophageal reflux disease)   ? High cholesterol   ? History of kidney stones   ? Hypertension   ? Mobitz type 2 second degree atrioventricular block   ? a. noted during 07/2018 adm, metoprolol discontinued.  ? Morbid obesity ([place]) 07/28/2018  ? Prostatitis   ? Pulmonary nodule 2013  ? CT  ? Sleep apnea   ? stopbang=5  ? Suspected sleep apnea   ? Type II diabetes mellitus ([place])   ? ?Past Surgical History:  ?Procedure Laterality Date  ? CARDIAC CATHETERIZATION  JUNE 2003  ? PATENT LAD STENT/ BODERLINE OBSTRUCTIVE DISEASE POSTERIOR DESCENDING ARTERY/ NORMAL LVF  ? CATARACT EXTRACTION W/ INTRAOCULAR LENS  IMPLANT, BILATERAL Bilateral 2017  ? CERVICAL SCOVILLE FORAMINOTOMY W/ EXCISION OF HERNIATED [place] PULPOSUS  2009  ? C6 - 7  ? CORONARY ANGIOPLASTY WITH STENT PLACEMENT  03/30/2006  ? DR EDMUNDS - 90% LCx@OM2  TAXUS  DES  3.0 X 20 -> 3.25 mm,   95% RPDA -- TAXUS DES 3.0 X 16 --> 3.5 mm  ? CORONARY ANGIOPLASTY WITH STENT PLACEMENT  11/2001  ? (Dr. Ilda Foil for Dr. Radford Pax) - mLAD@D2  - TAXUS EXPRESS DES 3.5 x 15   ? CORONARY ANGIOPLASTY WITH STENT PLACEMENT  07/29/2018  ? CORONARY STENT INTERVENTION N/A 07/29/2018  ? Procedure: CORONARY STENT INTERVENTION;  Surgeon: Leonie Man, MD;  Location: [place] [facility];  Service: Cardiovascular;  Laterality: N/A;  ? CYSTOSCOPY W/ RETROGRADES  05/04/2012  ? Procedure: CYSTOSCOPY WITH RETROGRADE PYELOGRAM;  Surgeon: Molli Hazard, MD;  [facility];  Service: Urology;  Laterality: Left;  ? CYSTOSCOPY W/ URETERAL STENT PLACEMENT  05/04/2012  ? Procedure: CYSTOSCOPY WITH STENT REPLACEMENT;  Surgeon: Molli Hazard, MD;  [facility];  Service: Urology;  Laterality: Left;  ? CYSTOSCOPY WITH STENT PLACEMENT Left 04/04/2012  ? CYSTOSCOPY WITH URETEROSCOPY  05/04/2012  ? Procedure: CYSTOSCOPY WITH URETEROSCOPY;  Surgeon: Molli Hazard, MD;  [facility];  Service: Urology;  Laterality: Left;   ? ?  ? I & D EXTREMITY Right 02/11/2022  ? Procedure: IRRIGATION AND DEBRIDEMENT  OF HAND AND FOREARM;  Surgeon: Iran Planas, MD;  Location: [place];  Service: Orthopedics;  Laterality: Right;  ?  LEFT HEART CATH AND CORONARY ANGIOGRAPHY N/A 07/29/2018  ? Procedure: LEFT HEART CATH AND CORONARY ANGIOGRAPHY;  Surgeon: Leonie Man, MD;  Location: [place] [facility];  Service: Cardiovascular;  Laterality: N/A;  ? LEFT URETEROSCOPIC STONE EXTRACTION  03-14-2003  ? X2  ? ?Patient Active Problem List  ? Diagnosis Date Noted  ? OSA (obstructive sleep apnea) 02/12/2022  ? Stage 3a chronic kidney disease (CKD) ([employer]) 02/11/2022  ? Tenosynovitis of finger 02/11/2022  ? Metabolic acidosis, increased anion gap 02/11/2022  ? Sepsis due to group A Streptococcus ([place]) 02/11/2022  ? First degree heart block 08/11/2018  ? Mobitz type 2 second degree atrioventricular block 07/30/2018  ? Angina,  class II ([place]) 07/29/2018  ? Morbid obesity ([place]) 07/28/2018  ? Exertional dyspnea 07/28/2018  ? Apnea 07/28/2018  ? Diabetic retinopathy ([place]) 12/24/2016  ? Controlled type 2 diabetes mellitus with hyperglycemia, without long-term current use of insulin ([place]) 02/24/2011  ? Essential hypertension 02/24/2011  ? Dyslipidemia, goal LDL below 70 02/24/2011  ? CAD s/p PCI 2019 02/24/2011  ? GERD (gastroesophageal reflux disease) 02/24/2011  ? ? ?ONSET DATE: 02/11/22 DOS ? ?REFERRING DIAG: M65.9 (ICD-10-CM) - Tenosynovitis of finger ? ?THERAPY DIAG:  ?Pain in right hand ? ?Muscle weakness (generalized) ? ?Stiffness of right hand, not elsewhere classified ? ?Other lack of coordination ? ?SUBJECTIVE:  ? 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 Swelling and pain increased in 1-2 days and he underwent sx.  He states he was hospitalized with sepsis for 2-3 days as well. He has some issues with his DM II while hospitalized. He arrives in travel w/c today due to left knee pain. He states moving at elbow, shoulder and hand the best he can so far.  ? ?PERTINENT HISTORY: Pt has has injures to right hand and past DM II with neuropathy which may inhibit healing / risk for more injury ? ?PRECAUTIONS: 2 weeks since I & D sx today, no strong/repetitive grip and pinch advised for 3-4 months, monitor for flexion contractures.  ? ?WEIGHT BEARING RESTRICTIONS  only light weightbearing as tolerated now ? ?PAIN:  ?Are you having pain? Yes ?Rating: 5/10 now  ? ?FALLS: Has patient fallen in last 6 months? No ? ?LIVING ENVIRONMENT: ?Lives with: lives with their spouse ? ?PLOF: Independent with community mobility with device, Independent with homemaking with ambulation, and needed some assist with difficult IADLs due to knee pains and other chronic conditions.  ? ?PATIENT GOALS get use of right hand back ? ?OBJECTIVE:  ? ?HAND DOMINANCE: Right ? ?ADLs: ?Overall ADLs: Unable to use right hand to grab, hold  items, open containers now, etc. Quick DASH TBD next session, but he has overtly impaired function now.  ? ? ?FUNCTIONAL OUTCOME MEASURES: ?Quick Dash: TBD ? ?UE ROM   02/25/22: all fingers and thumb very swollen, stiff, MF taken measures symbolic of all fingers today. Thumb measures also taken.  ? ?Active ROM Right ?02/25/2022  ?Wrist flexion 47*  ?Wrist extension (-15*)  ?Wrist ulnar deviation   ?Wrist radial deviation   ?Wrist pronation 85*  ?Wrist supination (-5*)  ?(Blank rows = not tested) ? ?Active ROM Right ?02/25/2022  ?Thumb MCP (0-60) (-18*) - 25*  ?Thumb IP (0-80) 0 - 0  ?Thumb Palmar abd/add (0-45) 32 - 40*  ?Thumb Opposition to Index Finger 1.5cm gap today  ?Long MCP (0-90) 17 - 30   ?Long PIP (0-100) 49 - 52  ?Long DIP (0-70) 28 -  36  ?(Blank rows = not tested) ? ? ?UE MMT:   NT at eval in thumb, but wrist 2-/5 MMT unable to fully extend in gravity eliminated ? ? ?HAND FUNCTION: ?Grip strength: Right: TBD lbs; Left: TBD lbs ? ?COORDINATION: ?TBD for Box & Blocks testing as well as 9HPT when able. Observed impairment in coordination in right hand and even wrist drop and poor finger use as well as thumb.  ? ?SENSATION: ?Impaired in volar thumb now, states he can feel LT in rt hand other fingers volarly though. He does have old wounds on knuckles from injuries he states nt feeling/not realizing (is DM II with neuropathy)  ? ?EDEMA: 8.5cm circumferential thumb P1 (6.9 in Left hand), 8.6cm circumferential IF P1 (7.4cm in Left hand), whole hand very swollen  ? ?COGNITION: ?Overall cognitive status: Within functional limits for tasks assessed and some safety awareness issues ? ? ?OBSERVATIONS: Pad of thumb volar area covered in black eschar now, very dry with bandage sticking to wound, 2 visible blue sutures present. He has MD appt on Friday when he will likely have them taken out Apparent wrist drop/droop as well.  ? ? ?TODAY'S TREATMENT:  ?02/25/22 Eval:  ?OT educates on issues with infection, self-care, keeping  clean and slightly moist wound.  OT changes his bulky dressing, adds bacitracin and xeroform to help keep wound moist, wraps with clean gauze and applies light compressive coban wrap to thumb and finger sock

## 2022-03-03 ENCOUNTER — Encounter: Payer: Self-pay | Admitting: Rehabilitative and Restorative Service Providers"

## 2022-03-03 ENCOUNTER — Ambulatory Visit: Payer: Medicare PPO | Admitting: Rehabilitative and Restorative Service Providers"

## 2022-03-03 DIAGNOSIS — M25641 Stiffness of right hand, not elsewhere classified: Secondary | ICD-10-CM | POA: Diagnosis not present

## 2022-03-03 DIAGNOSIS — M6281 Muscle weakness (generalized): Secondary | ICD-10-CM | POA: Diagnosis not present

## 2022-03-03 DIAGNOSIS — R278 Other lack of coordination: Secondary | ICD-10-CM

## 2022-03-03 DIAGNOSIS — M79641 Pain in right hand: Secondary | ICD-10-CM

## 2022-03-03 NOTE — Therapy (Signed)
?OUTPATIENT OCCUPATIONAL THERAPY TREATMENT NOTE ? ? ?Patient Name: Erik Hancock ?MRN: [id number] ?DOB:11-Jan-1951, 71 y.o., male ?Today's Date: 03/03/2022 ? ?PCP: [name], MD ?REFERRING PROVIDER: [name],* ? ?END OF SESSION:  ? OT End of Session - 03/03/22 1153   ? ? Visit Number 2   ? Number of Visits 12   ? Date for OT Re-Evaluation 04/10/22   ? Authorization Type [employer]   ? OT Start Time 1153   ? OT Stop Time 1234   ? OT Time Calculation (min) 41 min   ? Equipment Utilized During Treatment --   ? Activity Tolerance Patient tolerated treatment well;No increased pain;Patient limited by pain   ? Behavior During Therapy [facility] for tasks assessed/performed   ? ?  ?  ? ?  ? ? ?Past Medical History:  ?Diagnosis Date  ? Arthritis   ? "all over" (07/29/2018)  ? CAD S/P percutaneous coronary angioplasty   ? a. stent to mLAD 11/2001. b. DES to PDA/mLCx 2007. c. DES to ostial LAD 07/2018.  ? Chronic lower back pain   ? CKD (chronic kidney disease), stage III ([place])   ? CKD (chronic kidney disease), stage III ([place])   ? Diabetic retinopathy ([place])   ? Diabetic retinopathy ([place])   ? GERD (gastroesophageal reflux disease)   ? High cholesterol   ? History of kidney stones   ? Hypertension   ? Mobitz type 2 second degree atrioventricular block   ? a. noted during 07/2018 adm, metoprolol discontinued.  ? Morbid obesity ([place]) 07/28/2018  ? Prostatitis   ? Pulmonary nodule 2013  ? CT  ? Sleep apnea   ? stopbang=5  ? Suspected sleep apnea   ? Type II diabetes mellitus ([place])   ? ?Past Surgical History:  ?Procedure Laterality Date  ? CARDIAC CATHETERIZATION  JUNE 2003  ? PATENT LAD STENT/ BODERLINE OBSTRUCTIVE DISEASE POSTERIOR DESCENDING ARTERY/ NORMAL LVF  ? CATARACT EXTRACTION W/ INTRAOCULAR LENS  IMPLANT, BILATERAL Bilateral 2017  ? CERVICAL SCOVILLE FORAMINOTOMY W/ EXCISION OF HERNIATED [place] PULPOSUS  2009  ? C6 - 7  ? CORONARY ANGIOPLASTY WITH STENT PLACEMENT  03/30/2006  ? DR EDMUNDS - 90% LCx@OM2  TAXUS   DES 3.0 X 20 -> 3.25 mm,   95% RPDA -- TAXUS DES 3.0 X 16 --> 3.5 mm  ? CORONARY ANGIOPLASTY WITH STENT PLACEMENT  11/2001  ? (Dr. Ilda Foil for Dr. Radford Pax) - mLAD@D2  - TAXUS EXPRESS DES 3.5 x 15   ? CORONARY ANGIOPLASTY WITH STENT PLACEMENT  07/29/2018  ? CORONARY STENT INTERVENTION N/A 07/29/2018  ? Procedure: CORONARY STENT INTERVENTION;  Surgeon: Leonie Man, MD;  Location: [place] [facility];  Service: Cardiovascular;  Laterality: N/A;  ? CYSTOSCOPY W/ RETROGRADES  05/04/2012  ? Procedure: CYSTOSCOPY WITH RETROGRADE PYELOGRAM;  Surgeon: Molli Hazard, MD;  [facility];  Service: Urology;  Laterality: Left;  ? CYSTOSCOPY W/ URETERAL STENT PLACEMENT  05/04/2012  ? Procedure: CYSTOSCOPY WITH STENT REPLACEMENT;  Surgeon: Molli Hazard, MD;  [facility];  Service: Urology;  Laterality: Left;  ? CYSTOSCOPY WITH STENT PLACEMENT Left 04/04/2012  ? CYSTOSCOPY WITH URETEROSCOPY  05/04/2012  ? Procedure: CYSTOSCOPY WITH URETEROSCOPY;  Surgeon: Molli Hazard, MD;  [facility];  Service: Urology;  Laterality: Left;   ? ?  ? I & D EXTREMITY Right 02/11/2022  ? Procedure: IRRIGATION AND DEBRIDEMENT  OF HAND AND FOREARM;  Surgeon: Iran Planas, MD;  Location: [place];  Service:  Orthopedics;  Laterality: Right;  ? LEFT HEART CATH AND CORONARY ANGIOGRAPHY N/A 07/29/2018  ? Procedure: LEFT HEART CATH AND CORONARY ANGIOGRAPHY;  Surgeon: Leonie Man, MD;  Location: [place] [facility];  Service: Cardiovascular;  Laterality: N/A;  ? LEFT URETEROSCOPIC STONE EXTRACTION  03-14-2003  ? X2  ? ?Patient Active Problem List  ? Diagnosis Date Noted  ? OSA (obstructive sleep apnea) 02/12/2022  ? Stage 3a chronic kidney disease (CKD) ([place]) 02/11/2022  ? Tenosynovitis of finger 02/11/2022  ? Metabolic acidosis, increased anion gap 02/11/2022  ? Sepsis due to group A Streptococcus ([place]) 02/11/2022  ? First degree heart block 08/11/2018  ? Mobitz type 2 second degree atrioventricular block 07/30/2018  ?  Angina, class II ([place]) 07/29/2018  ? Morbid obesity ([place]) 07/28/2018  ? Exertional dyspnea 07/28/2018  ? Apnea 07/28/2018  ? Diabetic retinopathy ([place]) 12/24/2016  ? Controlled type 2 diabetes mellitus with hyperglycemia, without long-term current use of insulin ([place]) 02/24/2011  ? Essential hypertension 02/24/2011  ? Dyslipidemia, goal LDL below 70 02/24/2011  ? CAD s/p PCI 2019 02/24/2011  ? GERD (gastroesophageal reflux disease) 02/24/2011  ? ? ?ONSET DATE: 02/11/22 DOS ?  ?REFERRING DIAG: M65.9 (ICD-10-CM) - Tenosynovitis of finger ? ?THERAPY DIAG:  ?Pain in right hand ? ?Muscle weakness (generalized) ? ?Other lack of coordination ? ?Stiffness of right hand, not elsewhere classified ? ? ?PERTINENT HISTORY: Pt has has injures to right hand and past DM II with neuropathy which may inhibit healing / risk for more injury ? ?PRECAUTIONS: 3 weeks since I & D sx, no strong/repetitive grip and pinch advised for 3-4 months, monitor for flexion contractures.  ? ?SUBJECTIVE: He states sx f/u went well, but they do not want him doing his own dressing changes- only therapy during visits 2xweek. This is somewhat abnormal but they must be very concerned about infection recurrence. He has infectious disease doctor appt tomorrow.  ? ?PAIN:  ?Are you having pain? Yes ?Rating: 3/10 in right wrist and hand ? ? ?OBJECTIVE: (All objective assessments below are from initial evaluation on: 02/25/22 unless otherwise specified.)  ? ?HAND DOMINANCE: Right ?  ?ADLs: ?Overall ADLs: Unable to use right hand to grab, hold items, open containers now, etc. Quick DASH TBD next session, but he has overtly impaired function now.  ?  ?  ?FUNCTIONAL OUTCOME MEASURES: ?Quick Dash: TBD ?  ?UE ROM   02/25/22: all fingers and thumb very swollen, stiff, MF taken measures symbolic of all fingers today. Thumb measures also taken.  ?  ?Active ROM Right ?02/25/2022  ?Wrist flexion 47*  ?Wrist extension (-15*)  ?Wrist ulnar deviation    ?Wrist radial deviation     ?Wrist pronation 85*  ?Wrist supination (-5*)  ?(Blank rows = not tested) ?  ?Active ROM Right ?02/25/2022  ?Thumb MCP (0-60) (-18*) - 25*  ?Thumb IP (0-80) 0 - 0  ?Thumb Palmar abd/add (0-45) 32 - 40*  ?Thumb Opposition to Index Finger 1.5cm gap today  ?Long MCP (0-90) 17 - 30   ?Long PIP (0-100) 49 - 52  ?Long DIP (0-70) 28 - 36  ?(Blank rows = not tested) ?  ?  ?UE MMT:   NT at eval in thumb, but wrist 2-/5 MMT unable to fully extend in gravity eliminated ?  ?  ?HAND FUNCTION: ?Grip strength: Right: TBD lbs; Left: TBD lbs ?  ?COORDINATION: ?TBD for Box & Blocks testing as well as 9HPT when able. Observed impairment in coordination in right hand  and even wrist drop and poor finger use as well as thumb.  ?  ?SENSATION: ?Impaired in volar thumb now, states he can feel LT in rt hand other fingers volarly though. He does have old wounds on knuckles from injuries he states nt feeling/not realizing (is DM II with neuropathy)  ?  ?EDEMA: 8.5cm circumferential thumb P1 (6.9 in Left hand), 8.6cm circumferential IF P1 (7.4cm in Left hand), whole hand very swollen  ?  ?COGNITION: ?Overall cognitive status: Within functional limits for tasks assessed and some safety awareness issues ?  ?  ?OBSERVATIONS: Pad of thumb volar area covered in black eschar now, very dry with bandage sticking to wound, 2 visible blue sutures present. He has MD appt on Friday when he will likely have them taken out Apparent wrist drop/droop as well.  ?  ?  ?TODAY'S TREATMENT:  ?03/03/22: OT adjusts orthotic thumb piece slightly. Helps change dressings, reminding to keep clean and moist with bacitracin and xeroform for now. He reviews hand and wrist AROM but does too quickly. OT reeducates to move slowly over the course of 3-5 seconds, hold light tension. OT also does manual stretches to fingers and thumb also wrist extension as tolerated with edu for PROM at home to wrist, thumb, fingers. Edema reduction edu with edema wrapping reviewed as well.  Orthotic to be removed ~every 2 hours to do motions and PROM as tolerated. Wife is present today as well and they both state understanding.  ? ? ?02/25/22 Eval:  ?OT educates on issues with infection, self-care, keepin

## 2022-03-04 ENCOUNTER — Other Ambulatory Visit: Payer: Self-pay

## 2022-03-04 ENCOUNTER — Ambulatory Visit (INDEPENDENT_AMBULATORY_CARE_PROVIDER_SITE_OTHER): Payer: Medicare PPO | Admitting: Internal Medicine

## 2022-03-04 VITALS — BP 121/67 | HR 102 | Resp 16 | Ht 67.0 in | Wt 260.0 lb

## 2022-03-04 DIAGNOSIS — S61001S Unspecified open wound of right thumb without damage to nail, sequela: Secondary | ICD-10-CM | POA: Diagnosis not present

## 2022-03-04 DIAGNOSIS — Z5181 Encounter for therapeutic drug level monitoring: Secondary | ICD-10-CM | POA: Diagnosis not present

## 2022-03-04 DIAGNOSIS — S61001D Unspecified open wound of right thumb without damage to nail, subsequent encounter: Secondary | ICD-10-CM | POA: Diagnosis not present

## 2022-03-04 NOTE — Progress Notes (Signed)
? ?   ? ? ? ? ?Patient Active Problem List  ? Diagnosis Date Noted  ? OSA (obstructive sleep apnea) 02/12/2022  ? Stage 3a chronic kidney disease (CKD) ([place]) 02/11/2022  ? Tenosynovitis of finger 02/11/2022  ? Metabolic acidosis, increased anion gap 02/11/2022  ? Sepsis due to group A Streptococcus ([place]) 02/11/2022  ? First degree heart block 08/11/2018  ? Mobitz type 2 second degree atrioventricular block 07/30/2018  ? Angina, class II ([place]) 07/29/2018  ? Morbid obesity ([place]) 07/28/2018  ? Exertional dyspnea 07/28/2018  ? Apnea 07/28/2018  ? Diabetic retinopathy ([place]) 12/24/2016  ? Controlled type 2 diabetes mellitus with hyperglycemia, without long-term current use of insulin ([place]) 02/24/2011  ? Essential hypertension 02/24/2011  ? Dyslipidemia, goal LDL below 70 02/24/2011  ? CAD s/p PCI 2019 02/24/2011  ? GERD (gastroesophageal reflux disease) 02/24/2011  ? ? ?Patient's Medications  ?New Prescriptions  ? No medications on file  ?Previous Medications  ? ACCU-CHEK AVIVA PLUS TEST STRIP    USE STRIP TO CHECK GLUCOSE TWICE DAILY TO THREE TIMES DAILY **NEED OFFICE VISIT FOR REFILLS**  ? ASPIRIN 81 MG TABLET    Take 81 mg by mouth daily.  ? CIPROFLOXACIN (CILOXAN) 0.3 % OPHTHALMIC SOLUTION    Place 1 drop into both eyes See admin instructions. 1 DROP INTO EACH EYE 4 TIMES DAILY FOR 2 DAYS AFTER EACH MONTHLY INJECTION  ? CONTINUOUS BLOOD GLUC SENSOR (FREESTYLE LIBRE 2 SENSOR) MISC    1 Device by Does not apply route every 14 (fourteen) days.  ? INSULIN ASPART FLEXPEN 100 UNIT/ML SOPN    Inject 30 Units into the skin 2 (two) times daily with a meal.  ? INSULIN GLARGINE (LANTUS SOLOSTAR) 100 UNIT/ML SOLOSTAR PEN    Inject 24 Units into the skin at bedtime.  ? INSULIN PEN NEEDLE (BD ULTRA-FINE PEN NEEDLES) 29G X 12.7MM MISC    1 Device by Other route in the morning, at noon, in the evening, and at bedtime.  ? LIRAGLUTIDE (VICTOZA) 18 MG/3ML SOPN    Inject 1.8 mg into the skin daily.  ? NITROGLYCERIN (NITROLINGUAL) 0.4  MG/SPRAY SPRAY    Place 1 spray under the tongue every 5 (five) minutes x 3 doses as needed for chest pain.  ? PANTOPRAZOLE (PROTONIX) 40 MG TABLET    Take 1 tablet (40 mg total) by mouth daily.  ? TICAGRELOR (BRILINTA) 60 MG TABS TABLET    Take 60 mg by mouth 2 (two) times daily.  ? VALSARTAN (DIOVAN) 160 MG TABLET    Take 1 tablet (160 mg total) by mouth daily.  ?Modified Medications  ? No medications on file  ?Discontinued Medications  ? No medications on file  ? ? ?Subjective: ?[name] with PMHx as below presents for hospital follow-up  of strep pyogenes bacteremia 2/2 rt hand abscess, right thumb abscess and flexor pollicis longus tenosynovitis SP I&D on 3/22 with Cx+ strep pyogens. He was admitted to [facility] 3/22-28. 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 Taken to OR for urgent debridement with OR and blood Cx+ strep pyogens. Treated Pen G 3/23-3/27->Linezolid EOT 02/25/22 to complete 2 weeks of abx from negative Cx. Linezolid was added for anti-toxin effect as pt continued to have swelling of left hand with limited ROM. MRI of right hand on 3/27 showed soft tissue edema without abscess.  ?03/04/22: Pt is working with OT. He continues to have right hand edema. He has extremely limited movement of his fingers. Denies  fever, chill.  ?Review of Systems: ?Review of Systems  ?All other systems reviewed and are negative. ? ?Past Medical History:  ?Diagnosis Date  ? Arthritis   ? "all over" (07/29/2018)  ? CAD S/P percutaneous coronary angioplasty   ? a. stent to mLAD 11/2001. b. DES to PDA/mLCx 2007. c. DES to ostial LAD 07/2018.  ? Chronic lower back pain   ? CKD (chronic kidney disease), stage III ([place])   ? CKD (chronic kidney disease), stage III ([place])   ? Diabetic retinopathy ([place])   ? Diabetic retinopathy ([place])   ? GERD (gastroesophageal reflux disease)   ? High cholesterol   ? History of kidney stones   ? Hypertension   ? Mobitz type 2 second degree atrioventricular block   ? a. noted during 07/2018 adm,  metoprolol discontinued.  ? Morbid obesity ([place]) 07/28/2018  ? Prostatitis   ? Pulmonary nodule 2013  ? CT  ? Sleep apnea   ? stopbang=5  ? Suspected sleep apnea   ? Type II diabetes mellitus ([place])   ? ? ?Social History  ? ?Tobacco Use  ? Smoking status: Never  ? Smokeless tobacco: Never  ?Vaping Use  ? Vaping Use: Never used  ?Substance Use Topics  ? Alcohol use: Never  ? Drug use: Never  ? ? ?Family History  ?Problem Relation Age of Onset  ? Hypertension Mother   ? Heart attack Father   ? Heart disease Father   ? Alzheimer's disease Father   ? Diabetes Father   ? Heart attack Paternal Grandfather   ? Heart disease Paternal Grandfather   ? Lupus Sister   ? ? ?Allergies  ?Allergen Reactions  ? Beta Adrenergic Blockers   ?  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  ? ? ?Health Maintenance  ?Topic Date Due  ? Hepatitis C Screening  Never done  ? COVID-19 Vaccine (5 - Booster for Pfizer series) 06/20/2021  ? INFLUENZA VACCINE  06/23/2022  ? OPHTHALMOLOGY EXAM  08/07/2022  ? HEMOGLOBIN A1C  08/14/2022  ? FOOT EXAM  08/21/2022  ? Fecal DNA (Cologuard)  09/13/2022  ? TETANUS/TDAP  02/12/2032  ? Pneumonia Vaccine 47+ Years old  Completed  ? Zoster Vaccines- Shingrix  Completed  ? HPV VACCINES  Aged Out  ? ? ?Objective: ? ?There were no vitals filed for this visit. ?There is no height or weight on file to calculate BMI. ? ?Physical Exam ?Constitutional:   ?   General: He is not in acute distress. ?   Appearance: He is normal weight. He is not toxic-appearing.  ?HENT:  ?   Head: Normocephalic and atraumatic.  ?   Right Ear: External ear normal.  ?   Left Ear: External ear normal.  ?   Nose: No congestion or rhinorrhea.  ?   Mouth/Throat:  ?   Mouth: Mucous membranes are moist.  ?   Pharynx: Oropharynx is clear.  ?Eyes:  ?   Extraocular Movements: Extraocular movements intact.  ?   Conjunctiva/sclera: Conjunctivae normal.  ?   Pupils: Pupils are equal, round, and reactive to light.  ?Cardiovascular:  ?    Rate and Rhythm: Normal rate and regular rhythm.  ?   Heart sounds: No murmur heard. ?  No friction rub. No gallop.  ?Pulmonary:  ?   Effort: Pulmonary effort is normal.  ?   Breath sounds: Normal breath sounds.  ?Abdominal:  ?   General: Abdomen is flat. Bowel sounds  are normal.  ?   Palpations: Abdomen is soft.  ?Musculoskeletal:     ?   General: No swelling.  ?   Cervical back: Normal range of motion and neck supple.  ?   Comments: Limited rom of fingers  ?Neurological:  ?   General: No focal deficit present.  ?   Mental Status: He is oriented to person, place, and time.  ?Psychiatric:     ?   Mood and Affect: Mood normal.  ? ? ?Lab Results ?Lab Results  ?Component Value Date  ? WBC 10.1 02/17/2022  ? HGB 11.0 (L) 02/17/2022  ? HCT 33.9 (L) 02/17/2022  ? MCV 87.1 02/17/2022  ? PLT 296 02/17/2022  ?  ?Lab Results  ?Component Value Date  ? CREATININE 1.12 02/17/2022  ? BUN 12 02/17/2022  ? NA 135 02/17/2022  ? K 3.6 02/17/2022  ? CL 102 02/17/2022  ? CO2 25 02/17/2022  ?  ?Lab Results  ?Component Value Date  ? ALT 26 02/11/2022  ? AST 23 02/11/2022  ? ALKPHOS 93 02/11/2022  ? BILITOT 1.9 (H) 02/11/2022  ?  ?Lab Results  ?Component Value Date  ? CHOL 134 06/26/2021  ? HDL 47 06/26/2021  ? [place] 71 06/26/2021  ? TRIG 84 06/26/2021  ? CHOLHDL 2.9 06/26/2021  ? ?No results found for: LABRPR, RPRTITER ?No results found for: HIV1RNAQUANT, HIV1RNAVL, CD4TABS ?  ?Assessment and Plan ? ?# Strep pyogenes bacteremia 2/2 right hand abscess ?#Right thumb abscess and flexor pollicis longus tenosynovitis SP I&D on 3/22 ?-SP I&D on 3/22 with Cx + strep pyogenes. ?-MRI  on 3/27 showed generalized tissue edema about dorsum of the hand. Completed 2 weeks of antibiotics with linezolid on 02/25/22 ?Plan: ?-Orhto follow-up tomorrow at [facility]  ?-Mri hand. I am concerned about persistent right hand edema. Erythema has resolved as such may be a mechanical issue. Will get labs today labs today: cbc, cmp, esr and crp ?-Follow-up 2  weeks ? ? ?Laurice Record, MD ?[facility] ?[facility] ?03/04/2022, 2:46 PM  ?

## 2022-03-05 ENCOUNTER — Encounter: Payer: Self-pay | Admitting: Rehabilitative and Restorative Service Providers"

## 2022-03-05 ENCOUNTER — Ambulatory Visit: Payer: Medicare PPO | Admitting: Rehabilitative and Restorative Service Providers"

## 2022-03-05 DIAGNOSIS — M79641 Pain in right hand: Secondary | ICD-10-CM

## 2022-03-05 DIAGNOSIS — R278 Other lack of coordination: Secondary | ICD-10-CM

## 2022-03-05 DIAGNOSIS — M6281 Muscle weakness (generalized): Secondary | ICD-10-CM | POA: Diagnosis not present

## 2022-03-05 DIAGNOSIS — M25641 Stiffness of right hand, not elsewhere classified: Secondary | ICD-10-CM

## 2022-03-05 LAB — COMPLETE METABOLIC PANEL WITH GFR
AG Ratio: 0.8 (calc) — ABNORMAL LOW (ref 1.0–2.5)
ALT: 25 U/L (ref 9–46)
AST: 24 U/L (ref 10–35)
Albumin: 3.6 g/dL (ref 3.6–5.1)
Alkaline phosphatase (APISO): 96 U/L (ref 35–144)
BUN: 11 mg/dL (ref 7–25)
CO2: 25 mmol/L (ref 20–32)
Calcium: 9.1 mg/dL (ref 8.6–10.3)
Chloride: 104 mmol/L (ref 98–110)
Creat: 1.05 mg/dL (ref 0.70–1.28)
Globulin: 4.3 g/dL (calc) — ABNORMAL HIGH (ref 1.9–3.7)
Glucose, Bld: 102 mg/dL — ABNORMAL HIGH (ref 65–99)
Potassium: 3.8 mmol/L (ref 3.5–5.3)
Sodium: 141 mmol/L (ref 135–146)
Total Bilirubin: 0.5 mg/dL (ref 0.2–1.2)
Total Protein: 7.9 g/dL (ref 6.1–8.1)
eGFR: 76 mL/min/{1.73_m2} (ref >=60)

## 2022-03-05 LAB — C-REACTIVE PROTEIN: CRP: 4.1 mg/L (ref <8.0)

## 2022-03-05 LAB — CBC WITH DIFFERENTIAL/PLATELET
Absolute Monocytes: 622 cells/uL (ref 200–950)
Basophils Absolute: 52 cells/uL (ref 0–200)
Basophils Relative: 0.7 %
Eosinophils Absolute: 340 cells/uL (ref 15–500)
Eosinophils Relative: 4.6 %
HCT: 39.4 % (ref 38.5–50.0)
Hemoglobin: 13 g/dL — ABNORMAL LOW (ref 13.2–17.1)
Lymphs Abs: 1931 cells/uL (ref 850–3900)
MCH: 28.7 pg (ref 27.0–33.0)
MCHC: 33 g/dL (ref 32.0–36.0)
MCV: 87 fL (ref 80.0–100.0)
MPV: 8.7 fL (ref 7.5–12.5)
Monocytes Relative: 8.4 %
Neutro Abs: 4455 cells/uL (ref 1500–7800)
Neutrophils Relative %: 60.2 %
Platelets: 355 10*3/uL (ref 140–400)
RBC: 4.53 10*6/uL (ref 4.20–5.80)
RDW: 14.4 % (ref 11.0–15.0)
Total Lymphocyte: 26.1 %
WBC: 7.4 10*3/uL (ref 3.8–10.8)

## 2022-03-05 LAB — SEDIMENTATION RATE: Sed Rate: 99 mm/h — ABNORMAL HIGH (ref 0–20)

## 2022-03-05 NOTE — Therapy (Signed)
?OUTPATIENT OCCUPATIONAL THERAPY TREATMENT NOTE ? ? ?Patient Name: Erik Hancock ?MRN: [id number] ?DOB:[birth year], 71 y.o., male ?Today's Date: 03/05/2022 ? ?PCP: [name], MD ?REFERRING PROVIDER: [name],* ? ?END OF SESSION:  ? OT End of Session - 03/05/22 1344   ? ? Visit Number 3   ? Number of Visits 12   ? Date for OT Re-Evaluation 04/10/22   ? Authorization Type [employer]   ? OT Start Time 1345   ? OT Stop Time 1433   ? OT Time Calculation (min) 48 min   ? Equipment Utilized During Treatment wound care materials   ? Activity Tolerance Patient tolerated treatment well;No increased pain;Patient limited by pain   ? Behavior During Therapy [facility] for tasks assessed/performed   ? ?  ?  ? ?  ? ? ? ?Past Medical History:  ?Diagnosis Date  ? Arthritis   ? "all over" (07/29/2018)  ? CAD S/P percutaneous coronary angioplasty   ? a. stent to mLAD 11/2001. b. DES to PDA/mLCx 2007. c. DES to ostial LAD 07/2018.  ? Chronic lower back pain   ? CKD (chronic kidney disease), stage III ([place])   ? CKD (chronic kidney disease), stage III ([place])   ? Diabetic retinopathy ([place])   ? Diabetic retinopathy ([place])   ? GERD (gastroesophageal reflux disease)   ? High cholesterol   ? History of kidney stones   ? Hypertension   ? Mobitz type 2 second degree atrioventricular block   ? a. noted during 07/2018 adm, metoprolol discontinued.  ? Morbid obesity ([place]) 07/28/2018  ? Prostatitis   ? Pulmonary nodule 2013  ? CT  ? Sleep apnea   ? stopbang=5  ? Suspected sleep apnea   ? Type II diabetes mellitus ([place])   ? ?Past Surgical History:  ?Procedure Laterality Date  ? CARDIAC CATHETERIZATION  JUNE 2003  ? PATENT LAD STENT/ BODERLINE OBSTRUCTIVE DISEASE POSTERIOR DESCENDING ARTERY/ NORMAL LVF  ? CATARACT EXTRACTION W/ INTRAOCULAR LENS  IMPLANT, BILATERAL Bilateral 2017  ? CERVICAL SCOVILLE FORAMINOTOMY W/ EXCISION OF HERNIATED [place] PULPOSUS  2009  ? C6 - 7  ? CORONARY ANGIOPLASTY WITH STENT PLACEMENT  03/30/2006  ? DR EDMUNDS -  90% LCx@OM2  TAXUS  DES 3.0 X 20 -> 3.25 mm,   95% RPDA -- TAXUS DES 3.0 X 16 --> 3.5 mm  ? CORONARY ANGIOPLASTY WITH STENT PLACEMENT  11/2001  ? (Dr. Ilda Foil for Dr. Radford Pax) - mLAD@D2  - TAXUS EXPRESS DES 3.5 x 15   ? CORONARY ANGIOPLASTY WITH STENT PLACEMENT  07/29/2018  ? CORONARY STENT INTERVENTION N/A 07/29/2018  ? Procedure: CORONARY STENT INTERVENTION;  Surgeon: Leonie Man, MD;  Location: [place] [facility];  Service: Cardiovascular;  Laterality: N/A;  ? CYSTOSCOPY W/ RETROGRADES  05/04/2012  ? Procedure: CYSTOSCOPY WITH RETROGRADE PYELOGRAM;  Surgeon: Molli Hazard, MD;  Location: [facility];  Service: Urology;  Laterality: Left;  ? CYSTOSCOPY W/ URETERAL STENT PLACEMENT  05/04/2012  ? Procedure: CYSTOSCOPY WITH STENT REPLACEMENT;  Surgeon: Molli Hazard, MD;  Location: [facility];  Service: Urology;  Laterality: Left;  ? CYSTOSCOPY WITH STENT PLACEMENT Left 04/04/2012  ? CYSTOSCOPY WITH URETEROSCOPY  05/04/2012  ? Procedure: CYSTOSCOPY WITH URETEROSCOPY;  Surgeon: Molli Hazard, MD;  Location: [facility];  Service: Urology;  Laterality: Left;   ? ?  ? I & D EXTREMITY Right 02/11/2022  ? Procedure: IRRIGATION AND DEBRIDEMENT  OF HAND AND FOREARM;  Surgeon: Iran Planas, MD;  Location[facility]  OR;  Service: Orthopedics;  Laterality: Right;  ? LEFT HEART CATH AND CORONARY ANGIOGRAPHY N/A 07/29/2018  ? Procedure: LEFT HEART CATH AND CORONARY ANGIOGRAPHY;  Surgeon: Leonie Man, MD;  Location: [place] [facility];  Service: Cardiovascular;  Laterality: N/A;  ? LEFT URETEROSCOPIC STONE EXTRACTION  03-14-2003  ? X2  ? ?Patient Active Problem List  ? Diagnosis Date Noted  ? OSA (obstructive sleep apnea) 02/12/2022  ? Stage 3a chronic kidney disease (CKD) ([place]) 02/11/2022  ? Tenosynovitis of finger 02/11/2022  ? Metabolic acidosis, increased anion gap 02/11/2022  ? Sepsis due to group A Streptococcus ([place]) 02/11/2022  ? First degree heart block 08/11/2018  ? Mobitz type 2 second degree atrioventricular block  07/30/2018  ? Angina, class II ([place]) 07/29/2018  ? Morbid obesity ([place]) 07/28/2018  ? Exertional dyspnea 07/28/2018  ? Apnea 07/28/2018  ? Diabetic retinopathy ([place]) 12/24/2016  ? Controlled type 2 diabetes mellitus with hyperglycemia, without long-term current use of insulin ([place]) 02/24/2011  ? Essential hypertension 02/24/2011  ? Dyslipidemia, goal LDL below 70 02/24/2011  ? CAD s/p PCI 2019 02/24/2011  ? GERD (gastroesophageal reflux disease) 02/24/2011  ? ? ?ONSET DATE: 02/11/22 DOS ?  ?REFERRING DIAG: M65.9 (ICD-10-CM) - Tenosynovitis of finger ? ?THERAPY DIAG:  ?Pain in right hand ? ?Other lack of coordination ? ?Muscle weakness (generalized) ? ?Stiffness of right hand, not elsewhere classified ? ? ?PERTINENT HISTORY: Pt has has injures to right hand and past DM II with neuropathy which may inhibit healing / risk for more injury ? ?PRECAUTIONS: 3 weeks since I & D sx, no strong/repetitive grip and pinch advised for 3-4 months, monitor for flexion contractures.  ? ?SUBJECTIVE:  ?He arrives with spouse who states he showed low white blood cell count on recent blood test . They state that he may have MRI ordered due to soreness in arm, still concerns for swelling/infection. He states doing HEP and keeping hand clean, though he still tends to hold and cradle right arm/wrist in left hand (signs of disuse).  ? ? ?PAIN:  ?Are you having pain? Yes ?Rating: 3/10 in right wrist and hand ? ? ?OBJECTIVE: (All objective assessments below are from initial evaluation on: 02/25/22 unless otherwise specified.)  ? ?HAND DOMINANCE: Right ?  ?ADLs: ?Overall ADLs: Unable to use right hand to grab, hold items, open containers now, etc. Quick DASH TBD next session, but he has overtly impaired function now.  ?  ?  ?FUNCTIONAL OUTCOME MEASURES: ?Quick Dash: TBD ?  ?UE ROM   02/25/22: all fingers and thumb very swollen, stiff, MF taken measures symbolic of all fingers today. Thumb measures also taken.  ?  ?Active ROM Right ?02/25/2022  Right ?03/05/22  ?Wrist flexion 47* 41*  ?Wrist extension (-15*) 13* now  ?Wrist ulnar deviation     ?Wrist radial deviation     ?Wrist pronation 85* 85*  ?Wrist supination (-5*) 50*  ?(Blank rows = not tested) ?  ?Active ROM Right ?02/25/2022  ?Thumb MCP (0-60) (-18*) - 25*  ?Thumb IP (0-80) 0 - 0  ?Thumb Palmar abd/add (0-45) 32 - 40*  ?Thumb Opposition to Index Finger 1.5cm gap today  ?Long MCP (0-90) 17 - 30   ?Long PIP (0-100) 49 - 52  ?Long DIP (0-70) 28 - 36  ?(Blank rows = not tested) ?  ?  ?UE MMT:   NT at eval in thumb, but wrist 2-/5 MMT unable to fully extend in gravity eliminated ?  ?  ?HAND  FUNCTION: ?Grip strength: Right: TBD lbs; Left: TBD lbs ?  ?COORDINATION: ?TBD for [employer] testing as well as 9HPT when able. Observed impairment in coordination in right hand and even wrist drop and poor finger use as well as thumb.  ?  ?SENSATION: ?Impaired in volar thumb now, states he can feel LT in rt hand other fingers volarly though. He does have old wounds on knuckles from injuries he states nt feeling/not realizing (is DM II with neuropathy)  ?  ?EDEMA: 8.5cm circumferential thumb P1 (6.9 in Left hand), 8.6cm circumferential IF P1 (7.4cm in Left hand), whole hand very swollen  ? ?03/05/22: 8.5cm circumferential thumb P1, 8.6cm circumferential IF P1 today as well  ?  ?COGNITION: ?Overall cognitive status: Within functional limits for tasks assessed and some safety awareness issues ?  ?  ?OBSERVATIONS: Pad of thumb volar area covered in black eschar now, very dry with bandage sticking to wound, 2 visible blue sutures present. He has MD appt on Friday when he will likely have them taken out Apparent wrist drop/droop as well.  ?  ?  ?TODAY'S TREATMENT:  ?03/05/22: Wrist ext and FA supination greatly improved now due to HEP compliance and orthotic support. OT reviews self-care and continues with wound wrapping, etc., also applying tighter coban wrap today to help swelling, as measures show no significant change  since last session. He performs gross exercises for shoulder and elbow, followed by detailed, blocked FA, wrist and thumb motion/exercises with 1-1 support and help from OTR, and is also educated on new block

## 2022-03-10 ENCOUNTER — Encounter: Payer: Self-pay | Admitting: Rehabilitative and Restorative Service Providers"

## 2022-03-10 ENCOUNTER — Ambulatory Visit: Payer: Medicare PPO | Admitting: Rehabilitative and Restorative Service Providers"

## 2022-03-10 DIAGNOSIS — M25641 Stiffness of right hand, not elsewhere classified: Secondary | ICD-10-CM | POA: Diagnosis not present

## 2022-03-10 DIAGNOSIS — R278 Other lack of coordination: Secondary | ICD-10-CM | POA: Diagnosis not present

## 2022-03-10 DIAGNOSIS — M79641 Pain in right hand: Secondary | ICD-10-CM | POA: Diagnosis not present

## 2022-03-10 DIAGNOSIS — M6281 Muscle weakness (generalized): Secondary | ICD-10-CM

## 2022-03-10 NOTE — Therapy (Signed)
?OUTPATIENT OCCUPATIONAL THERAPY TREATMENT NOTE ? ? ?Patient Name: Erik Hancock ?MRN: [id number] ?DOB:[birth year], 71 y.o., male ?Today's Date: 03/10/2022 ? ?PCP: [name], MD ?REFERRING PROVIDER: [name],* ? OT End of Session - 03/10/22 0937   ? ? Visit Number 4   ? Number of Visits 12   ? Date for OT Re-Evaluation 04/10/22   ? Authorization Type [employer]   ? OT Start Time [phone]   ? OT Stop Time 1013   ? OT Time Calculation (min) 36 min   ? Equipment Utilized During Treatment wound care materials   ? Activity Tolerance Patient tolerated treatment well;No increased pain;Patient limited by pain   ? Behavior During Therapy [facility] for tasks assessed/performed   ? ?  ?  ? ?  ? ? ? ? ?Past Medical History:  ?Diagnosis Date  ? Arthritis   ? "all over" (07/29/2018)  ? CAD S/P percutaneous coronary angioplasty   ? a. stent to mLAD 11/2001. b. DES to PDA/mLCx 2007. c. DES to ostial LAD 07/2018.  ? Chronic lower back pain   ? CKD (chronic kidney disease), stage III ([place])   ? CKD (chronic kidney disease), stage III ([place])   ? Diabetic retinopathy ([place])   ? Diabetic retinopathy ([place])   ? GERD (gastroesophageal reflux disease)   ? High cholesterol   ? History of kidney stones   ? Hypertension   ? Mobitz type 2 second degree atrioventricular block   ? a. noted during 07/2018 adm, metoprolol discontinued.  ? Morbid obesity ([place]) 07/28/2018  ? Prostatitis   ? Pulmonary nodule 2013  ? CT  ? Sleep apnea   ? stopbang=5  ? Suspected sleep apnea   ? Type II diabetes mellitus ([place])   ? ?Past Surgical History:  ?Procedure Laterality Date  ? CARDIAC CATHETERIZATION  JUNE 2003  ? PATENT LAD STENT/ BODERLINE OBSTRUCTIVE DISEASE POSTERIOR DESCENDING ARTERY/ NORMAL LVF  ? CATARACT EXTRACTION W/ INTRAOCULAR LENS  IMPLANT, BILATERAL Bilateral 2017  ? CERVICAL SCOVILLE FORAMINOTOMY W/ EXCISION OF HERNIATED [place] PULPOSUS  2009  ? C6 - 7  ? CORONARY ANGIOPLASTY WITH STENT PLACEMENT  03/30/2006  ? DR EDMUNDS - 90% LCx@OM2  TAXUS   DES 3.0 X 20 -> 3.25 mm,   95% RPDA -- TAXUS DES 3.0 X 16 --> 3.5 mm  ? CORONARY ANGIOPLASTY WITH STENT PLACEMENT  11/2001  ? (Dr. Ilda Foil for Dr. Radford Pax) - mLAD@D2  - TAXUS EXPRESS DES 3.5 x 15   ? CORONARY ANGIOPLASTY WITH STENT PLACEMENT  07/29/2018  ? CORONARY STENT INTERVENTION N/A 07/29/2018  ? Procedure: CORONARY STENT INTERVENTION;  Surgeon: Leonie Man, MD;  Location: [facility];  Service: Cardiovascular;  Laterality: N/A;  ? CYSTOSCOPY W/ RETROGRADES  05/04/2012  ? Procedure: CYSTOSCOPY WITH RETROGRADE PYELOGRAM;  Surgeon: Molli Hazard, MD;  Location[facility];  Service: Urology;  Laterality: Left;  ? CYSTOSCOPY W/ URETERAL STENT PLACEMENT  05/04/2012  ? Procedure: CYSTOSCOPY WITH STENT REPLACEMENT;  Surgeon: Molli Hazard, MD;  Location: [facility];  Service: Urology;  Laterality: Left;  ? CYSTOSCOPY WITH STENT PLACEMENT Left 04/04/2012  ? CYSTOSCOPY WITH URETEROSCOPY  05/04/2012  ? Procedure: CYSTOSCOPY WITH URETEROSCOPY;  Surgeon: Molli Hazard, MD;  [facility]: [facility];  Service: Urology;  Laterality: Left;   ? ?  ? I & D EXTREMITY Right 02/11/2022  ? Procedure: IRRIGATION AND DEBRIDEMENT  OF HAND AND FOREARM;  Surgeon: Iran Planas, MD;  Location: [place];  Service: Orthopedics;  Laterality: Right;  ? LEFT HEART CATH AND CORONARY ANGIOGRAPHY N/A 07/29/2018  ? Procedure: LEFT HEART CATH AND CORONARY ANGIOGRAPHY;  Surgeon: Leonie Man, MD;  Location: [place];  Service: Cardiovascular;  Laterality: N/A;  ? LEFT URETEROSCOPIC STONE EXTRACTION  03-14-2003  ? X2  ? ?Patient Active Problem List  ? Diagnosis Date Noted  ? OSA (obstructive sleep apnea) 02/12/2022  ? Stage 3a chronic kidney disease (CKD) ([place]) 02/11/2022  ? Tenosynovitis of finger 02/11/2022  ? Metabolic acidosis, increased anion gap 02/11/2022  ? Sepsis due to group A Streptococcus ([place]) 02/11/2022  ? First degree heart block 08/11/2018  ? Mobitz type 2 second degree atrioventricular block 07/30/2018  ?  Angina, class II ([place]) 07/29/2018  ? Morbid obesity ([place]) 07/28/2018  ? Exertional dyspnea 07/28/2018  ? Apnea 07/28/2018  ? Diabetic retinopathy ([place]) 12/24/2016  ? Controlled type 2 diabetes mellitus with hyperglycemia, without long-term current use of insulin ([place]) 02/24/2011  ? Essential hypertension 02/24/2011  ? Dyslipidemia, goal LDL below 70 02/24/2011  ? CAD s/p PCI 2019 02/24/2011  ? GERD (gastroesophageal reflux disease) 02/24/2011  ? ? ?ONSET DATE: 02/11/22 DOS ?  ?REFERRING DIAG: M65.9 (ICD-10-CM) - Tenosynovitis of finger ? ?THERAPY DIAG:  ?Pain in right hand ? ?Other lack of coordination ? ?Muscle weakness (generalized) ? ?Stiffness of right hand, not elsewhere classified ? ? ?PERTINENT HISTORY: Pt has has injures to right hand and past DM II with neuropathy which may inhibit healing / risk for more injury ? ?PRECAUTIONS: 4 weeks since I & D sx, no strong/repetitive grip and pinch advised for 3-4 months, monitor for flexion contractures.  ? ?SUBJECTIVE:  ? He states feeling more pain in his knee now, wishing someone could sx address this now. OT doesn't think that's a good option until he has full use of hands, is not longer infection risk. He states doing HEP and demo's stretching fingers into flexion.  ? ?PAIN:  ?Are you having pain? No ?Rating: 0/10 in right wrist and hand ? ? ?OBJECTIVE: (All objective assessments below are from initial evaluation on: 02/25/22 unless otherwise specified.)  ? ?HAND DOMINANCE: Right ?  ?ADLs: ?Overall ADLs: Unable to use right hand to grab, hold items, open containers now, etc. Quick DASH TBD next session, but he has overtly impaired function now.  ?  ?  ?FUNCTIONAL OUTCOME MEASURES: ?Quick Dash: TBD ?  ?UE ROM   02/25/22: all fingers and thumb very swollen, stiff, MF taken measures symbolic of all fingers today. Thumb measures also taken.  ?  ?Active ROM Right ?02/25/2022 Right ?03/05/22  ?Wrist flexion 47* 41*  ?Wrist extension (-15*) 13* now  ?Wrist ulnar deviation      ?Wrist radial deviation     ?Wrist pronation 85* 85*  ?Wrist supination (-5*) 50*  ?(Blank rows = not tested) ?  ?Active ROM Right ?02/25/2022  ?Thumb MCP (0-60) (-18*) - 25*  ?Thumb IP (0-80) 0 - 0  ?Thumb Palmar abd/add (0-45) 32 - 40*  ?Thumb Opposition to Index Finger 1.5cm gap today  ?Long MCP (0-90) 17 - 30   ?Long PIP (0-100) 49 - 52  ?Long DIP (0-70) 28 - 36  ?(Blank rows = not tested) ?  ?  ?UE MMT:   NT at eval in thumb, but wrist 2-/5 MMT unable to fully extend in gravity eliminated ?  ?  ?HAND FUNCTION: ?Grip strength: Right: TBD lbs; Left: TBD lbs ?  ?COORDINATION: ?TBD for Box & Blocks testing as  well as 9HPT when able. Observed impairment in coordination in right hand and even wrist drop and poor finger use as well as thumb.  ?  ?SENSATION: ?Impaired in volar thumb now, states he can feel LT in rt hand other fingers volarly though. He does have old wounds on knuckles from injuries he states nt feeling/not realizing (is DM II with neuropathy)  ?  ?EDEMA: 8.5cm circumferential thumb P1 (6.9 in Left hand), 8.6cm circumferential IF P1 (7.4cm in Left hand), whole hand very swollen  ? ?03/05/22: 8.5cm circumferential thumb P1, 8.6cm circumferential IF P1 today as well  ? ?  ?COGNITION: ?Overall cognitive status: Within functional limits for tasks assessed and some safety awareness issues ?  ?  ?OBSERVATIONS: 03/10/22: Pad of thumb volar area covered in black eschar now starting to look more moist, healing up well, some areas peeling off with signs of healthy new skin underneath.  ?  ?  ?TODAY'S TREATMENT:  ?03/10/22: OT performs self-care wound care with him again, seeing eschar now looking moist and healing underneath. OT also reforms orthosis due to swelling changes and increased ability to extend wrist. It fits much better after this. Reviews HEP and new blocking at thumb, add light IP thumb stretches, MCP J flexion stretches at fingers, MCP blocking in flexion to work extension of finger IP Js, and review  of wrist AROM and stretches. Also discuss home functional activities to get/keep hand moving and discussion of purpose of orthotics and POC to be moving to prevent contractures, promote healing. Him and wife

## 2022-03-12 ENCOUNTER — Encounter: Payer: Self-pay | Admitting: Rehabilitative and Restorative Service Providers"

## 2022-03-12 ENCOUNTER — Ambulatory Visit: Payer: Medicare PPO | Admitting: Rehabilitative and Restorative Service Providers"

## 2022-03-12 ENCOUNTER — Telehealth: Payer: Self-pay

## 2022-03-12 ENCOUNTER — Other Ambulatory Visit (HOSPITAL_COMMUNITY): Payer: Self-pay

## 2022-03-12 DIAGNOSIS — R278 Other lack of coordination: Secondary | ICD-10-CM

## 2022-03-12 DIAGNOSIS — M6281 Muscle weakness (generalized): Secondary | ICD-10-CM

## 2022-03-12 DIAGNOSIS — M25641 Stiffness of right hand, not elsewhere classified: Secondary | ICD-10-CM | POA: Diagnosis not present

## 2022-03-12 DIAGNOSIS — M79641 Pain in right hand: Secondary | ICD-10-CM | POA: Diagnosis not present

## 2022-03-12 NOTE — Therapy (Signed)
?OUTPATIENT OCCUPATIONAL THERAPY TREATMENT NOTE ? ? ?Patient Name: Erik Hancock ?MRN: [id number] ?DOB:02-24-1951, 71 y.o., male ?Today's Date: 03/12/2022 ? ?PCP: [name], MD ?REFERRING PROVIDER: [name],* ? OT End of Session - 03/12/22 0931   ? ? Visit Number 5   ? Number of Visits 12   ? Date for OT Re-Evaluation 04/10/22   ? Authorization Type [employer]   ? OT Start Time [phone]   ? OT Stop Time 1020   ? OT Time Calculation (min) 49 min   ? Equipment Utilized During Treatment orthotic materials   ? Activity Tolerance Patient tolerated treatment well;No increased pain;Patient limited by pain   ? Behavior During Therapy [facility] for tasks assessed/performed   ? ?  ?  ? ?  ? ? ? ? ? ?Past Medical History:  ?Diagnosis Date  ? Arthritis   ? "all over" (07/29/2018)  ? CAD S/P percutaneous coronary angioplasty   ? a. stent to mLAD 11/2001. b. DES to PDA/mLCx 2007. c. DES to ostial LAD 07/2018.  ? Chronic lower back pain   ? CKD (chronic kidney disease), stage III ([place])   ? CKD (chronic kidney disease), stage III ([place])   ? Diabetic retinopathy ([place])   ? Diabetic retinopathy ([place])   ? GERD (gastroesophageal reflux disease)   ? High cholesterol   ? History of kidney stones   ? Hypertension   ? Mobitz type 2 second degree atrioventricular block   ? a. noted during 07/2018 adm, metoprolol discontinued.  ? Morbid obesity ([place]) 07/28/2018  ? Prostatitis   ? Pulmonary nodule 2013  ? CT  ? Sleep apnea   ? stopbang=5  ? Suspected sleep apnea   ? Type II diabetes mellitus ([place])   ? ?Past Surgical History:  ?Procedure Laterality Date  ? CARDIAC CATHETERIZATION  JUNE 2003  ? PATENT LAD STENT/ BODERLINE OBSTRUCTIVE DISEASE POSTERIOR DESCENDING ARTERY/ NORMAL LVF  ? CATARACT EXTRACTION W/ INTRAOCULAR LENS  IMPLANT, BILATERAL Bilateral 2017  ? CERVICAL SCOVILLE FORAMINOTOMY W/ EXCISION OF HERNIATED [place] PULPOSUS  2009  ? C6 - 7  ? CORONARY ANGIOPLASTY WITH STENT PLACEMENT  03/30/2006  ? DR EDMUNDS - 90% LCx@OM2  TAXUS   DES 3.0 X 20 -> 3.25 mm,   95% RPDA -- TAXUS DES 3.0 X 16 --> 3.5 mm  ? CORONARY ANGIOPLASTY WITH STENT PLACEMENT  11/2001  ? (Dr. Ilda Foil for Dr. Radford Pax) - mLAD@D2  - TAXUS EXPRESS DES 3.5 x 15   ? CORONARY ANGIOPLASTY WITH STENT PLACEMENT  07/29/2018  ? CORONARY STENT INTERVENTION N/A 07/29/2018  ? Procedure: CORONARY STENT INTERVENTION;  Surgeon: Leonie Man, MD;  Location: [facility];  Service: Cardiovascular;  Laterality: N/A;  ? CYSTOSCOPY W/ RETROGRADES  05/04/2012  ? Procedure: CYSTOSCOPY WITH RETROGRADE PYELOGRAM;  Surgeon: Molli Hazard, MD;  [facility];  Service: Urology;  Laterality: Left;  ? CYSTOSCOPY W/ URETERAL STENT PLACEMENT  05/04/2012  ? Procedure: CYSTOSCOPY WITH STENT REPLACEMENT;  Surgeon: Molli Hazard, MD;  [facility];  Service: Urology;  Laterality: Left;  ? CYSTOSCOPY WITH STENT PLACEMENT Left 04/04/2012  ? CYSTOSCOPY WITH URETEROSCOPY  05/04/2012  ? Procedure: CYSTOSCOPY WITH URETEROSCOPY;  Surgeon: Molli Hazard, MD;  [facility];  Service: Urology;  Laterality: Left;   ? ?  ? I & D EXTREMITY Right 02/11/2022  ? Procedure: IRRIGATION AND DEBRIDEMENT  OF HAND AND FOREARM;  Surgeon: Iran Planas, MD;  Location: [facility];  Service: Orthopedics;  Laterality: Right;  ? LEFT HEART CATH AND CORONARY ANGIOGRAPHY N/A 07/29/2018  ? Procedure: LEFT HEART CATH AND CORONARY ANGIOGRAPHY;  Surgeon: Leonie Man, MD;  Location: [facility];  Service: Cardiovascular;  Laterality: N/A;  ? LEFT URETEROSCOPIC STONE EXTRACTION  03-14-2003  ? X2  ? ?Patient Active Problem List  ? Diagnosis Date Noted  ? OSA (obstructive sleep apnea) 02/12/2022  ? Stage 3a chronic kidney disease (CKD) ([place]) 02/11/2022  ? Tenosynovitis of finger 02/11/2022  ? Metabolic acidosis, increased anion gap 02/11/2022  ? Sepsis due to group A Streptococcus ([place]) 02/11/2022  ? First degree heart block 08/11/2018  ? Mobitz type 2 second degree atrioventricular block 07/30/2018  ?  Angina, class II ([place]) 07/29/2018  ? Morbid obesity ([place]) 07/28/2018  ? Exertional dyspnea 07/28/2018  ? Apnea 07/28/2018  ? Diabetic retinopathy ([place]) 12/24/2016  ? Controlled type 2 diabetes mellitus with hyperglycemia, without long-term current use of insulin ([place]) 02/24/2011  ? Essential hypertension 02/24/2011  ? Dyslipidemia, goal LDL below 70 02/24/2011  ? CAD s/p PCI 2019 02/24/2011  ? GERD (gastroesophageal reflux disease) 02/24/2011  ? ? ?ONSET DATE: 02/11/22 DOS ?  ?REFERRING DIAG: M65.9 (ICD-10-CM) - Tenosynovitis of finger ? ?THERAPY DIAG:  ?Pain in right hand ? ?Other lack of coordination ? ?Muscle weakness (generalized) ? ?Stiffness of right hand, not elsewhere classified ? ? ?PERTINENT HISTORY: Pt has has injures to right hand and past DM II with neuropathy which may inhibit healing / risk for more injury ? ?PRECAUTIONS: 4 weeks since I & D sx, no strong/repetitive grip and pinch advised for 3-4 months, monitor for flexion contractures.  ? ?SUBJECTIVE:  ?He arrives by himself today after driving and now using crutches to get around rather than transport w/c. That's very good to see. He state MD appt tomorrow and possible MRI on Monday. He states trying to stretch fingers but they are very tight.  ? ? ?PAIN:  ?Are you having pain? Yes, but only in knee today, and not hurting at rest  ?Rating: 0/10 in right wrist and hand ? ? ?OBJECTIVE: (All objective assessments below are from initial evaluation on: 02/25/22 unless otherwise specified.)  ? ?HAND DOMINANCE: Right ?  ?ADLs: ?Overall ADLs: Unable to use right hand to grab, hold items, open containers now, etc. Quick DASH TBD next session, but he has overtly impaired function now.  ?  ?  ?FUNCTIONAL OUTCOME MEASURES: ?Quick Dash: 59% impairment today - 03/12/22 ?  ?UE ROM   02/25/22: all fingers and thumb very swollen, stiff, MF taken measures symbolic of all fingers today. Thumb measures also taken.  ?  ?Active ROM Right ?02/25/2022 Right ?03/05/22  ?Wrist  flexion 47* 41*  ?Wrist extension (-15*) 13* now  ?Wrist ulnar deviation     ?Wrist radial deviation     ?Wrist pronation 85* 85*  ?Wrist supination (-5*) 50*  ?(Blank rows = not tested) ?  ?Active ROM Right ?02/25/2022  ?Thumb MCP (0-60) (-18*) - 25*  ?Thumb IP (0-80) 0 - 0  ?Thumb Palmar abd/add (0-45) 32 - 40*  ?Thumb Opposition to Index Finger 1.5cm gap today  ?Long MCP (0-90) 17 - 30   ?Long PIP (0-100) 49 - 52  ?Long DIP (0-70) 28 - 36  ?(Blank rows = not tested) ?  ?  ?UE MMT:   NT at eval in thumb, but wrist 2-/5 MMT unable to fully extend in gravity eliminated ?  ?  ?HAND FUNCTION: ?Grip strength: Right: TBD  lbs; Left: TBD lbs ?  ?COORDINATION: ?TBD for Box & Blocks testing as well as 9HPT when able. Observed impairment in coordination in right hand and even wrist drop and poor finger use as well as thumb.  ?  ?SENSATION: ?Impaired in volar thumb now, states he can feel LT in rt hand other fingers volarly though. He does have old wounds on knuckles from injuries he states nt feeling/not realizing (is DM II with neuropathy)  ?  ?EDEMA: 8.5cm circumferential thumb P1 (6.9 in Left hand), 8.6cm circumferential IF P1 (7.4cm in Left hand), whole hand very swollen  ? ?03/05/22: 8.5cm circumferential thumb P1, 8.6cm circumferential IF P1 today as well  ? ?  ?COGNITION: ?Overall cognitive status: Within functional limits for tasks assessed and some safety awareness issues ?  ?  ?OBSERVATIONS: 03/10/22: Pad of thumb volar area covered in black eschar now starting to look more moist, healing up well, some areas peeling off with signs of healthy new skin underneath.  ?  ?  ?TODAY'S TREATMENT:  ?03/12/22: Custom orthotic fabrication was indicated due to pt's forming finger flexion contractures and need for safe, functional positioning. OT fabricated custom resting hand and thumb and wrist extension orthotic for pt today. It fit well with no areas of pressure, puts pressure on fingers to extend, holds thumb somewhat abducted  and open webspace, holds wrist in neutral or slight extension. Pt states a comfortable fit at least for now. Pt was educated on the wearing schedule, to call or come in ASAP if it is causing any irritation or i

## 2022-03-12 NOTE — Telephone Encounter (Signed)
I spoke to the patient's wife and relay lab results. Patient's wife stated patient is seeing the surgeon tomorrow and stated the surgeon stated the patient may not need the MRI. Patient's wife will call us and let us know ?

## 2022-03-12 NOTE — Telephone Encounter (Signed)
-----   Message from Danelle Earthly, MD sent at 03/12/2022  2:28 PM EDT ----- ?Stable labs. Await MRI ?

## 2022-03-16 ENCOUNTER — Ambulatory Visit: Admission: RE | Admit: 2022-03-16 | Payer: Medicare PPO | Source: Ambulatory Visit

## 2022-03-17 ENCOUNTER — Encounter: Payer: Self-pay | Admitting: Rehabilitative and Restorative Service Providers"

## 2022-03-17 ENCOUNTER — Ambulatory Visit: Payer: Medicare PPO | Admitting: Rehabilitative and Restorative Service Providers"

## 2022-03-17 DIAGNOSIS — M6281 Muscle weakness (generalized): Secondary | ICD-10-CM | POA: Diagnosis not present

## 2022-03-17 DIAGNOSIS — R278 Other lack of coordination: Secondary | ICD-10-CM | POA: Diagnosis not present

## 2022-03-17 DIAGNOSIS — M25641 Stiffness of right hand, not elsewhere classified: Secondary | ICD-10-CM | POA: Diagnosis not present

## 2022-03-17 DIAGNOSIS — M79641 Pain in right hand: Secondary | ICD-10-CM

## 2022-03-17 NOTE — Therapy (Signed)
?OUTPATIENT OCCUPATIONAL THERAPY TREATMENT NOTE ? ? ?Patient Name: Erik Hancock ?MRN: [id number] ?DOB:[birth year], 71 y.o., male ?Today's Date: 03/17/2022 ? ?PCP: [name], MD ?REFERRING PROVIDER: [name],* ? OT End of Session - 03/17/22 0920   ? ? Visit Number 6   ? Number of Visits 12   ? Date for OT Re-Evaluation 04/10/22   ? Authorization Type [employer]   ? OT Start Time [phone]   ? OT Stop Time 1013   ? OT Time Calculation (min) 52 min   ? Equipment Utilized During Treatment --   ? Activity Tolerance Patient tolerated treatment well;No increased pain   ? Behavior During Therapy [facility] for tasks assessed/performed   ? ?  ?  ? ?  ? ? ? ? ? ? ?Past Medical History:  ?Diagnosis Date  ? Arthritis   ? "all over" (07/29/2018)  ? CAD S/P percutaneous coronary angioplasty   ? a. stent to mLAD 11/2001. b. DES to PDA/mLCx 2007. c. DES to ostial LAD 07/2018.  ? Chronic lower back pain   ? CKD (chronic kidney disease), stage III ([place])   ? CKD (chronic kidney disease), stage III ([place])   ? Diabetic retinopathy ([place])   ? Diabetic retinopathy ([place])   ? GERD (gastroesophageal reflux disease)   ? High cholesterol   ? History of kidney stones   ? Hypertension   ? Mobitz type 2 second degree atrioventricular block   ? a. noted during 07/2018 adm, metoprolol discontinued.  ? Morbid obesity ([place]) 07/28/2018  ? Prostatitis   ? Pulmonary nodule 2013  ? CT  ? Sleep apnea   ? stopbang=5  ? Suspected sleep apnea   ? Type II diabetes mellitus ([place])   ? ?Past Surgical History:  ?Procedure Laterality Date  ? CARDIAC CATHETERIZATION  JUNE 2003  ? PATENT LAD STENT/ BODERLINE OBSTRUCTIVE DISEASE POSTERIOR DESCENDING ARTERY/ NORMAL LVF  ? CATARACT EXTRACTION W/ INTRAOCULAR LENS  IMPLANT, BILATERAL Bilateral 2017  ? CERVICAL SCOVILLE FORAMINOTOMY W/ EXCISION OF HERNIATED [place] PULPOSUS  2009  ? C6 - 7  ? CORONARY ANGIOPLASTY WITH STENT PLACEMENT  03/30/2006  ? DR EDMUNDS - 90% LCx@OM2  TAXUS  DES 3.0 X 20 -> 3.25 mm,   95% RPDA --  TAXUS DES 3.0 X 16 --> 3.5 mm  ? CORONARY ANGIOPLASTY WITH STENT PLACEMENT  11/2001  ? (Dr. Ilda Foil for Dr. Radford Pax) - mLAD@D2  - TAXUS EXPRESS DES 3.5 x 15   ? CORONARY ANGIOPLASTY WITH STENT PLACEMENT  07/29/2018  ? CORONARY STENT INTERVENTION N/A 07/29/2018  ? Procedure: CORONARY STENT INTERVENTION;  Surgeon: Leonie Man, MD;  Location: [place] [facility];  Service: Cardiovascular;  Laterality: N/A;  ? CYSTOSCOPY W/ RETROGRADES  05/04/2012  ? Procedure: CYSTOSCOPY WITH RETROGRADE PYELOGRAM;  Surgeon: Molli Hazard, MD;  [facility];  Service: Urology;  Laterality: Left;  ? CYSTOSCOPY W/ URETERAL STENT PLACEMENT  05/04/2012  ? Procedure: CYSTOSCOPY WITH STENT REPLACEMENT;  Surgeon: Molli Hazard, MD;  [facility];  Service: Urology;  Laterality: Left;  ? CYSTOSCOPY WITH STENT PLACEMENT Left 04/04/2012  ? CYSTOSCOPY WITH URETEROSCOPY  05/04/2012  ? Procedure: CYSTOSCOPY WITH URETEROSCOPY;  Surgeon: Molli Hazard, MD;  [facility];  Service: Urology;  Laterality: Left;   ? ?  ? I & D EXTREMITY Right 02/11/2022  ? Procedure: IRRIGATION AND DEBRIDEMENT  OF HAND AND FOREARM;  Surgeon: Iran Planas, MD;  Location: [place];  Service: Orthopedics;  Laterality: Right;  ?  LEFT HEART CATH AND CORONARY ANGIOGRAPHY N/A 07/29/2018  ? Procedure: LEFT HEART CATH AND CORONARY ANGIOGRAPHY;  Surgeon: Leonie Man, MD;  Location: [place] [facility];  Service: Cardiovascular;  Laterality: N/A;  ? LEFT URETEROSCOPIC STONE EXTRACTION  03-14-2003  ? X2  ? ?Patient Active Problem List  ? Diagnosis Date Noted  ? OSA (obstructive sleep apnea) 02/12/2022  ? Stage 3a chronic kidney disease (CKD) ([place]) 02/11/2022  ? Tenosynovitis of finger 02/11/2022  ? Metabolic acidosis, increased anion gap 02/11/2022  ? Sepsis due to group A Streptococcus ([place]) 02/11/2022  ? First degree heart block 08/11/2018  ? Mobitz type 2 second degree atrioventricular block 07/30/2018  ? Angina, class II ([place]) 07/29/2018  ? Morbid  obesity ([place]) 07/28/2018  ? Exertional dyspnea 07/28/2018  ? Apnea 07/28/2018  ? Diabetic retinopathy ([place]) 12/24/2016  ? Controlled type 2 diabetes mellitus with hyperglycemia, without long-term current use of insulin ([place]) 02/24/2011  ? Essential hypertension 02/24/2011  ? Dyslipidemia, goal LDL below 70 02/24/2011  ? CAD s/p PCI 2019 02/24/2011  ? GERD (gastroesophageal reflux disease) 02/24/2011  ? ? ?ONSET DATE: 02/11/22 DOS ?  ?REFERRING DIAG: M65.9 (ICD-10-CM) - Tenosynovitis of finger ? ?THERAPY DIAG:  ?Pain in right hand ? ?Other lack of coordination ? ?Muscle weakness (generalized) ? ?Stiffness of right hand, not elsewhere classified ? ? ?PERTINENT HISTORY: Pt has has injures to right hand and past DM II with neuropathy which may inhibit healing / risk for more injury ? ?PRECAUTIONS: 5 weeks since I & D sx, no strong/repetitive grip and pinch advised for 3-4 months, monitor for flexion contractures.  ? ?SUBJECTIVE:  ?He states nothing new, but is again in travel W/C and stating he can't walk due to knee pain. Wife present again today. He states new orthotic is fitting well. He is still holding around his right wrist with left hand in guarded position at rest today, likely showing a fear and disuse still.  ? ? ?PAIN:  ?Are you having pain?  Yes, but only in knee today, and not hurting at rest  ?Rating: 0/10 in right wrist and hand ? ? ?OBJECTIVE: (All objective assessments below are from initial evaluation on: 02/25/22 unless otherwise specified.)  ? ?HAND DOMINANCE: Right ?  ?ADLs: ?Overall ADLs: Unable to use right hand to grab, hold items, open containers now, etc. Quick DASH TBD next session, but he has overtly impaired function now.  ?  ?  ?FUNCTIONAL OUTCOME MEASURES: ?Quick Dash: 59% impairment today - 03/12/22 ?  ?UE ROM   02/25/22: all fingers and thumb very swollen, stiff, MF taken measures symbolic of all fingers today. Thumb measures also taken.  ?  ?Active ROM Right ?02/25/2022 Right ?03/05/22  ?Wrist  flexion 47* 41*  ?Wrist extension (-15*) 13* now  ?Wrist ulnar deviation     ?Wrist radial deviation     ?Wrist pronation 85* 85*  ?Wrist supination (-5*) 50*  ?(Blank rows = not tested) ?  ?Active ROM Right ?02/25/2022  ?Thumb MCP (0-60) (-18*) - 25*  ?Thumb IP (0-80) 0 - 0  ?Thumb Palmar abd/add (0-45) 32 - 40*  ?Thumb Opposition to Index Finger 1.5cm gap today  ?Long MCP (0-90) 17 - 30   ?Long PIP (0-100) 49 - 52  ?Long DIP (0-70) 28 - 36  ?(Blank rows = not tested) ?  ?  ?UE MMT:   NT at eval in thumb, but wrist 2-/5 MMT unable to fully extend in gravity eliminated ?  ?  ?  HAND FUNCTION: ?Grip strength: Right: TBD lbs; Left: TBD lbs ?  ?COORDINATION: ?TBD for Box & Blocks testing as well as 9HPT when able. Observed impairment in coordination in right hand and even wrist drop and poor finger use as well as thumb.  ? ?03/17/22: Box & Bocks: 17 blocks today  ?  ?SENSATION: ?Impaired in volar thumb now, states he can feel LT in rt hand other fingers volarly though. He does have old wounds on knuckles from injuries he states nt feeling/not realizing (is DM II with neuropathy)  ?  ?EDEMA: 8.5cm circumferential thumb P1 (6.9 in Left hand), 8.6cm circumferential IF P1 (7.4cm in Left hand), whole hand very swollen  ? ?03/05/22: 8.5cm circumferential thumb P1, 8.6cm circumferential IF P1 today as well  ? ?  ?COGNITION: ?Overall cognitive status: Within functional limits for tasks assessed and some safety awareness issues ?  ?  ?OBSERVATIONS: 03/10/22: Pad of thumb volar area covered in black eschar now starting to look more moist, healing up well, some areas peeling off with signs of healthy new skin underneath.  ?  ?  ?TODAY'S TREATMENT:  ?03/17/22:  New orthotic looks great, OT checks wound which is also still healing. He reports that his surgeon mentioned that a skin graft may become necessary at last f/u. OT reviews self-care for dressing changes, keeping wound moist with xeroform but not overly so, etc. OT reviews stretches  at MCP Js, in composite flexion, in ext at PIP Js and wrist flex, ext. Also FA AROM in pro, supination. He is again encouraged to hold AROM with tension for 3-5 seconds, do 10-15x per HEP session, al

## 2022-03-19 ENCOUNTER — Encounter: Payer: Self-pay | Admitting: Rehabilitative and Restorative Service Providers"

## 2022-03-19 ENCOUNTER — Ambulatory Visit: Payer: Medicare PPO | Admitting: Internal Medicine

## 2022-03-19 ENCOUNTER — Ambulatory Visit: Payer: Medicare PPO | Admitting: Rehabilitative and Restorative Service Providers"

## 2022-03-19 DIAGNOSIS — M25641 Stiffness of right hand, not elsewhere classified: Secondary | ICD-10-CM | POA: Diagnosis not present

## 2022-03-19 DIAGNOSIS — R278 Other lack of coordination: Secondary | ICD-10-CM

## 2022-03-19 DIAGNOSIS — M79641 Pain in right hand: Secondary | ICD-10-CM

## 2022-03-19 DIAGNOSIS — M6281 Muscle weakness (generalized): Secondary | ICD-10-CM

## 2022-03-19 NOTE — Therapy (Signed)
?OUTPATIENT OCCUPATIONAL THERAPY TREATMENT NOTE ? ? ?Patient Name: Erik Hancock ?MRN: [id number] ?DOB:[birth year], 71 y.o., male ?Today's Date: 03/19/2022 ? ?PCP: [name], MD ?REFERRING PROVIDER: [name],* ? OT End of Session - 03/19/22 0931   ? ? Visit Number 7   ? Number of Visits 12   ? Date for OT Re-Evaluation 04/10/22   ? Authorization Type [employer]   ? OT Start Time [phone]   ? OT Stop Time 1016   ? OT Time Calculation (min) 45 min   ? Activity Tolerance Patient tolerated treatment well;No increased pain   ? Behavior During Therapy [facility] for tasks assessed/performed   ? ?  ?  ? ?  ? ? ? ? ? ? ? ?Past Medical History:  ?Diagnosis Date  ? Arthritis   ? "all over" (07/29/2018)  ? CAD S/P percutaneous coronary angioplasty   ? a. stent to mLAD 11/2001. b. DES to PDA/mLCx 2007. c. DES to ostial LAD 07/2018.  ? Chronic lower back pain   ? CKD (chronic kidney disease), stage III ([place])   ? CKD (chronic kidney disease), stage III ([place])   ? Diabetic retinopathy ([place])   ? Diabetic retinopathy ([place])   ? GERD (gastroesophageal reflux disease)   ? High cholesterol   ? History of kidney stones   ? Hypertension   ? Mobitz type 2 second degree atrioventricular block   ? a. noted during 07/2018 adm, metoprolol discontinued.  ? Morbid obesity ([place]) 07/28/2018  ? Prostatitis   ? Pulmonary nodule 2013  ? CT  ? Sleep apnea   ? stopbang=5  ? Suspected sleep apnea   ? Type II diabetes mellitus ([place])   ? ?Past Surgical History:  ?Procedure Laterality Date  ? CARDIAC CATHETERIZATION  JUNE 2003  ? PATENT LAD STENT/ BODERLINE OBSTRUCTIVE DISEASE POSTERIOR DESCENDING ARTERY/ NORMAL LVF  ? CATARACT EXTRACTION W/ INTRAOCULAR LENS  IMPLANT, BILATERAL Bilateral 2017  ? CERVICAL SCOVILLE FORAMINOTOMY W/ EXCISION OF HERNIATED [place] PULPOSUS  2009  ? C6 - 7  ? CORONARY ANGIOPLASTY WITH STENT PLACEMENT  03/30/2006  ? DR EDMUNDS - 90% LCx@OM2  TAXUS  DES 3.0 X 20 -> 3.25 mm,   95% RPDA -- TAXUS DES 3.0 X 16 --> 3.5 mm  ?  CORONARY ANGIOPLASTY WITH STENT PLACEMENT  11/2001  ? (Dr. Ilda Foil for Dr. Radford Pax) - mLAD@D2  - TAXUS EXPRESS DES 3.5 x 15   ? CORONARY ANGIOPLASTY WITH STENT PLACEMENT  07/29/2018  ? CORONARY STENT INTERVENTION N/A 07/29/2018  ? Procedure: CORONARY STENT INTERVENTION;  Surgeon: Leonie Man, MD;  Location: [place] [facility];  Service: Cardiovascular;  Laterality: N/A;  ? CYSTOSCOPY W/ RETROGRADES  05/04/2012  ? Procedure: CYSTOSCOPY WITH RETROGRADE PYELOGRAM;  Surgeon: Molli Hazard, MD;  [facility];  Service: Urology;  Laterality: Left;  ? CYSTOSCOPY W/ URETERAL STENT PLACEMENT  05/04/2012  ? Procedure: CYSTOSCOPY WITH STENT REPLACEMENT;  Surgeon: Molli Hazard, MD;  [facility];  Service: Urology;  Laterality: Left;  ? CYSTOSCOPY WITH STENT PLACEMENT Left 04/04/2012  ? CYSTOSCOPY WITH URETEROSCOPY  05/04/2012  ? Procedure: CYSTOSCOPY WITH URETEROSCOPY;  Surgeon: Molli Hazard, MD;  [facility];  Service: Urology;  Laterality: Left;   ? ?  ? I & D EXTREMITY Right 02/11/2022  ? Procedure: IRRIGATION AND DEBRIDEMENT  OF HAND AND FOREARM;  Surgeon: Iran Planas, MD;  Location: [place];  Service: Orthopedics;  Laterality: Right;  ? LEFT HEART CATH AND CORONARY  ANGIOGRAPHY N/A 07/29/2018  ? Procedure: LEFT HEART CATH AND CORONARY ANGIOGRAPHY;  Surgeon: Leonie Man, MD;  Location: [place] [facility];  Service: Cardiovascular;  Laterality: N/A;  ? LEFT URETEROSCOPIC STONE EXTRACTION  03-14-2003  ? X2  ? ?Patient Active Problem List  ? Diagnosis Date Noted  ? OSA (obstructive sleep apnea) 02/12/2022  ? Stage 3a chronic kidney disease (CKD) ([place]) 02/11/2022  ? Tenosynovitis of finger 02/11/2022  ? Metabolic acidosis, increased anion gap 02/11/2022  ? Sepsis due to group A Streptococcus ([place]) 02/11/2022  ? First degree heart block 08/11/2018  ? Mobitz type 2 second degree atrioventricular block 07/30/2018  ? Angina, class II ([place]) 07/29/2018  ? Morbid obesity ([place]) 07/28/2018  ?  Exertional dyspnea 07/28/2018  ? Apnea 07/28/2018  ? Diabetic retinopathy ([place]) 12/24/2016  ? Controlled type 2 diabetes mellitus with hyperglycemia, without long-term current use of insulin ([place]) 02/24/2011  ? Essential hypertension 02/24/2011  ? Dyslipidemia, goal LDL below 70 02/24/2011  ? CAD s/p PCI 2019 02/24/2011  ? GERD (gastroesophageal reflux disease) 02/24/2011  ? ? ?ONSET DATE: 02/11/22 DOS ?  ?REFERRING DIAG: M65.9 (ICD-10-CM) - Tenosynovitis of finger ? ?THERAPY DIAG:  ?Pain in right hand ? ?Other lack of coordination ? ?Muscle weakness (generalized) ? ?Stiffness of right hand, not elsewhere classified ? ? ?PERTINENT HISTORY: Pt has has injures to right hand and past DM II with neuropathy which may inhibit healing / risk for more injury ? ?PRECAUTIONS: 5 weeks since I & D sx, no strong/repetitive grip and pinch advised for 3-4 months, monitor for flexion contractures.  ? ?SUBJECTIVE:  ?He states nothing new, has been trying to do more with his arm at home. He states his knee pain is "killing him." Wife is here today and he is in travel w/c now.  ? ? ?PAIN:  ?Are you having pain? Yes, but only in knee today, and not hurting at rest  ?Rating: 0/10 in right wrist and hand ? ? ?OBJECTIVE: (All objective assessments below are from initial evaluation on: 02/25/22 unless otherwise specified.)  ? ?HAND DOMINANCE: Right ?  ?ADLs: ?Overall ADLs: Unable to use right hand to grab, hold items, open containers now, etc. Quick DASH TBD next session, but he has overtly impaired function now.  ?  ?  ?FUNCTIONAL OUTCOME MEASURES: ?Quick Dash: 59% impairment today - 03/12/22 ?  ?UE ROM   02/25/22: all fingers and thumb very swollen, stiff, MF taken measures symbolic of all fingers today. Thumb measures also taken.  ?  ?Active ROM Right ?02/25/2022 Right ?03/05/22  ?Wrist flexion 47* 41*  ?Wrist extension (-15*) 13* now  ?Wrist ulnar deviation     ?Wrist radial deviation     ?Wrist pronation 85* 85*  ?Wrist supination (-5*) 50*   ?(Blank rows = not tested) ?  ?Active ROM Right ?02/25/2022  ?Thumb MCP (0-60) (-18*) - 25*  ?Thumb IP (0-80) 0 - 0  ?Thumb Palmar abd/add (0-45) 32 - 40*  ?Thumb Opposition to Index Finger 1.5cm gap today  ?Long MCP (0-90) 17 - 30   ?Long PIP (0-100) 49 - 52  ?Long DIP (0-70) 28 - 36  ?(Blank rows = not tested) ?  ?  ?UE MMT:   NT at eval in thumb, but wrist 2-/5 MMT unable to fully extend in gravity eliminated ?  ?  ?HAND FUNCTION: ?Grip strength: Right: TBD lbs; Left: TBD lbs ?  ?COORDINATION: ?TBD for Box & Blocks testing as well as 9HPT when able.  Observed impairment in coordination in right hand and even wrist drop and poor finger use as well as thumb.  ? ?03/17/22: Box & Bocks: 17 blocks today  ?  ?SENSATION: ?Impaired in volar thumb now, states he can feel LT in rt hand other fingers volarly though. He does have old wounds on knuckles from injuries he states nt feeling/not realizing (is DM II with neuropathy)  ?  ?EDEMA: 8.5cm circumferential thumb P1 (6.9 in Left hand), 8.6cm circumferential IF P1 (7.4cm in Left hand), whole hand very swollen  ? ?03/05/22: 8.5cm circumferential thumb P1, 8.6cm circumferential IF P1 today as well  ? ?  ?COGNITION: ?Overall cognitive status: Within functional limits for tasks assessed and some safety awareness issues ?  ?  ?OBSERVATIONS: 03/10/22: Pad of thumb volar area covered in black eschar now starting to look more moist, healing up well, some areas peeling off with signs of healthy new skin underneath.  ?  ?  ?TODAY'S TREATMENT:  ?03/19/22: OT checks wound, applies new xeroform and gauze before performing manual therapy IASTM down volar and dorsal forearm concurrently with manual stretches to wrist (in flexion and extension) and fingers (MCP flexion and IP Ext). OT notes that he is quite guarded but does seem to relax better into stretches with IASTM. OT spends a significant amount of time on this today before he does fnl activities with pegs and is edu on some in-hand  manipulations to try. Also reviews HEP stretches he should be performing as often as tolerated.  ? ?03/17/22:  New orthotic looks great, OT checks wound which is also still healing. He reports that his surgeon mentioned

## 2022-03-24 ENCOUNTER — Encounter: Payer: Self-pay | Admitting: Rehabilitative and Restorative Service Providers"

## 2022-03-24 ENCOUNTER — Ambulatory Visit: Payer: Medicare PPO | Admitting: Rehabilitative and Restorative Service Providers"

## 2022-03-24 DIAGNOSIS — M6281 Muscle weakness (generalized): Secondary | ICD-10-CM | POA: Diagnosis not present

## 2022-03-24 DIAGNOSIS — M25641 Stiffness of right hand, not elsewhere classified: Secondary | ICD-10-CM

## 2022-03-24 DIAGNOSIS — R278 Other lack of coordination: Secondary | ICD-10-CM | POA: Diagnosis not present

## 2022-03-24 DIAGNOSIS — M79641 Pain in right hand: Secondary | ICD-10-CM

## 2022-03-24 NOTE — Therapy (Signed)
?OUTPATIENT OCCUPATIONAL THERAPY TREATMENT NOTE ? ? ?Patient Name: Erik Hancock ?MRN: [id number] ?DOB:Sep 08, 1951, 71 y.o., male ?Today's Date: 03/24/2022 ? ?PCP: [name], MD ?REFERRING PROVIDER: [name],* ? OT End of Session - 03/24/22 0947   ? ? Visit Number 8   ? Number of Visits 12   ? Date for OT Re-Evaluation 04/10/22   ? Authorization Type [employer]   ? OT Start Time 0940   ? OT Stop Time 1016   ? OT Time Calculation (min) 36 min   ? Activity Tolerance Patient tolerated treatment well;No increased pain   ? Behavior During Therapy [facility] for tasks assessed/performed   ? ?  ?  ? ?  ? ? ? ? ? ? ? ? ?Past Medical History:  ?Diagnosis Date  ? Arthritis   ? "all over" (07/29/2018)  ? CAD S/P percutaneous coronary angioplasty   ? a. stent to mLAD 11/2001. b. DES to PDA/mLCx 2007. c. DES to ostial LAD 07/2018.  ? Chronic lower back pain   ? CKD (chronic kidney disease), stage III ([place])   ? CKD (chronic kidney disease), stage III ([place])   ? Diabetic retinopathy ([place])   ? Diabetic retinopathy ([place])   ? GERD (gastroesophageal reflux disease)   ? High cholesterol   ? History of kidney stones   ? Hypertension   ? Mobitz type 2 second degree atrioventricular block   ? a. noted during 07/2018 adm, metoprolol discontinued.  ? Morbid obesity ([place]) 07/28/2018  ? Prostatitis   ? Pulmonary nodule 2013  ? CT  ? Sleep apnea   ? stopbang=5  ? Suspected sleep apnea   ? Type II diabetes mellitus ([place])   ? ?Past Surgical History:  ?Procedure Laterality Date  ? CARDIAC CATHETERIZATION  JUNE 2003  ? PATENT LAD STENT/ BODERLINE OBSTRUCTIVE DISEASE POSTERIOR DESCENDING ARTERY/ NORMAL LVF  ? CATARACT EXTRACTION W/ INTRAOCULAR LENS  IMPLANT, BILATERAL Bilateral 2017  ? CERVICAL SCOVILLE FORAMINOTOMY W/ EXCISION OF HERNIATED [place] PULPOSUS  2009  ? C6 - 7  ? CORONARY ANGIOPLASTY WITH STENT PLACEMENT  03/30/2006  ? DR EDMUNDS - 90% LCx@OM2  TAXUS  DES 3.0 X 20 -> 3.25 mm,   95% RPDA -- TAXUS DES 3.0 X 16 --> 3.5 mm  ?  CORONARY ANGIOPLASTY WITH STENT PLACEMENT  11/2001  ? (Dr. Ilda Foil for Dr. Radford Pax) - mLAD@D2  - TAXUS EXPRESS DES 3.5 x 15   ? CORONARY ANGIOPLASTY WITH STENT PLACEMENT  07/29/2018  ? CORONARY STENT INTERVENTION N/A 07/29/2018  ? Procedure: CORONARY STENT INTERVENTION;  Surgeon: Leonie Man, MD;  Location: [place] [facility];  Service: Cardiovascular;  Laterality: N/A;  ? CYSTOSCOPY W/ RETROGRADES  05/04/2012  ? Procedure: CYSTOSCOPY WITH RETROGRADE PYELOGRAM;  Surgeon: Molli Hazard, MD;  [facility];  Service: Urology;  Laterality: Left;  ? CYSTOSCOPY W/ URETERAL STENT PLACEMENT  05/04/2012  ? Procedure: CYSTOSCOPY WITH STENT REPLACEMENT;  Surgeon: Molli Hazard, MD;  [facility];  Service: Urology;  Laterality: Left;  ? CYSTOSCOPY WITH STENT PLACEMENT Left 04/04/2012  ? CYSTOSCOPY WITH URETEROSCOPY  05/04/2012  ? Procedure: CYSTOSCOPY WITH URETEROSCOPY;  Surgeon: Molli Hazard, MD;  [facility];  Service: Urology;  Laterality: Left;   ? ?  ? I & D EXTREMITY Right 02/11/2022  ? Procedure: IRRIGATION AND DEBRIDEMENT  OF HAND AND FOREARM;  Surgeon: Iran Planas, MD;  Location: [place];  Service: Orthopedics;  Laterality: Right;  ? LEFT HEART CATH AND  CORONARY ANGIOGRAPHY N/A 07/29/2018  ? Procedure: LEFT HEART CATH AND CORONARY ANGIOGRAPHY;  Surgeon: Leonie Man, MD;  Location: [place] [facility];  Service: Cardiovascular;  Laterality: N/A;  ? LEFT URETEROSCOPIC STONE EXTRACTION  03-14-2003  ? X2  ? ?Patient Active Problem List  ? Diagnosis Date Noted  ? OSA (obstructive sleep apnea) 02/12/2022  ? Stage 3a chronic kidney disease (CKD) ([place]) 02/11/2022  ? Tenosynovitis of finger 02/11/2022  ? Metabolic acidosis, increased anion gap 02/11/2022  ? Sepsis due to group A Streptococcus ([place]) 02/11/2022  ? First degree heart block 08/11/2018  ? Mobitz type 2 second degree atrioventricular block 07/30/2018  ? Angina, class II ([place]) 07/29/2018  ? Morbid obesity ([place]) 07/28/2018  ?  Exertional dyspnea 07/28/2018  ? Apnea 07/28/2018  ? Diabetic retinopathy ([place]) 12/24/2016  ? Controlled type 2 diabetes mellitus with hyperglycemia, without long-term current use of insulin ([place]) 02/24/2011  ? Essential hypertension 02/24/2011  ? Dyslipidemia, goal LDL below 70 02/24/2011  ? CAD s/p PCI 2019 02/24/2011  ? GERD (gastroesophageal reflux disease) 02/24/2011  ? ? ?ONSET DATE: 02/11/22 DOS ?  ?REFERRING DIAG: M65.9 (ICD-10-CM) - Tenosynovitis of finger ? ?THERAPY DIAG:  ?Other lack of coordination ? ?Pain in right hand ? ?Muscle weakness (generalized) ? ?Stiffness of right hand, not elsewhere classified ? ? ?PERTINENT HISTORY: Pt has has injures to right hand and past DM II with neuropathy which may inhibit healing / risk for more injury ? ?PRECAUTIONS: 6 weeks since I & D sx, no strong/repetitive grip and pinch advised for 3-4 months, monitor for flexion contractures.  ? ?SUBJECTIVE:  ?He states trying to do exercises and having mild pain in wrist, no apparent redness or orthotic rubbing, etc.  He also states a small sliver of wood came out of a little blemish in palm of his hand, when his daughter squeezed it. He has new scratches/open skin in between fingers. When asked about it, he states from scratching off dead skin. OT advises him to use wet rag to remove dead skin, not scratching as he could get infected again from creating new open areas.  He has knee injection scheduled for tomorrow.  ? ? ?PAIN:  ?Are you having pain?  Yes, but still mainly in knee, but some in wrist now too ?Rating: 2/10 in right wrist now  ? ? ?OBJECTIVE: (All objective assessments below are from initial evaluation on: 02/25/22 unless otherwise specified.)  ? ?HAND DOMINANCE: Right ?  ?ADLs: ?Overall ADLs: Unable to use right hand to grab, hold items, open containers now, etc. ?  ?  ?FUNCTIONAL OUTCOME MEASURES: ?Quick Dash: 59% impairment today - 03/12/22 ?  ?UE ROM   02/25/22: all fingers and thumb very swollen, stiff, MF taken  measures symbolic of all fingers today. Thumb measures also taken.  ?  ?Active ROM Right ?02/25/2022 Right ?03/05/22 Right  ?03/24/22  ?Wrist flexion 47* 41* 38*  ?Wrist extension (-15*) 13* now 26*  ?Wrist ulnar deviation      ?Wrist radial deviation      ?Wrist pronation 85* 85*   ?Wrist supination (-5*) 50* 55*  ?(Blank rows = not tested) ?  ?Active ROM Right ?02/25/2022 Right  ?03/24/22  ?Thumb MCP (0-60) (-18*) - 25* (-14) - 30  ?Thumb IP (0-80) 0 - 0 (-3) - 7   ?Thumb Palmar abd/add (0-45) 32 - 40*   ?Thumb Opposition to Index Finger 1.5cm gap today   ?Long MCP (0-90) (-17) - 30  (-2) -  26  ?Long PIP (0-100) (-49) - 52 (-42) -54  ?Long DIP (0-70) (-28) - 36 (-11) - 24  ?(Blank rows = not tested) ?  ?  ?UE MMT:   NT at eval in thumb, but wrist 2-/5 MMT unable to fully extend in gravity eliminated ?  ?  ?HAND FUNCTION: ?Grip strength: Right: TBD lbs; Left: TBD lbs ?  ?COORDINATION: ?TBD for Box & Blocks testing as well as 9HPT when able. Observed impairment in coordination in right hand and even wrist drop and poor finger use as well as thumb.  ? ?03/17/22: Box & Bocks: 17 blocks today  ?  ?SENSATION: ?Impaired in volar thumb now, states he can feel LT in rt hand other fingers volarly though. He does have old wounds on knuckles from injuries he states nt feeling/not realizing (is DM II with neuropathy)  ?  ?EDEMA: 8.5cm circumferential thumb P1 (6.9 in Left hand), 8.6cm circumferential IF P1 (7.4cm in Left hand), whole hand very swollen  ? ?03/05/22: 8.5cm circumferential thumb P1, 8.6cm circumferential IF P1 today as well  ? ?03/24/22: 8.2cm circumferential thumb P1, 8.4cm circumferential IF P1 today as well  ? ?  ?COGNITION: ?Overall cognitive status: Within functional limits for tasks assessed and some safety awareness issues ?  ?  ?OBSERVATIONS: 03/10/22: Pad of thumb volar area covered in black eschar now starting to look more moist, healing up well, some areas peeling off with signs of healthy new skin underneath.  ?  ?   ?TODAY'S TREATMENT:  ?03/24/22: He performs AROM for new measures, swelling is down slightly now, motion is somewhat up. OT reviews importance of fnl activities at home and gives examples. OT also performs stron

## 2022-03-25 DIAGNOSIS — M25562 Pain in left knee: Secondary | ICD-10-CM | POA: Diagnosis not present

## 2022-03-25 DIAGNOSIS — M1712 Unilateral primary osteoarthritis, left knee: Secondary | ICD-10-CM | POA: Diagnosis not present

## 2022-03-26 ENCOUNTER — Ambulatory Visit: Payer: Medicare PPO | Admitting: Rehabilitative and Restorative Service Providers"

## 2022-03-26 ENCOUNTER — Other Ambulatory Visit: Payer: Self-pay

## 2022-03-26 ENCOUNTER — Encounter: Payer: Self-pay | Admitting: Rehabilitative and Restorative Service Providers"

## 2022-03-26 DIAGNOSIS — R278 Other lack of coordination: Secondary | ICD-10-CM | POA: Diagnosis not present

## 2022-03-26 DIAGNOSIS — M25641 Stiffness of right hand, not elsewhere classified: Secondary | ICD-10-CM | POA: Diagnosis not present

## 2022-03-26 DIAGNOSIS — M6281 Muscle weakness (generalized): Secondary | ICD-10-CM

## 2022-03-26 DIAGNOSIS — M79641 Pain in right hand: Secondary | ICD-10-CM | POA: Diagnosis not present

## 2022-03-26 NOTE — Therapy (Signed)
?OUTPATIENT OCCUPATIONAL THERAPY TREATMENT NOTE ? ? ?Patient Name: Erik Hancock ?MRN: [id number] ?DOB:May 26, 1951, 71 y.o., male ?Today's Date: 03/26/2022 ? ?PCP: [name], MD ?REFERRING PROVIDER: [name],* ? OT End of Session - 03/26/22 0935   ? ? Visit Number 9   ? Number of Visits 12   ? Date for OT Re-Evaluation 04/10/22   ? Authorization Type [employer]   ? Progress Note Due on Visit 10   ? OT Start Time [id number]   ? OT Stop Time 1018   ? OT Time Calculation (min) 43 min   ? Activity Tolerance Patient tolerated treatment well;No increased pain   ? Behavior During Therapy [facility] for tasks assessed/performed   ? ?  ?  ? ?  ? ? ? ? ? ? ? ? ? ?Past Medical History:  ?Diagnosis Date  ? Arthritis   ? "all over" (07/29/2018)  ? CAD S/P percutaneous coronary angioplasty   ? a. stent to mLAD 11/2001. b. DES to PDA/mLCx 2007. c. DES to ostial LAD 07/2018.  ? Chronic lower back pain   ? CKD (chronic kidney disease), stage III ([place])   ? CKD (chronic kidney disease), stage III ([place])   ? Diabetic retinopathy ([place])   ? Diabetic retinopathy ([place])   ? GERD (gastroesophageal reflux disease)   ? High cholesterol   ? History of kidney stones   ? Hypertension   ? Mobitz type 2 second degree atrioventricular block   ? a. noted during 07/2018 adm, metoprolol discontinued.  ? Morbid obesity ([place]) 07/28/2018  ? Prostatitis   ? Pulmonary nodule 2013  ? CT  ? Sleep apnea   ? stopbang=5  ? Suspected sleep apnea   ? Type II diabetes mellitus ([place])   ? ?Past Surgical History:  ?Procedure Laterality Date  ? CARDIAC CATHETERIZATION  JUNE 2003  ? PATENT LAD STENT/ BODERLINE OBSTRUCTIVE DISEASE POSTERIOR DESCENDING ARTERY/ NORMAL LVF  ? CATARACT EXTRACTION W/ INTRAOCULAR LENS  IMPLANT, BILATERAL Bilateral 2017  ? CERVICAL SCOVILLE FORAMINOTOMY W/ EXCISION OF HERNIATED [place] PULPOSUS  2009  ? C6 - 7  ? CORONARY ANGIOPLASTY WITH STENT PLACEMENT  03/30/2006  ? DR EDMUNDS - 90% LCx@OM2  TAXUS  DES 3.0 X 20 -> 3.25 mm,   95% RPDA --  TAXUS DES 3.0 X 16 --> 3.5 mm  ? CORONARY ANGIOPLASTY WITH STENT PLACEMENT  11/2001  ? (Dr. Ilda Foil for Dr. Radford Pax) - mLAD@D2  - TAXUS EXPRESS DES 3.5 x 15   ? CORONARY ANGIOPLASTY WITH STENT PLACEMENT  07/29/2018  ? CORONARY STENT INTERVENTION N/A 07/29/2018  ? Procedure: CORONARY STENT INTERVENTION;  Surgeon: Leonie Man, MD;  Location: [place] [facility];  Service: Cardiovascular;  Laterality: N/A;  ? CYSTOSCOPY W/ RETROGRADES  05/04/2012  ? Procedure: CYSTOSCOPY WITH RETROGRADE PYELOGRAM;  Surgeon: Molli Hazard, MD;  [facility];  Service: Urology;  Laterality: Left;  ? CYSTOSCOPY W/ URETERAL STENT PLACEMENT  05/04/2012  ? Procedure: CYSTOSCOPY WITH STENT REPLACEMENT;  Surgeon: Molli Hazard, MD;  [facility];  Service: Urology;  Laterality: Left;  ? CYSTOSCOPY WITH STENT PLACEMENT Left 04/04/2012  ? CYSTOSCOPY WITH URETEROSCOPY  05/04/2012  ? Procedure: CYSTOSCOPY WITH URETEROSCOPY;  Surgeon: Molli Hazard, MD;  [facility];  Service: Urology;  Laterality: Left;   ? ?  ? I & D EXTREMITY Right 02/11/2022  ? Procedure: IRRIGATION AND DEBRIDEMENT  OF HAND AND FOREARM;  Surgeon: Iran Planas, MD;  Location: [place];  Service:  Orthopedics;  Laterality: Right;  ? LEFT HEART CATH AND CORONARY ANGIOGRAPHY N/A 07/29/2018  ? Procedure: LEFT HEART CATH AND CORONARY ANGIOGRAPHY;  Surgeon: Leonie Man, MD;  Location: [place] [facility];  Service: Cardiovascular;  Laterality: N/A;  ? LEFT URETEROSCOPIC STONE EXTRACTION  03-14-2003  ? X2  ? ?Patient Active Problem List  ? Diagnosis Date Noted  ? OSA (obstructive sleep apnea) 02/12/2022  ? Stage 3a chronic kidney disease (CKD) ([place]) 02/11/2022  ? Tenosynovitis of finger 02/11/2022  ? Metabolic acidosis, increased anion gap 02/11/2022  ? Sepsis due to group A Streptococcus ([place]) 02/11/2022  ? First degree heart block 08/11/2018  ? Mobitz type 2 second degree atrioventricular block 07/30/2018  ? Angina, class II ([place]) 07/29/2018  ? Morbid  obesity ([place]) 07/28/2018  ? Exertional dyspnea 07/28/2018  ? Apnea 07/28/2018  ? Diabetic retinopathy ([place]) 12/24/2016  ? Controlled type 2 diabetes mellitus with hyperglycemia, without long-term current use of insulin ([place]) 02/24/2011  ? Essential hypertension 02/24/2011  ? Dyslipidemia, goal LDL below 70 02/24/2011  ? CAD s/p PCI 2019 02/24/2011  ? GERD (gastroesophageal reflux disease) 02/24/2011  ? ? ?ONSET DATE: 02/11/22 DOS ?  ?REFERRING DIAG: M65.9 (ICD-10-CM) - Tenosynovitis of finger ? ?THERAPY DIAG:  ?Other lack of coordination ? ?Pain in right hand ? ?Muscle weakness (generalized) ? ?Stiffness of right hand, not elsewhere classified ? ? ?PERTINENT HISTORY: Pt has has injures to right hand and past DM II with neuropathy which may inhibit healing / risk for more injury ? ?PRECAUTIONS: 6 weeks since I & D sx, no strong/repetitive grip and pinch advised for 3-4 months, monitor for flexion contractures.  ? ?SUBJECTIVE:  ?He states his knee injection was painful, but he is doing well, in w/c was told to go "easy" for 48 hours. He states continuing HEP without issues.  ? ? ?PAIN:  ?Are you having pain? Not at rest today  ?Rating: 0/10 in right wrist now  ? ? ?OBJECTIVE: (All objective assessments below are from initial evaluation on: 02/25/22 unless otherwise specified.)  ? ?HAND DOMINANCE: Right ?  ?ADLs: ?Overall ADLs: Unable to use right hand to grab, hold items, open containers now, etc. ?  ?  ?FUNCTIONAL OUTCOME MEASURES: ?Quick Dash: 59% impairment today - 03/12/22 ?  ?UE ROM   02/25/22: all fingers and thumb very swollen, stiff, MF taken measures symbolic of all fingers today. Thumb measures also taken.  ?  ?Active ROM Right ?02/25/2022 Right ?03/05/22 Right  ?03/24/22  ?Wrist flexion 47* 41* 38*  ?Wrist extension (-15*) 13* now 26*  ?Wrist ulnar deviation      ?Wrist radial deviation      ?Wrist pronation 85* 85*   ?Wrist supination (-5*) 50* 55*  ?(Blank rows = not tested) ?  ?Active ROM Right ?02/25/2022 Right   ?03/24/22  ?Thumb MCP (0-60) (-18*) - 25* (-14) - 30  ?Thumb IP (0-80) 0 - 0 (-3) - 7   ?Thumb Palmar abd/add (0-45) 32 - 40*   ?Thumb Opposition to Index Finger 1.5cm gap today   ?Long MCP (0-90) (-17) - 30  (-2) - 26  ?Long PIP (0-100) (-49) - 52 (-42) -54  ?Long DIP (0-70) (-28) - 36 (-11) - 24  ?(Blank rows = not tested) ?  ?  ?UE MMT:   NT at eval in thumb, but wrist 2-/5 MMT unable to fully extend in gravity eliminated ?  ?  ?HAND FUNCTION: ?Grip strength: Right: TBD lbs; Left: TBD lbs ?  ?  COORDINATION: ?TBD for Box & Blocks testing as well as 9HPT when able. Observed impairment in coordination in right hand and even wrist drop and poor finger use as well as thumb.  ? ?03/17/22: Box & Bocks: 17 blocks today  ?  ?SENSATION: ?Impaired in volar thumb now, states he can feel LT in rt hand other fingers volarly though. He does have old wounds on knuckles from injuries he states nt feeling/not realizing (is DM II with neuropathy)  ?  ?EDEMA: 8.5cm circumferential thumb P1 (6.9 in Left hand), 8.6cm circumferential IF P1 (7.4cm in Left hand), whole hand very swollen  ? ?03/05/22: 8.5cm circumferential thumb P1, 8.6cm circumferential IF P1 today as well  ? ?03/24/22: 8.2cm circumferential thumb P1, 8.4cm circumferential IF P1 today as well  ? ?  ?COGNITION: ?Overall cognitive status: Within functional limits for tasks assessed and some safety awareness issues ?  ?  ?OBSERVATIONS: 03/10/22: Pad of thumb volar area covered in black eschar now starting to look more moist, healing up well, some areas peeling off with signs of healthy new skin underneath.  ?  ?  ?TODAY'S TREATMENT:  ?03/26/22: Due to MCP Js still being very tight, OT fabricates dynamic flexion orthosis to dynamically stretch MCP Js at digits 2-5. This is very time consuming and takes most of the session. It fits well, puts his fingers in "medium" stretch with no pain. He was edu to wear 2-3 x day for 15 minute periods (or as tolerated) and to take off if painful.  Also to wear extension orthotic to help "balance" between flexion and extension. He is also quickly reminded to continue HEP (especially AROM to balance out the PROM orthotics) and FNL activities as much as t

## 2022-03-30 ENCOUNTER — Ambulatory Visit: Payer: Medicare PPO | Admitting: Endocrinology

## 2022-03-30 VITALS — BP 124/66 | HR 71 | Ht 67.0 in | Wt 256.2 lb

## 2022-03-30 DIAGNOSIS — N1831 Chronic kidney disease, stage 3a: Secondary | ICD-10-CM

## 2022-03-30 DIAGNOSIS — Z794 Long term (current) use of insulin: Secondary | ICD-10-CM | POA: Diagnosis not present

## 2022-03-30 DIAGNOSIS — E1165 Type 2 diabetes mellitus with hyperglycemia: Secondary | ICD-10-CM | POA: Diagnosis not present

## 2022-03-30 DIAGNOSIS — E1122 Type 2 diabetes mellitus with diabetic chronic kidney disease: Secondary | ICD-10-CM

## 2022-03-30 LAB — POCT GLYCOSYLATED HEMOGLOBIN (HGB A1C): Hemoglobin A1C: 7.3 % — AB (ref 4.0–5.6)

## 2022-03-30 NOTE — Patient Instructions (Addendum)
check your blood sugar twice a day.  vary the time of day when you check, between before the 3 meals, and at bedtime.  also check if you have symptoms of your blood sugar being too high or too low.  please keep a record of the readings and bring it to your next appointment here (or you can bring the meter itself).  You can write it on any piece of paper.  please call us sooner if your blood sugar goes below 70, or if you have a lot of readings over 200.   ?A different type of diabetes blood test is requested for you again today.  We'll let you know about the results.  Based on the results, I hope to be able to clear you for the knee surgery.   ?We can consider resumption of the [place] later.  ?Please continue the same insulin and Victoza.   ?You should have an endocrinology follow-up appointment in 3 months.   ? ?

## 2022-03-30 NOTE — Progress Notes (Signed)
? ?Subjective:  ? ? Patient ID: Erik Hancock, male    DOB: 12-02-1950, 71 y.o.   MRN: [id number] ? ?HPI ?Pt returns for f/u of diabetes mellitus:  ?DM type: Insulin-requiring type 2 ?Dx'ed: 2002 ?Complications: PN, DR, CAD, and CRI ?Therapy: insulin since 2018, Victoza, and Jardiance.   ?DKA: never ?Severe hypoglycemia: never ?Pancreatitis: never ?Pancreatic imaging: normal on 2017 CT.  ?Other: he takes multiple daily injections.   ?Interval history: he seldom checks cbg.  pt says he never misses meds.   ?Past Medical History:  ?Diagnosis Date  ? Arthritis   ? "all over" (07/29/2018)  ? CAD S/P percutaneous coronary angioplasty   ? a. stent to mLAD 11/2001. b. DES to PDA/mLCx 2007. c. DES to ostial LAD 07/2018.  ? Chronic lower back pain   ? CKD (chronic kidney disease), stage III ([place])   ? CKD (chronic kidney disease), stage III ([place])   ? Diabetic retinopathy ([place])   ? Diabetic retinopathy ([place])   ? GERD (gastroesophageal reflux disease)   ? High cholesterol   ? History of kidney stones   ? Hypertension   ? Mobitz type 2 second degree atrioventricular block   ? a. noted during 07/2018 adm, metoprolol discontinued.  ? Morbid obesity ([place]) 07/28/2018  ? Prostatitis   ? Pulmonary nodule 2013  ? CT  ? Sleep apnea   ? stopbang=5  ? Suspected sleep apnea   ? Type II diabetes mellitus ([place])   ? ? ?Past Surgical History:  ?Procedure Laterality Date  ? CARDIAC CATHETERIZATION  JUNE 2003  ? PATENT LAD STENT/ BODERLINE OBSTRUCTIVE DISEASE POSTERIOR DESCENDING ARTERY/ NORMAL LVF  ? CATARACT EXTRACTION W/ INTRAOCULAR LENS  IMPLANT, BILATERAL Bilateral 2017  ? CERVICAL SCOVILLE FORAMINOTOMY W/ EXCISION OF HERNIATED [place] PULPOSUS  2009  ? C6 - 7  ? CORONARY ANGIOPLASTY WITH STENT PLACEMENT  03/30/2006  ? DR EDMUNDS - 90% LCx@OM2  TAXUS  DES 3.0 X 20 -> 3.25 mm,   95% RPDA -- TAXUS DES 3.0 X 16 --> 3.5 mm  ? CORONARY ANGIOPLASTY WITH STENT PLACEMENT  11/2001  ? (Dr. Ilda Foil for Dr. Radford Pax) - mLAD@D2  - TAXUS EXPRESS DES 3.5 x 15   ?  CORONARY ANGIOPLASTY WITH STENT PLACEMENT  07/29/2018  ? CORONARY STENT INTERVENTION N/A 07/29/2018  ? Procedure: CORONARY STENT INTERVENTION;  Surgeon: Leonie Man, MD;  Location: [facility];  Service: Cardiovascular;  Laterality: N/A;  ? CYSTOSCOPY W/ RETROGRADES  05/04/2012  ? Procedure: CYSTOSCOPY WITH RETROGRADE PYELOGRAM;  Surgeon: Molli Hazard, MD;  [facility];  Service: Urology;  Laterality: Left;  ? CYSTOSCOPY W/ URETERAL STENT PLACEMENT  05/04/2012  ? Procedure: CYSTOSCOPY WITH STENT REPLACEMENT;  Surgeon: Molli Hazard, MD;  [facility];  Service: Urology;  Laterality: Left;  ? CYSTOSCOPY WITH STENT PLACEMENT Left 04/04/2012  ? CYSTOSCOPY WITH URETEROSCOPY  05/04/2012  ? Procedure: CYSTOSCOPY WITH URETEROSCOPY;  Surgeon: Molli Hazard, MD;  [facility];  Service: Urology;  Laterality: Left;   ? ?  ? I & D EXTREMITY Right 02/11/2022  ? Procedure: IRRIGATION AND DEBRIDEMENT  OF HAND AND FOREARM;  Surgeon: Iran Planas, MD;  Location: [facility];  Service: Orthopedics;  Laterality: Right;  ? LEFT HEART CATH AND CORONARY ANGIOGRAPHY N/A 07/29/2018  ? Procedure: LEFT HEART CATH AND CORONARY ANGIOGRAPHY;  Surgeon: Leonie Man, MD;  Location: [facility];  Service: Cardiovascular;  Laterality: N/A;  ? LEFT URETEROSCOPIC STONE EXTRACTION  03-14-2003  ?  X2  ? ? ?Social History  ? ?Socioeconomic History  ? Marital status: Married  ?  Spouse name: Not on file  ? Number of children: Not on file  ? Years of education: Not on file  ? Highest education level: Not on file  ?Occupational History  ? Not on file  ?Tobacco Use  ? Smoking status: Never  ? Smokeless tobacco: Never  ?Vaping Use  ? Vaping Use: Never used  ?Substance and Sexual Activity  ? Alcohol use: Never  ? Drug use: Never  ? Sexual activity: Not Currently  ?Other Topics Concern  ? Not on file  ?Social History Narrative  ? Right handed   ? Lives with wife   ? ?Social Determinants of Health  ? ?Financial  Resource Strain: Not on file  ?Food Insecurity: Not on file  ?Transportation Needs: Not on file  ?Physical Activity: Not on file  ?Stress: Not on file  ?Social Connections: Not on file  ?Intimate Partner Violence: Not on file  ? ? ?Current Outpatient Medications on File Prior to Visit  ?Medication Sig Dispense Refill  ? ACCU-CHEK AVIVA PLUS test strip USE STRIP TO CHECK GLUCOSE TWICE DAILY TO THREE TIMES DAILY **NEED OFFICE VISIT FOR REFILLS** 100 each 0  ? aspirin 81 MG tablet Take 81 mg by mouth daily.    ? ciprofloxacin (CILOXAN) 0.3 % ophthalmic solution Place 1 drop into both eyes See admin instructions. 1 DROP INTO EACH EYE 4 TIMES DAILY FOR 2 DAYS AFTER EACH MONTHLY INJECTION    ? Continuous Blood Gluc Sensor (FREESTYLE LIBRE 2 SENSOR) MISC 1 Device by Does not apply route every 14 (fourteen) days. 6 each 3  ? Insulin Aspart FlexPen 100 UNIT/ML SOPN Inject 30 Units into the skin 2 (two) times daily with a meal. 60 mL 3  ? insulin glargine (LANTUS SOLOSTAR) 100 UNIT/ML Solostar Pen Inject 24 Units into the skin at bedtime. 30 mL 2  ? Insulin Pen Needle (BD ULTRA-FINE PEN NEEDLES) 29G X 12.7MM MISC 1 Device by Other route in the morning, at noon, in the evening, and at bedtime. 360 each 3  ? liraglutide (VICTOZA) 18 MG/3ML SOPN Inject 1.8 mg into the skin daily. (Patient taking differently: Inject 1.8 mg into the skin in the morning.) 18 mL 3  ? nitroGLYCERIN (NITROLINGUAL) 0.4 MG/SPRAY spray Place 1 spray under the tongue every 5 (five) minutes x 3 doses as needed for chest pain. 12 g 12  ? pantoprazole (PROTONIX) 40 MG tablet Take 1 tablet (40 mg total) by mouth daily. 90 tablet 3  ? ticagrelor (BRILINTA) 60 MG TABS tablet Take 60 mg by mouth 2 (two) times daily.    ? valsartan (DIOVAN) 160 MG tablet Take 1 tablet (160 mg total) by mouth daily. 90 tablet 1  ? ?No current facility-administered medications on file prior to visit.  ? ? ?Allergies  ?Allergen Reactions  ? Beta Adrenergic Blockers   ?  Metoprolol  stopped 07/2018 due to 2nd degree type 2 AV block.  ? ? ?Family History  ?Problem Relation Age of Onset  ? Hypertension Mother   ? Heart attack Father   ? Heart disease Father   ? Alzheimer's disease Father   ? Diabetes Father   ? Heart attack Paternal Grandfather   ? Heart disease Paternal Grandfather   ? Lupus Sister   ? ? ?BP 124/66 (BP Location: Left Arm, Patient Position: Sitting, Cuff Size: Normal)   Pulse 71   Ht 5\' 7"  (  1.702 m)   Wt 256 lb 3.2 oz (116.2 kg)   SpO2 95%   BMI 40.13 kg/m?  ? ? ?Review of Systems ?He denies hypoglycemia ?   ?Objective:  ? Physical Exam ?VITAL SIGNS:  See vs page. ?GENERAL: no distress. ? ? ?Lab Results  ?Component Value Date  ? HGBA1C 7.3 (A) 03/30/2022  ? ?Lab Results  ?Component Value Date  ? CREATININE 1.05 03/04/2022  ? BUN 11 03/04/2022  ? NA 141 03/04/2022  ? K 3.8 03/04/2022  ? CL 104 03/04/2022  ? CO2 25 03/04/2022  ? ?   ?Assessment & Plan:  ?Insulin-requiring type 2 DM: apparently well-controlled ?= ? ?Patient Instructions  ?check your blood sugar twice a day.  vary the time of day when you check, between before the 3 meals, and at bedtime.  also check if you have symptoms of your blood sugar being too high or too low.  please keep a record of the readings and bring it to your next appointment here (or you can bring the meter itself).  You can write it on any piece of paper.  please call us sooner if your blood sugar goes below 70, or if you have a lot of readings over 200.   ?A different type of diabetes blood test is requested for you again today.  We'll let you know about the results.  Based on the results, I hope to be able to clear you for the knee surgery.   ?We can consider resumption of the Jardiance later.  ?Please continue the same insulin and Victoza.   ?You should have an endocrinology follow-up appointment in 3 months.   ? ? ? ?

## 2022-03-31 ENCOUNTER — Encounter (INDEPENDENT_AMBULATORY_CARE_PROVIDER_SITE_OTHER): Payer: Medicare PPO | Admitting: Ophthalmology

## 2022-04-01 ENCOUNTER — Ambulatory Visit: Payer: Medicare PPO | Admitting: Rehabilitative and Restorative Service Providers"

## 2022-04-01 ENCOUNTER — Encounter: Payer: Self-pay | Admitting: Rehabilitative and Restorative Service Providers"

## 2022-04-01 DIAGNOSIS — R278 Other lack of coordination: Secondary | ICD-10-CM

## 2022-04-01 DIAGNOSIS — M25562 Pain in left knee: Secondary | ICD-10-CM | POA: Diagnosis not present

## 2022-04-01 DIAGNOSIS — M6281 Muscle weakness (generalized): Secondary | ICD-10-CM | POA: Diagnosis not present

## 2022-04-01 DIAGNOSIS — M79641 Pain in right hand: Secondary | ICD-10-CM | POA: Diagnosis not present

## 2022-04-01 DIAGNOSIS — M25641 Stiffness of right hand, not elsewhere classified: Secondary | ICD-10-CM | POA: Diagnosis not present

## 2022-04-01 DIAGNOSIS — M1712 Unilateral primary osteoarthritis, left knee: Secondary | ICD-10-CM | POA: Diagnosis not present

## 2022-04-01 NOTE — Therapy (Signed)
?OUTPATIENT OCCUPATIONAL THERAPY TREATMENT & PROGRESS NOTE ? ? ?Patient Name: [name] ?MRN: [id number] ?DOB:20-May-1951, 71 y.o., male ?Today's Date: 04/01/2022 ? ?PCP: [name], MD ?REFERRING PROVIDER: [name],* ? ?Progress Note ?Reporting Period 02/25/22 to 04/01/22 ? ?See note below for Objective Data and Assessment of Progress/Goals.  ? ? OT End of Session - 04/01/22 1106   ? ? Visit Number 10   ? Number of Visits 12   ? Date for OT Re-Evaluation 04/10/22   ? Authorization Type [employer]   ? Progress Note Due on Visit 10   ? OT Start Time 1106   ? OT Stop Time 1145   ? OT Time Calculation (min) 39 min   ? Activity Tolerance Patient tolerated treatment well;No increased pain   ? Behavior During Therapy [facility] for tasks assessed/performed   ? ?  ?  ? ?  ? ? ? ?Past Medical History:  ?Diagnosis Date  ? Arthritis   ? "all over" (07/29/2018)  ? CAD S/P percutaneous coronary angioplasty   ? a. stent to mLAD 11/2001. b. DES to PDA/mLCx 2007. c. DES to ostial LAD 07/2018.  ? Chronic lower back pain   ? CKD (chronic kidney disease), stage III ([place])   ? CKD (chronic kidney disease), stage III ([place])   ? Diabetic retinopathy ([place])   ? Diabetic retinopathy ([place])   ? GERD (gastroesophageal reflux disease)   ? High cholesterol   ? History of kidney stones   ? Hypertension   ? Mobitz type 2 second degree atrioventricular block   ? a. noted during 07/2018 adm, metoprolol discontinued.  ? Morbid obesity ([place]) 07/28/2018  ? Prostatitis   ? Pulmonary nodule 2013  ? CT  ? Sleep apnea   ? stopbang=5  ? Suspected sleep apnea   ? Type II diabetes mellitus ([place])   ? ?Past Surgical History:  ?Procedure Laterality Date  ? CARDIAC CATHETERIZATION  JUNE 2003  ? PATENT LAD STENT/ BODERLINE OBSTRUCTIVE DISEASE POSTERIOR DESCENDING ARTERY/ NORMAL LVF  ? CATARACT EXTRACTION W/ INTRAOCULAR LENS  IMPLANT, BILATERAL Bilateral 2017  ? CERVICAL SCOVILLE FORAMINOTOMY W/ EXCISION OF HERNIATED [place] PULPOSUS  2009  ? C6 - 7  ?  CORONARY ANGIOPLASTY WITH STENT PLACEMENT  03/30/2006  ? DR EDMUNDS - 90% LCx@OM2  TAXUS  DES 3.0 X 20 -> 3.25 mm,   95% RPDA -- TAXUS DES 3.0 X 16 --> 3.5 mm  ? CORONARY ANGIOPLASTY WITH STENT PLACEMENT  11/2001  ? (Dr. Ilda Foil for Dr. Radford Pax) - mLAD@D2  - TAXUS EXPRESS DES 3.5 x 15   ? CORONARY ANGIOPLASTY WITH STENT PLACEMENT  07/29/2018  ? CORONARY STENT INTERVENTION N/A 07/29/2018  ? Procedure: CORONARY STENT INTERVENTION;  Surgeon: Leonie Man, MD;  Location: [facility];  Service: Cardiovascular;  Laterality: N/A;  ? CYSTOSCOPY W/ RETROGRADES  05/04/2012  ? Procedure: CYSTOSCOPY WITH RETROGRADE PYELOGRAM;  Surgeon: Molli Hazard, MD;  [facility];  Service: Urology;  Laterality: Left;  ? CYSTOSCOPY W/ URETERAL STENT PLACEMENT  05/04/2012  ? Procedure: CYSTOSCOPY WITH STENT REPLACEMENT;  Surgeon: Molli Hazard, MD;  [facility];  Service: Urology;  Laterality: Left;  ? CYSTOSCOPY WITH STENT PLACEMENT Left 04/04/2012  ? CYSTOSCOPY WITH URETEROSCOPY  05/04/2012  ? Procedure: CYSTOSCOPY WITH URETEROSCOPY;  Surgeon: Molli Hazard, MD;  [facility];  Service: Urology;  Laterality: Left;   ? ?  ? I & D EXTREMITY Right 02/11/2022  ? Procedure: IRRIGATION AND  DEBRIDEMENT  OF HAND AND FOREARM;  Surgeon: Iran Planas, MD;  Location: [facility];  Service: Orthopedics;  Laterality: Right;  ? LEFT HEART CATH AND CORONARY ANGIOGRAPHY N/A 07/29/2018  ? Procedure: LEFT HEART CATH AND CORONARY ANGIOGRAPHY;  Surgeon: Leonie Man, MD;  Location: [facility];  Service: Cardiovascular;  Laterality: N/A;  ? LEFT URETEROSCOPIC STONE EXTRACTION  03-14-2003  ? X2  ? ?Patient Active Problem List  ? Diagnosis Date Noted  ? OSA (obstructive sleep apnea) 02/12/2022  ? Stage 3a chronic kidney disease (CKD) ([place]) 02/11/2022  ? Tenosynovitis of finger 02/11/2022  ? Metabolic acidosis, increased anion gap 02/11/2022  ? Sepsis due to group A Streptococcus ([place]) 02/11/2022  ? First degree heart  block 08/11/2018  ? Mobitz type 2 second degree atrioventricular block 07/30/2018  ? Angina, class II ([place]) 07/29/2018  ? Morbid obesity ([place]) 07/28/2018  ? Exertional dyspnea 07/28/2018  ? Apnea 07/28/2018  ? Diabetic retinopathy ([place]) 12/24/2016  ? Controlled type 2 diabetes mellitus with hyperglycemia, without long-term current use of insulin ([place]) 02/24/2011  ? Essential hypertension 02/24/2011  ? Dyslipidemia, goal LDL below 70 02/24/2011  ? CAD s/p PCI 2019 02/24/2011  ? GERD (gastroesophageal reflux disease) 02/24/2011  ? ? ?ONSET DATE: 02/11/22 DOS ?  ?REFERRING DIAG: M65.9 (ICD-10-CM) - Tenosynovitis of finger ? ?THERAPY DIAG:  ?Other lack of coordination ? ?Pain in right hand ? ?Muscle weakness (generalized) ? ?Stiffness of right hand, not elsewhere classified ? ? ?PERTINENT HISTORY: Pt has has injures to right hand and past DM II with neuropathy which may inhibit healing / risk for more injury ? ?PRECAUTIONS: 7 weeks since I & D sx, no strong/repetitive grip and pinch advised for 3-4 months, monitor for flexion contractures.  ? ?SUBJECTIVE:  ?He states wearing dynamic orthotic a few times a day and it is stretching somewhat. Doesn't have it today, asked to bring next time.  His wife states that he has been not using hand much, sitting in lift chair with hand stiff at his chest.  He is cautioned strongly against this.  ? ? ?PAIN:  ?Are you having pain? Yes  ?Rating: 2/10 in right wrist now  ? ? ?OBJECTIVE: (All objective assessments below are from initial evaluation on: 02/25/22 unless otherwise specified.)  ? ?HAND DOMINANCE: Right ?  ?ADLs: ?Overall ADLs: Unable to use right hand to grab, hold items, open containers now, etc. ?  ?  ?FUNCTIONAL OUTCOME MEASURES: ?Quick Dash: 59% impairment today - 03/12/22 ?  ?UE ROM   02/25/22: all fingers and thumb very swollen, stiff, MF taken measures symbolic of all fingers today. Thumb measures also taken.  ?  ?Active ROM Right ?02/25/2022 Right ?03/05/22 Right  ?03/24/22  Right ?04/01/22  ?Wrist flexion 47* 41* 38* 38*  ?Wrist extension (-15*) 13* now 26* 33*  ?Wrist ulnar deviation       ?Wrist radial deviation       ?Wrist pronation 85* 85*    ?Wrist supination (-5*) 50* 55*   ?(Blank rows = not tested) ?  ?Active ROM Right ?02/25/2022 Right  ?03/24/22  ?Thumb MCP (0-60) (-18*) - 25* (-14) - 30  ?Thumb IP (0-80) 0 - 0 (-3) - 7   ?Thumb Palmar abd/add (0-45) 32 - 40*   ?Thumb Opposition to Index Finger 1.5cm gap today   ?Long MCP (0-90) (-17) - 30  (-2) - 26  ?Long PIP (0-100) (-49) - 52 (-42) -54  ?Long DIP (0-70) (-28) - 36 (-11) -  24  ?(Blank rows = not tested) ?  ?  ?UE MMT:   NT at eval in thumb, but wrist 2-/5 MMT unable to fully extend in gravity eliminated ?  ?  ?HAND FUNCTION: ?Grip strength: Right: TBD lbs; Left: TBD lbs ?  ?COORDINATION: ?TBD for Box & Blocks testing as well as 9HPT when able. Observed impairment in coordination in right hand and even wrist drop and poor finger use as well as thumb.  ? ?03/17/22: Box & Bocks: 17 blocks today  ?  ?SENSATION: ?Impaired in volar thumb now, states he can feel LT in rt hand other fingers volarly though. He does have old wounds on knuckles from injuries he states nt feeling/not realizing (is DM II with neuropathy)  ?  ?EDEMA: 8.5cm circumferential thumb P1 (6.9 in Left hand), 8.6cm circumferential IF P1 (7.4cm in Left hand), whole hand very swollen  ? ?03/05/22: 8.5cm circumferential thumb P1, 8.6cm circumferential IF P1 today as well  ? ?03/24/22: 8.2cm circumferential thumb P1, 8.4cm circumferential IF P1 today as well  ? ? ?  ?COGNITION: ?Overall cognitive status: Within functional limits for tasks assessed and some safety awareness issues ?  ?  ?OBSERVATIONS:  ?04/01/22: black eschar on thumb peeling up a bit revealing yellow wound bed in areas. Eschar feels a bit "loose" today over wound and he is cautioned not to pick at it or put too much shearing force on wound.  ? ?03/10/22: Pad of thumb volar area covered in black eschar now  starting to look more moist, healing up well, some areas peeling off with signs of healthy new skin underneath.  ?  ?  ?TODAY'S TREATMENT:  ?04/01/22: OT continues education/encouraging motion through wrist and f

## 2022-04-03 LAB — FRUCTOSAMINE: Fructosamine: 289 umol/L — ABNORMAL HIGH (ref 205–285)

## 2022-04-07 ENCOUNTER — Ambulatory Visit: Payer: Medicare PPO | Admitting: Rehabilitative and Restorative Service Providers"

## 2022-04-07 ENCOUNTER — Encounter: Payer: Self-pay | Admitting: Rehabilitative and Restorative Service Providers"

## 2022-04-07 DIAGNOSIS — R278 Other lack of coordination: Secondary | ICD-10-CM | POA: Diagnosis not present

## 2022-04-07 DIAGNOSIS — M25641 Stiffness of right hand, not elsewhere classified: Secondary | ICD-10-CM

## 2022-04-07 DIAGNOSIS — M79641 Pain in right hand: Secondary | ICD-10-CM

## 2022-04-07 DIAGNOSIS — M6281 Muscle weakness (generalized): Secondary | ICD-10-CM

## 2022-04-07 NOTE — Therapy (Signed)
?OUTPATIENT OCCUPATIONAL THERAPY TREATMENT & PROGRESS NOTE ? ? ?Patient Name: Erik Hancock ?MRN: [id number] ?DOB:10/14/1951, 71 y.o., male ?Today's Date: 04/07/2022 ? ?PCP: [name], MD ?REFERRING PROVIDER: [name],* ?Surgeon: Iran Planas, MD  ? ?Progress Note ? ?Reporting Period 02/25/22 to 04/07/22 ? ?See note below for Objective Data and Assessment of Progress/Goals.  ? ?  ? OT End of Session - 04/07/22 1024   ? ? Visit Number 11   ? Number of Visits 20   ? Date for OT Re-Evaluation 06/05/22   ? Authorization Type [employer]   ? Progress Note Due on Visit 20   ? OT Start Time 1017   ? OT Stop Time 1107   ? OT Time Calculation (min) 50 min   ? Equipment Utilized During Treatment orthotic materials   ? Activity Tolerance Patient tolerated treatment well;No increased pain;Patient limited by pain   ? Behavior During Therapy [facility] for tasks assessed/performed   ? ?  ?  ? ?  ? ? ?Past Medical History:  ?Diagnosis Date  ? Arthritis   ? "all over" (07/29/2018)  ? CAD S/P percutaneous coronary angioplasty   ? a. stent to mLAD 11/2001. b. DES to PDA/mLCx 2007. c. DES to ostial LAD 07/2018.  ? Chronic lower back pain   ? CKD (chronic kidney disease), stage III ([place])   ? CKD (chronic kidney disease), stage III ([place])   ? Diabetic retinopathy ([place])   ? Diabetic retinopathy ([place])   ? GERD (gastroesophageal reflux disease)   ? High cholesterol   ? History of kidney stones   ? Hypertension   ? Mobitz type 2 second degree atrioventricular block   ? a. noted during 07/2018 adm, metoprolol discontinued.  ? Morbid obesity ([place]) 07/28/2018  ? Prostatitis   ? Pulmonary nodule 2013  ? CT  ? Sleep apnea   ? stopbang=5  ? Suspected sleep apnea   ? Type II diabetes mellitus ([place])   ? ?Past Surgical History:  ?Procedure Laterality Date  ? CARDIAC CATHETERIZATION  JUNE 2003  ? PATENT LAD STENT/ BODERLINE OBSTRUCTIVE DISEASE POSTERIOR DESCENDING ARTERY/ NORMAL LVF  ? CATARACT EXTRACTION W/ INTRAOCULAR LENS  IMPLANT,  BILATERAL Bilateral 2017  ? CERVICAL SCOVILLE FORAMINOTOMY W/ EXCISION OF HERNIATED [place] PULPOSUS  2009  ? C6 - 7  ? CORONARY ANGIOPLASTY WITH STENT PLACEMENT  03/30/2006  ? DR EDMUNDS - 90% LCx@OM2  TAXUS  DES 3.0 X 20 -> 3.25 mm,   95% RPDA -- TAXUS DES 3.0 X 16 --> 3.5 mm  ? CORONARY ANGIOPLASTY WITH STENT PLACEMENT  11/2001  ? (Dr. Ilda Foil for Dr. Radford Pax) - mLAD@D2  - TAXUS EXPRESS DES 3.5 x 15   ? CORONARY ANGIOPLASTY WITH STENT PLACEMENT  07/29/2018  ? CORONARY STENT INTERVENTION N/A 07/29/2018  ? Procedure: CORONARY STENT INTERVENTION;  Surgeon: Leonie Man, MD;  Location: [facility];  Service: Cardiovascular;  Laterality: N/A;  ? CYSTOSCOPY W/ RETROGRADES  05/04/2012  ? Procedure: CYSTOSCOPY WITH RETROGRADE PYELOGRAM;  Surgeon: Molli Hazard, MD;  [facility];  Service: Urology;  Laterality: Left;  ? CYSTOSCOPY W/ URETERAL STENT PLACEMENT  05/04/2012  ? Procedure: CYSTOSCOPY WITH STENT REPLACEMENT;  Surgeon: Molli Hazard, MD;  [facility];  Service: Urology;  Laterality: Left;  ? CYSTOSCOPY WITH STENT PLACEMENT Left 04/04/2012  ? CYSTOSCOPY WITH URETEROSCOPY  05/04/2012  ? Procedure: CYSTOSCOPY WITH URETEROSCOPY;  Surgeon: Molli Hazard, MD;  [facility];  Service: Urology;  Laterality: Left;   ? ?  ? I & D EXTREMITY Right 02/11/2022  ? Procedure: IRRIGATION AND DEBRIDEMENT  OF HAND AND FOREARM;  Surgeon: Iran Planas, MD;  Location: [facility];  Service: Orthopedics;  Laterality: Right;  ? LEFT HEART CATH AND CORONARY ANGIOGRAPHY N/A 07/29/2018  ? Procedure: LEFT HEART CATH AND CORONARY ANGIOGRAPHY;  Surgeon: Leonie Man, MD;  Location: [facility];  Service: Cardiovascular;  Laterality: N/A;  ? LEFT URETEROSCOPIC STONE EXTRACTION  03-14-2003  ? X2  ? ?Patient Active Problem List  ? Diagnosis Date Noted  ? OSA (obstructive sleep apnea) 02/12/2022  ? Stage 3a chronic kidney disease (CKD) ([place]) 02/11/2022  ? Tenosynovitis of finger 02/11/2022  ? Metabolic  acidosis, increased anion gap 02/11/2022  ? Sepsis due to group A Streptococcus ([place]) 02/11/2022  ? First degree heart block 08/11/2018  ? Mobitz type 2 second degree atrioventricular block 07/30/2018  ? Angina, class II ([place]) 07/29/2018  ? Morbid obesity ([place]) 07/28/2018  ? Exertional dyspnea 07/28/2018  ? Apnea 07/28/2018  ? Diabetic retinopathy ([place]) 12/24/2016  ? Controlled type 2 diabetes mellitus with hyperglycemia, without long-term current use of insulin ([place]) 02/24/2011  ? Essential hypertension 02/24/2011  ? Dyslipidemia, goal LDL below 70 02/24/2011  ? CAD s/p PCI 2019 02/24/2011  ? GERD (gastroesophageal reflux disease) 02/24/2011  ? ? ?ONSET DATE: 02/11/22 DOS ?  ?REFERRING DIAG: M65.9 (ICD-10-CM) - Tenosynovitis of finger ? ?THERAPY DIAG:  ?Other lack of coordination ? ?Pain in right hand ? ?Muscle weakness (generalized) ? ?Stiffness of right hand, not elsewhere classified ? ? ?PERTINENT HISTORY: Pt has has injures to right hand and past DM II with neuropathy which may inhibit healing / risk for more injury ? ?PRECAUTIONS: 8 weeks since I & D sx, no strong/repetitive grip and pinch advised for 3-4 months, monitor for flexion contractures.  ? ?SUBJECTIVE:  ?He states doing HEP, working with putty, wearing braces as needed, etc. He states his knee pain is still the most bothersome thing to him right now.  ? ?PAIN:  ?Are you having pain?  Not in hand  ?Rating: 0/10 in right wrist now  ? ? ?OBJECTIVE: (All objective assessments below are from initial evaluation on: 02/25/22 unless otherwise specified.)  ? ?HAND DOMINANCE: Right ?  ?ADLs: ?Overall ADLs: Eval: Unable to use right hand to grab, hold items, open containers now, etc. ?  ?  ?FUNCTIONAL OUTCOME MEASURES: ?Quick Dash: 59% impairment today - 03/12/22 ? ?04/07/22: Katina Dung: 56.8% impairment today ?  ? ?UE ROM   02/25/22: all fingers and thumb very swollen, stiff, MF taken measures symbolic of all fingers today. Thumb measures also taken.  ? ?04/07/22:  fingers / wrist continue to be very stiff, though swelling is markedly better  ?  ?Active ROM Right ?02/25/2022 Right ?03/05/22 Right  ?03/24/22 Right ?04/01/22 Right ?04/07/22  ?Wrist flexion 47* 41* 38* 38* 38  ?Wrist extension (-15*) 13* now 26* 33* 22  ?Wrist ulnar deviation        ?Wrist radial deviation        ?Wrist pronation 85* 85*   76  ?Wrist supination (-5*) 50* 55*  40  ?(Blank rows = not tested) ?  ?Active ROM Right ?02/25/2022 Right  ?03/24/22 Right ?04/07/22  ?Thumb MCP (0-60) (-18*) - 25* (-14) - 30 (-14) -27  ?Thumb IP (0-80) 0 - 0 (-3) - 7  0- 7  ?Thumb Palmar abd/add (0-45) 32 - 40*  38*   ?Thumb  Opposition to Index Finger 1.5cm gap today  0cm gap to IF now (3cm gap to MF now)   ?Long MCP (0-90) (-17) - 30  (-2) - 26 (-7) -40  ?Long PIP (0-100) (-49) - 52 (-42) -54 (-39) -50  ?Long DIP (0-70) (-28) - 36 (-11) - 24 (-18) - 20  ?(Blank rows = not tested) ?  ?  ?UE MMT:  Eval:  NT at eval in thumb, but wrist 2-/5 MMT unable to fully extend in gravity eliminated ? ?  ?HAND FUNCTION: ?04/07/22: unable Rt hand grip testing due to poor ROM and swelling, pain, etc.  ?  ?COORDINATION: ?Eval: TBD for Box & Blocks testing as well as 9HPT when able. Observed impairment in coordination in right hand and even wrist drop and poor finger use as well as thumb.  ? ?03/17/22: Box & Bocks: 17 blocks today  ? ?  ?SENSATION: ?Eval: Impaired in volar thumb now, states he can feel LT in rt hand other fingers volarly though. He does have old wounds on knuckles from injuries he states not feeling/not realizing (is DM II with neuropathy)  ?  ?EDEMA: Eval: 8.5cm circumferential thumb P1 (6.9 in Left hand), 8.6cm circumferential IF P1 (7.4cm in Left hand), whole hand very swollen  ? ?03/05/22: 8.5cm circumferential thumb P1, 8.6cm circumferential IF P1 today as well  ? ?03/24/22: 8.2cm circumferential thumb P1, 8.4cm circumferential IF P1 today as well  ? ?  ?COGNITION: ?Overall cognitive status: Within functional limits for tasks assessed  and some safety awareness issues ?  ?  ?OBSERVATIONS:  ?04/07/22: Edema looks much better in fingers, wrist and FA now. Wound looks to be well healing still. Very stiff still.  ? ?  ?TODAY'S TREATMENT:  ?04/07/22:

## 2022-04-08 DIAGNOSIS — M25562 Pain in left knee: Secondary | ICD-10-CM | POA: Diagnosis not present

## 2022-05-07 ENCOUNTER — Ambulatory Visit: Payer: Medicare PPO | Admitting: Family Medicine

## 2022-05-07 ENCOUNTER — Ambulatory Visit (HOSPITAL_BASED_OUTPATIENT_CLINIC_OR_DEPARTMENT_OTHER): Payer: Medicare PPO | Admitting: Cardiovascular Disease

## 2022-05-07 VITALS — BP 162/78 | HR 106 | Temp 98.2°F | Ht 67.0 in | Wt 265.0 lb

## 2022-05-07 DIAGNOSIS — L03114 Cellulitis of left upper limb: Secondary | ICD-10-CM

## 2022-05-07 MED ORDER — DOXYCYCLINE HYCLATE 100 MG PO TABS: 100.0000 mg | ORAL_TABLET | Freq: Two times a day (BID) | ORAL | 0 refills | Status: DC

## 2022-05-07 MED ORDER — CEFTRIAXONE SODIUM 1 G IJ SOLR
1.0000 g | Freq: Once | INTRAMUSCULAR | Status: AC
  Administered 2022-05-07: 1 g via INTRAMUSCULAR

## 2022-05-07 MED ORDER — VALSARTAN 320 MG PO TABS: 320.0000 mg | ORAL_TABLET | Freq: Every day | ORAL | 3 refills | Status: DC

## 2022-05-07 NOTE — Progress Notes (Signed)
Subjective:    Patient ID: Erik Hancock, male    DOB: [birth year], 71 y.o.   MRN: [id number]     The skin over the left elbow is erythematous warm and tender.  The patient states that this started yesterday.  He states that the pain feels superficial and tight.  He denies any pain with flexion or extension of his elbow.  There is some mild fluctuance in the olecranon bursa suggesting bursitis.  The concern is whether this is simply cellulitis or if the patient could have septic bursitis. Past Medical History:  Diagnosis Date   Arthritis    "all over" (07/29/2018)   CAD S/P percutaneous coronary angioplasty    a. stent to mLAD 11/2001. b. DES to PDA/mLCx 2007. c. DES to ostial LAD 07/2018.   Chronic lower back pain    CKD (chronic kidney disease), stage III (HCC)    CKD (chronic kidney disease), stage III (HCC)    Diabetic retinopathy (HCC)    Diabetic retinopathy (HCC)    GERD (gastroesophageal reflux disease)    High cholesterol    History of kidney stones    Hypertension    Mobitz type 2 second degree atrioventricular block    a. noted during 07/2018 adm, metoprolol discontinued.   Morbid obesity (HCC) 07/28/2018   Prostatitis    Pulmonary nodule 2013   CT   Sleep apnea    stopbang=5   Suspected sleep apnea    Type II diabetes mellitus (HCC)    Past Surgical History:  Procedure Laterality Date   CARDIAC CATHETERIZATION  JUNE 2003   PATENT LAD STENT/ BODERLINE OBSTRUCTIVE DISEASE POSTERIOR DESCENDING ARTERY/ NORMAL LVF   CATARACT EXTRACTION W/ INTRAOCULAR LENS  IMPLANT, BILATERAL Bilateral 2017   CERVICAL SCOVILLE FORAMINOTOMY W/ EXCISION OF HERNIATED NUCLEC PULPOSUS  2009   C6 - 7   CORONARY ANGIOPLASTY WITH STENT PLACEMENT  03/30/2006   DR EDMUNDS - 90% LCx@OM2  TAXUS  DES 3.0 X 20 -> 3.25 mm,   95% RPDA -- TAXUS DES 3.0 X 16 --> 3.5 mm   CORONARY ANGIOPLASTY WITH STENT PLACEMENT  11/2001   (Dr. Ty Hilts for Dr. Mayford Knife) - mLAD@D2  - TAXUS EXPRESS DES 3.5 x 15    CORONARY  ANGIOPLASTY WITH STENT PLACEMENT  07/29/2018   CORONARY STENT INTERVENTION N/A 07/29/2018   Procedure: CORONARY STENT INTERVENTION;  Surgeon: Marykay Lex, MD;  Location: [facility];  Service: Cardiovascular;  Laterality: N/A;   CYSTOSCOPY W/ RETROGRADES  05/04/2012   Procedure: CYSTOSCOPY WITH RETROGRADE PYELOGRAM;  Surgeon: Milford Cage, MD;  Location: [facility];  Service: Urology;  Laterality: Left;   CYSTOSCOPY W/ URETERAL STENT PLACEMENT  05/04/2012   Procedure: CYSTOSCOPY WITH STENT REPLACEMENT;  Surgeon: Milford Cage, MD;  Location: [facility];  Service: Urology;  Laterality: Left;   CYSTOSCOPY WITH STENT PLACEMENT Left 04/04/2012   CYSTOSCOPY WITH URETEROSCOPY  05/04/2012   Procedure: CYSTOSCOPY WITH URETEROSCOPY;  Surgeon: Milford Cage, MD;  Location: [facility];  Service: Urology;  Laterality: Left;       I & D EXTREMITY Right 02/11/2022   Procedure: IRRIGATION AND DEBRIDEMENT  OF HAND AND FOREARM;  Surgeon: Bradly Bienenstock, MD;  Location: [facility];  Service: Orthopedics;  Laterality: Right;   LEFT HEART CATH AND CORONARY ANGIOGRAPHY N/A 07/29/2018   Procedure: LEFT HEART CATH AND CORONARY ANGIOGRAPHY;  Surgeon: Marykay Lex, MD;  Location: [facility];  Service: Cardiovascular;  Laterality: N/A;   LEFT URETEROSCOPIC  STONE EXTRACTION  03-14-2003   X2   Current Outpatient Medications on File Prior to Visit  Medication Sig Dispense Refill   ACCU-CHEK AVIVA PLUS test strip USE STRIP TO CHECK GLUCOSE TWICE DAILY TO THREE TIMES DAILY **NEED OFFICE VISIT FOR REFILLS** 100 each 0   aspirin 81 MG tablet Take 81 mg by mouth daily.     celecoxib (CELEBREX) 200 MG capsule Take 200 mg by mouth 2 (two) times daily.     ciprofloxacin (CILOXAN) 0.3 % ophthalmic solution Place 1 drop into both eyes See admin instructions. 1 DROP INTO EACH EYE 4 TIMES DAILY FOR 2 DAYS AFTER EACH MONTHLY INJECTION     Continuous Blood Gluc Sensor (FREESTYLE LIBRE 2 SENSOR) MISC 1 Device by  Does not apply route every 14 (fourteen) days. 6 each 3   Insulin Aspart FlexPen 100 UNIT/ML SOPN Inject 30 Units into the skin 2 (two) times daily with a meal. 60 mL 3   insulin glargine (LANTUS SOLOSTAR) 100 UNIT/ML Solostar Pen Inject 24 Units into the skin at bedtime. 30 mL 2   Insulin Pen Needle (BD ULTRA-FINE PEN NEEDLES) 29G X 12.7MM MISC 1 Device by Other route in the morning, at noon, in the evening, and at bedtime. 360 each 3   liraglutide (VICTOZA) 18 MG/3ML SOPN Inject 1.8 mg into the skin daily. (Patient taking differently: Inject 1.8 mg into the skin in the morning.) 18 mL 3   nitroGLYCERIN (NITROLINGUAL) 0.4 MG/SPRAY spray Place 1 spray under the tongue every 5 (five) minutes x 3 doses as needed for chest pain. 12 g 12   pantoprazole (PROTONIX) 40 MG tablet Take 1 tablet (40 mg total) by mouth daily. 90 tablet 3   ticagrelor (BRILINTA) 60 MG TABS tablet Take 60 mg by mouth 2 (two) times daily.     valsartan (DIOVAN) 160 MG tablet Take 1 tablet (160 mg total) by mouth daily. 90 tablet 1   No current facility-administered medications on file prior to visit.   Allergies  Allergen Reactions   Beta Adrenergic Blockers     Metoprolol stopped 07/2018 due to 2nd degree type 2 AV block.   Social History   Socioeconomic History   Marital status: Married    Spouse name: Not on file   Number of children: Not on file   Years of education: Not on file   Highest education level: Not on file  Occupational History   Not on file  Tobacco Use   Smoking status: Never   Smokeless tobacco: Never  Vaping Use   Vaping Use: Never used  Substance and Sexual Activity   Alcohol use: Never   Drug use: Never   Sexual activity: Not Currently  Other Topics Concern   Not on file  Social History Narrative   Right handed    Lives with wife    Social Determinants of Health   Financial Resource Strain: Not on file  Food Insecurity: Not on file  Transportation Needs: Not on file  Physical  Activity: Not on file  Stress: Not on file  Social Connections: Not on file  Intimate Partner Violence: Not on file      Review of Systems  All other systems reviewed and are negative.      Objective:   Physical Exam Vitals reviewed.  Constitutional:      General: He is not in acute distress.    Appearance: He is well-developed. He is obese. He is not ill-appearing, toxic-appearing or diaphoretic.  HENT:  Head: Normocephalic and atraumatic.     Right Ear: Tympanic membrane and ear canal normal.     Left Ear: Ear canal normal.     Nose: Nose normal. No congestion or rhinorrhea.  Eyes:     General: No scleral icterus.       Right eye: No discharge.        Left eye: No discharge.     Extraocular Movements: Extraocular movements intact.     Conjunctiva/sclera: Conjunctivae normal.     Pupils: Pupils are equal, round, and reactive to light.  Neck:     Thyroid: No thyromegaly.     Vascular: No JVD.  Cardiovascular:     Rate and Rhythm: Normal rate and regular rhythm.     Heart sounds: Normal heart sounds. No murmur heard. Pulmonary:     Effort: Pulmonary effort is normal. No respiratory distress.     Breath sounds: Normal breath sounds. No wheezing or rales.  Chest:     Chest wall: No tenderness.  Abdominal:     General: Bowel sounds are normal. There is no distension.     Palpations: Abdomen is soft. There is no mass.     Tenderness: There is no abdominal tenderness. There is no guarding or rebound.  Musculoskeletal:     Left elbow: Swelling present. No effusion. Normal range of motion. Tenderness present.     Cervical back: Neck supple.  Lymphadenopathy:     Cervical: No cervical adenopathy.  Skin:    Findings: Erythema present. No rash.  Neurological:     General: No focal deficit present.     Mental Status: He is alert and oriented to person, place, and time. Mental status is at baseline.     Cranial Nerves: No cranial nerve deficit.     Sensory: No sensory  deficit.     Motor: No weakness.     Gait: Gait normal.     Deep Tendon Reflexes: Reflexes normal.  Psychiatric:        Mood and Affect: Mood normal.        Behavior: Behavior normal.        Thought Content: Thought content normal.        Judgment: Judgment normal.         Assessment & Plan:  Cellulitis of left upper extremity - Plan: cefTRIAXone (ROCEPHIN) injection 1 g Patient definitely has cellulitis overlying the skin of the left elbow.  My concern is that he may be developing septic bursitis.  I will start the patient on Rocephin 1 g IM x1 now and then doxycycline 100 mg p.o. twice daily for 10 days.  He has an appointment tomorrow to see his orthopedist.  I would appreciate orthopedist's opinion about whether we should perform aspiration of the olecranon bursa.  I hesitate today because I do not want to spread the infection into the bursa given that there is only mild swelling in that area.  It is not clear that its bursitis at this time.  However he has had numerous operations on his right hand due to invasive streptococcal infection and therefore I would like his orthopedist's opinion.  Thankfully he has an appointment tomorrow morning

## 2022-05-08 ENCOUNTER — Encounter: Payer: Self-pay | Admitting: Rehabilitative and Restorative Service Providers"

## 2022-05-08 ENCOUNTER — Ambulatory Visit: Payer: Medicare PPO | Admitting: Rehabilitative and Restorative Service Providers"

## 2022-05-08 DIAGNOSIS — M6281 Muscle weakness (generalized): Secondary | ICD-10-CM | POA: Diagnosis not present

## 2022-05-08 DIAGNOSIS — R278 Other lack of coordination: Secondary | ICD-10-CM

## 2022-05-08 DIAGNOSIS — M25641 Stiffness of right hand, not elsewhere classified: Secondary | ICD-10-CM

## 2022-05-08 DIAGNOSIS — M79641 Pain in right hand: Secondary | ICD-10-CM

## 2022-05-08 NOTE — Therapy (Signed)
OUTPATIENT OCCUPATIONAL THERAPY TREATMENT & PROGRESS NOTE   Patient Name: Erik Hancock MRN: [id number] DOB:07-14-1951, 71 y.o., male Today's Date: 05/08/2022  PCP: [name], MD REFERRING PROVIDER: [name], MD Surgeon: Iran Planas, MD       OT End of Session - 05/08/22 [phone]     Visit Number 12    Number of Visits 20    Date for OT Re-Evaluation 06/05/22    Authorization Type [employer]    Progress Note Due on Visit 20    OT Start Time [phone]    OT Stop Time 1015    OT Time Calculation (min) 51 min    Activity Tolerance Patient tolerated treatment well;No increased pain;Patient limited by pain    Behavior During Therapy [facility] for tasks assessed/performed              Past Medical History:  Diagnosis Date   Arthritis    "all over" (07/29/2018)   CAD S/P percutaneous coronary angioplasty    a. stent to mLAD 11/2001. b. DES to PDA/mLCx 2007. c. DES to ostial LAD 07/2018.   Chronic lower back pain    CKD (chronic kidney disease), stage III (HCC)    CKD (chronic kidney disease), stage III (HCC)    Diabetic retinopathy (HCC)    Diabetic retinopathy (HCC)    GERD (gastroesophageal reflux disease)    High cholesterol    History of kidney stones    Hypertension    Mobitz type 2 second degree atrioventricular block    a. noted during 07/2018 adm, metoprolol discontinued.   Morbid obesity ([place]) 07/28/2018   Prostatitis    Pulmonary nodule 2013   CT   Sleep apnea    stopbang=5   Suspected sleep apnea    Type II diabetes mellitus (HCC)    Past Surgical History:  Procedure Laterality Date   CARDIAC CATHETERIZATION  JUNE 2003   PATENT LAD STENT/ BODERLINE OBSTRUCTIVE DISEASE POSTERIOR DESCENDING ARTERY/ NORMAL LVF   CATARACT EXTRACTION W/ INTRAOCULAR LENS  IMPLANT, BILATERAL Bilateral 2017   CERVICAL SCOVILLE FORAMINOTOMY W/ EXCISION OF HERNIATED NUCLEC PULPOSUS  2009   C6 - 7   CORONARY ANGIOPLASTY WITH STENT PLACEMENT  03/30/2006   DR EDMUNDS - 90%  LCx_0  TAXUS  DES 3.0 X 20 -> 3.25 mm,   95% RPDA -- TAXUS DES 3.0 X 16 --> 3.5 mm   CORONARY ANGIOPLASTY WITH STENT PLACEMENT  11/2001   (Dr. Ilda Foil for Dr. Radford Pax) - mLAD_1  - TAXUS EXPRESS DES 3.5 x 15    CORONARY ANGIOPLASTY WITH STENT PLACEMENT  07/29/2018   CORONARY STENT INTERVENTION N/A 07/29/2018   Procedure: CORONARY STENT INTERVENTION;  Surgeon: Leonie Man, MD;  Location: [place] [facility];  Service: Cardiovascular;  Laterality: N/A;   CYSTOSCOPY W/ RETROGRADES  05/04/2012   Procedure: CYSTOSCOPY WITH RETROGRADE PYELOGRAM;  Surgeon: Molli Hazard, MD;  Location: [facility];  Service: Urology;  Laterality: Left;   CYSTOSCOPY W/ URETERAL STENT PLACEMENT  05/04/2012   Procedure: CYSTOSCOPY WITH STENT REPLACEMENT;  Surgeon: Molli Hazard, MD;  Location: [facility];  Service: Urology;  Laterality: Left;   CYSTOSCOPY WITH STENT PLACEMENT Left 04/04/2012   CYSTOSCOPY WITH URETEROSCOPY  05/04/2012   Procedure: CYSTOSCOPY WITH URETEROSCOPY;  Surgeon: Molli Hazard, MD;  Location: [facility];  Service: Urology;  Laterality: Left;       I & D EXTREMITY Right 02/11/2022   Procedure: IRRIGATION AND DEBRIDEMENT  OF HAND AND FOREARM;  Surgeon:  Iran Planas, MD;  Location: [place];  Service: Orthopedics;  Laterality: Right;   LEFT HEART CATH AND CORONARY ANGIOGRAPHY N/A 07/29/2018   Procedure: LEFT HEART CATH AND CORONARY ANGIOGRAPHY;  Surgeon: Leonie Man, MD;  Location: [place] [facility];  Service: Cardiovascular;  Laterality: N/A;   LEFT URETEROSCOPIC STONE EXTRACTION  03-14-2003   X2   Patient Active Problem List   Diagnosis Date Noted   OSA (obstructive sleep apnea) 02/12/2022   Stage 3a chronic kidney disease (CKD) ([place]) 02/11/2022   Tenosynovitis of finger 57/84/6962   Metabolic acidosis, increased anion gap 02/11/2022   Sepsis due to group A Streptococcus ([place]) 02/11/2022   First degree heart block 08/11/2018   Mobitz type 2 second degree atrioventricular block  07/30/2018   Angina, class II ([place]) 07/29/2018   Morbid obesity ([place]) 07/28/2018   Exertional dyspnea 07/28/2018   Apnea 07/28/2018   Diabetic retinopathy ([place]) 12/24/2016   Controlled type 2 diabetes mellitus with hyperglycemia, without long-term current use of insulin ([place]) 02/24/2011   Essential hypertension 02/24/2011   Dyslipidemia, goal LDL below 70 02/24/2011   CAD s/p PCI 2019 02/24/2011   GERD (gastroesophageal reflux disease) 02/24/2011    ONSET DATE: 02/11/22 DOS   REFERRING DIAG: M65.9 (ICD-10-CM) - Tenosynovitis of finger  THERAPY DIAG:  Other lack of coordination  Pain in right hand  Muscle weakness (generalized)  Stiffness of right hand, not elsewhere classified   PERTINENT HISTORY: Pt has has injures to right hand and past DM II with neuropathy which may inhibit healing / risk for more injury  PRECAUTIONS: 12 weeks since I & D sx, no strong/repetitive grip and pinch advised for 3-4 months, monitor for flexion contractures.   SUBJECTIVE:  He states having left knee sx scheduled July and he has new infection on olecranon of left elbow (could be from bearing weight/pressure).  Hand is very stiff looking today but not painful. He just came from his surgeon who gave him a good report about his thumb healing.   PAIN:  Are you having pain?  Not in hand  Rating: 0/10 in right wrist now    OBJECTIVE: (All objective assessments below are from initial evaluation on: 02/25/22 unless otherwise specified.)   HAND DOMINANCE: Right   ADLs: Overall ADLs: Eval: Unable to use right hand to grab, hold items, open containers now, etc.     FUNCTIONAL OUTCOME MEASURES: Quick Dash: 59% impairment today - 03/12/22 04/07/22: Quick Dash: 56.8% impairment today  05/08/22: 31.8 %    UE ROM   02/25/22: all fingers and thumb very swollen, stiff, MF taken measures symbolic of all fingers today. Thumb measures also taken.   04/07/22: fingers / wrist continue to be very stiff, though  swelling is markedly better    Active ROM Right 02/25/2022 Right 03/05/22 Right  03/24/22 Right 04/01/22 Right 04/07/22 Right 05/08/22  Wrist flexion 47* 41* 38* 38* 38 54  Wrist extension (-15*) 13* now 26* 33* 22 40  Wrist ulnar deviation         Wrist radial deviation         Wrist pronation 85* 85*   76 71  Wrist supination (-5*) 50* 55*  40 70  (Blank rows = not tested)   Active ROM Right 02/25/2022 Right  03/24/22 Right 04/07/22 Right 05/08/22  Thumb MCP (0-60) (-18*) - 25* (-14) - 30 (-14) -27   Thumb IP (0-80) 0 - 0 (-3) - 7  0- 7   Thumb Palmar  abd/add (0-45) 32 - 40*  38*    Thumb Opposition to Index Finger 1.5cm gap today  0cm gap to IF now (3cm gap to MF now)    Long MCP (0-90) (-17) - 30  (-2) - 26 (-7) -40 (-7) 35  Long PIP (0-100) (-49) - 52 (-42) -54 (-39) -50 (-38) - 54  Long DIP (0-70) (-28) - 36 (-11) - 24 (-18) - 20 (-16) - 28  (Blank rows = not tested)     UE MMT:  Eval:  NT at eval in thumb, but wrist 2-/5 MMT unable to fully extend in gravity eliminated    HAND FUNCTION: 05/08/22: TBD next session   COORDINATION: Eval: TBD for Box & Blocks testing as well as 9HPT when able. Observed impairment in coordination in right hand and even wrist drop and poor finger use as well as thumb.   03/17/22: Box & Bocks: 17 blocks today   05/08/22: Box & Bocks: 36 blocks today (much better)     SENSATION: Eval: Impaired in volar thumb now, states he can feel LT in rt hand other fingers volarly though. He does have old wounds on knuckles from injuries he states not feeling/not realizing (is DM II with neuropathy)    EDEMA: Eval: 8.5cm circumferential thumb P1 (6.9 in Left hand), 8.6cm circumferential IF P1 (7.4cm in Left hand), whole hand very swollen   03/05/22: 8.5cm circumferential thumb P1, 8.6cm circumferential IF P1 today as well   03/24/22: 8.2cm circumferential thumb P1, 8.4cm circumferential IF P1 today as well     COGNITION: Overall cognitive status: Within  functional limits for tasks assessed and some safety awareness issues     OBSERVATIONS:  05/08/22: still very stiff looking and some disuse of right hand noted, he has been becoming left dominant more, states eating food with left hand, etc.   04/07/22: Edema looks much better in fingers, wrist and FA now. Wound looks to be well healing still. Very stiff still.     TODAY'S TREATMENT:  05/08/22: He performs AROM at fingers, thumb, wrist, forearm for exercise, review of motion and new measures. He also does functional activities and reviews home and self-care recommendations, he is much more functional around the home, but still avoids right hand slightly. OT then begins education for self-care and safety with weight bearing and considerations for upcoming knee sx.  He states understanding all recommendations and his wife is present as well.    04/07/22: He performs AROM for new measures, discusses home functional problems and recommendations. Due to problems holding silverware, he was given built up foam to try to increase fnl use of hands. He is re-edu to do as much (clean) activity with his right hand as possible. OT also adjusts his dynamic orthosis to allow for more finger PROM at MCP Js (the stiffest now), and it works much better after that. OT also goes over all exercises (stretches, light putty PRE avoiding thumb, fnl activities).  He states understanding all and that he will continue to do these as consistently as possible.    PATIENT EDUCATION: Education details: see tx section above for details  Person educated: Patient Education method: Explanation, Demonstration, and Handouts Education comprehension: verbalized understanding, returned demonstration, and needs further education     HOME EXERCISE PROGRAM: See tx section above for details    GOALS: Goals reviewed with patient? Yes     SHORT TERM GOALS: (STG required if POC>30 days)   Pt will obtain protective, custom  orthotic. Target date: 02/25/22 Goal status: MET   2.  Pt will demo/state understanding of initial HEP to improve pain levels and prerequisite motion. Target date: 03/13/22 Goal status: MET 03/19/22     LONG TERM GOALS:   Pt will improve functional ability by decreased impairment per Quick DASH assessment from 59% to 18% or better, for better quality of life. Target date: 06/05/22 Goal status: 05/08/22- Not Met, improved to 31.8% now   2.  Pt will improve grip strength in right hand from to at least 40lbs for functional use at home and in IADLs. Target date: 06/05/22 Goal status: 04/07/22- Not Met progressing to light putty squeezes but avoiding pressure on thumb still due to wound healing   3.  Pt will improve A/ROM in right thumb total flexion/extension motion from 7* to at least 80*, to have functional motion for tasks like grasp & hold.  Target date: 06/05/22 Goal status: 04/07/22- up to 20* now    4.  Pt will improve strength in right wrist from 2-/5 MMT to at least 4/5 MMT to have increased functional ability to carry out selfcare and higher-level homecare tasks with no difficulty. Target date: 06/05/22 Goal status: 05/08/22 MET- ~4/5 MMT now in limited ROM   5.  Pt will decrease pain at rest from 5/10 to 2/10 or better to have better sleep and occupational participation in daily roles. Target date: 04/10/22 Goal status: MET 04/01/22- pain has been thankfully lower or none in thumb consistently, but knee pain is biggest c/o now.      ASSESSMENT:   CLINICAL IMPRESSION: 05/08/22: After doing progress checks with him, he presents similarly to last seen, only slightly more motion in right arm/hand, better functional ability and less apprehension, however.  Thumb still healing slowly, which is holding him back as well as chronic knee pain. TAM was 46* in right MF when last seen, now up to 69* We will continue POC as set forth when last seen. OT will set him up with a good plan and updated  orthotics, then likely d/c in ~3-4 weeks before his upcoming knee sx.    PLAN: OT FREQUENCY: 2x/week   OT DURATION: 4 weeks (POC through 06/05/22); 8 additional visits    PLANNED INTERVENTIONS: self care/ADL training, therapeutic exercise, therapeutic activity, neuromuscular re-education, manual therapy, scar mobilization, passive range of motion, splinting, ultrasound, fluidotherapy, compression bandaging, moist heat, cryotherapy, patient/family education, coping strategies training, and DME and/or AE instructions   RECOMMENDED OTHER SERVICES: none now   CONSULTED AND AGREED WITH PLAN OF CARE: Patient and family member/caregiver   PLAN FOR NEXT SESSION:  Fabricate new wrist and finger dynamic orthosis, check resting extension orthosis, update HEP and review as needed, manual and modalities review all before next sx and likely D/C from OT.    Benito Mccreedy, OTR/L, CHT 05/08/2022, 12:38 PM

## 2022-05-14 ENCOUNTER — Encounter: Payer: Self-pay | Admitting: Rehabilitative and Restorative Service Providers"

## 2022-05-14 ENCOUNTER — Ambulatory Visit: Payer: Medicare PPO | Admitting: Rehabilitative and Restorative Service Providers"

## 2022-05-14 DIAGNOSIS — M79641 Pain in right hand: Secondary | ICD-10-CM | POA: Diagnosis not present

## 2022-05-14 DIAGNOSIS — M6281 Muscle weakness (generalized): Secondary | ICD-10-CM

## 2022-05-14 DIAGNOSIS — M25641 Stiffness of right hand, not elsewhere classified: Secondary | ICD-10-CM

## 2022-05-14 DIAGNOSIS — R278 Other lack of coordination: Secondary | ICD-10-CM | POA: Diagnosis not present

## 2022-05-14 NOTE — Therapy (Signed)
OUTPATIENT OCCUPATIONAL THERAPY TREATMENT NOTE   Patient Name: Erik Hancock MRN: [id number] DOB:[birth year], 71 y.o., male Today's Date: 05/14/2022  PCP: Donita Brooks, MD REFERRING PROVIDER: Alberteen Sam,* Surgeon: Bradly Bienenstock, MD       OT End of Session - 05/14/22 1016     Visit Number 13    Number of Visits 20    Date for OT Re-Evaluation 06/05/22    Authorization Type [employer]    Progress Note Due on Visit 20    OT Start Time 1016    OT Stop Time 1103    OT Time Calculation (min) 47 min    Equipment Utilized During Treatment orthotic materials    Activity Tolerance Patient tolerated treatment well;No increased pain;Patient limited by pain    Behavior During Therapy [facility] for tasks assessed/performed               Past Medical History:  Diagnosis Date   Arthritis    "all over" (07/29/2018)   CAD S/P percutaneous coronary angioplasty    a. stent to mLAD 11/2001. b. DES to PDA/mLCx 2007. c. DES to ostial LAD 07/2018.   Chronic lower back pain    CKD (chronic kidney disease), stage III (HCC)    CKD (chronic kidney disease), stage III (HCC)    Diabetic retinopathy (HCC)    Diabetic retinopathy (HCC)    GERD (gastroesophageal reflux disease)    High cholesterol    History of kidney stones    Hypertension    Mobitz type 2 second degree atrioventricular block    a. noted during 07/2018 adm, metoprolol discontinued.   Morbid obesity (HCC) 07/28/2018   Prostatitis    Pulmonary nodule 2013   CT   Sleep apnea    stopbang=5   Suspected sleep apnea    Type II diabetes mellitus (HCC)    Past Surgical History:  Procedure Laterality Date   CARDIAC CATHETERIZATION  JUNE 2003   PATENT LAD STENT/ BODERLINE OBSTRUCTIVE DISEASE POSTERIOR DESCENDING ARTERY/ NORMAL LVF   CATARACT EXTRACTION W/ INTRAOCULAR LENS  IMPLANT, BILATERAL Bilateral 2017   CERVICAL SCOVILLE FORAMINOTOMY W/ EXCISION OF HERNIATED NUCLEC PULPOSUS  2009   C6 - 7   CORONARY ANGIOPLASTY  WITH STENT PLACEMENT  03/30/2006   DR EDMUNDS - 90% LCx@OM2  TAXUS  DES 3.0 X 20 -> 3.25 mm,   95% RPDA -- TAXUS DES 3.0 X 16 --> 3.5 mm   CORONARY ANGIOPLASTY WITH STENT PLACEMENT  11/2001   (Dr. Ty Hilts for Dr. Mayford Knife) - mLAD@D2  - TAXUS EXPRESS DES 3.5 x 15    CORONARY ANGIOPLASTY WITH STENT PLACEMENT  07/29/2018   CORONARY STENT INTERVENTION N/A 07/29/2018   Procedure: CORONARY STENT INTERVENTION;  Surgeon: Marykay Lex, MD;  Location: [facility] INVASIVE CV LAB;  Service: Cardiovascular;  Laterality: N/A;   CYSTOSCOPY W/ RETROGRADES  05/04/2012   Procedure: CYSTOSCOPY WITH RETROGRADE PYELOGRAM;  Surgeon: Milford Cage, MD;  Location: [facility] ORS;  Service: Urology;  Laterality: Left;   CYSTOSCOPY W/ URETERAL STENT PLACEMENT  05/04/2012   Procedure: CYSTOSCOPY WITH STENT REPLACEMENT;  Surgeon: Milford Cage, MD;  Location: [facility] ORS;  Service: Urology;  Laterality: Left;   CYSTOSCOPY WITH STENT PLACEMENT Left 04/04/2012   CYSTOSCOPY WITH URETEROSCOPY  05/04/2012   Procedure: CYSTOSCOPY WITH URETEROSCOPY;  Surgeon: Milford Cage, MD;  Location: [facility] ORS;  Service: Urology;  Laterality: Left;       I & D EXTREMITY Right 02/11/2022   Procedure: IRRIGATION AND DEBRIDEMENT  OF HAND AND FOREARM;  Surgeon: Bradly Bienenstock, MD;  Location[facility] OR;  Service: Orthopedics;  Laterality: Right;   LEFT HEART CATH AND CORONARY ANGIOGRAPHY N/A 07/29/2018   Procedure: LEFT HEART CATH AND CORONARY ANGIOGRAPHY;  Surgeon: Marykay Lex, MD;  Location: [facility] INVASIVE CV LAB;  Service: Cardiovascular;  Laterality: N/A;   LEFT URETEROSCOPIC STONE EXTRACTION  03-14-2003   X2   Patient Active Problem List   Diagnosis Date Noted   OSA (obstructive sleep apnea) 02/12/2022   Stage 3a chronic kidney disease (CKD) (HCC) 02/11/2022   Tenosynovitis of finger 02/11/2022   Metabolic acidosis, increased anion gap 02/11/2022   Sepsis due to group A Streptococcus (HCC) 02/11/2022   First degree heart block 08/11/2018    Mobitz type 2 second degree atrioventricular block 07/30/2018   Angina, class II (HCC) 07/29/2018   Morbid obesity (HCC) 07/28/2018   Exertional dyspnea 07/28/2018   Apnea 07/28/2018   Diabetic retinopathy (HCC) 12/24/2016   Controlled type 2 diabetes mellitus with hyperglycemia, without long-term current use of insulin (HCC) 02/24/2011   Essential hypertension 02/24/2011   Dyslipidemia, goal LDL below 70 02/24/2011   CAD s/p PCI 2019 02/24/2011   GERD (gastroesophageal reflux disease) 02/24/2011    ONSET DATE: 02/11/22 DOS   REFERRING DIAG: M65.9 (ICD-10-CM) - Tenosynovitis of finger  THERAPY DIAG:  Other lack of coordination  Pain in right hand  Muscle weakness (generalized)  Stiffness of right hand, not elsewhere classified   PERTINENT HISTORY: Pt has has injures to right hand and past DM II with neuropathy which may inhibit healing / risk for more injury  PRECAUTIONS: 12+ weeks since I & D sx, no strong/repetitive grip and pinch advised for 3-4 months, monitor for flexion contractures.   SUBJECTIVE:  He brings all old orthotics today, states visiting his mother in hospital. Here with wife per usual, in transport chair per usual. He states possibly having to put off knee sx due to new antibiotics and new elbow infection.    PAIN:  Are you having pain?  Not in hand  Rating: 0/10 in right wrist now    OBJECTIVE: (All objective assessments below are from initial evaluation on: 02/25/22 unless otherwise specified.)   HAND DOMINANCE: Right   ADLs: Overall ADLs: Eval: Unable to use right hand to grab, hold items, open containers now, etc.     FUNCTIONAL OUTCOME MEASURES: Quick Dash: 59% impairment today - 03/12/22 04/07/22: Quick Dash: 56.8% impairment today  05/08/22: 31.8 %    UE ROM   02/25/22: all fingers and thumb very swollen, stiff, MF taken measures symbolic of all fingers today. Thumb measures also taken.   04/07/22: fingers / wrist continue to be very stiff,  though swelling is markedly better    Active ROM Right 02/25/2022 Right 03/05/22 Right  03/24/22 Right 04/01/22 Right 04/07/22 Right 05/08/22  Wrist flexion 47* 41* 38* 38* 38 54  Wrist extension (-15*) 13* now 26* 33* 22 40  Wrist ulnar deviation         Wrist radial deviation         Wrist pronation 85* 85*   76 71  Wrist supination (-5*) 50* 55*  40 70  (Blank rows = not tested)   Active ROM Right 02/25/2022 Right  03/24/22 Right 04/07/22 Right 05/08/22  Thumb MCP (0-60) (-18*) - 25* (-14) - 30 (-14) -27   Thumb IP (0-80) 0 - 0 (-3) - 7  0- 7   Thumb Palmar abd/add (0-45) 32 -  40*  38*    Thumb Opposition to Index Finger 1.5cm gap today  0cm gap to IF now (3cm gap to MF now)    Long MCP (0-90) (-17) - 30  (-2) - 26 (-7) -40 (-7) 35  Long PIP (0-100) (-49) - 52 (-42) -54 (-39) -50 (-38) - 54  Long DIP (0-70) (-28) - 36 (-11) - 24 (-18) - 20 (-16) - 28  (Blank rows = not tested)     UE MMT:  Eval:  NT at eval in thumb, but wrist 2-/5 MMT unable to fully extend in gravity eliminated    HAND FUNCTION: 05/08/22: TBD next session   COORDINATION: Eval: TBD for Box & Blocks testing as well as 9HPT when able. Observed impairment in coordination in right hand and even wrist drop and poor finger use as well as thumb.   03/17/22: Box & Bocks: 17 blocks today   05/08/22: Box & Bocks: 36 blocks today (much better)     SENSATION: Eval: Impaired in volar thumb now, states he can feel LT in rt hand other fingers volarly though. He does have old wounds on knuckles from injuries he states not feeling/not realizing (is DM II with neuropathy)    EDEMA: Eval: 8.5cm circumferential thumb P1 (6.9 in Left hand), 8.6cm circumferential IF P1 (7.4cm in Left hand), whole hand very swollen   03/05/22: 8.5cm circumferential thumb P1, 8.6cm circumferential IF P1 today as well   03/24/22: 8.2cm circumferential thumb P1, 8.4cm circumferential IF P1 today as well     COGNITION: Overall cognitive status: Within  functional limits for tasks assessed and some safety awareness issues     OBSERVATIONS:  05/08/22: still very stiff looking and some disuse of right hand noted, he has been becoming left dominant more, states eating food with left hand, etc.   04/07/22: Edema looks much better in fingers, wrist and FA now. Wound looks to be well healing still. Very stiff still.     TODAY'S TREATMENT:  05/14/22: OT spends most of this session checking orthotics and fabricating new, complex, dynamic wrist flex and ext orthosis. OT was unable to complete this orthotic this session, but it will be finished in next session. He also performs fnl activities today with Velcro board (grasp, roll, pinch and twist) and large pegs (pushing, pulling, FMS manipulations). His finger ext orthotic is working and fitting well, so he was recommended to wear at night for finger ext stretch still. He and wife both state he understands HEP and will continue independently as able.   05/08/22: He performs AROM at fingers, thumb, wrist, forearm for exercise, review of motion and new measures. He also does functional activities and reviews home and self-care recommendations, he is much more functional around the home, but still avoids right hand slightly. OT then begins education for self-care and safety with weight bearing and considerations for upcoming knee sx.  He states understanding all recommendations and his wife is present as well.   PATIENT EDUCATION: Education details: see tx section above for details  Person educated: Patient Education method: Explanation, Demonstration, and Handouts Education comprehension: verbalized understanding, returned demonstration, and needs further education     HOME EXERCISE PROGRAM: See tx section above for details    GOALS: Goals reviewed with patient? Yes     SHORT TERM GOALS: (STG required if POC>30 days)   Pt will obtain protective, custom orthotic. Target date: 02/25/22 Goal status:  MET   2.  Pt will demo/state understanding  of initial HEP to improve pain levels and prerequisite motion. Target date: 03/13/22 Goal status: MET 03/19/22     LONG TERM GOALS:   Pt will improve functional ability by decreased impairment per Quick DASH assessment from 59% to 18% or better, for better quality of life. Target date: 06/05/22 Goal status: 05/08/22- Not Met, improved to 31.8% now   2.  Pt will improve grip strength in right hand from to at least 40lbs for functional use at home and in IADLs. Target date: 06/05/22 Goal status: 04/07/22- Not Met progressing to light putty squeezes but avoiding pressure on thumb still due to wound healing   3.  Pt will improve A/ROM in right thumb total flexion/extension motion from 7* to at least 80*, to have functional motion for tasks like grasp & hold.  Target date: 06/05/22 Goal status: 04/07/22- up to 20* now    4.  Pt will improve strength in right wrist from 2-/5 MMT to at least 4/5 MMT to have increased functional ability to carry out selfcare and higher-level homecare tasks with no difficulty. Target date: 06/05/22 Goal status: 05/08/22 MET- ~4/5 MMT now in limited ROM   5.  Pt will decrease pain at rest from 5/10 to 2/10 or better to have better sleep and occupational participation in daily roles. Target date: 04/10/22 Goal status: MET 04/01/22- pain has been thankfully lower or none in thumb consistently, but knee pain is biggest c/o now.      ASSESSMENT:   CLINICAL IMPRESSION: 05/14/22: OT is working with them to finalize POC with useful orthotics that will be able to be used for a long period of time to aid his recovery as long as it takes.   05/08/22: After doing progress checks with him, he presents similarly to last seen, only slightly more motion in right arm/hand, better functional ability and less apprehension, however.  Thumb still healing slowly, which is holding him back as well as chronic knee pain. TAM was 46* in right MF when  last seen, now up to 56* We will continue POC as set forth when last seen. OT will set him up with a good plan and updated orthotics, then likely d/c in ~3-4 weeks before his upcoming knee sx.    PLAN: OT FREQUENCY: 2x/week   OT DURATION: 4 weeks (POC through 06/05/22); 8 additional visits    PLANNED INTERVENTIONS: self care/ADL training, therapeutic exercise, therapeutic activity, neuromuscular re-education, manual therapy, scar mobilization, passive range of motion, splinting, ultrasound, fluidotherapy, compression bandaging, moist heat, cryotherapy, patient/family education, coping strategies training, and DME and/or AE instructions   RECOMMENDED OTHER SERVICES: none now   CONSULTED AND AGREED WITH PLAN OF CARE: Patient and family member/caregiver   PLAN FOR NEXT SESSION:  Finish orthotic(s), update POC as needed and ensure he can continue at home independently.   Fannie Knee, OTR/L, CHT 05/14/2022, 11:47 AM

## 2022-05-15 DIAGNOSIS — M25562 Pain in left knee: Secondary | ICD-10-CM | POA: Diagnosis not present

## 2022-05-19 NOTE — Patient Instructions (Addendum)
DUE TO COVID-19 ONLY TWO VISITORS  (aged 70 and older)  IS ALLOWED TO COME WITH YOU AND STAY IN THE WAITING ROOM ONLY DURING PRE OP AND PROCEDURE.   **NO VISITORS ARE ALLOWED IN THE SHORT STAY AREA OR RECOVERY ROOM!!**  IF YOU WILL BE ADMITTED INTO THE HOSPITAL YOU ARE ALLOWED ONLY FOUR SUPPORT PEOPLE DURING VISITATION HOURS ONLY (7 AM -8PM)   The support person(s) must pass our screening, gel in and out Visitors GUEST BADGE MUST BE WORN VISIBLY  One adult visitor may remain with you overnight and MUST be in the room by 8 P.M.   You are not required to [employer] often Do NOT share personal items Notify your provider if you are in close contact with someone who has COVID or you develop fever 100.4 or greater, new onset of sneezing, cough, sore throat, shortness of breath or body aches.        Your procedure is scheduled on:  Tuesday  06-02-22   Report to [facility] Main Entrance    Report to admitting at 10:40 AM   Call this number if you have problems the morning of surgery [phone]   Do not eat food :After Midnight the night before surgery   After Midnight you may have the following liquids until 10:00 AM DAY OF SURGERY  Clear Liquid Diet Water Black Coffee (sugar ok, NO MILK/CREAM OR CREAMERS)  Tea (sugar ok, NO MILK/CREAM OR CREAMERS) regular and decaf                             Plain Jell-O (NO RED)                                           Fruit ices (not with fruit pulp, NO RED)                                     Popsicles (NO RED)                                                                  Juice: apple, WHITE grape, WHITE cranberry Sports drinks like Gatorade (NO RED) Clear broth(vegetable,chicken,beef)                    The day of surgery:  Drink ONE (1) Pre-Surgery G2 at 10:00 AM the morning of surgery. Drink in one sitting. Do not sip.  This drink was given to you during your hospital  pre-op appointment visit. Nothing else to  drink after completing the Pre-Surgery G2.          If you have questions, please contact your surgeon's office.   FOLLOW ANY ADDITIONAL PRE OP INSTRUCTIONS YOU RECEIVED FROM YOUR SURGEON'S OFFICE!!!  Hold your BRILLINTA 5 days before your surgery and Hold your Aspirin 5-7 days before your surgery   Oral Hygiene is also important to reduce your risk of infection.  Remember - BRUSH YOUR TEETH THE MORNING OF SURGERY WITH YOUR REGULAR TOOTHPASTE   Take these medicines the morning of surgery with A SIP OF WATER: Pantoprazole, Rosuvastatin.  Okay to use eyedrops   DO NOT [place]. PHARMACY WILL DISPENSE MEDICATIONS LISTED ON YOUR MEDICATION LIST TO YOU DURING YOUR ADMISSION [place]!  How to Manage Your Diabetes Before and After Surgery  Why is it important to control my blood sugar before and after surgery? Improving blood sugar levels before and after surgery helps healing and can limit problems. A way of improving blood sugar control is eating a healthy diet by:  Eating less sugar and carbohydrates  Increasing activity/exercise  Talking with your doctor about reaching your blood sugar goals High blood sugars (greater than 180 mg/dL) can raise your risk of infections and slow your recovery, so you will need to focus on controlling your diabetes during the weeks before surgery. Make sure that the doctor who takes care of your diabetes knows about your planned surgery including the date and location.  How do I manage my blood sugar before surgery? Check your blood sugar at least 4 times a day, starting 2 days before surgery, to make sure that the level is not too high or low. Check your blood sugar the morning of your surgery when you wake up and every 2 hours until you get to the Short Stay unit. If your blood sugar is less than 70 mg/dL, you will need to treat for low blood sugar: Do not take insulin. Treat a low  blood sugar (less than 70 mg/dL) with  cup of clear juice (cranberry or apple), 4 glucose tablets, OR glucose gel. Recheck blood sugar in 15 minutes after treatment (to make sure it is greater than 70 mg/dL). If your blood sugar is not greater than 70 mg/dL on recheck, call [phone] for further instructions. Report your blood sugar to the short stay nurse when you get to Short Stay.  If you are admitted to the hospital after surgery: Your blood sugar will be checked by the staff and you will probably be given insulin after surgery (instead of oral diabetes medicines) to make sure you have good blood sugar levels. The goal for blood sugar control after surgery is 80-180 mg/dL.   WHAT DO I DO ABOUT MY DIABETES MEDICATION?  Do not take oral diabetes medicines (pills) the morning of surgery.  THE DAY BEFORE SURGERY:  Take Victoza as prescribed      Take Novolog insulin as prescribed (no bedtime dose)      Take 50% (12 units) of Lantus Insulin at bedtime      THE MORNING OF SURGERY: Take 50% of Novolog Insulin if CBG >220. Do not take Victoza or .Lantus Insulin  Reviewed and Endorsed by [facility] Patient Education Committee, August 2015                                You may not have any metal on your body including  jewelry, and body piercing             Do not wear  lotions, powders, cologne, or deodorant              Men may shave face and neck.    Bring small overnight bag day of surgery.  Do not bring valuables to the hospital. [facility] IS NOT  RESPONSIBLE   FOR VALUABLES.   Please read over the following fact sheets you were given: IF [place] [id number] [name])  [place] - Preparing for Surgery Before surgery, you can play an important role.  Because skin is not sterile, your skin needs to be as free of germs as possible.  You can reduce the number of germs on your skin by washing with CHG (chlorahexidine  gluconate) soap before surgery.  CHG is an antiseptic cleaner which kills germs and bonds with the skin to continue killing germs even after washing. Please DO NOT use if you have an allergy to CHG or antibacterial soaps.  If your skin becomes reddened/irritated stop using the CHG and inform your nurse when you arrive at Short Stay. Do not shave (including legs and underarms) for at least 48 hours prior to the first CHG shower.  You may shave your face/neck.  Please follow these instructions carefully:  1.  Shower with CHG Soap the night before surgery and the  morning of surgery.  2.  If you choose to wash your hair, wash your hair first as usual with your normal  shampoo.  3.  After you shampoo, rinse your hair and body thoroughly to remove the shampoo.                             4.  Use CHG as you would any other liquid soap.  You can apply chg directly to the skin and wash.  Gently with a scrungie or clean washcloth.  5.  Apply the CHG Soap to your body ONLY FROM THE NECK DOWN.   Do not use on face/ open                           Wound or open sores. Avoid contact with eyes, ears mouth and genitals (private parts).                       Wash face,  Genitals (private parts) with your normal soap.             6.  Wash thoroughly, paying special attention to the area where your  surgery  will be performed.  7.  Thoroughly rinse your body with warm water from the neck down.  8.  DO NOT shower/wash with your normal soap after using and rinsing off the CHG Soap.                9.  Pat yourself dry with a clean towel.            10.  Wear clean pajamas.            11.  Place clean sheets on your bed the night of your first shower and do not  sleep with pets.  Day of Surgery : Do not apply any lotions/deodorants the morning of surgery.  Please wear clean clothes to the hospital/surgery center.  FAILURE TO FOLLOW THESE INSTRUCTIONS MAY RESULT IN THE CANCELLATION OF YOUR SURGERY  PATIENT  SIGNATURE_________________________________  NURSE SIGNATURE__________________________________  ________________________________________________________________________      Erik Hancock    An incentive spirometer is a tool that can help keep your lungs clear and active. This tool measures how well you are filling your lungs with each breath. Taking long deep breaths may help reverse or  decrease the chance of developing breathing (pulmonary) problems (especially infection) following: A long period of time when you are unable to move or be active. BEFORE THE PROCEDURE  If the spirometer includes an indicator to show your best effort, your nurse or respiratory therapist will set it to a desired goal. If possible, sit up straight or lean slightly forward. Try not to slouch. Hold the incentive spirometer in an upright position. INSTRUCTIONS FOR USE  Sit on the edge of your bed if possible, or sit up as far as you can in bed or on a chair. Hold the incentive spirometer in an upright position. Breathe out normally. Place the mouthpiece in your mouth and seal your lips tightly around it. Breathe in slowly and as deeply as possible, raising the piston or the ball toward the top of the column. Hold your breath for 3-5 seconds or for as long as possible. Allow the piston or ball to fall to the bottom of the column. Remove the mouthpiece from your mouth and breathe out normally. Rest for a few seconds and repeat Steps 1 through 7 at least 10 times every 1-2 hours when you are awake. Take your time and take a few normal breaths between deep breaths. The spirometer may include an indicator to show your best effort. Use the indicator as a goal to work toward during each repetition. After each set of 10 deep breaths, practice coughing to be sure your lungs are clear. If you have an incision (the cut made at the time of surgery), support your incision when coughing by placing a pillow or rolled up  towels firmly against it. Once you are able to get out of bed, walk around indoors and cough well. You may stop using the incentive spirometer when instructed by your caregiver.  RISKS AND COMPLICATIONS Take your time so you do not get dizzy or light-headed. If you are in pain, you may need to take or ask for pain medication before doing incentive spirometry. It is harder to take a deep breath if you are having pain. AFTER USE Rest and breathe slowly and easily. It can be helpful to keep track of a log of your progress. Your caregiver can provide you with a simple table to help with this. If you are using the spirometer at home, follow these instructions: SEEK MEDICAL CARE IF:  You are having difficultly using the spirometer. You have trouble using the spirometer as often as instructed. Your pain medication is not giving enough relief while using the spirometer. You develop fever of 100.5 F (38.1 C) or higher.                                                                                                    SEEK IMMEDIATE MEDICAL CARE IF:  You cough up bloody sputum that had not been present before. You develop fever of 102 F (38.9 C) or greater. You develop worsening pain at or near the incision site. MAKE SURE YOU:  Understand these instructions. Will watch your condition. Will get help right away if you are  not doing well or get worse. Document Released: 03/22/2007 Document Revised: 02/01/2012 Document Reviewed: 05/23/2007 [facility] Patient Information 2014 [place], [place].

## 2022-05-20 ENCOUNTER — Encounter: Payer: Self-pay | Admitting: Rehabilitative and Restorative Service Providers"

## 2022-05-20 ENCOUNTER — Ambulatory Visit: Payer: Medicare PPO | Admitting: Rehabilitative and Restorative Service Providers"

## 2022-05-20 ENCOUNTER — Telehealth: Payer: Self-pay

## 2022-05-20 DIAGNOSIS — M25641 Stiffness of right hand, not elsewhere classified: Secondary | ICD-10-CM

## 2022-05-20 DIAGNOSIS — R278 Other lack of coordination: Secondary | ICD-10-CM | POA: Diagnosis not present

## 2022-05-20 DIAGNOSIS — M79641 Pain in right hand: Secondary | ICD-10-CM

## 2022-05-20 DIAGNOSIS — M6281 Muscle weakness (generalized): Secondary | ICD-10-CM

## 2022-05-20 NOTE — Progress Notes (Signed)
COVID Vaccine received:  []  No [x]  Yes  Date of any COVID positive Test in last 90 days:  PCP - , MD Cardiologist - , MD     Clearance - Epic 02-09-22 by Chilton Si, PA   Chest x-ray -  EKG -  02-11-2022 Stress Test - 09-24-21 Epic ECHO - 02-12-22   Epic Cardiac Cath - 2019  Epic  Pacemaker/ICD device last checked: Date:       []  N/A Spinal Cord Stimulator:[]  No []  Yes   Other Implants:   History of Sleep Apnea? []  No []  Yes  []  unknown Sleep Study Date:  yes  08-22-2018  EPIC CPAP used?- []  No []  Yes  (Instruct to bring their mask & Tubing)  Does the patient monitor blood sugar? []  No []  Yes  []  N/A Does patient have a 2020 or Dexacom? []  No []  Yes   Fasting Blood Sugar Ranges-  Checks Blood Sugar _____ times a day  Blood Thinner Instructions: Brilinta- Hold 5 days,  Aspirin Instructions: Aspirin 81 mg -Hold 5-7 days Last Dose:  Activity level:  Can go up a flight of stairs and perform activities of daily living without stopping and without symptoms of chest pain or shortness of breath.[]  No []  Yes  []  N/A  Able to do exercises without symptoms []  No []  Yes  []  N/A  Patient unable to complete ADLs without assistance []  No []  Yes  []  N/A    Comments:   Anesthesia review: CAD, Mobitz 2, HTN, DM, CKD,   Patient denies shortness of breath, fever, cough and chest pain at PAT appointment  Patient verbalized understanding and agreement to the Pre-Surgical Instructions that were given to them at this PAT appointment. Patient was also educated of the need to review these PAT instructions again prior to his/her surgery.I reviewed the appropriate phone numbers to call if they have any and questions or concerns.

## 2022-05-20 NOTE — Telephone Encounter (Signed)
Pt called left msg regarding pt's meds, Lantus. 05/19/22  05/20/22, tried calling pt back to confirm msg/meds, no answer, LM for pt to return call.

## 2022-05-20 NOTE — Therapy (Signed)
OUTPATIENT OCCUPATIONAL THERAPY TREATMENT & DISCHARGE NOTE   Patient Name: Erik Hancock MRN: [id number] DOB:Jun 10, 1951, 71 y.o., male Today's Date: 05/20/2022  PCP: [name], MD REFERRING PROVIDER: Sherilyn Cooter, MD Surgeon: Iran Planas, MD       OT End of Session - 05/20/22 0930     Visit Number 14    Number of Visits 20    Date for OT Re-Evaluation 06/05/22    Authorization Type [employer]    Progress Note Due on Visit 20    OT Start Time 0930    OT Stop Time 1110    OT Time Calculation (min) 100 min    Equipment Utilized During Treatment orthotic materials    Activity Tolerance Patient tolerated treatment well;No increased pain;Patient limited by pain    Behavior During Therapy [facility] for tasks assessed/performed             Past Medical History:  Diagnosis Date   Arthritis    "all over" (07/29/2018)   CAD S/P percutaneous coronary angioplasty    a. stent to mLAD 11/2001. b. DES to PDA/mLCx 2007. c. DES to ostial LAD 07/2018.   Chronic lower back pain    CKD (chronic kidney disease), stage III (HCC)    CKD (chronic kidney disease), stage III (HCC)    Diabetic retinopathy (HCC)    Diabetic retinopathy (HCC)    GERD (gastroesophageal reflux disease)    High cholesterol    History of kidney stones    Hypertension    Mobitz type 2 second degree atrioventricular block    a. noted during 07/2018 adm, metoprolol discontinued.   Morbid obesity ([place]) 07/28/2018   Prostatitis    Pulmonary nodule 2013   CT   Sleep apnea    stopbang=5   Suspected sleep apnea    Type II diabetes mellitus (HCC)    Past Surgical History:  Procedure Laterality Date   CARDIAC CATHETERIZATION  JUNE 2003   PATENT LAD STENT/ BODERLINE OBSTRUCTIVE DISEASE POSTERIOR DESCENDING ARTERY/ NORMAL LVF   CATARACT EXTRACTION W/ INTRAOCULAR LENS  IMPLANT, BILATERAL Bilateral 2017   CERVICAL SCOVILLE FORAMINOTOMY W/ EXCISION OF HERNIATED NUCLEC PULPOSUS  2009   C6 - 7   CORONARY  ANGIOPLASTY WITH STENT PLACEMENT  03/30/2006   DR EDMUNDS - 90% LCx'@OM2'  TAXUS  DES 3.0 X 20 -> 3.25 mm,   95% RPDA -- TAXUS DES 3.0 X 16 --> 3.5 mm   CORONARY ANGIOPLASTY WITH STENT PLACEMENT  11/2001   (Dr. Ilda Foil for Dr. Radford Pax) - mLAD'@D2'  - TAXUS EXPRESS DES 3.5 x 15    CORONARY ANGIOPLASTY WITH STENT PLACEMENT  07/29/2018   CORONARY STENT INTERVENTION N/A 07/29/2018   Procedure: CORONARY STENT INTERVENTION;  Surgeon: Leonie Man, MD;  Location: [place] [facility];  Service: Cardiovascular;  Laterality: N/A;   CYSTOSCOPY W/ RETROGRADES  05/04/2012   Procedure: CYSTOSCOPY WITH RETROGRADE PYELOGRAM;  Surgeon: Molli Hazard, MD;  Location: [facility];  Service: Urology;  Laterality: Left;   CYSTOSCOPY W/ URETERAL STENT PLACEMENT  05/04/2012   Procedure: CYSTOSCOPY WITH STENT REPLACEMENT;  Surgeon: Molli Hazard, MD;  Location: [facility];  Service: Urology;  Laterality: Left;   CYSTOSCOPY WITH STENT PLACEMENT Left 04/04/2012   CYSTOSCOPY WITH URETEROSCOPY  05/04/2012   Procedure: CYSTOSCOPY WITH URETEROSCOPY;  Surgeon: Molli Hazard, MD;  Location: [facility];  Service: Urology;  Laterality: Left;       I & D EXTREMITY Right 02/11/2022   Procedure: IRRIGATION AND DEBRIDEMENT  OF HAND AND FOREARM;  Surgeon: Iran Planas, MD;  Location: [place];  Service: Orthopedics;  Laterality: Right;   LEFT HEART CATH AND CORONARY ANGIOGRAPHY N/A 07/29/2018   Procedure: LEFT HEART CATH AND CORONARY ANGIOGRAPHY;  Surgeon: Leonie Man, MD;  Location: [place] [facility];  Service: Cardiovascular;  Laterality: N/A;   LEFT URETEROSCOPIC STONE EXTRACTION  03-14-2003   X2   Patient Active Problem List   Diagnosis Date Noted   OSA (obstructive sleep apnea) 02/12/2022   Stage 3a chronic kidney disease (CKD) ([place]) 02/11/2022   Tenosynovitis of finger 05/16/7627   Metabolic acidosis, increased anion gap 02/11/2022   Sepsis due to group A Streptococcus ([place]) 02/11/2022   First degree heart block  08/11/2018   Mobitz type 2 second degree atrioventricular block 07/30/2018   Angina, class II ([place]) 07/29/2018   Morbid obesity ([place]) 07/28/2018   Exertional dyspnea 07/28/2018   Apnea 07/28/2018   Diabetic retinopathy ([place]) 12/24/2016   Controlled type 2 diabetes mellitus with hyperglycemia, without long-term current use of insulin ([place]) 02/24/2011   Essential hypertension 02/24/2011   Dyslipidemia, goal LDL below 70 02/24/2011   CAD s/p PCI 2019 02/24/2011   GERD (gastroesophageal reflux disease) 02/24/2011    ONSET DATE: 02/11/22 DOS   REFERRING DIAG: M65.9 (ICD-10-CM) - Tenosynovitis of finger  THERAPY DIAG:  Other lack of coordination  Pain in right hand  Muscle weakness (generalized)  Stiffness of right hand, not elsewhere classified   PERTINENT HISTORY: Pt has has injures to right hand and past DM II with neuropathy which may inhibit healing / risk for more injury  PRECAUTIONS: 13+ weeks since I & D sx, no strong/repetitive grip and pinch advised for 3-4 months, monitor for flexion contractures.   SUBJECTIVE:  He states he is planning on moving forward with knee surgery, will get blood work tomorrow and is now done with antibiotics. He also has new skin tears on bicep of left arm, states arm got caught on chain link fence at home.    PAIN:  Are you having pain?  Not in hand  Rating: 0/10 in right wrist now    OBJECTIVE: (All objective assessments below are from initial evaluation on: 02/25/22 unless otherwise specified.)   ADLs: 05/20/22: Overall ADLs: he states now able to do light chores around the home and yard. Better able to open containers, do self-care, etc.      FUNCTIONAL OUTCOME MEASURES: Evaluation: Quick Dash: 59% impairment today - 03/12/22 04/07/22: Quick Dash: 56.8% impairment today  05/08/22: 31.8 % impairment today    UE ROM   02/25/22: all fingers and thumb very swollen, stiff, MF taken measures symbolic of all fingers today. Thumb measures also  taken.     Active ROM Right 02/25/2022 Right 05/20/22  Wrist flexion 47* 54  Wrist extension (-15*) 40  Wrist pronation 85* 75  Wrist supination (-5*) 69  (Blank rows = not tested)   Active ROM Right 02/25/2022 Right 05/20/22  Thumb MCP (0-60) (-18*) - 25* 0- 28  Thumb IP (0-80) 0 - 0 (-15*) - 22  Thumb Palmar abd/add (0-45) 32 - 40* 32- 56*  Thumb Opposition to Index Finger 1.5cm gap today Can oppose to IF but ~0.5cm gap to MF tip today  Long MCP (0-90) (-17) - 30  0 - 37  Long PIP (0-100) (-49) - 52 (-37) - 61  Long DIP (0-70) (-28) - 36 (-17) - 27  (Blank rows = not tested)  HAND FUNCTION: 05/20/22: Grip right hand 3.5# (limited by motion and swelling)   COORDINATION: Eval: TBD for Box & Blocks testing as well as 9HPT when able. Observed impairment in coordination in right hand and even wrist drop and poor finger use as well as thumb.   03/17/22: Box & Bocks: 17 blocks today   05/08/22: Box & Bocks: 36 blocks today (much better)     SENSATION: Eval: Impaired in volar thumb now, states he can feel LT in rt hand other fingers volarly though. He does have old wounds on knuckles from injuries he states not feeling/not realizing (is DM II with neuropathy)   \ EDEMA: Eval: 8.5cm circumferential thumb P1 (6.9 in Left hand), 8.6cm circumferential IF P1 (7.4cm in Left hand), whole hand very swollen   03/24/22: 8.2cm circumferential thumb P1, 8.4cm circumferential IF P1 today as well     COGNITION: Overall cognitive status: Within functional limits for tasks assessed and some safety awareness issues     OBSERVATIONS:  05/20/22: OT noted new skin tears and abrasions on left biceps area today. He states "catching it on a chain link fence."   05/08/22: still very stiff looking and some disuse of right hand noted, he has been becoming left dominant more, states eating food with left hand, etc.     TODAY'S TREATMENT:  05/20/22: OT finishes complex, dynamic orthotic for mobilization of  wrist in static progressive flexion or extension or fingers in dynamic flexion (or a combination of the three).  It fits well, doesn't rub, is able to achieve a "moderate" stretch in all desired planes without pain. He was educated to wear daily 2-3 for 15 min periods (or as tolerated) to help mobilize wrist and fingers. He can wear this while he is recovering from knee surgery.   OT also goes over entire HEP recommendations (as below) ensuring teach-back and understanding. OT notices new wounds (abrasions and skin tears) on his left arm near bicep today and re-educates for safety, rest, keeping wounds clean, prevention of infection, etc. OT reviews light, safe activities to do at home to encourage finger and wrist motion.  He states no questions at end, states comfortable with discharging to self-care after this visit to focus on new issues upcoming knee sx in next 2 weeks.    Exercises - Standing Shoulder Flexion Full Range  - 3-4 x daily - 1-2 sets - 10-15 reps - Forearm Supination Stretch  - 3-4 x daily - 3-5 reps - 15 sec hold - Wrist Pronation Stretch  - 4-6 x daily - 1 sets - 10-15 reps - Seated Wrist Flexion with Overpressure  - 3-4 x daily - 3-5 reps - 15 sec hold - Wrist Extension Stretch Pronated  - 3-4 x daily - 3-5 reps - 15 hold - Seated Thumb MP Flexion PROM  - 4-6 x daily - 1 sets - 10-15 reps - Hand PROM MCP Flexion  - 4-6 x daily - 3-5 reps - 15 hold - PROM Composite Flexion  - 4-6 x daily - 1 sets - 10-15 reps - PUSH KNUCKLES DOWN  - 4-6 x daily - 5 reps - 15 seconds hold - Thumb Opposition  - 2-3 x daily - 10 reps - Tendon Glides  - 3-4 x daily - 3-5 reps - 2-3 seconds hold - Bend and Pull Back Wrist SLOWLY  - 3-4 x daily - 1-2 sets - 10-15 reps - Wrist Flexion with Resistance  - 1-2 x daily - 1 sets -  10-15 reps - Wrist Extension with Resistance  - 1-2 x daily - 1 sets - 10-15 reps - Seated Finger DIP Flexion AROM with Blocking  - 4-6 x daily - 1 sets - 10-15 reps - Hand  AROM PIP Blocking  - 4-6 x daily - 1 sets - 10-15 reps - Hand AROM Reverse Blocking  - 4-6 x daily - 1 sets - 10-15 reps   PATIENT EDUCATION: Education details: see tx section above for details  Person educated: Patient Education method: Explanation, Demonstration, and Handouts Education comprehension: verbalized understanding, returned demonstration, and needs further education     HOME EXERCISE PROGRAM: Access Code: [email or web address] URL: [email or web address] Date: 05/20/2022 Prepared by: Benito Mccreedy   GOALS: Goals reviewed with patient? Yes     SHORT TERM GOALS: (STG required if POC>30 days)   Pt will obtain protective, custom orthotic. Target date: 02/25/22 Goal status: MET   2.  Pt will demo/state understanding of initial HEP to improve pain levels and prerequisite motion. Target date: 03/13/22 Goal status: MET 03/19/22     LONG TERM GOALS:   Pt will improve functional ability by decreased impairment per Quick DASH assessment from 59% to 18% or better, for better quality of life. Target date: 06/05/22 Goal status: 05/08/22- Not Met, improved to 31.8% now   2.  Pt will improve grip strength in right hand from to at least 40lbs for functional use at home and in IADLs. Target date: 06/05/22 Goal status: 05/20/22- Not Met 3.5# now, very stiff   3.  Pt will improve A/ROM in right thumb total flexion/extension motion from 7* to at least 80*, to have functional motion for tasks like grasp & hold.  Target date: 06/05/22 Goal status: 05/20/22- Not Met but progressed. 35* TAM at IP J and MCP J today    4.  Pt will improve strength in right wrist from 2-/5 MMT to at least 4/5 MMT to have increased functional ability to carry out selfcare and higher-level homecare tasks with no difficulty. Target date: 06/05/22 Goal status: 05/08/22 MET- ~4/5 MMT now in limited ROM   5.  Pt will decrease pain at rest from 5/10 to 2/10 or better to have better sleep and occupational  participation in daily roles. Target date: 04/10/22 Goal status: MET 04/01/22- pain has been thankfully lower or none in thumb consistently, but knee pain is biggest c/o now.      ASSESSMENT:   CLINICAL IMPRESSION: 05/20/22: He has been limited by knee pain, unhealing thumb, swelling, and likely sedate lifestyle as wife has reported. Also has new/recurrent infections and wounds.  Now he plans on knee sx in 2 weeks, and OT has edu for caution and care after that time and also given long-term HEP and orthoses for him to use to manage hand issues during his "down time" post-op. He will D/C OP OT now.    PLAN: OT FREQUENCY: D/C   OT DURATION: D/C    PLANNED INTERVENTIONS: self care/ADL training, therapeutic exercise, therapeutic activity, neuromuscular re-education, manual therapy, scar mobilization, passive range of motion, splinting, ultrasound, fluidotherapy, compression bandaging, moist heat, cryotherapy, patient/family education, coping strategies training, and DME and/or AE instructions   RECOMMENDED OTHER SERVICES: none now   CONSULTED AND AGREED WITH PLAN OF CARE: Patient and family member/caregiver   PLAN FOR NEXT SESSION:  D/C now due to max benefit at this time and to allow for knee sx and focus there.   Benito Mccreedy, OTR/L, CHT 05/20/2022, 5:50 PM  OCCUPATIONAL THERAPY DISCHARGE SUMMARY  Visits from Start of Care: 14  Current functional level related to goals / functional outcomes: Pt did not meet most goals, partially due to serious comorbidities, new infections, and slow healing thumb wound. He did get a full home exercise program and multiple static and dynamic orthoses to help mobilize stiff hand and wrist and he states able to keep managing on his own now.   Remaining deficits: Pt has significant stiffness, weakness, loss of motion, though all improved since start of care. He also has knee pain for which he is seeking sx in 2 weeks.   Education / Equipment: Pt has  all needed materials and education. Pt understands how to continue on with self-management. See tx notes for more details.   Patient agrees to discharge due to max benefits received from outpatient occupational therapy / hand therapy at this time.   Benito Mccreedy, OTR/L, CHT 05/20/22

## 2022-05-21 ENCOUNTER — Other Ambulatory Visit: Payer: Self-pay

## 2022-05-21 ENCOUNTER — Encounter (HOSPITAL_COMMUNITY): Payer: Self-pay

## 2022-05-21 ENCOUNTER — Encounter (HOSPITAL_COMMUNITY)
Admission: RE | Admit: 2022-05-21 | Discharge: 2022-05-21 | Disposition: A | Discharge status: Home / Self Care | Payer: Medicare PPO | Source: Ambulatory Visit | Attending: Orthopedic Surgery | Admitting: Orthopedic Surgery

## 2022-05-21 VITALS — BP 151/67 | HR 64 | Temp 98.6°F | Resp 16 | Ht 69.0 in | Wt 262.0 lb

## 2022-05-21 DIAGNOSIS — I129 Hypertensive chronic kidney disease with stage 1 through stage 4 chronic kidney disease, or unspecified chronic kidney disease: Secondary | ICD-10-CM | POA: Insufficient documentation

## 2022-05-21 DIAGNOSIS — Z01812 Encounter for preprocedural laboratory examination: Secondary | ICD-10-CM | POA: Diagnosis not present

## 2022-05-21 DIAGNOSIS — M1712 Unilateral primary osteoarthritis, left knee: Secondary | ICD-10-CM | POA: Insufficient documentation

## 2022-05-21 DIAGNOSIS — Z7902 Long term (current) use of antithrombotics/antiplatelets: Secondary | ICD-10-CM | POA: Insufficient documentation

## 2022-05-21 DIAGNOSIS — N183 Chronic kidney disease, stage 3 unspecified: Secondary | ICD-10-CM | POA: Diagnosis not present

## 2022-05-21 DIAGNOSIS — E1122 Type 2 diabetes mellitus with diabetic chronic kidney disease: Secondary | ICD-10-CM | POA: Diagnosis not present

## 2022-05-21 DIAGNOSIS — Z794 Long term (current) use of insulin: Secondary | ICD-10-CM

## 2022-05-21 DIAGNOSIS — G4730 Sleep apnea, unspecified: Secondary | ICD-10-CM | POA: Diagnosis not present

## 2022-05-21 DIAGNOSIS — Z955 Presence of coronary angioplasty implant and graft: Secondary | ICD-10-CM | POA: Diagnosis not present

## 2022-05-21 DIAGNOSIS — K219 Gastro-esophageal reflux disease without esophagitis: Secondary | ICD-10-CM | POA: Diagnosis not present

## 2022-05-21 DIAGNOSIS — I2510 Atherosclerotic heart disease of native coronary artery without angina pectoris: Secondary | ICD-10-CM | POA: Diagnosis not present

## 2022-05-21 DIAGNOSIS — Z01818 Encounter for other preprocedural examination: Secondary | ICD-10-CM

## 2022-05-21 DIAGNOSIS — E119 Type 2 diabetes mellitus without complications: Secondary | ICD-10-CM

## 2022-05-21 LAB — CBC
HCT: 42.9 % (ref 39.0–52.0)
Hemoglobin: 13.5 g/dL (ref 13.0–17.0)
MCH: 28.1 pg (ref 26.0–34.0)
MCHC: 31.5 g/dL (ref 30.0–36.0)
MCV: 89.2 fL (ref 80.0–100.0)
Platelets: 334 10*3/uL (ref 150–400)
RBC: 4.81 MIL/uL (ref 4.22–5.81)
RDW: 14.7 % (ref 11.5–15.5)
WBC: 9.2 10*3/uL (ref 4.0–10.5)
nRBC: 0 % (ref 0.0–0.2)

## 2022-05-21 LAB — BASIC METABOLIC PANEL
Anion gap: 8 (ref 5–15)
BUN: 13 mg/dL (ref 8–23)
CO2: 25 mmol/L (ref 22–32)
Calcium: 9.1 mg/dL (ref 8.9–10.3)
Chloride: 111 mmol/L (ref 98–111)
Creatinine, Ser: 0.99 mg/dL (ref 0.61–1.24)
GFR, Estimated: >60 mL/min (ref >60)
Glucose, Bld: 82 mg/dL (ref 70–99)
Potassium: 3.8 mmol/L (ref 3.5–5.1)
Sodium: 144 mmol/L (ref 135–145)

## 2022-05-21 LAB — HEMOGLOBIN A1C
Hgb A1c MFr Bld: 6.6 % — ABNORMAL HIGH (ref 4.8–5.6)
Mean Plasma Glucose: 143 mg/dL

## 2022-05-21 LAB — SURGICAL PCR SCREEN
MRSA, PCR: NEGATIVE
Staphylococcus aureus: POSITIVE — AB

## 2022-05-21 LAB — GLUCOSE, CAPILLARY: Glucose-Capillary: 80 mg/dL (ref 70–99)

## 2022-05-21 NOTE — Progress Notes (Signed)
Patient's PCR screen came back as Negative for MRSA but Positive for Staph. This was sent to Dr. Eulah Pont for review.

## 2022-05-22 NOTE — Anesthesia Preprocedure Evaluation (Addendum)
Anesthesia Evaluation  Patient identified by MRN, date of birth, ID band Patient awake    Reviewed: Allergy & Precautions, NPO status , Patient's Chart, lab work & pertinent test results  Airway Mallampati: III  TM Distance: >3 FB Neck ROM: Full    Dental no notable dental hx. (+) Missing, Dental Advisory Given,    Pulmonary sleep apnea ,  Positive sleep study, hasnt gotten CPAP yet   Pulmonary exam normal breath sounds clear to auscultation       Cardiovascular hypertension (162/90 in preop, per pt has been high recently), Pt. on medications + CAD and + Cardiac Stents (last stent 2019, brillinta last dose: 7/6 )  Normal cardiovascular exam+ dysrhythmias (2nd degree AVB)  Rhythm:Regular Rate:Normal  Myocardial Perfusion 09/24/21 Low risk stress nuclear study with normal perfusion and normal left ventricular regional and global systolic function.   Neuro/Psych negative psych ROS   GI/Hepatic Neg liver ROS, GERD  Medicated and Controlled,  Endo/Other  diabetes, Well Controlled, Type 2, Insulin DependentMorbid obesityBMI 40 a1c 6.6  Renal/GU Renal diseaseCKD 3  negative genitourinary   Musculoskeletal  (+) Arthritis , Osteoarthritis,    Abdominal (+) + obese,   Peds negative pediatric ROS (+)  Hematology negative hematology ROS (+) Hb 13.5, plt 334   Anesthesia Other Findings   Reproductive/Obstetrics negative OB ROS                           Anesthesia Physical Anesthesia Plan  ASA: 3  Anesthesia Plan: Spinal, MAC and Regional   Post-op Pain Management: Regional block* and Tylenol PO (pre-op)*   Induction:   PONV Risk Score and Plan: 2 and Propofol infusion and TIVA  Airway Management Planned: Natural Airway and Nasal Cannula  Additional Equipment: None  Intra-op Plan:   Post-operative Plan:   Informed Consent: I have reviewed the patients History and Physical, chart, labs and  discussed the procedure including the risks, benefits and alternatives for the proposed anesthesia with the patient or authorized representative who has indicated his/her understanding and acceptance.     Dental advisory given  Plan Discussed with: CRNA  Anesthesia Plan Comments:       Anesthesia Quick Evaluation

## 2022-05-22 NOTE — Progress Notes (Signed)
Anesthesia Chart Review   Case: [id number] Date/Time: 06/02/22 1256   Procedure: UNICOMPARTMENTAL KNEE (Left: Knee)   Anesthesia type: Spinal   Pre-op diagnosis: OA LEFT KNEE   Location: [facility]OM 08 / [facility]   Surgeons: Renette Butters, MD       DISCUSSION:71 y.o. never smoker with h/o HTN, GERD, sleep apnea, DM II, CKD III, CAD s/p PCI, left knee OA scheduled for above procedure 06/02/2022 with Dr. Edmonia Lynch.   Pt seen by cardiology for preoperative evaluation 02/09/2022.  Per OV note, "Pt has a history of ischemic heart disease with prior PCI.  He is able to complete 4.0 METS without angina.  He is mainly limited by knee pain.  He had a reassuring stress test in November 2022.  According to the RCRI, he has a 6.6% risk of Mace.  Given his recent stress test and lack of symptoms, I do not think that additional testing would mitigate his risk.   Therefore, based on ACC/AHA guidelines, the patient would be at acceptable risk for the planned procedure without further cardiovascular testing."  Pt advised to hold brilinta 5 days prior to procedure and ASA 5-7 days prior.   Anticipate pt can proceed with planned procedure barring acute status change.   VS: BP (!) 151/67   Pulse 64   Temp 37 C (Oral)   Resp 16   Ht 5\' 9"  (1.753 m)   Wt 118.8 kg   SpO2 98%   BMI 38.69 kg/m   PROVIDERS: [name], MD is PCP  Primary Cardiologist:  Skeet Latch, MD  LABS: Labs reviewed: Acceptable for surgery. (all labs ordered are listed, but only abnormal results are displayed)  Labs Reviewed  SURGICAL PCR SCREEN - Abnormal; Notable for the following components:      Result Value   Staphylococcus aureus POSITIVE (*)    All other components within normal limits  HEMOGLOBIN A1C - Abnormal; Notable for the following components:   Hgb A1c MFr Bld 6.6 (*)    All other components within normal limits  BASIC METABOLIC PANEL  CBC  GLUCOSE, CAPILLARY      IMAGES:   EKG: 02/11/22 Rate 94 bpm  Sinus rhythm with 1st degree A-V block ST & T wave abnormality, consider lateral ischemia Prolonged QT Abnormal ECG When compared with ECG of 30-Jul-2018 02:54, QT has lengthened  CV: Echo 02/12/22 1. Left ventricular ejection fraction, by estimation, is 60 to 65%. The  left ventricle has normal function. The left ventricle has no regional  wall motion abnormalities. There is moderate concentric left ventricular  hypertrophy. Left ventricular  diastolic parameters are indeterminate.   2. Right ventricular systolic function is normal. The right ventricular  size is normal. There is normal pulmonary artery systolic pressure.   3. Left atrial size was mildly dilated.   4. The mitral valve is normal in structure. No evidence of mitral valve  regurgitation. No evidence of mitral stenosis.   5. The aortic valve is tricuspid. There is mild calcification of the  aortic valve. There is mild thickening of the aortic valve. Aortic valve  regurgitation is not visualized. No aortic stenosis is present.   6. Aortic dilatation noted. There is mild dilatation of the ascending  aorta, measuring 38 mm.   7. The inferior vena cava is dilated in size with >50% respiratory  variability, suggesting right atrial pressure of 8 mmHg.   Myocardial Perfusion 09/24/21 Low risk stress nuclear study with normal perfusion and  normal left ventricular regional and global systolic function.   Past Medical History:  Diagnosis Date   Arthritis    "all over" (07/29/2018)   CAD S/P percutaneous coronary angioplasty    a. stent to mLAD 11/2001. b. DES to PDA/mLCx 2007. c. DES to ostial LAD 07/2018.   Chronic lower back pain    CKD (chronic kidney disease), stage III (HCC)    CKD (chronic kidney disease), stage III (HCC)    Diabetic retinopathy (HCC)    Diabetic retinopathy (HCC)    GERD (gastroesophageal reflux disease)    High cholesterol    History of kidney stones     Hypertension    Mobitz type 2 second degree atrioventricular block    a. noted during 07/2018 adm, metoprolol discontinued.   Morbid obesity (HCC) 07/28/2018   Prostatitis    Pulmonary nodule 2013   CT   Sleep apnea    stopbang=5   Suspected sleep apnea    Type II diabetes mellitus (HCC)     Past Surgical History:  Procedure Laterality Date   CARDIAC CATHETERIZATION  JUNE 2003   PATENT LAD STENT/ BODERLINE OBSTRUCTIVE DISEASE POSTERIOR DESCENDING ARTERY/ NORMAL LVF   CATARACT EXTRACTION W/ INTRAOCULAR LENS  IMPLANT, BILATERAL Bilateral 2017   CERVICAL SCOVILLE FORAMINOTOMY W/ EXCISION OF HERNIATED NUCLEC PULPOSUS  2009   C6 - 7   CORONARY ANGIOPLASTY WITH STENT PLACEMENT  03/30/2006   DR EDMUNDS - 90% LCx@OM2  TAXUS  DES 3.0 X 20 -> 3.25 mm,   95% RPDA -- TAXUS DES 3.0 X 16 --> 3.5 mm   CORONARY ANGIOPLASTY WITH STENT PLACEMENT  11/2001   (Dr. Ty Hilts for Dr. Mayford Knife) - mLAD@D2  - TAXUS EXPRESS DES 3.5 x 15    CORONARY ANGIOPLASTY WITH STENT PLACEMENT  07/29/2018   CORONARY STENT INTERVENTION N/A 07/29/2018   Procedure: CORONARY STENT INTERVENTION;  Surgeon: Marykay Lex, MD;  Location: [facility];  Service: Cardiovascular;  Laterality: N/A;   CYSTOSCOPY W/ RETROGRADES  05/04/2012   Procedure: CYSTOSCOPY WITH RETROGRADE PYELOGRAM;  Surgeon: Milford Cage, MD;  Location: [facility];  Service: Urology;  Laterality: Left;   CYSTOSCOPY W/ URETERAL STENT PLACEMENT  05/04/2012   Procedure: CYSTOSCOPY WITH STENT REPLACEMENT;  Surgeon: Milford Cage, MD;  Location: [facility];  Service: Urology;  Laterality: Left;   CYSTOSCOPY WITH STENT PLACEMENT Left 04/04/2012   CYSTOSCOPY WITH URETEROSCOPY  05/04/2012   Procedure: CYSTOSCOPY WITH URETEROSCOPY;  Surgeon: Milford Cage, MD;  Location: [facility];  Service: Urology;  Laterality: Left;       I & D EXTREMITY Right 02/11/2022   Procedure: IRRIGATION AND DEBRIDEMENT  OF HAND AND FOREARM;  Surgeon: Bradly Bienenstock, MD;   Location: [facility];  Service: Orthopedics;  Laterality: Right;   LEFT HEART CATH AND CORONARY ANGIOGRAPHY N/A 07/29/2018   Procedure: LEFT HEART CATH AND CORONARY ANGIOGRAPHY;  Surgeon: Marykay Lex, MD;  Location: [facility] INVASIVE CV LAB;  Service: Cardiovascular;  Laterality: N/A;   LEFT URETEROSCOPIC STONE EXTRACTION  03-14-2003   X2    MEDICATIONS:  ACCU-CHEK AVIVA PLUS test strip   aspirin 81 MG tablet   celecoxib (CELEBREX) 200 MG capsule   ciprofloxacin (CILOXAN) 0.3 % ophthalmic solution   Continuous Blood Gluc Sensor (FREESTYLE LIBRE 2 SENSOR) MISC   doxycycline (VIBRA-TABS) 100 MG tablet   Insulin Aspart FlexPen 100 UNIT/ML SOPN   insulin glargine (LANTUS SOLOSTAR) 100 UNIT/ML Solostar Pen   Insulin Pen Needle (BD ULTRA-FINE PEN NEEDLES) 29G  X 12. MISC   liraglutide (VICTOZA) 18 MG/3ML SOPN   nitroGLYCERIN (NITROLINGUAL) 0.4 MG/SPRAY spray   pantoprazole (PROTONIX) 40 MG tablet   rosuvastatin (CRESTOR) 10 MG tablet   ticagrelor (BRILINTA) 60 MG TABS tablet   valsartan (DIOVAN) 320 MG tablet   No current facility-administered medications for this encounter.    Jodell Cipro Ward, PA-C [facility] [phone]

## 2022-05-29 DIAGNOSIS — L03011 Cellulitis of right finger: Secondary | ICD-10-CM | POA: Diagnosis not present

## 2022-06-01 NOTE — Care Plan (Signed)
Ortho Bundle Case Management Note  Patient Details  Name: Erik Hancock MRN: [id number] Date of Birth: 11/22/1951   Met with patient in the office prior to surgery. WIll discharge to home with family to assist. Rolling walker and CPM ordered for home use. OPPT set up with [place]. Patient and MD in agreement with plan. Choice offered.                DME Arranged:  CPM, Walker rolling DME Agency:  [employer]  HH Arranged:    [place] Agency:     Additional Comments: Please contact me with any questions of if this plan should need to change.  Ladell Heads,  [place] [facility]  [phone] 06/01/2022, 9:57 AM

## 2022-06-02 ENCOUNTER — Observation Stay (HOSPITAL_COMMUNITY)
Admission: RE | Admit: 2022-06-02 | Discharge: 2022-06-03 | Disposition: A | Discharge status: Home / Self Care | Payer: Medicare PPO | Source: Ambulatory Visit | Attending: Orthopedic Surgery | Admitting: Orthopedic Surgery

## 2022-06-02 ENCOUNTER — Other Ambulatory Visit: Payer: Self-pay

## 2022-06-02 ENCOUNTER — Observation Stay (HOSPITAL_COMMUNITY): Payer: Medicare PPO

## 2022-06-02 ENCOUNTER — Encounter (HOSPITAL_COMMUNITY): Payer: Self-pay | Admitting: Orthopedic Surgery

## 2022-06-02 ENCOUNTER — Ambulatory Visit (HOSPITAL_COMMUNITY): Payer: Medicare PPO | Admitting: Physician Assistant

## 2022-06-02 ENCOUNTER — Encounter (HOSPITAL_COMMUNITY)
Admission: RE | Disposition: A | Discharge status: Home / Self Care | Payer: Self-pay | Source: Ambulatory Visit | Attending: Orthopedic Surgery

## 2022-06-02 ENCOUNTER — Ambulatory Visit (HOSPITAL_BASED_OUTPATIENT_CLINIC_OR_DEPARTMENT_OTHER): Payer: Medicare PPO | Admitting: Anesthesiology

## 2022-06-02 DIAGNOSIS — M1712 Unilateral primary osteoarthritis, left knee: Secondary | ICD-10-CM | POA: Diagnosis not present

## 2022-06-02 DIAGNOSIS — I129 Hypertensive chronic kidney disease with stage 1 through stage 4 chronic kidney disease, or unspecified chronic kidney disease: Secondary | ICD-10-CM | POA: Insufficient documentation

## 2022-06-02 DIAGNOSIS — Z955 Presence of coronary angioplasty implant and graft: Secondary | ICD-10-CM | POA: Insufficient documentation

## 2022-06-02 DIAGNOSIS — Z9889 Other specified postprocedural states: Secondary | ICD-10-CM | POA: Diagnosis not present

## 2022-06-02 DIAGNOSIS — I2510 Atherosclerotic heart disease of native coronary artery without angina pectoris: Secondary | ICD-10-CM | POA: Diagnosis not present

## 2022-06-02 DIAGNOSIS — E11319 Type 2 diabetes mellitus with unspecified diabetic retinopathy without macular edema: Secondary | ICD-10-CM | POA: Insufficient documentation

## 2022-06-02 DIAGNOSIS — Z794 Long term (current) use of insulin: Secondary | ICD-10-CM

## 2022-06-02 DIAGNOSIS — Z7982 Long term (current) use of aspirin: Secondary | ICD-10-CM | POA: Insufficient documentation

## 2022-06-02 DIAGNOSIS — N183 Chronic kidney disease, stage 3 unspecified: Secondary | ICD-10-CM | POA: Insufficient documentation

## 2022-06-02 DIAGNOSIS — E1122 Type 2 diabetes mellitus with diabetic chronic kidney disease: Secondary | ICD-10-CM | POA: Insufficient documentation

## 2022-06-02 DIAGNOSIS — G8918 Other acute postprocedural pain: Secondary | ICD-10-CM | POA: Diagnosis not present

## 2022-06-02 DIAGNOSIS — Z96652 Presence of left artificial knee joint: Secondary | ICD-10-CM

## 2022-06-02 DIAGNOSIS — I10 Essential (primary) hypertension: Secondary | ICD-10-CM | POA: Diagnosis not present

## 2022-06-02 DIAGNOSIS — Z79899 Other long term (current) drug therapy: Secondary | ICD-10-CM | POA: Diagnosis not present

## 2022-06-02 DIAGNOSIS — R600 Localized edema: Secondary | ICD-10-CM | POA: Diagnosis not present

## 2022-06-02 DIAGNOSIS — Z96642 Presence of left artificial hip joint: Secondary | ICD-10-CM | POA: Diagnosis not present

## 2022-06-02 DIAGNOSIS — E119 Type 2 diabetes mellitus without complications: Secondary | ICD-10-CM

## 2022-06-02 DIAGNOSIS — Z471 Aftercare following joint replacement surgery: Secondary | ICD-10-CM | POA: Diagnosis not present

## 2022-06-02 HISTORY — PX: PARTIAL KNEE ARTHROPLASTY: SHX2174

## 2022-06-02 LAB — GLUCOSE, CAPILLARY
Glucose-Capillary: 106 mg/dL — ABNORMAL HIGH (ref 70–99)
Glucose-Capillary: 170 mg/dL — ABNORMAL HIGH (ref 70–99)

## 2022-06-02 SURGERY — ARTHROPLASTY, KNEE, UNICOMPARTMENTAL
Anesthesia: Monitor Anesthesia Care | Site: Knee | Laterality: Left

## 2022-06-02 MED ORDER — PHENOL 1.4 % MT LIQD: 1.0000 | OROMUCOSAL | Status: DC | PRN

## 2022-06-02 MED ORDER — INSULIN ASPART 100 UNIT/ML IJ SOLN
30.0000 [IU] | Freq: Two times a day (BID) | INTRAMUSCULAR | Status: DC
  Administered 2022-06-03: 30 [IU] via SUBCUTANEOUS

## 2022-06-02 MED ORDER — ALUM & MAG HYDROXIDE-SIMETH 200-200-20 MG/5ML PO SUSP: 30.0000 mL | ORAL | Status: DC | PRN

## 2022-06-02 MED ORDER — ACETAMINOPHEN 500 MG PO TABS
1000.0000 mg | ORAL_TABLET | Freq: Once | ORAL | Status: AC
  Administered 2022-06-02: 1000 mg via ORAL
  Filled 2022-06-02: qty 2

## 2022-06-02 MED ORDER — OXYCODONE HCL 5 MG PO TABS: 10.0000 mg | ORAL_TABLET | ORAL | Status: DC | PRN

## 2022-06-02 MED ORDER — TRAMADOL HCL 50 MG PO TABS
50.0000 mg | ORAL_TABLET | Freq: Four times a day (QID) | ORAL | Status: DC
  Administered 2022-06-02 – 2022-06-03 (×4): 50 mg via ORAL
  Filled 2022-06-02 (×4): qty 1

## 2022-06-02 MED ORDER — ONDANSETRON HCL 4 MG/2ML IJ SOLN
INTRAMUSCULAR | Status: AC
  Filled 2022-06-02: qty 2

## 2022-06-02 MED ORDER — MIDAZOLAM HCL 2 MG/2ML IJ SOLN: 2.0000 mg | Freq: Once | INTRAMUSCULAR | Status: AC

## 2022-06-02 MED ORDER — SODIUM CHLORIDE (PF) 0.9 % IJ SOLN
INTRAMUSCULAR | Status: AC
  Filled 2022-06-02: qty 50

## 2022-06-02 MED ORDER — TRANEXAMIC ACID-NACL 1000-0.7 MG/100ML-% IV SOLN
1000.0000 mg | INTRAVENOUS | Status: AC
  Administered 2022-06-02: 1000 mg via INTRAVENOUS
  Filled 2022-06-02: qty 100

## 2022-06-02 MED ORDER — INSULIN ASPART FLEXPEN 100 UNIT/ML ~~LOC~~ SOPN: 30.0000 [IU] | PEN_INJECTOR | Freq: Two times a day (BID) | SUBCUTANEOUS | Status: DC

## 2022-06-02 MED ORDER — ROSUVASTATIN CALCIUM 10 MG PO TABS
10.0000 mg | ORAL_TABLET | Freq: Every day | ORAL | Status: DC
  Administered 2022-06-03: 10 mg via ORAL
  Filled 2022-06-02: qty 1

## 2022-06-02 MED ORDER — LACTATED RINGERS IV SOLN: INTRAVENOUS | Status: DC

## 2022-06-02 MED ORDER — ACETAMINOPHEN 500 MG PO TABS
1000.0000 mg | ORAL_TABLET | Freq: Four times a day (QID) | ORAL | Status: AC
  Administered 2022-06-02 – 2022-06-03 (×4): 1000 mg via ORAL
  Filled 2022-06-02 (×4): qty 2

## 2022-06-02 MED ORDER — METHOCARBAMOL 500 MG PO TABS
500.0000 mg | ORAL_TABLET | Freq: Four times a day (QID) | ORAL | Status: DC | PRN
  Administered 2022-06-02: 500 mg via ORAL
  Filled 2022-06-02: qty 1

## 2022-06-02 MED ORDER — POLYETHYLENE GLYCOL 3350 17 G PO PACK: 17.0000 g | PACK | Freq: Every day | ORAL | Status: DC | PRN

## 2022-06-02 MED ORDER — TICAGRELOR 60 MG PO TABS
60.0000 mg | ORAL_TABLET | Freq: Two times a day (BID) | ORAL | Status: DC
  Administered 2022-06-02 – 2022-06-03 (×2): 60 mg via ORAL
  Filled 2022-06-02 (×3): qty 1

## 2022-06-02 MED ORDER — MENTHOL 3 MG MT LOZG
1.0000 | LOZENGE | OROMUCOSAL | Status: DC | PRN
Start: 2022-06-02 — End: 2022-06-03

## 2022-06-02 MED ORDER — PANTOPRAZOLE SODIUM 40 MG PO TBEC
40.0000 mg | DELAYED_RELEASE_TABLET | Freq: Every day | ORAL | Status: DC
  Administered 2022-06-02 – 2022-06-03 (×2): 40 mg via ORAL
  Filled 2022-06-02 (×2): qty 1

## 2022-06-02 MED ORDER — PROPOFOL 1000 MG/100ML IV EMUL
INTRAVENOUS | Status: AC
  Filled 2022-06-02: qty 100

## 2022-06-02 MED ORDER — METOCLOPRAMIDE HCL 5 MG PO TABS: 5.0000 mg | ORAL_TABLET | Freq: Three times a day (TID) | ORAL | Status: DC | PRN

## 2022-06-02 MED ORDER — BISACODYL 10 MG RE SUPP: 10.0000 mg | Freq: Every day | RECTAL | Status: DC | PRN

## 2022-06-02 MED ORDER — IRBESARTAN 150 MG PO TABS
300.0000 mg | ORAL_TABLET | Freq: Every day | ORAL | Status: DC
  Administered 2022-06-03: 300 mg via ORAL
  Filled 2022-06-02: qty 2

## 2022-06-02 MED ORDER — ONDANSETRON HCL 4 MG/2ML IJ SOLN: 4.0000 mg | Freq: Four times a day (QID) | INTRAMUSCULAR | Status: DC | PRN

## 2022-06-02 MED ORDER — DOCUSATE SODIUM 100 MG PO CAPS
100.0000 mg | ORAL_CAPSULE | Freq: Two times a day (BID) | ORAL | Status: DC
  Administered 2022-06-02 – 2022-06-03 (×2): 100 mg via ORAL
  Filled 2022-06-02 (×2): qty 1

## 2022-06-02 MED ORDER — METHOCARBAMOL 1000 MG/10ML IJ SOLN: 500.0000 mg | Freq: Four times a day (QID) | INTRAVENOUS | Status: DC | PRN

## 2022-06-02 MED ORDER — TRANEXAMIC ACID-NACL 1000-0.7 MG/100ML-% IV SOLN
1000.0000 mg | Freq: Once | INTRAVENOUS | Status: AC
  Administered 2022-06-02: 1000 mg via INTRAVENOUS
  Filled 2022-06-02: qty 100

## 2022-06-02 MED ORDER — BUPIVACAINE LIPOSOME 1.3 % IJ SUSP
INTRAMUSCULAR | Status: AC
  Filled 2022-06-02: qty 20

## 2022-06-02 MED ORDER — LIRAGLUTIDE 18 MG/3ML ~~LOC~~ SOPN: 1.8000 mg | PEN_INJECTOR | Freq: Every day | SUBCUTANEOUS | Status: DC

## 2022-06-02 MED ORDER — ORAL CARE MOUTH RINSE: 15.0000 mL | Freq: Once | OROMUCOSAL | Status: AC

## 2022-06-02 MED ORDER — BUPIVACAINE LIPOSOME 1.3 % IJ SUSP: 20.0000 mL | Freq: Once | INTRAMUSCULAR | Status: DC

## 2022-06-02 MED ORDER — PROPOFOL 500 MG/50ML IV EMUL
INTRAVENOUS | Status: DC | PRN
  Administered 2022-06-02: 30 mg via INTRAVENOUS
  Administered 2022-06-02: 50 ug/kg/min via INTRAVENOUS
  Administered 2022-06-02: 20 mg via INTRAVENOUS

## 2022-06-02 MED ORDER — METOCLOPRAMIDE HCL 5 MG/ML IJ SOLN: 5.0000 mg | Freq: Three times a day (TID) | INTRAMUSCULAR | Status: DC | PRN

## 2022-06-02 MED ORDER — DEXAMETHASONE SODIUM PHOSPHATE 10 MG/ML IJ SOLN
INTRAMUSCULAR | Status: AC
  Filled 2022-06-02: qty 1

## 2022-06-02 MED ORDER — ONDANSETRON HCL 4 MG/2ML IJ SOLN
INTRAMUSCULAR | Status: DC | PRN
  Administered 2022-06-02: 4 mg via INTRAVENOUS

## 2022-06-02 MED ORDER — 0.9 % SODIUM CHLORIDE (POUR BTL) OPTIME
TOPICAL | Status: DC | PRN
  Administered 2022-06-02: 1000 mL

## 2022-06-02 MED ORDER — CHLORHEXIDINE GLUCONATE 0.12 % MT SOLN
15.0000 mL | Freq: Once | OROMUCOSAL | Status: AC
  Administered 2022-06-02: 15 mL via OROMUCOSAL

## 2022-06-02 MED ORDER — CEFAZOLIN SODIUM-DEXTROSE 2-4 GM/100ML-% IV SOLN
2.0000 g | Freq: Four times a day (QID) | INTRAVENOUS | Status: AC
  Administered 2022-06-02 – 2022-06-03 (×2): 2 g via INTRAVENOUS
  Filled 2022-06-02 (×2): qty 100

## 2022-06-02 MED ORDER — BUPIVACAINE LIPOSOME 1.3 % IJ SUSP
INTRAMUSCULAR | Status: DC | PRN
  Administered 2022-06-02: 20 mL

## 2022-06-02 MED ORDER — FENTANYL CITRATE PF 50 MCG/ML IJ SOSY: 100.0000 ug | PREFILLED_SYRINGE | Freq: Once | INTRAMUSCULAR | Status: AC

## 2022-06-02 MED ORDER — ASPIRIN 81 MG PO TBEC
81.0000 mg | DELAYED_RELEASE_TABLET | Freq: Every day | ORAL | Status: DC
  Administered 2022-06-03: 81 mg via ORAL
  Filled 2022-06-02: qty 1

## 2022-06-02 MED ORDER — FENTANYL CITRATE (PF) 100 MCG/2ML IJ SOLN
INTRAMUSCULAR | Status: DC | PRN
  Administered 2022-06-02 (×2): 50 ug via INTRAVENOUS

## 2022-06-02 MED ORDER — MIDAZOLAM HCL 5 MG/5ML IJ SOLN
INTRAMUSCULAR | Status: DC | PRN
  Administered 2022-06-02: 2 mg via INTRAVENOUS

## 2022-06-02 MED ORDER — AMISULPRIDE (ANTIEMETIC) 5 MG/2ML IV SOLN: 10.0000 mg | Freq: Once | INTRAVENOUS | Status: DC | PRN

## 2022-06-02 MED ORDER — HYDROMORPHONE HCL 1 MG/ML IJ SOLN: 0.2500 mg | INTRAMUSCULAR | Status: DC | PRN

## 2022-06-02 MED ORDER — DEXAMETHASONE SODIUM PHOSPHATE 10 MG/ML IJ SOLN: 8.0000 mg | Freq: Once | INTRAMUSCULAR | Status: DC

## 2022-06-02 MED ORDER — HYDROMORPHONE HCL 1 MG/ML IJ SOLN: 0.5000 mg | INTRAMUSCULAR | Status: DC | PRN

## 2022-06-02 MED ORDER — DIPHENHYDRAMINE HCL 12.5 MG/5ML PO ELIX: 12.5000 mg | ORAL_SOLUTION | ORAL | Status: DC | PRN

## 2022-06-02 MED ORDER — OXYCODONE HCL 5 MG PO TABS
5.0000 mg | ORAL_TABLET | ORAL | Status: DC | PRN
  Administered 2022-06-02 – 2022-06-03 (×3): 10 mg via ORAL
  Filled 2022-06-02 (×3): qty 2

## 2022-06-02 MED ORDER — SODIUM CHLORIDE 0.9 % IR SOLN
Status: DC | PRN
  Administered 2022-06-02: 1000 mL

## 2022-06-02 MED ORDER — INSULIN GLARGINE-YFGN 100 UNIT/ML ~~LOC~~ SOLN
24.0000 [IU] | Freq: Every day | SUBCUTANEOUS | Status: DC
  Administered 2022-06-02: 24 [IU] via SUBCUTANEOUS
  Filled 2022-06-02 (×2): qty 0.24

## 2022-06-02 MED ORDER — OXYCODONE HCL 5 MG PO TABS: 5.0000 mg | ORAL_TABLET | Freq: Once | ORAL | Status: DC | PRN

## 2022-06-02 MED ORDER — FENTANYL CITRATE (PF) 100 MCG/2ML IJ SOLN
INTRAMUSCULAR | Status: AC
  Filled 2022-06-02: qty 2

## 2022-06-02 MED ORDER — ROPIVACAINE HCL 5 MG/ML IJ SOLN
INTRAMUSCULAR | Status: DC | PRN
  Administered 2022-06-02: 30 mL via PERINEURAL

## 2022-06-02 MED ORDER — DEXAMETHASONE SODIUM PHOSPHATE 10 MG/ML IJ SOLN
INTRAMUSCULAR | Status: DC | PRN
  Administered 2022-06-02 (×2): 10 mg

## 2022-06-02 MED ORDER — DEXAMETHASONE SODIUM PHOSPHATE 10 MG/ML IJ SOLN
10.0000 mg | Freq: Once | INTRAMUSCULAR | Status: AC
  Administered 2022-06-03: 10 mg via INTRAVENOUS
  Filled 2022-06-02: qty 1

## 2022-06-02 MED ORDER — MIDAZOLAM HCL 2 MG/2ML IJ SOLN
INTRAMUSCULAR | Status: AC
  Filled 2022-06-02: qty 2

## 2022-06-02 MED ORDER — ACETAMINOPHEN 325 MG PO TABS: 325.0000 mg | ORAL_TABLET | Freq: Four times a day (QID) | ORAL | Status: DC | PRN

## 2022-06-02 MED ORDER — CEFAZOLIN IN SODIUM CHLORIDE 3-0.9 GM/100ML-% IV SOLN
3.0000 g | INTRAVENOUS | Status: AC
  Administered 2022-06-02: 3 g via INTRAVENOUS
  Filled 2022-06-02: qty 100

## 2022-06-02 MED ORDER — POVIDONE-IODINE 10 % EX SWAB: Freq: Once | CUTANEOUS | Status: AC

## 2022-06-02 MED ORDER — ONDANSETRON HCL 4 MG/2ML IJ SOLN: 4.0000 mg | Freq: Once | INTRAMUSCULAR | Status: DC | PRN

## 2022-06-02 MED ORDER — ONDANSETRON HCL 4 MG PO TABS: 4.0000 mg | ORAL_TABLET | Freq: Four times a day (QID) | ORAL | Status: DC | PRN

## 2022-06-02 MED ORDER — MIDAZOLAM HCL 2 MG/2ML IJ SOLN
INTRAMUSCULAR | Status: AC
  Administered 2022-06-02: 1 mg via INTRAVENOUS
  Filled 2022-06-02: qty 2

## 2022-06-02 MED ORDER — SODIUM CHLORIDE FLUSH 0.9 % IV SOLN
INTRAVENOUS | Status: DC | PRN
  Administered 2022-06-02: 30 mL

## 2022-06-02 MED ORDER — POVIDONE-IODINE 10 % EX SWAB: Freq: Once | CUTANEOUS | Status: DC

## 2022-06-02 MED ORDER — CELECOXIB 200 MG PO CAPS
200.0000 mg | ORAL_CAPSULE | Freq: Two times a day (BID) | ORAL | Status: DC | PRN
Start: 2022-06-02 — End: 2022-06-03

## 2022-06-02 MED ORDER — OXYCODONE HCL 5 MG/5ML PO SOLN: 5.0000 mg | Freq: Once | ORAL | Status: DC | PRN

## 2022-06-02 MED ORDER — FENTANYL CITRATE PF 50 MCG/ML IJ SOSY
PREFILLED_SYRINGE | INTRAMUSCULAR | Status: AC
  Administered 2022-06-02: 50 ug via INTRAVENOUS
  Filled 2022-06-02: qty 2

## 2022-06-02 SURGICAL SUPPLY — 57 items
APL PRP STRL LF DISP 70% ISPRP (MISCELLANEOUS) ×1
BAG COUNTER SPONGE SURGICOUNT (BAG) ×3 IMPLANT
BAG SPNG CNTER NS LX DISP (BAG) ×1
BEARING TIBIAL STRL SZ3 LG (Knees) ×1 IMPLANT
BLADE SURG 15 STRL LF DISP TIS (BLADE) ×2 IMPLANT
BLADE SURG 15 STRL SS (BLADE) ×2
BNDG CMPR MED 10X6 ELC LF (GAUZE/BANDAGES/DRESSINGS) ×1
BNDG ELASTIC 6X10 VLCR STRL LF (GAUZE/BANDAGES/DRESSINGS) ×3 IMPLANT
BOWL SMART MIX CTS (DISPOSABLE) ×3 IMPLANT
BRNG TIB LRG 3 PHS 3 LT MEN (Knees) ×1 IMPLANT
CEMENT BONE REFOBACIN R1X40 US (Cement) ×1 IMPLANT
CHLORAPREP W/TINT 26 (MISCELLANEOUS) ×3 IMPLANT
CLSR STERI-STRIP ANTIMIC 1/2X4 (GAUZE/BANDAGES/DRESSINGS) ×3 IMPLANT
COMPONENT TIBIA MEDL OXFRD LFT (Joint) IMPLANT
COVER SURGICAL LIGHT HANDLE (MISCELLANEOUS) ×3 IMPLANT
CUFF TOURN SGL QUICK 34 (TOURNIQUET CUFF) ×2
CUFF TRNQT CYL 34X4.125X (TOURNIQUET CUFF) ×2 IMPLANT
DRAPE ARTHROSCOPY W/POUCH 114 (DRAPES) ×3 IMPLANT
DRAPE INCISE IOBAN 66X45 STRL (DRAPES) ×9 IMPLANT
DRAPE U-SHAPE 47X51 STRL (DRAPES) ×3 IMPLANT
DRESSING MEPILEX FLEX 4X4 (GAUZE/BANDAGES/DRESSINGS) IMPLANT
DRSG MEPILEX BORDER 4X8 (GAUZE/BANDAGES/DRESSINGS) ×3 IMPLANT
DRSG MEPILEX FLEX 4X4 (GAUZE/BANDAGES/DRESSINGS)
ELECT REM PT RETURN 15FT ADLT (MISCELLANEOUS) ×3 IMPLANT
FORCEPS GRASP 3 PRONG 3.2FR (CUTTING FORCEPS) ×3 IMPLANT
GLOVE BIO SURGEON STRL SZ7.5 (GLOVE) ×6 IMPLANT
GLOVE BIOGEL PI IND STRL 7.5 (GLOVE) ×2 IMPLANT
GLOVE BIOGEL PI IND STRL 8 (GLOVE) ×2 IMPLANT
GLOVE BIOGEL PI INDICATOR 7.5 (GLOVE) ×1
GLOVE BIOGEL PI INDICATOR 8 (GLOVE) ×1
GLOVE SURG POLYISO LF SZ7.5 (GLOVE) ×3 IMPLANT
GOWN STRL REUS W/ TWL LRG LVL3 (GOWN DISPOSABLE) ×2 IMPLANT
GOWN STRL REUS W/TWL LRG LVL3 (GOWN DISPOSABLE) ×2
HANDPIECE INTERPULSE COAX TIP (DISPOSABLE) ×2
IMMOBILIZER KNEE 20 (SOFTGOODS)
IMMOBILIZER KNEE 20 THIGH 36 (SOFTGOODS) ×2 IMPLANT
IMMOBILIZER KNEE 22  40 CIR (ORTHOPEDIC SUPPLIES) ×2
IMMOBILIZER KNEE 22 40 CIR (ORTHOPEDIC SUPPLIES) IMPLANT
IMMOBILIZER KNEE 22 UNIV (SOFTGOODS) ×1 IMPLANT
KIT TURNOVER KIT A (KITS) ×1 IMPLANT
MANIFOLD NEPTUNE II (INSTRUMENTS) ×3 IMPLANT
NS IRRIG 1000ML POUR BTL (IV SOLUTION) ×3 IMPLANT
PACK BLADE SAW RECIP 70 3 PT (BLADE) ×1 IMPLANT
PACK TOTAL KNEE CUSTOM (KITS) ×3 IMPLANT
PEG FEMORAL CEMENT STRL LRG (Knees) ×1 IMPLANT
PROTECTOR NERVE ULNAR (MISCELLANEOUS) ×3 IMPLANT
SET HNDPC FAN SPRY TIP SCT (DISPOSABLE) ×2 IMPLANT
SUCTION FRAZIER HANDLE 10FR (MISCELLANEOUS) ×2
SUCTION TUBE FRAZIER 10FR DISP (MISCELLANEOUS) ×2 IMPLANT
SUT MNCRL AB 4-0 PS2 18 (SUTURE) ×3 IMPLANT
SUT VIC AB 0 CT1 36 (SUTURE) ×3 IMPLANT
SUT VIC AB 1 CT1 36 (SUTURE) ×3 IMPLANT
SUT VIC AB 2-0 CT1 27 (SUTURE) ×2
SUT VIC AB 2-0 CT1 TAPERPNT 27 (SUTURE) ×2 IMPLANT
TIBIA MEDIAL OXFORD LEFT (Joint) ×2 IMPLANT
TRAY FOLEY MTR SLVR 16FR STAT (SET/KITS/TRAYS/PACK) ×3 IMPLANT
WRAP KNEE MAXI GEL POST OP (GAUZE/BANDAGES/DRESSINGS) ×3 IMPLANT

## 2022-06-02 NOTE — Progress Notes (Signed)
Orthopedic Tech Progress Note Patient Details:  Erik Hancock [birth year] [id number]  CPM Left Knee Left Knee Flexion (Degrees): 60 Left Knee Extension (Degrees): 0  Post Interventions Patient Tolerated: Well Instructions Provided: Care of device Ortho Devices Type of Ortho Device: Bone foam zero knee, CPM padding Ortho Device/Splint Location: LLE Ortho Device/Splint Interventions: Ordered, Application, Adjustment   Post Interventions Patient Tolerated: Well Instructions Provided: Care of device  [name] 06/02/2022, 4:46 PM

## 2022-06-02 NOTE — Interval H&P Note (Signed)
History and Physical Interval Note:  06/02/2022 11:59 AM  Erik Hancock  has presented today for surgery, with the diagnosis of OA LEFT KNEE.  The various methods of treatment have been discussed with the patient and family. After consideration of risks, benefits and other options for treatment, the patient has consented to  Procedure(s): UNICOMPARTMENTAL KNEE (Left) as a surgical intervention.  The patient's history has been reviewed, patient examined, no change in status, stable for surgery.  I have reviewed the patient's chart and labs.  Questions were answered to the patient's satisfaction.     Sheral Apley

## 2022-06-02 NOTE — Anesthesia Postprocedure Evaluation (Signed)
Anesthesia Post Note  Patient: MICHAIL BOYTE  Procedure(s) Performed: UNICOMPARTMENTAL KNEE (Left: Knee)     Patient location during evaluation: PACU Anesthesia Type: Regional, MAC and Spinal Level of consciousness: awake and alert and oriented Pain management: pain level controlled Vital Signs Assessment: post-procedure vital signs reviewed and stable Respiratory status: spontaneous breathing, nonlabored ventilation and respiratory function stable Cardiovascular status: blood pressure returned to baseline and stable Postop Assessment: no headache, no backache, spinal receding and no apparent nausea or vomiting Anesthetic complications: no   No notable events documented.  Last Vitals:  Vitals:   06/02/22 1545 06/02/22 1600  BP: 136/69 139/69  Pulse: (!) 59 (!) 58  Resp: 12 14  Temp:    SpO2: 97% 98%    Last Pain:  Vitals:   06/02/22 1600  TempSrc:   PainSc: 0-No pain                 Pervis Hocking

## 2022-06-02 NOTE — Anesthesia Procedure Notes (Signed)
Spinal  Patient location during procedure: OR Start time: 06/02/2022 1:23 PM End time: 06/02/2022 1:27 PM Reason for block: surgical anesthesia Staffing Performed: anesthesiologist  Anesthesiologist: Lannie Fields, DO Performed by: Lannie Fields, DO Authorized by: Lannie Fields, DO   Preanesthetic Checklist Completed: patient identified, IV checked, risks and benefits discussed, surgical consent, monitors and equipment checked, pre-op evaluation and timeout performed Spinal Block Patient position: sitting Prep: DuraPrep and site prepped and draped Patient monitoring: cardiac monitor, continuous pulse ox and blood pressure Approach: midline Location: L3-4 Injection technique: single-shot Needle Needle type: Pencan  Needle gauge: 24 G Needle length: 9 cm Assessment Sensory level: T6 Events: CSF return Additional Notes Functioning IV was confirmed and monitors were applied. Sterile prep and drape, including hand hygiene and sterile gloves were used. The patient was positioned and the spine was prepped. The skin was anesthetized with lidocaine.  Free flow of clear CSF was obtained prior to injecting local anesthetic into the CSF.  The spinal needle aspirated freely following injection.  The needle was carefully withdrawn.  The patient tolerated the procedure well.

## 2022-06-02 NOTE — Discharge Instructions (Signed)

## 2022-06-02 NOTE — Op Note (Signed)
06/02/2022  2:45 PM  PATIENT:  Erik Hancock    PRE-OPERATIVE DIAGNOSIS:  OA LEFT KNEE  POST-OPERATIVE DIAGNOSIS:  Same  PROCEDURE:  UNICOMPARTMENTAL KNEE  SURGEON:  Sheral Apley, MD  PHYSICIAN ASSISTANT: Levester Fresh, PA-C, he was present and scrubbed throughout the case, critical for completion in a timely fashion, and for retraction, instrumentation, and closure.   ANESTHESIA:   General  PREOPERATIVE INDICATIONS:  Erik Hancock is a  71 y.o. male with a diagnosis of OA LEFT KNEE who failed conservative measures and elected for surgical management.    The risks benefits and alternatives were discussed with the patient preoperatively including but not limited to the risks of infection, bleeding, nerve injury, cardiopulmonary complications, blood clots, the need for revision surgery, among others, and the patient was willing to proceed.  OPERATIVE IMPLANTS: Biomet Oxford mobile bearing medial compartment arthroplasty. Femoral Component: L. Tibial tray: C, Size 3 poly.   OPERATIVE FINDINGS: Endstage grade 4 medial compartment osteoarthritis. No significant changes in the lateral or patellofemoral joint  OPERATIVE PROCEDURE: The patient was brought to the operating room placed in supine position. General anesthesia was administered. IV antibiotics were given. The lower extremity was placed in the legholder and prepped and draped in usual sterile fashion.  Time out was performed.  The leg was elevated and exsanguinated and the tourniquet was inflated. Anteromedial incision was performed, and I took care to preserve the MCL. Parapatellar incision was carried out, and the osteophytes were excised, along with the medial meniscus and a small portion of the fat pad.  The extra medullary tibial cutting jig was applied, using the spoon and the 45mm G-Clamp, and I took care to protect the anterior cruciate ligament insertion and the tibial spine. The medial collateral ligament was also  protected, and I resected my proximal tibia, matching the anatomic slope.   The proximal tibial bony cut was removed in one piece, and I turned my attention to the femur.  The intramedullary femoral rod was placed using the drill, and then using the appropriate reference, I assembled the femoral jig, setting my posterior cutting block. I resected my posterior femur, and then measured my gap.   I then used the mill to match the extension gap to the flexion gap. The gaps were then measured again with the appropriate feeler gauges. Once I had balanced flexion and extension gaps, I then completed the preparation of the femur.  I milled off the anterior aspect of the distal femur to prevent impingement. I also exposed the tibia, and selected the above-named component, and then used the cutting jig to prepare the keel slot on the tibia. I also used the awl to curette out the bone to complete the preparation of the keel. The back wall was intact.  I then placed trial components, and it was found to have excellent motion, and appropriate balance.  I then cemented the components into place, cementing the tibia first, removing all excess cement, and then cementing the femur.  All loose cement was removed.  The real polyethylene insert was applied manually, and the knee was taken through functional range of motion, and found to have excellent stability and restoration of joint motion, with excellent balance.  The wounds were irrigated copiously, and the parapatellar tissue closed with Vicryl, followed by Vicryl for the subcutaneous tissue, with routine closure with Steri-Strips and sterile gauze.  The tourniquet was released, and the patient was awakened and extubated and returned to PACU in  stable and satisfactory condition. There were no complications.  POSTOPERATIVE PLAN: DVT px will consist of SCD's, mobiliation and chemical px, WBAT     Renette Butters, MD

## 2022-06-02 NOTE — Interval H&P Note (Signed)
History and Physical Interval Note:  06/02/2022 10:49 AM  Erik Hancock  has presented today for surgery, with the diagnosis of OA LEFT KNEE.  The various methods of treatment have been discussed with the patient and family. After consideration of risks, benefits and other options for treatment, the patient has consented to  Procedure(s): UNICOMPARTMENTAL KNEE (Left) as a surgical intervention.  The patient's history has been reviewed, patient examined, no change in status, stable for surgery.  I have reviewed the patient's chart and labs.  Questions were answered to the patient's satisfaction.     Sheral Apley

## 2022-06-02 NOTE — Anesthesia Procedure Notes (Signed)
Anesthesia Regional Block: Adductor canal block   Pre-Anesthetic Checklist: , timeout performed,  Correct Patient, Correct Site, Correct Laterality,  Correct Procedure, Correct Position, site marked,  Risks and benefits discussed,  Surgical consent,  Pre-op evaluation,  At surgeon's request and post-op pain management  Laterality: Left  Prep: Maximum Sterile Barrier Precautions used, chloraprep       Needles:  Injection technique: Single-shot  Needle Type: Echogenic Stimulator Needle     Needle Length: 9cm  Needle Gauge: 22     Additional Needles:   Procedures:,,,, ultrasound used (permanent image in chart),,    Narrative:  Start time: 06/02/2022 11:50 AM End time: 06/02/2022 11:55 AM Injection made incrementally with aspirations every 5 mL.  Performed by: Personally  Anesthesiologist: Lannie Fields, DO  Additional Notes: Monitors applied. No increased pain on injection. No increased resistance to injection. Injection made in 5cc increments. Good needle visualization. Patient tolerated procedure well.

## 2022-06-02 NOTE — Transfer of Care (Signed)
Immediate Anesthesia Transfer of Care Note  Patient: Erik Hancock  Procedure(s) Performed: UNICOMPARTMENTAL KNEE (Left: Knee)  Patient Location: PACU  Anesthesia Type:Spinal  Level of Consciousness: sedated, patient cooperative and responds to stimulation  Airway & Oxygen Therapy: Patient Spontanous Breathing and Patient connected to face mask oxygen  Post-op Assessment: Report given to RN and Post -op Vital signs reviewed and stable  Post vital signs: Reviewed and stable  Last Vitals:  Vitals Value Taken Time  BP 102/59 06/02/22 1526  Temp    Pulse 58 06/02/22 1529  Resp 14 06/02/22 1529  SpO2 96 % 06/02/22 1529  Vitals shown include unvalidated device data.  Last Pain:  Vitals:   06/02/22 1230  TempSrc:   PainSc: 0-No pain         Complications: No notable events documented.

## 2022-06-03 ENCOUNTER — Other Ambulatory Visit: Payer: Self-pay

## 2022-06-03 DIAGNOSIS — Z7982 Long term (current) use of aspirin: Secondary | ICD-10-CM | POA: Diagnosis not present

## 2022-06-03 DIAGNOSIS — I129 Hypertensive chronic kidney disease with stage 1 through stage 4 chronic kidney disease, or unspecified chronic kidney disease: Secondary | ICD-10-CM | POA: Diagnosis not present

## 2022-06-03 DIAGNOSIS — Z79899 Other long term (current) drug therapy: Secondary | ICD-10-CM | POA: Diagnosis not present

## 2022-06-03 DIAGNOSIS — E11319 Type 2 diabetes mellitus with unspecified diabetic retinopathy without macular edema: Secondary | ICD-10-CM | POA: Diagnosis not present

## 2022-06-03 DIAGNOSIS — M1712 Unilateral primary osteoarthritis, left knee: Secondary | ICD-10-CM | POA: Diagnosis not present

## 2022-06-03 DIAGNOSIS — Z794 Long term (current) use of insulin: Secondary | ICD-10-CM | POA: Diagnosis not present

## 2022-06-03 DIAGNOSIS — I2510 Atherosclerotic heart disease of native coronary artery without angina pectoris: Secondary | ICD-10-CM | POA: Diagnosis not present

## 2022-06-03 DIAGNOSIS — Z955 Presence of coronary angioplasty implant and graft: Secondary | ICD-10-CM | POA: Diagnosis not present

## 2022-06-03 DIAGNOSIS — E1122 Type 2 diabetes mellitus with diabetic chronic kidney disease: Secondary | ICD-10-CM | POA: Diagnosis not present

## 2022-06-03 MED ORDER — POLYETHYLENE GLYCOL 3350 17 G PO PACK
17.0000 g | PACK | Freq: Every day | ORAL | 0 refills | Status: DC
Start: 2022-06-03 — End: 2022-12-22

## 2022-06-03 MED ORDER — ACETAMINOPHEN 500 MG PO TABS
1000.0000 mg | ORAL_TABLET | Freq: Four times a day (QID) | ORAL | 0 refills | Status: AC | PRN
Start: 2022-06-03 — End: ?

## 2022-06-03 MED ORDER — METHOCARBAMOL 750 MG PO TABS: 750.0000 mg | ORAL_TABLET | Freq: Three times a day (TID) | ORAL | 0 refills | Status: DC | PRN

## 2022-06-03 MED ORDER — ONDANSETRON 4 MG PO TBDP: 4.0000 mg | ORAL_TABLET | Freq: Two times a day (BID) | ORAL | 0 refills | Status: DC | PRN

## 2022-06-03 MED ORDER — OXYCODONE HCL 10 MG PO TABS: 5.0000 mg | ORAL_TABLET | Freq: Three times a day (TID) | ORAL | 0 refills | Status: DC | PRN

## 2022-06-03 NOTE — Progress Notes (Signed)
Physical Therapy Treatment Patient Details Name: Erik Hancock MRN: [id number] DOB: 1950/12/17 Today's Date: 06/03/2022   History of Present Illness Pt is a 71 year old male s/p left unicompartmental knee replacement on 06/02/22.  PMHx: CKD, HTN, DM, CAD, OSA, s/p I&D of purulent tenosynovitis of right thumb on 02/11/22    PT Comments    Pt ambulated in hallway and practiced safe stair technique with spouse present.  Pt moving quickly and educated to slowly progress.  Pt cautioned against rushing due to fall risk.  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  Pt provided with HEP handout and had no further questions.  Pt feels ready for d/c home today.   Recommendations for follow up therapy are one component of a multi-disciplinary discharge planning process, led by the attending physician.  Recommendations may be updated based on patient status, additional functional criteria and insurance authorization.  Follow Up Recommendations  Follow physician's recommendations for discharge plan and follow up therapies     Assistance Recommended at Discharge    Patient can return home with the following Help with stairs or ramp for entrance   Equipment Recommendations  Rolling walker (2 wheels)    Recommendations for Other Services       Precautions / Restrictions Precautions Precautions: Fall;Knee Restrictions Weight Bearing Restrictions: No     Mobility  Bed Mobility Overal bed mobility: Needs Assistance Bed Mobility: Supine to Sit     Supine to sit: Supervision     General bed mobility comments: pt in recliner    Transfers Overall transfer level: Needs assistance Equipment used: Rolling walker (2 wheels) Transfers: Sit to/from Stand Sit to Stand: Min guard, Supervision           General transfer comment: verbal cues for UE and LE positioning    Ambulation/Gait Ambulation/Gait assistance: Min guard Gait Distance (Feet): 200 Feet Assistive device:  Rolling walker (2 wheels) Gait Pattern/deviations: Step-through pattern, Decreased stance time - left, Antalgic       General Gait Details: verbal cues for sequence, RW positioning, step length   Stairs Stairs: Yes Stairs assistance: Min guard Stair Management: Step to pattern, Forwards, Backwards, With walker Number of Stairs: 2 General stair comments: pt a little impulsive and holding rails and going up steps; pt does not have rails at home; verbally explained technique backwards with RW and then pt performed with spouse holding RW; pt then performed backwards with RW once more and reports understanding   Wheelchair Mobility    Modified Rankin (Stroke Patients Only)       Balance                                            Cognition Arousal/Alertness: Awake/alert Behavior During Therapy: WFL for tasks assessed/performed Overall Cognitive Status: Within Functional Limits for tasks assessed                                          Exercises     General Comments        Pertinent Vitals/Pain Pain Assessment Pain Assessment: 0-10 Pain Score: 4  Pain Location: left knee Pain Descriptors / Indicators: Sore, Aching Pain Intervention(s): Repositioned, Monitored during session    Home Living Family/patient expects to be  discharged to:: Private residence Living Arrangements: Spouse/significant other Available Help at Discharge: Family Type of Home: House Home Access: Stairs to enter Entrance Stairs-Rails: None Entrance Stairs-Number of Steps: 3   Home Layout: One level Home Equipment: Crutches      Prior Function            PT Goals (current goals can now be found in the care plan section) Acute Rehab PT Goals PT Goal Formulation: With patient Time For Goal Achievement: 06/09/22 Potential to Achieve Goals: Good Progress towards PT goals: Progressing toward goals    Frequency    7X/week      PT Plan Current plan  remains appropriate    Co-evaluation              AM-PAC PT "6 Clicks" Mobility   Outcome Measure  Help needed turning from your back to your side while in a flat bed without using bedrails?: None Help needed moving from lying on your back to sitting on the side of a flat bed without using bedrails?: A Little Help needed moving to and from a bed to a chair (including a wheelchair)?: A Little Help needed standing up from a chair using your arms (e.g., wheelchair or bedside chair)?: A Little Help needed to walk in hospital room?: A Little Help needed climbing 3-5 steps with a railing? : A Little 6 Click Score: 19    End of Session Equipment Utilized During Treatment: Gait belt Activity Tolerance: Patient tolerated treatment well Patient left: in chair;with family/visitor present;with call bell/phone within reach   PT Visit Diagnosis: Other abnormalities of gait and mobility (R26.89)     Time: 4492-0100 PT Time Calculation (min) (ACUTE ONLY): 10 min  Charges:  $Gait Training: 8-22 mins                     Erik Hancock, DPT Physical Therapist Acute Rehabilitation Services Preferred contact method: Secure Chat Weekend Pager Only: [phone] Office: [phone]    Erik Hancock 06/03/2022, 3:03 PM

## 2022-06-03 NOTE — Plan of Care (Signed)
  Problem: Activity: Goal: Risk for activity intolerance will decrease Outcome: Progressing   Problem: Nutrition: Goal: Adequate nutrition will be maintained Outcome: Progressing   Problem: Coping: Goal: Level of anxiety will decrease Outcome: Adequate for Discharge   Problem: Elimination: Goal: Will not experience complications related to bowel motility Outcome: Adequate for Discharge   Problem: Pain Managment: Goal: General experience of comfort will improve Outcome: Progressing

## 2022-06-03 NOTE — Evaluation (Signed)
Physical Therapy Evaluation Patient Details Name: Erik Hancock MRN: [id number] DOB: [birth year] Today's Date: 06/03/2022  History of Present Illness  Pt is a 71 year old male s/p left unicompartmental knee replacement on 06/02/22.  PMHx: CKD, HTN, DM, CAD, OSA, s/p I&D of purulent tenosynovitis of right thumb on 02/11/22  Clinical Impression  Patient is s/p above surgery resulting in functional limitations due to the deficits listed below (see PT Problem List).  Patient will benefit from skilled PT to increase their independence and safety with mobility to allow discharge to the venue listed below.   Pt assisted with ambulating in hallway and performed LE exercises.  Pt anticipates d/c home with spouse and starting OPPT tomorrow.  Will return to practice steps prior to d/c.        Recommendations for follow up therapy are one component of a multi-disciplinary discharge planning process, led by the attending physician.  Recommendations may be updated based on patient status, additional functional criteria and insurance authorization.  Follow Up Recommendations Follow physician's recommendations for discharge plan and follow up therapies (plans for OPPT)      Assistance Recommended at Discharge    Patient can return home with the following  Help with stairs or ramp for entrance    Equipment Recommendations Rolling walker (2 wheels)  Recommendations for Other Services       Functional Status Assessment Patient has had a recent decline in their functional status and demonstrates the ability to make significant improvements in function in a reasonable and predictable amount of time.     Precautions / Restrictions Precautions Precautions: Fall;Knee Restrictions Weight Bearing Restrictions: No      Mobility  Bed Mobility Overal bed mobility: Needs Assistance Bed Mobility: Supine to Sit     Supine to sit: Supervision          Transfers Overall transfer level: Needs  assistance Equipment used: Rolling walker (2 wheels) Transfers: Sit to/from Stand Sit to Stand: Min guard           General transfer comment: verbal cues for UE and LE positioning    Ambulation/Gait Ambulation/Gait assistance: Min guard Gait Distance (Feet): 160 Feet Assistive device: Rolling walker (2 wheels) Gait Pattern/deviations: Step-to pattern, Step-through pattern, Decreased stance time - left, Antalgic       General Gait Details: verbal cues for sequence, RW positioning, step length  Stairs            Wheelchair Mobility    Modified Rankin (Stroke Patients Only)       Balance                                             Pertinent Vitals/Pain Pain Assessment Pain Assessment: 0-10 Pain Score: 2  Pain Location: left knee Pain Descriptors / Indicators: Sore, Aching Pain Intervention(s): Monitored during session, Repositioned    Home Living Family/patient expects to be discharged to:: Private residence Living Arrangements: Spouse/significant other Available Help at Discharge: Family Type of Home: House Home Access: Stairs to enter Entrance Stairs-Rails: None Entrance Stairs-Number of Steps: 3   Home Layout: One level Home Equipment: Crutches      Prior Function Prior Level of Function : Independent/Modified Independent                     Hand Dominance  Extremity/Trunk Assessment        Lower Extremity Assessment Lower Extremity Assessment: LLE deficits/detail LLE Deficits / Details: left knee AAROM approx 2-90*, able to perform ankle pumps       Communication   Communication: No difficulties  Cognition Arousal/Alertness: Awake/alert Behavior During Therapy: WFL for tasks assessed/performed Overall Cognitive Status: Within Functional Limits for tasks assessed                                          General Comments      Exercises Total Joint Exercises Ankle Circles/Pumps:  AROM, Both, 10 reps Quad Sets: AROM, Both, 10 reps Heel Slides: AAROM, Left, 10 reps Hip ABduction/ADduction: AAROM, Left, 10 reps Straight Leg Raises: AAROM, Left, 10 reps Knee Flexion: Seated, Left, 10 reps, AROM   Assessment/Plan    PT Assessment Patient needs continued PT services  PT Problem List Decreased strength;Decreased range of motion;Decreased mobility;Decreased knowledge of use of DME;Pain;Decreased knowledge of precautions       PT Treatment Interventions Stair training;Gait training;Therapeutic exercise;Functional mobility training;Therapeutic activities;Patient/family education;DME instruction    PT Goals (Current goals can be found in the Care Plan section)  Acute Rehab PT Goals PT Goal Formulation: With patient Time For Goal Achievement: 06/09/22 Potential to Achieve Goals: Good    Frequency 7X/week     Co-evaluation               AM-PAC PT "6 Clicks" Mobility  Outcome Measure Help needed turning from your back to your side while in a flat bed without using bedrails?: None Help needed moving from lying on your back to sitting on the side of a flat bed without using bedrails?: A Little Help needed moving to and from a bed to a chair (including a wheelchair)?: A Little Help needed standing up from a chair using your arms (e.g., wheelchair or bedside chair)?: A Little Help needed to walk in hospital room?: A Little Help needed climbing 3-5 steps with a railing? : A Little 6 Click Score: 19    End of Session Equipment Utilized During Treatment: Gait belt Activity Tolerance: Patient tolerated treatment well Patient left: in chair;with family/visitor present;with call bell/phone within reach   PT Visit Diagnosis: Other abnormalities of gait and mobility (R26.89)    Time: 8206-0156 PT Time Calculation (min) (ACUTE ONLY): 19 min   Charges:   PT Evaluation $PT Eval Low Complexity: 1 Low         Kati PT, DPT Physical Therapist Acute  Rehabilitation Services Preferred contact method: Secure Chat Weekend Pager Only: [phone] Office: [phone]   Kati L Payson 06/03/2022, 12:00 PM

## 2022-06-03 NOTE — Discharge Summary (Signed)
Physician Discharge Summary  Patient ID: Erik Hancock MRN: [id number] DOB/AGE: 01/05/1951 71 y.o.  Admit date: 06/02/2022 Discharge date: 06/03/2022  Admission Diagnoses: left knee medial compartment osteoarthritis  Discharge Diagnoses:  Principal Problem:   S/P left unicompartmental knee replacement   Discharged Condition: fair  Hospital Course: Patient underwent a left partial knee replacement by Dr. Eulah PontMurphy on 06/02/22 without complications. He spent the night in observation for pain control and mobilization. He has passed his PT evaluation and is ready for discharge home.   Consults: None  Significant Diagnostic Studies: n/a  Treatments: antibiotics: Ancef, analgesia: acetaminophen, Dilaudid, Morphine, Tramadol, and Oxycodone, anticoagulation: ASA, therapies: PT, and surgery: left unicompartmental knee replacement  Discharge Exam: Blood pressure 129/71, pulse 80, temperature 98.2 F (36.8 C), temperature source Oral, resp. rate 16, height 5\' 9"  (1.753 m), weight 121.6 kg, SpO2 100 %. General appearance: alert, cooperative, and no distress Head: Normocephalic, without obvious abnormality, atraumatic Resp: clear to auscultation bilaterally Cardio: regular rate and rhythm, S1, S2 normal, no murmur, click, rub or gallop Extremities: extremities normal, atraumatic, no cyanosis or edema Pulses:  L brachial 2+ R brachial 2+  L radial 2+ R radial 2+  L inguinal 2+ R inguinal 2+  L popliteal 2+ R popliteal 2+  L posterior tibial 2+ R posterior tibial 2+  L dorsalis pedis 2+ R dorsalis pedis 2+   Neurologic: Grossly normal Incision/Wound: c/d/i  Disposition: Discharge disposition: 01-Home or Self Care       Discharge Instructions     CPM   Complete by: As directed    Continuous passive motion machine (CPM):      Use the CPM from 0 to 90 degrees for 6 hours per day.      You may break it up into 2 or 3 sessions per day.      Use CPM for 3 weeks or until you are told to  stop.   Call MD / Call 911   Complete by: As directed    If you experience chest pain or shortness of breath, CALL 911 and be transported to the hospital emergency room.  If you develope a fever above 101 F, pus (white drainage) or increased drainage or redness at the wound, or calf pain, call your surgeon's office.   Diet - low sodium heart healthy   Complete by: As directed    Discharge instructions   Complete by: As directed    You may bear weight as tolerated. Keep your dressing on and dry until follow up. Take medicine to prevent blood clots as directed. Take pain medicine as needed with the goal of transitioning to over the counter medicines.    INSTRUCTIONS AFTER JOINT REPLACEMENT   Remove items at home which could result in a fall. This includes throw rugs or furniture in walking pathways ICE to the affected joint every three hours while awake for 30 minutes at a time, for at least the first 3-5 days, and then as needed for pain and swelling.  Continue to use ice for pain and swelling. You may notice swelling that will progress down to the foot and ankle.  This is normal after surgery.  Elevate your leg when you are not up walking on it.   Continue to use the breathing machine you got in the hospital (incentive spirometer) which will help keep your temperature down.  It is common for your temperature to cycle up and down following surgery, especially at night when you are not  up moving around and exerting yourself.  The breathing machine keeps your lungs expanded and your temperature down.   DIET:  As you were doing prior to hospitalization, we recommend a well-balanced diet.  DRESSING / WOUND CARE / SHOWERING  You may shower 3 days after surgery, but keep the wounds dry during showering.  You may use an occlusive plastic wrap (Press'n Seal for example) with blue painter's tape at edges, NO SOAKING/SUBMERGING IN THE BATHTUB.  If the bandage gets wet, call the office.    ACTIVITY  Increase activity slowly as tolerated, but follow the weight bearing instructions below.   No driving for 6 weeks or until further direction given by your physician.  You cannot drive while taking narcotics.  No lifting or carrying greater than 10 lbs. until further directed by your surgeon. Avoid periods of inactivity such as sitting longer than an hour when not asleep. This helps prevent blood clots.  You may return to work once you are authorized by your doctor.    WEIGHT BEARING   Weight bearing as tolerated with assist device (walker, cane, etc) as directed, use it as long as suggested by your surgeon or therapist, typically at least 4-6 weeks.   EXERCISES  Results after joint replacement surgery are often greatly improved when you follow the exercise, range of motion and muscle strengthening exercises prescribed by your doctor. Safety measures are also important to protect the joint from further injury. Any time any of these exercises cause you to have increased pain or swelling, decrease what you are doing until you are comfortable again and then slowly increase them. If you have problems or questions, call your caregiver or physical therapist for advice.   Rehabilitation is important following a joint replacement. After just a few days of immobilization, the muscles of the leg can become weakened and shrink (atrophy).  These exercises are designed to build up the tone and strength of the thigh and leg muscles and to improve motion. Often times heat used for twenty to thirty minutes before working out will loosen up your tissues and help with improving the range of motion but do not use heat for the first two weeks following surgery (sometimes heat can increase post-operative swelling).   These exercises can be done on a training (exercise) mat, on the floor, on a table or on a bed. Use whatever works the best and is most comfortable for you.    Use music or television while  you are exercising so that the exercises are a pleasant break in your day. This will make your life better with the exercises acting as a break in your routine that you can look forward to.   Perform all exercises about fifteen times, three times per day or as directed.  You should exercise both the operative leg and the other leg as well.  Exercises include:   Quad Sets - Tighten up the muscle on the front of the thigh (Quad) and hold for 5-10 seconds.   Straight Leg Raises - With your knee straight (if you were given a brace, keep it on), lift the leg to 60 degrees, hold for 3 seconds, and slowly lower the leg.  Perform this exercise against resistance later as your leg gets stronger.  Leg Slides: Lying on your back, slowly slide your foot toward your buttocks, bending your knee up off the floor (only go as far as is comfortable). Then slowly slide your foot back down until your leg  is flat on the floor again.  Angel Wings: Lying on your back spread your legs to the side as far apart as you can without causing discomfort.  Hamstring Strength:  Lying on your back, push your heel against the floor with your leg straight by tightening up the muscles of your buttocks.  Repeat, but this time bend your knee to a comfortable angle, and push your heel against the floor.  You may put a pillow under the heel to make it more comfortable if necessary.   A rehabilitation program following joint replacement surgery can speed recovery and prevent re-injury in the future due to weakened muscles. Contact your doctor or a physical therapist for more information on knee rehabilitation.    CONSTIPATION  Constipation is defined medically as fewer than three stools per week and severe constipation as less than one stool per week.  Even if you have a regular bowel pattern at home, your normal regimen is likely to be disrupted due to multiple reasons following surgery.  Combination of anesthesia, postoperative narcotics,  change in appetite and fluid intake all can affect your bowels.   YOU MUST use at least one of the following options; they are listed in order of increasing strength to get the job done.  They are all available over the counter, and you may need to use some, POSSIBLY even all of these options:    Drink plenty of fluids (prune juice may be helpful) and high fiber foods Colace 100 mg by mouth twice a day  Senokot for constipation as directed and as needed Dulcolax (bisacodyl), take with full glass of water  Miralax (polyethylene glycol) once or twice a day as needed.  If you have tried all these things and are unable to have a bowel movement in the first 3-4 days after surgery call either your surgeon or your primary doctor.    If you experience loose stools or diarrhea, hold the medications until you stool forms back up.  If your symptoms do not get better within 1 week or if they get worse, check with your doctor.  If you experience "the worst abdominal pain ever" or develop nausea or vomiting, please contact the office immediately for further recommendations for treatment.   ITCHING:  If you experience itching with your medications, try taking only a single pain pill, or even half a pain pill at a time.  You can also use Benadryl over the counter for itching or also to help with sleep.   TED HOSE STOCKINGS:  Use stockings on both legs until for at least 2 weeks or as directed by physician office. They may be removed at night for sleeping.  MEDICATIONS:  See your medication summary on the "After Visit Summary" that nursing will review with you.  You may have some home medications which will be placed on hold until you complete the course of blood thinner medication.  It is important for you to complete the blood thinner medication as prescribed.  Take medicines as prescribed.   You have several different medicines that work in different ways. - Tylenol is for mild to moderate pain. Try to take  this medicine before turning to your narcotic medicines.  - Celebrex is to reduce pain / inflammation - Robaxin is for muscle spasms. This medicine can make you drowsy. - Oxycodone is a narcotic pain medicine.  Take this for severe pain. This medicine can be dehydrating / constipating. - Zofran is for nausea and vomiting. -  Miralax for constipation prevention - Aspirin and Brilinta is to prevent blood clots after surgery.   PRECAUTIONS:  If you experience chest pain or shortness of breath - call 911 immediately for transfer to the hospital emergency department.   If you develop a fever greater that 101 F, purulent drainage from wound, increased redness or drainage from wound, foul odor from the wound/dressing, or calf pain - CONTACT YOUR SURGEON.                                                   FOLLOW-UP APPOINTMENTS:  If you do not already have a post-op appointment, please call the office [phone] for an appointment to be seen by Dr. Eulah Pont in 2 weeks.   OTHER INSTRUCTIONS:   MAKE SURE YOU:  Understand these instructions.  Get help right away if you are not doing well or get worse.    Thank you for letting us be a part of your medical care team.  It is a privilege we respect greatly.  We hope these instructions will help you stay on track for a fast and full recovery!   Do not put a pillow under the knee. Place it under the heel.   Complete by: As directed    Driving restrictions   Complete by: As directed    No driving for 2-4 weeks   Post-operative opioid taper instructions:   Complete by: As directed    POST-OPERATIVE OPIOID TAPER INSTRUCTIONS: It is important to wean off of your opioid medication as soon as possible. If you do not need pain medication after your surgery it is ok to stop day one. Opioids include: Codeine, Hydrocodone(Norco, Vicodin), Oxycodone(Percocet, oxycontin) and hydromorphone amongst others.  Long term and even short term use of opiods can  cause: Increased pain response Dependence Constipation Depression Respiratory depression And more.  Withdrawal symptoms can include Flu like symptoms Nausea, vomiting And more Techniques to manage these symptoms Hydrate well Eat regular healthy meals Stay active Use relaxation techniques(deep breathing, meditating, yoga) Do Not substitute Alcohol to help with tapering If you have been on opioids for less than two weeks and do not have pain than it is ok to stop all together.  Plan to wean off of opioids This plan should start within one week post op of your joint replacement. Maintain the same interval or time between taking each dose and first decrease the dose.  Cut the total daily intake of opioids by one tablet each day Next start to increase the time between doses. The last dose that should be eliminated is the evening dose.      TED hose   Complete by: As directed    Use stockings (TED hose) for 2 weeks on left leg(s).  You may remove them at night for sleeping.   Weight bearing as tolerated   Complete by: As directed       Allergies as of 06/03/2022       Reactions   Beta Adrenergic Blockers    Metoprolol stopped 07/2018 due to 2nd degree type 2 AV block.        Medication List     TAKE these medications    Accu-Chek Aviva Plus test strip Generic drug: glucose blood USE STRIP TO CHECK GLUCOSE TWICE DAILY TO THREE TIMES DAILY **NEED OFFICE VISIT FOR REFILLS**  acetaminophen 500 MG tablet Commonly known as: TYLENOL Take 2 tablets (1,000 mg total) by mouth every 6 (six) hours as needed for mild pain or moderate pain.   aspirin 81 MG tablet Take 81 mg by mouth daily.   BD ULTRA-FINE PEN NEEDLES 29G X 12.7MM Misc Generic drug: Insulin Pen Needle 1 Device by Other route in the morning, at noon, in the evening, and at bedtime.   celecoxib 200 MG capsule Commonly known as: CELEBREX Take 200 mg by mouth 2 (two) times daily as needed for mild pain.    ciprofloxacin 0.3 % ophthalmic solution Commonly known as: CILOXAN Place 1 drop into both eyes See admin instructions. 1 DROP INTO EACH EYE 4 TIMES DAILY FOR 2 DAYS AFTER EACH  EYE INJECTION   doxycycline 100 MG tablet Commonly known as: VIBRA-TABS Take 1 tablet (100 mg total) by mouth 2 (two) times daily.   FreeStyle Libre 2 Sensor Misc 1 Device by Does not apply route every 14 (fourteen) days.   Insulin Aspart FlexPen 100 UNIT/ML Commonly known as: NOVOLOG Inject 30 Units into the skin 2 (two) times daily with a meal.   Lantus SoloStar 100 UNIT/ML Solostar Pen Generic drug: insulin glargine Inject 24 Units into the skin at bedtime.   methocarbamol 750 MG tablet Commonly known as: Robaxin-750 Take 1 tablet (750 mg total) by mouth every 8 (eight) hours as needed for muscle spasms.   nitroGLYCERIN 0.4 MG/SPRAY spray Commonly known as: NITROLINGUAL Place 1 spray under the tongue every 5 (five) minutes x 3 doses as needed for chest pain.   ondansetron 4 MG disintegrating tablet Commonly known as: ZOFRAN-ODT Take 1 tablet (4 mg total) by mouth 2 (two) times daily as needed for nausea or vomiting.   Oxycodone HCl 10 MG Tabs Take 0.5-1 tablets (5-10 mg total) by mouth every 8 (eight) hours as needed (for severe pain after surgery).   pantoprazole 40 MG tablet Commonly known as: PROTONIX Take 1 tablet (40 mg total) by mouth daily.   polyethylene glycol 17 g packet Commonly known as: MiraLax Take 17 g by mouth daily. to prevent constipation   rosuvastatin 10 MG tablet Commonly known as: CRESTOR Take 10 mg by mouth daily.   ticagrelor 60 MG Tabs tablet Commonly known as: BRILINTA Take 60 mg by mouth 2 (two) times daily.   valsartan 320 MG tablet Commonly known as: DIOVAN Take 1 tablet (320 mg total) by mouth daily.   Victoza 18 MG/3ML Sopn Generic drug: liraglutide Inject 1.8 mg into the skin daily.               Discharge Care Instructions  (From  admission, onward)           Start     Ordered   06/03/22 0000  Weight bearing as tolerated        06/03/22 1442            Follow-up Information     Sheral Apley, MD. Go on 06/17/2022.   Specialty: Orthopedic Surgery Why: Your appointment is at 4:15. Contact information: [address] [place] [address] [phone]         [employer], [place]. Go on 06/04/2022.   Why: Your outpatient physical therapy appointment is at 10:00. Please arrive at 9:45 to complete your paperwork Contact information: [facility] [address] [place] [address] [phone]  Signed: Jenne Pane PA-C 06/03/2022, 2:42 PM

## 2022-06-03 NOTE — TOC Transition Note (Signed)
Transition of Care [facility]) - CM/SW Discharge Note  Patient Details  Name: Erik Hancock MRN: [id number] Date of Birth: 1951-04-08  Transition of Care [facility]) CM/SW Contact:  Ewing Schlein, LCSW Phone Number: 06/03/2022, 11:16 AM  Clinical Narrative: Patient is expected to discharge home after working with PT. CSW spoke with patient to confirm discharge plan. Patient will go home with OPPT at [facility] [employer] set up a rolling walker and CPM prior to surgery, so there are no current DME needs. TOC signing off.  Final next level of care: OP Rehab Barriers to Discharge: No Barriers Identified  Patient Goals and CMS Choice Patient states their goals for this hospitalization and ongoing recovery are:: Discharge home with OPPT at [facility]. Choice offered to / list presented to : NA  Discharge Plan and Services       DME Arranged: CPM, Walker rolling DME Agency: [employer]  Readmission Risk Interventions     No data to display

## 2022-06-03 NOTE — Progress Notes (Signed)
    Subjective: Patient reports pain as mild. Medicines helping. Tolerating diet.  Urinating.   No CP, SOB.  Has not been able to mobilize OOB with PT yet. Has used the CPM. Hopeful to go home later today.   Objective:   VITALS:   Vitals:   06/02/22 2144 06/03/22 0320 06/03/22 0622 06/03/22 0935  BP: 127/75 120/66 (!) 153/72 (!) 149/79  Pulse: 86 67 79 81  Resp: 18 18 18 15   Temp: 98.2 F (36.8 C) 97.8 F (36.6 C) 98.2 F (36.8 C) 98.1 F (36.7 C)  TempSrc: Oral Oral Oral Oral  SpO2: 94% 96% 98% 97%  Weight:      Height:          Latest Ref Rng & Units 05/21/2022   11:30 AM 03/04/2022    3:42 AM 02/17/2022    1:31 AM  CBC  WBC 4.0 - 10.5 K/uL 9.2  7.4  10.1   Hemoglobin 13.0 - 17.0 g/dL 02/19/2022  16.3  84.6   Hematocrit 39.0 - 52.0 % 42.9  39.4  33.9   Platelets 150 - 400 K/uL 334  355  296       Latest Ref Rng & Units 05/21/2022   11:30 AM 03/04/2022    3:42 AM 02/17/2022    1:31 AM  BMP  Glucose 70 - 99 mg/dL 82  02/19/2022  935   BUN 8 - 23 mg/dL 13  11  12    Creatinine 0.61 - 1.24 mg/dL 701   7.79   BUN/Creat Ratio 6 - 22 (calc)  NOT APPLICABLE    Sodium 135 - 145 mmol/L 144  141  135   Potassium 3.5 - 5.1 mmol/L 3.8  3.8  3.6   Chloride 98 - 111 mmol/L 111  104  102   CO2 22 - 32 mmol/L 25  25  25    Calcium 8.9 - 10.3 mg/dL 9.1  9.1  8.2    Intake/Output      07/11 0701 07/12 0700 07/12 0701 07/13 0700   I.V. (mL/kg) 500 (4.1)    IV Piggyback 300    Total Intake(mL/kg) 800 (6.6)    Urine (mL/kg/hr) 2200    Blood 25    Total Output 2225    Net -1425            Physical Exam: General: NAD.  Sitting up in bed finishing breakfast, calm, comfortable Resp: No increased wob Cardio: regular rate and rhythm ABD soft Neurologically intact MSK Neurovascularly intact Sensation intact distally Intact pulses distally Dorsiflexion/Plantar flexion intact Incision: dressing C/D/I   Assessment: 1 Day Post-Op  S/P Procedure(s) (LRB): UNICOMPARTMENTAL KNEE  (Left) by Dr. 09/12. Murphy on 06/02/22  Principal Problem:   S/P left unicompartmental knee replacement   Plan:  Advance diet Up with therapy Incentive Spirometry Elevate and Apply ice  Weightbearing: WBAT LLE Insicional and dressing care: Dressings left intact until follow-up and Reinforce dressings as needed Orthopedic device(s):  CPM and zero degree bone foam Showering: Keep dressing dry VTE prophylaxis: Aspirin 81mg  qd  and Brilinta as normal , SCDs, ambulation Pain control: continue current plan Follow - up plan: 2 weeks Contact information:  [address] MD, Jewel Baize PA-C  Dispo: Home later today if passes PT evaluation.      08/03/22, PA-C Office [phone] 06/03/2022, 10:09 AM

## 2022-06-04 ENCOUNTER — Encounter (HOSPITAL_COMMUNITY): Payer: Self-pay | Admitting: Orthopedic Surgery

## 2022-06-04 DIAGNOSIS — Z96652 Presence of left artificial knee joint: Secondary | ICD-10-CM | POA: Diagnosis not present

## 2022-06-04 DIAGNOSIS — M25662 Stiffness of left knee, not elsewhere classified: Secondary | ICD-10-CM | POA: Diagnosis not present

## 2022-06-04 DIAGNOSIS — M1712 Unilateral primary osteoarthritis, left knee: Secondary | ICD-10-CM | POA: Diagnosis not present

## 2022-06-04 DIAGNOSIS — M6281 Muscle weakness (generalized): Secondary | ICD-10-CM | POA: Diagnosis not present

## 2022-06-04 DIAGNOSIS — R262 Difficulty in walking, not elsewhere classified: Secondary | ICD-10-CM | POA: Diagnosis not present

## 2022-06-10 DIAGNOSIS — Z96652 Presence of left artificial knee joint: Secondary | ICD-10-CM | POA: Diagnosis not present

## 2022-06-10 DIAGNOSIS — M25662 Stiffness of left knee, not elsewhere classified: Secondary | ICD-10-CM | POA: Diagnosis not present

## 2022-06-10 DIAGNOSIS — R262 Difficulty in walking, not elsewhere classified: Secondary | ICD-10-CM | POA: Diagnosis not present

## 2022-06-10 DIAGNOSIS — M1712 Unilateral primary osteoarthritis, left knee: Secondary | ICD-10-CM | POA: Diagnosis not present

## 2022-06-10 DIAGNOSIS — M6281 Muscle weakness (generalized): Secondary | ICD-10-CM | POA: Diagnosis not present

## 2022-06-12 DIAGNOSIS — M1712 Unilateral primary osteoarthritis, left knee: Secondary | ICD-10-CM | POA: Diagnosis not present

## 2022-06-12 DIAGNOSIS — M6281 Muscle weakness (generalized): Secondary | ICD-10-CM | POA: Diagnosis not present

## 2022-06-12 DIAGNOSIS — M25662 Stiffness of left knee, not elsewhere classified: Secondary | ICD-10-CM | POA: Diagnosis not present

## 2022-06-12 DIAGNOSIS — Z96652 Presence of left artificial knee joint: Secondary | ICD-10-CM | POA: Diagnosis not present

## 2022-06-12 DIAGNOSIS — R262 Difficulty in walking, not elsewhere classified: Secondary | ICD-10-CM | POA: Diagnosis not present

## 2022-06-16 ENCOUNTER — Other Ambulatory Visit: Payer: Self-pay

## 2022-06-16 DIAGNOSIS — Z794 Long term (current) use of insulin: Secondary | ICD-10-CM

## 2022-06-16 MED ORDER — LANTUS SOLOSTAR 100 UNIT/ML ~~LOC~~ SOPN: 24.0000 [IU] | PEN_INJECTOR | Freq: Every day | SUBCUTANEOUS | 2 refills | Status: DC

## 2022-06-16 MED ORDER — LANTUS SOLOSTAR 100 UNIT/ML ~~LOC~~ SOPN: 24.0000 [IU] | PEN_INJECTOR | Freq: Every day | SUBCUTANEOUS | 0 refills | Status: DC

## 2022-06-16 NOTE — Addendum Note (Signed)
Addended by: Kenyon Ana on: 06/16/2022 12:55 PM   Modules accepted: Orders

## 2022-06-17 DIAGNOSIS — R262 Difficulty in walking, not elsewhere classified: Secondary | ICD-10-CM | POA: Diagnosis not present

## 2022-06-17 DIAGNOSIS — M1712 Unilateral primary osteoarthritis, left knee: Secondary | ICD-10-CM | POA: Diagnosis not present

## 2022-06-17 DIAGNOSIS — Z96652 Presence of left artificial knee joint: Secondary | ICD-10-CM | POA: Diagnosis not present

## 2022-06-17 DIAGNOSIS — M6281 Muscle weakness (generalized): Secondary | ICD-10-CM | POA: Diagnosis not present

## 2022-06-17 DIAGNOSIS — M25662 Stiffness of left knee, not elsewhere classified: Secondary | ICD-10-CM | POA: Diagnosis not present

## 2022-06-19 ENCOUNTER — Ambulatory Visit: Payer: Medicare PPO | Admitting: Internal Medicine

## 2022-06-19 ENCOUNTER — Encounter: Payer: Self-pay | Admitting: Internal Medicine

## 2022-06-19 VITALS — BP 130/76 | HR 72 | Ht 69.0 in | Wt 256.4 lb

## 2022-06-19 DIAGNOSIS — Z794 Long term (current) use of insulin: Secondary | ICD-10-CM | POA: Diagnosis not present

## 2022-06-19 DIAGNOSIS — E1165 Type 2 diabetes mellitus with hyperglycemia: Secondary | ICD-10-CM | POA: Diagnosis not present

## 2022-06-19 DIAGNOSIS — N1831 Chronic kidney disease, stage 3a: Secondary | ICD-10-CM | POA: Diagnosis not present

## 2022-06-19 DIAGNOSIS — E1122 Type 2 diabetes mellitus with diabetic chronic kidney disease: Secondary | ICD-10-CM

## 2022-06-19 DIAGNOSIS — E785 Hyperlipidemia, unspecified: Secondary | ICD-10-CM

## 2022-06-19 DIAGNOSIS — E1159 Type 2 diabetes mellitus with other circulatory complications: Secondary | ICD-10-CM

## 2022-06-19 MED ORDER — SEMAGLUTIDE(0.25 OR 0.5MG/DOS) 2 MG/3ML ~~LOC~~ SOPN: PEN_INJECTOR | SUBCUTANEOUS | 3 refills | Status: DC

## 2022-06-19 MED ORDER — INSULIN PEN NEEDLE 32G X 4 MM MISC: 3 refills | Status: AC

## 2022-06-19 MED ORDER — LANTUS SOLOSTAR 100 UNIT/ML ~~LOC~~ SOPN: 24.0000 [IU] | PEN_INJECTOR | Freq: Every day | SUBCUTANEOUS | 3 refills | Status: DC

## 2022-06-19 NOTE — Patient Instructions (Addendum)
STOP SWEET DRINKS!  Please continue: - Latus 24 units at bedtime  Change: - Novolog to 20-30 units 2x daily 15 min before the meals  Stop Victoza and start: - Ozempic 0.5 mg weekly  Please return in 3-4 months.  PATIENT INSTRUCTIONS FOR TYPE 2 DIABETES:  **Please join MyChart!** - see attached instructions about how to join if you have not done so already.  DIET AND EXERCISE Diet and exercise is an important part of diabetic treatment.  We recommended aerobic exercise in the form of brisk walking (working between 40-60% of maximal aerobic capacity, similar to brisk walking) for 150 minutes per week (such as 30 minutes five days per week) along with 3 times per week performing 'resistance' training (using various gauge rubber tubes with handles) 5-10 exercises involving the major muscle groups (upper body, lower body and core) performing 10-15 repetitions (or near fatigue) each exercise. Start at half the above goal but build slowly to reach the above goals. If limited by weight, joint pain, or disability, we recommend daily walking in a swimming pool with water up to waist to reduce pressure from joints while allow for adequate exercise.    BLOOD GLUCOSES Monitoring your blood glucoses is important for continued management of your diabetes. Please check your blood glucoses 2-4 times a day: fasting, before meals and at bedtime (you can rotate these measurements - e.g. one day check before the 3 meals, the next day check before 2 of the meals and before bedtime, etc.).   HYPOGLYCEMIA (low blood sugar) Hypoglycemia is usually a reaction to not eating, exercising, or taking too much insulin/ other diabetes drugs.  Symptoms include tremors, sweating, hunger, confusion, headache, etc. Treat IMMEDIATELY with 15 grams of Carbs: 4 glucose tablets  cup regular juice/soda 2 tablespoons raisins 4 teaspoons sugar 1 tablespoon honey Recheck blood glucose in 15 mins and repeat above if still  symptomatic/blood glucose <100.  RECOMMENDATIONS TO REDUCE YOUR RISK OF DIABETIC COMPLICATIONS: * Take your prescribed MEDICATION(S) * Follow a DIABETIC diet: Complex carbs, fiber rich foods, (monounsaturated and polyunsaturated) fats * AVOID saturated/trans fats, high fat foods, >2,300 mg salt per day. * EXERCISE at least 5 times a week for 30 minutes or preferably daily.  * DO NOT SMOKE OR DRINK more than 1 drink a day. * Check your FEET every day. Do not wear tightfitting shoes. Contact us if you develop an ulcer * See your EYE doctor once a year or more if needed * Get a FLU shot once a year * Get a PNEUMONIA vaccine once before and once after age 96 years  GOALS:  * Your Hemoglobin A1c of <7%  * fasting sugars need to be <130 * after meals sugars need to be <180 (2h after you start eating) * Your Systolic BP should be 140 or lower  * Your Diastolic BP should be 80 or lower  * Your HDL (Good Cholesterol) should be 40 or higher  * Your LDL (Bad Cholesterol) should be 100 or lower. * Your Triglycerides should be 150 or lower  * Your Urine microalbumin (kidney function) should be <30 * Your Body Mass Index should be 25 or lower    Please consider the following ways to cut down carbs and fat and increase fiber and micronutrients in your diet: - substitute whole grain for white bread or pasta - substitute brown rice for white rice - substitute 90-calorie flat bread pieces for slices of bread when possible - substitute sweet potatoes or  yams for white potatoes - substitute humus for margarine - substitute tofu for cheese when possible - substitute almond or rice milk for regular milk (would not drink soy milk daily due to concern for soy estrogen influence on breast cancer risk) - substitute dark chocolate for other sweets when possible - substitute water - can add lemon or orange slices for taste - for diet sodas (artificial sweeteners will trick your body that you can eat sweets  without getting calories and will lead you to overeating and weight gain in the long run) - do not skip breakfast or other meals (this will slow down the metabolism and will result in more weight gain over time)  - can try smoothies made from fruit and almond/rice milk in am instead of regular breakfast - can also try old-fashioned (not instant) oatmeal made with almond/rice milk in am - order the dressing on the side when eating salad at a restaurant (pour less than half of the dressing on the salad) - eat as little meat as possible - can try juicing, but should not forget that juicing will get rid of the fiber, so would alternate with eating raw veg./fruits or drinking smoothies - use as little oil as possible, even when using olive oil - can dress a salad with a mix of balsamic vinegar and lemon juice, for e.g. - use agave nectar, stevia sugar, or regular sugar rather than artificial sweateners - steam or broil/roast veggies  - snack on veggies/fruit/nuts (unsalted, preferably) when possible, rather than processed foods - reduce or eliminate aspartame in diet (it is in diet sodas, chewing gum, etc) Read the labels!  Try to read Dr. Katherina Right book: "Program for Reversing Diabetes" for other ideas for healthy eating.

## 2022-06-19 NOTE — Progress Notes (Signed)
Patient ID: MELO OSMANI, male   DOB: Sep 22, 1951, 71 y.o.   MRN: [id number]  HPI: Erik Hancock is a 71 y.o.-year-old male, returning for follow-up for DM2, dx in 2002, insulin-dependent since 2010, uncontrolled, with complications (CAD - s/p PTCA, CKD stage IIIa, DR). Pt. previously saw Dr. Everardo All, last visit 3 months ago. He is here with his wife who offers part of the history, especially regarding his medications, past medical history, diet, and activity.  He recently had a left TKR on 06/02/2022.  He is feeling well after the surgery.  He was not able to move around so much and also did not not go to the kitchen to eat as frequently.  He lost weight and sugars improved.  Reviewed HbA1c: Lab Results  Component Value Date   HGBA1C 6.6 (H) 05/21/2022   HGBA1C 7.3 (A) 03/30/2022   HGBA1C 8.4 (H) 02/11/2022   HGBA1C 8.0 (A) 08/21/2021   HGBA1C 8.8 (A) 05/20/2021   HGBA1C 9.7 (A) 01/28/2021   HGBA1C 7.6 (A) 06/17/2020   HGBA1C 7.7 (A) 03/11/2020   HGBA1C 8.0 (A) 01/10/2020   HGBA1C 7.4 (A) 10/18/2019   Pt is on a regimen of: - Victoza 1.8 mg before breakfast - Lantus 24 units at bedtime - Novolog 30 units 2x a day, after breakfast and at bedtime! He was taken off Jardiance in the hospital in 01/2022. He tried Metformin >> was stopped.  Pt. Tried a Libre CGM >> kept coming off.  Wife is planning to buy securing tape to put on top of them.  Pt checks his sugars more than 0-1x a day (not recently): - am: 60s, 108-233 - 2h after b'fast: n/c - before lunch: n/c - 2h after lunch: n/c - before dinner: n/c - 2h after dinner: n/c - bedtime: n/c - nighttime: n/c Lowest sugar was 60s; he has hypoglycemia awareness at 70.  Highest sugar was 200s.  Glucometer:AccuChek  - + CKD stage 3a, last BUN/creatinine:  Lab Results  Component Value Date   BUN 13 05/21/2022   BUN 11 03/04/2022   CREATININE 0.99 05/21/2022   CREATININE 1.05 03/04/2022  He is on Diovan 160 mg daily.  -+ HL;  last set of lipids: Lab Results  Component Value Date   CHOL 134 06/26/2021   HDL 47 06/26/2021   LDLCALC 71 06/26/2021   TRIG 84 06/26/2021   CHOLHDL 2.9 06/26/2021  On Crestor 10 mg daily.  - last eye exam was on 08/07/2021. + DR reportedly.  Available records are from 2019: + DR at that time. Dr. Ashley Royalty. IO injections.  - no numbness and tingling in his feet.  Last foot exam 08/21/2021.  He also has a history of HTN, kidney stones, GERD.  ROS: + see HPI No increased urination, blurry vision, nausea, chest pain.  Past Medical History:  Diagnosis Date   Arthritis    "all over" (07/29/2018)   CAD S/P percutaneous coronary angioplasty    a. stent to mLAD 11/2001. b. DES to PDA/mLCx 2007. c. DES to ostial LAD 07/2018.   Chronic lower back pain    CKD (chronic kidney disease), stage III (HCC)    CKD (chronic kidney disease), stage III (HCC)    Diabetic retinopathy (HCC)    Diabetic retinopathy (HCC)    GERD (gastroesophageal reflux disease)    High cholesterol    History of kidney stones    Hypertension    Mobitz type 2 second degree atrioventricular block    a. noted  during 07/2018 adm, metoprolol discontinued.   Morbid obesity (HCC) 07/28/2018   Prostatitis    Pulmonary nodule 2013   CT   Sleep apnea    stopbang=5   Suspected sleep apnea    Type II diabetes mellitus (HCC)    Past Surgical History:  Procedure Laterality Date   CARDIAC CATHETERIZATION  JUNE 2003   PATENT LAD STENT/ BODERLINE OBSTRUCTIVE DISEASE POSTERIOR DESCENDING ARTERY/ NORMAL LVF   CATARACT EXTRACTION W/ INTRAOCULAR LENS  IMPLANT, BILATERAL Bilateral 2017   CERVICAL SCOVILLE FORAMINOTOMY W/ EXCISION OF HERNIATED NUCLEC PULPOSUS  2009   C6 - 7   CORONARY ANGIOPLASTY WITH STENT PLACEMENT  03/30/2006   DR EDMUNDS - 90% LCx@OM2  TAXUS  DES 3.0 X 20 -> 3.25 mm,   95% RPDA -- TAXUS DES 3.0 X 16 --> 3.5 mm   CORONARY ANGIOPLASTY WITH STENT PLACEMENT  11/2001   (Dr. Ty Hilts for Dr. Mayford Knife) - mLAD@D2  - TAXUS  EXPRESS DES 3.5 x 15    CORONARY ANGIOPLASTY WITH STENT PLACEMENT  07/29/2018   CORONARY STENT INTERVENTION N/A 07/29/2018   Procedure: CORONARY STENT INTERVENTION;  Surgeon: Marykay Lex, MD;  Location: [facility];  Service: Cardiovascular;  Laterality: N/A;   CYSTOSCOPY W/ RETROGRADES  05/04/2012   Procedure: CYSTOSCOPY WITH RETROGRADE PYELOGRAM;  Surgeon: Milford Cage, MD;  Location: [facility];  Service: Urology;  Laterality: Left;   CYSTOSCOPY W/ URETERAL STENT PLACEMENT  05/04/2012   Procedure: CYSTOSCOPY WITH STENT REPLACEMENT;  Surgeon: Milford Cage, MD;  Location: [facility];  Service: Urology;  Laterality: Left;   CYSTOSCOPY WITH STENT PLACEMENT Left 04/04/2012   CYSTOSCOPY WITH URETEROSCOPY  05/04/2012   Procedure: CYSTOSCOPY WITH URETEROSCOPY;  Surgeon: Milford Cage, MD;  Location: [facility];  Service: Urology;  Laterality: Left;       I & D EXTREMITY Right 02/11/2022   Procedure: IRRIGATION AND DEBRIDEMENT  OF HAND AND FOREARM;  Surgeon: Bradly Bienenstock, MD;  Location: [facility];  Service: Orthopedics;  Laterality: Right;   LEFT HEART CATH AND CORONARY ANGIOGRAPHY N/A 07/29/2018   Procedure: LEFT HEART CATH AND CORONARY ANGIOGRAPHY;  Surgeon: Marykay Lex, MD;  Location: [facility];  Service: Cardiovascular;  Laterality: N/A;   LEFT URETEROSCOPIC STONE EXTRACTION  03-14-2003   X2   PARTIAL KNEE ARTHROPLASTY Left 06/02/2022   Procedure: UNICOMPARTMENTAL KNEE;  Surgeon: Sheral Apley, MD;  Location: [facility];  Service: Orthopedics;  Laterality: Left;   Social History   Socioeconomic History   Marital status: Married    Spouse name: Not on file   Number of children: Not on file   Years of education: Not on file   Highest education level: Not on file  Occupational History   Not on file  Tobacco Use   Smoking status: Never   Smokeless tobacco: Never  Vaping Use   Vaping Use: Never used  Substance and Sexual Activity   Alcohol use: Never   Drug  use: Never   Sexual activity: Not Currently  Other Topics Concern   Not on file  Social History Narrative   Right handed    Lives with wife    Social Determinants of Health   Financial Resource Strain: Not on file  Food Insecurity: Not on file  Transportation Needs: Not on file  Physical Activity: Not on file  Stress: Not on file  Social Connections: Not on file  Intimate Partner Violence: Not on file   Current Outpatient Medications on File  Prior to Visit  Medication Sig Dispense Refill   ACCU-CHEK AVIVA PLUS test strip USE STRIP TO CHECK GLUCOSE TWICE DAILY TO THREE TIMES DAILY **NEED OFFICE VISIT FOR REFILLS** 100 each 0   acetaminophen (TYLENOL) 500 MG tablet Take 2 tablets (1,000 mg total) by mouth every 6 (six) hours as needed for mild pain or moderate pain. 60 tablet 0   aspirin 81 MG tablet Take 81 mg by mouth daily.     celecoxib (CELEBREX) 200 MG capsule Take 200 mg by mouth 2 (two) times daily as needed for mild pain.     ciprofloxacin (CILOXAN) 0.3 % ophthalmic solution Place 1 drop into both eyes See admin instructions. 1 DROP INTO EACH EYE 4 TIMES DAILY FOR 2 DAYS AFTER EACH  EYE INJECTION     Continuous Blood Gluc Sensor (FREESTYLE LIBRE 2 SENSOR) MISC 1 Device by Does not apply route every 14 (fourteen) days. 6 each 3   doxycycline (VIBRA-TABS) 100 MG tablet Take 1 tablet (100 mg total) by mouth 2 (two) times daily. 20 tablet 0   Insulin Aspart FlexPen 100 UNIT/ML SOPN Inject 30 Units into the skin 2 (two) times daily with a meal. 60 mL 3   insulin glargine (LANTUS SOLOSTAR) 100 UNIT/ML Solostar Pen Inject 24 Units into the skin at bedtime. 15 mL 0   Insulin Pen Needle (BD ULTRA-FINE PEN NEEDLES) 29G X 12.7MM MISC 1 Device by Other route in the morning, at noon, in the evening, and at bedtime. 360 each 3   liraglutide (VICTOZA) 18 MG/3ML SOPN Inject 1.8 mg into the skin daily. 18 mL 3   methocarbamol (ROBAXIN-750) 750 MG tablet Take 1 tablet (750 mg total) by mouth  every 8 (eight) hours as needed for muscle spasms. 20 tablet 0   nitroGLYCERIN (NITROLINGUAL) 0.4 MG/SPRAY spray Place 1 spray under the tongue every 5 (five) minutes x 3 doses as needed for chest pain. 12 g 12   ondansetron (ZOFRAN-ODT) 4 MG disintegrating tablet Take 1 tablet (4 mg total) by mouth 2 (two) times daily as needed for nausea or vomiting. 10 tablet 0   Oxycodone HCl 10 MG TABS Take 0.5-1 tablets (5-10 mg total) by mouth every 8 (eight) hours as needed (for severe pain after surgery). 21 tablet 0   pantoprazole (PROTONIX) 40 MG tablet Take 1 tablet (40 mg total) by mouth daily. 90 tablet 3   polyethylene glycol (MIRALAX) 17 g packet Take 17 g by mouth daily. to prevent constipation 14 each 0   rosuvastatin (CRESTOR) 10 MG tablet Take 10 mg by mouth daily.     ticagrelor (BRILINTA) 60 MG TABS tablet Take 60 mg by mouth 2 (two) times daily.     valsartan (DIOVAN) 320 MG tablet Take 1 tablet (320 mg total) by mouth daily. 90 tablet 3   No current facility-administered medications on file prior to visit.   Allergies  Allergen Reactions   Beta Adrenergic Blockers     Metoprolol stopped 07/2018 due to 2nd degree type 2 AV block.   Family History  Problem Relation Age of Onset   Hypertension Mother    Heart attack Father    Heart disease Father    Alzheimer's disease Father    Diabetes Father    Heart attack Paternal Grandfather    Heart disease Paternal Grandfather    Lupus Sister    PE: BP 130/76 (BP Location: Left Arm, Patient Position: Sitting, Cuff Size: Normal)   Pulse 72   Ht 5'  9" (1.753 m)   Wt 256 lb 6.4 oz (116.3 kg)   SpO2 96%   BMI 37.86 kg/m  Wt Readings from Last 3 Encounters:  06/19/22 256 lb 6.4 oz (116.3 kg)  06/02/22 268 lb (121.6 kg)  05/21/22 262 lb (118.8 kg)   Constitutional: overweight, in NAD Eyes: no exophthalmos ENT: moist mucous membranes, no thyromegaly, no cervical lymphadenopathy Cardiovascular: RRR, No MRG Respiratory: CTA  B Musculoskeletal: no deformities Skin: moist, warm, no rashes, TKR scar healing Neurological: no tremor with outstretched hands  ASSESSMENT: 1. DM2, insulin-dependent, uncontrolled, with complications - CAD, s/p PTCA - CKD stage 3a - DR  2. HL  PLAN:  1. Patient with long-standing, uncontrolled diabetes, on oral antidiabetic regimen, with improved control at last check -HbA1c last month returned 6.6%, the best in a long time. - He is on a basal/bolus insulin regimen and also daily GLP-1 receptor agonist. - At today's visit, he mentions that he had not checked his blood sugars recently, but when he was checking, they were fluctuating in the morning between 60s and 200s.  He was not checking later in the day.  He tried a CGM but this kept coming off.  They are looking to buy an adhesive patch to put on top of the sensor and restart.  I strongly encouraged him to do so. -Upon questioning, his Lantus dose was increased recently but he is using a fixed dose of NovoLog, 30 units twice a day, for both of his meals, but he takes the insulin without the relationship to the meal.  In fact, he takes a second dose at bedtime.  I advised him that this is dangerous and this is most likely the reason for his fluctuating blood sugars in the morning.  I advised him to move NovoLog 15 minutes before both meals.  For now, without further data, we will keep the dose of Lantus the same but I did advise him to vary the dose of NovoLog based on the size and consistency of his meals. -Reviewing his medication list, he uses a very long pen needles, of 12.5 mm.  I advised him that we do not use these anymore due to the risk of bleeding.  Indeed, he shows me bleeding spots on his undershirt. -He tells me that Marcelline Deist was stopped in the hospital in 01/2022.  I am not sure why this was stopped and we discussed about possibly restarting it, but wife is telling me that this was actually expensive and would prefer to stay  without it for now.  Victoza is also fairly expensive but we discussed about trying to change to Ozempic, which is increased injected once a week and has a stronger effect on his blood sugars.  They agree with the plan. -He is telling me that he drinks orange juice with breakfast and I advised him to stop drinking sweet drinks. - I suggested to:  Patient Instructions  STOP SWEET DRINKS!  Please continue: - Lantus 24 units at bedtime  Change: - Novolog to 20-30 units 2x daily 15 min before the meals  Stop Victoza and start: - Ozempic 0.5 mg weekly  Please return in 3-4 months.  - check sugars at different times of the day - check 4x a day, rotating checks - discussed about CBG targets for treatment: 80-130 mg/dL before meals and <712 mg/dL after meals; target WPY0D <7%. - advised for yearly eye exams  - Return to clinic in 4 mo with sugar log  2. HL - Reviewed latest lipid panel from 06/2021: LDL above the target of less than 55 due to history of cardiovascular disease, the rest the fractions at goal: Lab Results  Component Value Date   CHOL 134 06/26/2021   HDL 47 06/26/2021   LDLCALC 71 06/26/2021   TRIG 84 06/26/2021   CHOLHDL 2.9 06/26/2021  - Continues Crestor 10 mg daily without side effects.  - Total time spent for the visit: 40 min, in precharting, reviewing Dr. Cordelia Pen last note, obtaining medical information from the chart and from the pt, reviewing his  previous labs, evaluations, and treatments, reviewing his symptoms, counseling him about his diabetes (please see the discussed topics above), and developing a plan to further treat it; he had a number of questions which I addressed   Philemon Kingdom, MD PhD [facility]

## 2022-06-22 DIAGNOSIS — Z96652 Presence of left artificial knee joint: Secondary | ICD-10-CM | POA: Diagnosis not present

## 2022-06-22 DIAGNOSIS — M25662 Stiffness of left knee, not elsewhere classified: Secondary | ICD-10-CM | POA: Diagnosis not present

## 2022-06-22 DIAGNOSIS — R262 Difficulty in walking, not elsewhere classified: Secondary | ICD-10-CM | POA: Diagnosis not present

## 2022-06-22 DIAGNOSIS — M6281 Muscle weakness (generalized): Secondary | ICD-10-CM | POA: Diagnosis not present

## 2022-06-22 DIAGNOSIS — M1712 Unilateral primary osteoarthritis, left knee: Secondary | ICD-10-CM | POA: Diagnosis not present

## 2022-07-02 DIAGNOSIS — L03011 Cellulitis of right finger: Secondary | ICD-10-CM | POA: Diagnosis not present

## 2022-07-02 DIAGNOSIS — M6281 Muscle weakness (generalized): Secondary | ICD-10-CM | POA: Diagnosis not present

## 2022-07-02 DIAGNOSIS — R262 Difficulty in walking, not elsewhere classified: Secondary | ICD-10-CM | POA: Diagnosis not present

## 2022-07-02 DIAGNOSIS — M25662 Stiffness of left knee, not elsewhere classified: Secondary | ICD-10-CM | POA: Diagnosis not present

## 2022-07-02 DIAGNOSIS — Z96652 Presence of left artificial knee joint: Secondary | ICD-10-CM | POA: Diagnosis not present

## 2022-07-02 DIAGNOSIS — M1712 Unilateral primary osteoarthritis, left knee: Secondary | ICD-10-CM | POA: Diagnosis not present

## 2022-07-07 ENCOUNTER — Encounter (HOSPITAL_BASED_OUTPATIENT_CLINIC_OR_DEPARTMENT_OTHER): Payer: Self-pay | Admitting: Cardiovascular Disease

## 2022-07-07 ENCOUNTER — Ambulatory Visit (HOSPITAL_BASED_OUTPATIENT_CLINIC_OR_DEPARTMENT_OTHER): Payer: Medicare PPO | Admitting: Cardiovascular Disease

## 2022-07-07 VITALS — BP 158/64 | HR 70 | Ht 67.0 in | Wt 270.0 lb

## 2022-07-07 DIAGNOSIS — Z79899 Other long term (current) drug therapy: Secondary | ICD-10-CM

## 2022-07-07 DIAGNOSIS — I10 Essential (primary) hypertension: Secondary | ICD-10-CM

## 2022-07-07 DIAGNOSIS — E785 Hyperlipidemia, unspecified: Secondary | ICD-10-CM | POA: Diagnosis not present

## 2022-07-07 DIAGNOSIS — I2510 Atherosclerotic heart disease of native coronary artery without angina pectoris: Secondary | ICD-10-CM

## 2022-07-07 MED ORDER — SPIRONOLACTONE 25 MG PO TABS: 25.0000 mg | ORAL_TABLET | Freq: Every day | ORAL | 3 refills | Status: DC

## 2022-07-07 NOTE — Assessment & Plan Note (Addendum)
Blood pressure is poorly controlled.  We noted that he has increased weight recently.  He has Ozempic on his med list but has not yet started it.  Encouraged him to go ahead and start it.  Also work on increasing exercise as above.  Continue valsartan and we will add spironolactone 25 mg daily.  He cannot be on a beta-blocker due to second-degree heart block.  I suspect that he will not tolerate thiazide diuretics given he has had hypokalemia in the past.  He also has some ankle edema on exam so I will not add a calcium channel blocker at this time.  HYPERTENSION CONTROL Vitals:   07/07/22 0911 07/07/22 0937  BP: (!) 156/84 (!) 158/64    The patient's blood pressure is elevated above target today.  In order to address the patient's elevated BP: A current anti-hypertensive medication was adjusted today.

## 2022-07-07 NOTE — Assessment & Plan Note (Addendum)
He has no angina.  Encouraged him to keep working on increasing his exercise.  Continue aspirin, ticagrelor, and rosuvastatin.  LDL goal is less than 70.  He is not on a beta-blocker due to history of second-degree heart block.

## 2022-07-07 NOTE — Progress Notes (Signed)
Cardiology Office Note   Date:  07/07/2022   ID:  Erik Hancock, DOB 01/13/1951, MRN [id number]  PCP:  [name], MD  Cardiologist:   Skeet Latch, MD   No chief complaint on file.   History of Present Illness: Erik Hancock is a 71 y.o. male with CAD s/p PCI, mild descending aortic aneurysm, OSA on CPAP, diabetes, hypertension and hyperlipidemia here for follow-up.  He initially presented to establish care 07/2018.  At that appointment he reported increasing exertional dyspnea.  Symptoms were progressive over the preceding year.  He had no chest pain but did get discomfort in his left arm.  This was similar to when he previously required coronary stenting.  In 2012 Erik Hancock had angina and underwent PCI of the LAD.  He had no other CAD.   He had an echo 01/2014 that revealed LVEF 60-65% and an moderately dilated RV.  He was referred for left heart catheterization.  At that time he was found to have three-vessel CAD with stents in LAD, left circumflex, and PDA.  He was noted to have a 95% ostial LAD lesion that was successfully treated with scoring balloon angioplasty and implantation of an Orsiro 3.5x13 drug-eluting stent.  Erik Hancock had a sleep study that was positive.  It was felt that he would benefit more from a BiPAP but he declined.   Erik Hancock followed up with Kerin Ransom, PA on 02/2020 and reported occasional atypical chest pain.  Ticagrelor was reduced to 60 mg twice daily. At his appointment on 08/2021, he continued to report atypical chest pain. He underwent a nuclear stress test 09/2021 that was low risk with LVEF 56%. Echo performed 09/2021 revealed grade 1 diastolic dysfunction and his ascending aorta was 3.8cm. He had a home sleep study and CPAP was recommended.   At the last visit he was started on valsartan due to poorly controlled blood pressures. He had exertional dyspnea that was thought to be due to obesity and deconditioning. We encouraged exercise and  weight loss. He had ABIs 01/2022 and his arteries were non-compressible. He also had an echo at that time with LVEF 60-65% and moderate LVH. Ascending aorta was 3.8 cm and RA pressure was 8 mmHg. He had a left knee replacement 05/2022.  Today, he is accompanied by his wife. He is recovering well from his knee replacement, able to walk well. He is participating in physical therapy and has been riding a bicycle. While walking he still has pain in his LLE below the knee, as well as mild pain in his bilateral calves. Although he notes that his LE pain overall has improved since his surgery. Unfortunately, he also underwent surgery after suffering an infection in his right thumb after lifting a hay bale. He is unable to make a fist. In the mornings he reports that his right hand and fingers are severely cold. He lost some weight during his surgeries, but has since regained some weight. In clinic today his blood pressure is 156/84. He did take his antihypertensives this morning. At this time he has not yet started Ozempic; he is not sure if he would like to start. He plans to finish the rest of his Victoza first. He denies any palpitations, chest pain, shortness of breath, or peripheral edema. No lightheadedness, headaches, syncope, orthopnea, or PND.   Past Medical History:  Diagnosis Date   Arthritis    "all over" (07/29/2018)   CAD S/P percutaneous coronary angioplasty  a. stent to mLAD 11/2001. b. DES to PDA/mLCx 2007. c. DES to ostial LAD 07/2018.   Chronic lower back pain    CKD (chronic kidney disease), stage III (HCC)    CKD (chronic kidney disease), stage III (HCC)    Diabetic retinopathy (HCC)    Diabetic retinopathy (HCC)    GERD (gastroesophageal reflux disease)    High cholesterol    History of kidney stones    Hypertension    Mobitz type 2 second degree atrioventricular block    a. noted during 07/2018 adm, metoprolol discontinued.   Morbid obesity ([place]) 07/28/2018   Prostatitis    Pulmonary  nodule 2013   CT   Sleep apnea    stopbang=5   Suspected sleep apnea    Type II diabetes mellitus (HCC)     Past Surgical History:  Procedure Laterality Date   CARDIAC CATHETERIZATION  JUNE 2003   PATENT LAD STENT/ BODERLINE OBSTRUCTIVE DISEASE POSTERIOR DESCENDING ARTERY/ NORMAL LVF   CATARACT EXTRACTION W/ INTRAOCULAR LENS  IMPLANT, BILATERAL Bilateral 2017   CERVICAL SCOVILLE FORAMINOTOMY W/ EXCISION OF HERNIATED NUCLEC PULPOSUS  2009   C6 - 7   CORONARY ANGIOPLASTY WITH STENT PLACEMENT  03/30/2006   DR EDMUNDS - 90% LCx_0  TAXUS  DES 3.0 X 20 -> 3.25 mm,   95% RPDA -- TAXUS DES 3.0 X 16 --> 3.5 mm   CORONARY ANGIOPLASTY WITH STENT PLACEMENT  11/2001   (Dr. Ilda Foil for Dr. Radford Pax) - mLAD_1  - TAXUS EXPRESS DES 3.5 x 15    CORONARY ANGIOPLASTY WITH STENT PLACEMENT  07/29/2018   CORONARY STENT INTERVENTION N/A 07/29/2018   Procedure: CORONARY STENT INTERVENTION;  Surgeon: Leonie Man, MD;  Location: [facility];  Service: Cardiovascular;  Laterality: N/A;   CYSTOSCOPY W/ RETROGRADES  05/04/2012   Procedure: CYSTOSCOPY WITH RETROGRADE PYELOGRAM;  Surgeon: Molli Hazard, MD;  Location: [facility];  Service: Urology;  Laterality: Left;   CYSTOSCOPY W/ URETERAL STENT PLACEMENT  05/04/2012   Procedure: CYSTOSCOPY WITH STENT REPLACEMENT;  Surgeon: Molli Hazard, MD;  Location: [facility];  Service: Urology;  Laterality: Left;   CYSTOSCOPY WITH STENT PLACEMENT Left 04/04/2012   CYSTOSCOPY WITH URETEROSCOPY  05/04/2012   Procedure: CYSTOSCOPY WITH URETEROSCOPY;  Surgeon: Molli Hazard, MD;  Location: [facility];  Service: Urology;  Laterality: Left;       I & D EXTREMITY Right 02/11/2022   Procedure: IRRIGATION AND DEBRIDEMENT  OF HAND AND FOREARM;  Surgeon: Iran Planas, MD;  Location: [facility];  Service: Orthopedics;  Laterality: Right;   LEFT HEART CATH AND CORONARY ANGIOGRAPHY N/A 07/29/2018   Procedure: LEFT HEART CATH AND CORONARY ANGIOGRAPHY;  Surgeon: Leonie Man, MD;  Location: [facility];  Service: Cardiovascular;  Laterality: N/A;   LEFT URETEROSCOPIC STONE EXTRACTION  03-14-2003   X2   PARTIAL KNEE ARTHROPLASTY Left 06/02/2022   Procedure: UNICOMPARTMENTAL KNEE;  Surgeon: Renette Butters, MD;  Location: [facility];  Service: Orthopedics;  Laterality: Left;     Current Outpatient Medications  Medication Sig Dispense Refill   ACCU-CHEK AVIVA PLUS test strip USE STRIP TO CHECK GLUCOSE TWICE DAILY TO THREE TIMES DAILY **NEED OFFICE VISIT FOR REFILLS** 100 each 0   acetaminophen (TYLENOL) 500 MG tablet Take 2 tablets (1,000 mg total) by mouth every 6 (six) hours as needed for mild pain or moderate pain. 60 tablet 0   aspirin 81 MG tablet Take 81 mg by mouth daily.  Continuous Blood Gluc Sensor (FREESTYLE LIBRE 2 SENSOR) MISC 1 Device by Does not apply route every 14 (fourteen) days. 6 each 3   doxycycline (VIBRA-TABS) 100 MG tablet Take 1 tablet (100 mg total) by mouth 2 (two) times daily. 20 tablet 0   Insulin Aspart FlexPen 100 UNIT/ML SOPN Inject 30 Units into the skin 2 (two) times daily with a meal. 60 mL 3   insulin glargine (LANTUS SOLOSTAR) 100 UNIT/ML Solostar Pen Inject 24 Units into the skin at bedtime. 30 mL 3   Insulin Pen Needle 32G X 4 MM MISC Use 4x a day 300 each 3   nitroGLYCERIN (NITROLINGUAL) 0.4 MG/SPRAY spray Place 1 spray under the tongue every 5 (five) minutes x 3 doses as needed for chest pain. 12 g 12   pantoprazole (PROTONIX) 40 MG tablet Take 1 tablet (40 mg total) by mouth daily. 90 tablet 3   polyethylene glycol (MIRALAX) 17 g packet Take 17 g by mouth daily. to prevent constipation 14 each 0   rosuvastatin (CRESTOR) 10 MG tablet Take 10 mg by mouth daily.     Semaglutide,0.25 or 0.5MG/DOS, 2 MG/3ML SOPN Inject 0.5 mg under skin weekly 9 mL 3   spironolactone (ALDACTONE) 25 MG tablet Take 1 tablet (25 mg total) by mouth daily. 90 tablet 3   ticagrelor (BRILINTA) 60 MG TABS tablet Take 60 mg by mouth 2 (two) times  daily.     valsartan (DIOVAN) 320 MG tablet Take 1 tablet (320 mg total) by mouth daily. 90 tablet 3   No current facility-administered medications for this visit.    Allergies:   Beta adrenergic blockers    Social History:  The patient  reports that he has never smoked. He has never used smokeless tobacco. He reports that he does not drink alcohol and does not use drugs.   Family History:  The patient's family history includes Alzheimer's disease in his father; Diabetes in his father; Heart attack in his father and paternal grandfather; Heart disease in his father and paternal grandfather; Hypertension in his mother; Lupus in his sister.    ROS:   Please see the history of present illness.    (+) Difficulty with right hand ambulation/weakness (+) Right hand/finger coldness (+) LE pain while walking, L>R All other systems are reviewed and negative.    PHYSICAL EXAM: VS:  BP (!) 158/64 (BP Location: Left Arm, Patient Position: Sitting, Cuff Size: Large)   Pulse 70   Ht _0  (1.702 m)   Wt 270 lb (122.5 kg)   BMI 42.29 kg/m  , BMI Body mass index is 42.29 kg/m. GENERAL:  Well appearing HEENT: Pupils equal round and reactive, fundi not visualized, oral mucosa unremarkable NECK:  No jugular venous distention, waveform within normal limits, carotid upstroke brisk and symmetric, no bruits, no thyromegaly LYMPHATICS:  No cervical adenopathy LUNGS:  Clear to auscultation bilaterally HEART:  RRR.  PMI not displaced or sustained,S1 and S2 within normal limits, no S3, no S4, no clicks, no rubs, no murmurs ABD:  Flat, positive bowel sounds normal in frequency in pitch, no bruits, no rebound, no guarding, no midline pulsatile mass, no hepatomegaly, no splenomegaly EXT:  1+ DP/TP pulses, 1+ bilateral ankle edema, no cyanosis no clubbing SKIN:  No rashes no nodules NEURO:  Cranial nerves II through XII grossly intact, motor grossly intact throughout PSYCH:  Cognitively intact, oriented to  person place and time  EKG:   EKG is personally reviewed. 07/07/2022:  EKG was  not ordered. 09/02/2021: Sinus rhythm.  Rate 65 bpm.  Prior inferior infarct.  Cannot rule out prior anterior infarct.  First-degree AV block. 07/19/18: sinus rhythm at rate 60 bpm.  Prior inferior infarct.  Poor R wave progression.   ABI Doppler  02/12/2022: Summary:  Right: Resting right ankle-brachial index indicates noncompressible right  lower extremity arteries.   Non compressible vessels, normal waveforms.  Left: Resting left ankle-brachial index indicates noncompressible left  lower extremity arteries.   Non compressible vessels, normal waveforms.   Echocardiogram  02/12/2022: Sonographer Comments: Image acquisition challenging due to patient body habitus.  IMPRESSIONS    1. Left ventricular ejection fraction, by estimation, is 60 to 65%. The  left ventricle has normal function. The left ventricle has no regional  wall motion abnormalities. There is moderate concentric left ventricular  hypertrophy. Left ventricular  diastolic parameters are indeterminate.   2. Right ventricular systolic function is normal. The right ventricular  size is normal. There is normal pulmonary artery systolic pressure.   3. Left atrial size was mildly dilated.   4. The mitral valve is normal in structure. No evidence of mitral valve  regurgitation. No evidence of mitral stenosis.   5. The aortic valve is tricuspid. There is mild calcification of the  aortic valve. There is mild thickening of the aortic valve. Aortic valve  regurgitation is not visualized. No aortic stenosis is present.   6. Aortic dilatation noted. There is mild dilatation of the ascending  aorta, measuring 38 mm.   7. The inferior vena cava is dilated in size with >50% respiratory  variability, suggesting right atrial pressure of 8 mmHg.  Lexiscan Myocar 09/24/21 Low risk stress nuclear study with normal perfusion and normal left ventricular  regional and global systolic function.  Echo 09/23/21 1. Left ventricular ejection fraction, by estimation, is 55 to 60%. The  left ventricle has normal function. The left ventricle has no regional  wall motion abnormalities. There is mild left ventricular hypertrophy.  Left ventricular diastolic parameters  are consistent with Grade I diastolic dysfunction (impaired relaxation).   2. Right ventricular systolic function is normal. The right ventricular  size is normal. Tricuspid regurgitation signal is inadequate for assessing  PA pressure.   3. The mitral valve is normal in structure. No evidence of mitral valve  regurgitation. No evidence of mitral stenosis.   4. The aortic valve is tricuspid. Aortic valve regurgitation is not  visualized. Mild aortic valve sclerosis is present, with no evidence of  aortic valve stenosis.   5. Aortic dilatation noted. There is mild dilatation of the ascending  aorta, measuring 38 mm.   6. The inferior vena cava is normal in size with greater than 50%  respiratory variability, suggesting right atrial pressure of 3 mmHg.   LHC 07/29/18: Ost LAD lesion is 85% stenosed -just prior to previous stent. Previously placed prox LAD DES stent is 5% stenosed. Following scoring balloon angioplasty, A drug-eluting stent was successfully placed overlapping the previous stent proximally, using a STENT ORSIRO 3.5X13 --postdilated to 4.0 mm Post intervention, there is a 0% residual stenosis. Previously placed Prox Cx to Dist Cx stent (DES) is widely patent. Ost 2nd Mrg lesion is 40% stenosed. Ost Cx to Prox Cx lesion is 30% stenosed. Previously placed RPDA-1 stent (DES) is widely patent. RPDA-2 lesion is 40% stenosed just beyond stent. The left ventricular systolic function is normal. The left ventricular ejection fraction is 55-65% by visual estimate. LV end diastolic pressure is normal. Post  intervention, there is a 5% residual stenosis. A drug-eluting stent was  successfully placed using a STENT ORSIRO 3.5X13.   Three-vessel CAD with stents in the LAD, circumflex and PDA. Culprit lesion is severe 95% proximal edge lesion and ostial LAD -> successfully treated with scoring balloon angioplasty and DES stent overlapping the original stent (STENT ORSIRO 3.5X13 -postdilated to 4.0 mm) Patent stents in circumflex and RPDA Normal LV function with normal LVEDP.  LHC 04/08/11: PROCEDURES PERFORMED: 1. Percutaneous coronary intervention/stent implantation, mid LAD. 2. Percutaneous closure, right femoral artery/Perclose.   INDICATION: The patient is a 71 year old man with diabetes, hypertension, and hypercholesterolemia, who has developed exertional angina. Dr. Radford Pax has completed coronary angiography revealing a mid LAD stenosis of 80-90% involving a small to moderate sized diagonal branch. He is to undergo percutaneous intervention at this time for definitive revascularization.   Recent Labs: 03/04/2022: ALT 25 05/21/2022: BUN 13; Creatinine, Ser 0.99; Hemoglobin 13.5; Platelets 334; Potassium 3.8; Sodium 144    Lipid Panel    Component Value Date/Time   CHOL 134 06/26/2021 0921   TRIG 84 06/26/2021 0921   HDL 47 06/26/2021 0921   CHOLHDL 2.9 06/26/2021 0921   VLDL 29 05/10/2017 0832   LDLCALC 71 06/26/2021 0921      Wt Readings from Last 3 Encounters:  07/07/22 270 lb (122.5 kg)  06/19/22 256 lb 6.4 oz (116.3 kg)  06/02/22 268 lb (121.6 kg)      ASSESSMENT AND PLAN:  CAD s/p PCI 2019 He has no angina.  Encouraged him to keep working on increasing his exercise.  Continue aspirin, ticagrelor, and rosuvastatin.  LDL goal is less than 70.  He is not on a beta-blocker due to history of second-degree heart block.  Essential hypertension Blood pressure is poorly controlled.  We noted that he has increased weight recently.  He has Ozempic on his med list but has not yet started it.  Encouraged him to go ahead and start it.  Also work on  increasing exercise as above.  Continue valsartan and we will add spironolactone 25 mg daily.  He cannot be on a beta-blocker due to second-degree heart block.  I suspect that he will not tolerate thiazide diuretics given he has had hypokalemia in the past.  He also has some ankle edema on exam so I will not add a calcium channel blocker at this time.  HYPERTENSION CONTROL Vitals:   07/07/22 0911 07/07/22 0937  BP: (!) 156/84 (!) 158/64    The patient's blood pressure is elevated above target today.  In order to address the patient's elevated BP: A current anti-hypertensive medication was adjusted today.          Disposition:    FU with PharmD in 1 month. FU with Blaine Hari C. Oval Linsey, MD, [facility] in 6 months.    Medication Adjustments/Labs and Tests Ordered: Current medicines are reviewed at length with the patient today.  Concerns regarding medicines are outlined above.   Orders Placed This Encounter  Procedures   Lipid panel   Comp Met (CMET)   Meds ordered this encounter  Medications   spironolactone (ALDACTONE) 25 MG tablet    Sig: Take 1 tablet (25 mg total) by mouth daily.    Dispense:  90 tablet    Refill:  3   Patient Instructions  Medication Instructions:  START- Spironolactone 25 mg by mouth daily  *If you need a refill on your cardiac medications before your next appointment, please call your pharmacy*  Lab Work: Fasting Lipid and CMP in 1 week  If you have labs (blood work) drawn today and your tests are completely normal, you will receive your results only by: [place] (if you have MyChart) OR A paper copy in the mail If you have any lab test that is abnormal or we need to change your treatment, we will call you to review the results.   Testing/Procedures: None Ordered   Follow-Up: At [facility], you and your health needs are our priority.  As part of our continuing mission to provide you with exceptional heart care, we have created  designated Provider Care Teams.  These Care Teams include your primary Cardiologist (physician) and Advanced Practice Providers (APPs -  Physician Assistants and Nurse Practitioners) who all work together to provide you with the care you need, when you need it.  We recommend signing up for the patient portal called "MyChart".  Sign up information is provided on this After Visit Summary.  MyChart is used to connect with patients for Virtual Visits (Telemedicine).  Patients are able to view lab/test results, encounter notes, upcoming appointments, etc.  Non-urgent messages can be sent to your provider as well.   To learn more about what you can do with MyChart, go to [email or web address].    Your next appointment:   6 month(s)  The format for your next appointment:   In Person  Provider:   Skeet Latch, MD    Other Instructions 1 Month with PharmD  Important Information About Sugar        I,Mathew Stumpf,acting as a scribe for Skeet Latch, MD.,have documented all relevant documentation on the behalf of Skeet Latch, MD,as directed by  Skeet Latch, MD while in the presence of Skeet Latch, MD.  I, [place] Oval Linsey, MD have reviewed all documentation for this visit.  The documentation of the exam, diagnosis, procedures, and orders on 07/07/2022 are all accurate and complete.  Signed, Berel Najjar C. Oval Linsey, MD, [facility]  07/07/2022 9:48 AM    [facility]

## 2022-07-07 NOTE — Patient Instructions (Signed)
Medication Instructions:  START- Spironolactone 25 mg by mouth daily  *If you need a refill on your cardiac medications before your next appointment, please call your pharmacy*   Lab Work: Fasting Lipid and CMP in 1 week  If you have labs (blood work) drawn today and your tests are completely normal, you will receive your results only by: MyChart Message (if you have MyChart) OR A paper copy in the mail If you have any lab test that is abnormal or we need to change your treatment, we will call you to review the results.   Testing/Procedures: None Ordered   Follow-Up: At [facility], you and your health needs are our priority.  As part of our continuing mission to provide you with exceptional heart care, we have created designated Provider Care Teams.  These Care Teams include your primary Cardiologist (physician) and Advanced Practice Providers (APPs -  Physician Assistants and Nurse Practitioners) who all work together to provide you with the care you need, when you need it.  We recommend signing up for the patient portal called "MyChart".  Sign up information is provided on this After Visit Summary.  MyChart is used to connect with patients for Virtual Visits (Telemedicine).  Patients are able to view lab/test results, encounter notes, upcoming appointments, etc.  Non-urgent messages can be sent to your provider as well.   To learn more about what you can do with MyChart, go to [email or web address].    Your next appointment:   6 month(s)  The format for your next appointment:   In Person  Provider:   Chilton Si, MD    Other Instructions 1 Month with PharmD  Important Information About Sugar

## 2022-07-08 DIAGNOSIS — M1712 Unilateral primary osteoarthritis, left knee: Secondary | ICD-10-CM | POA: Diagnosis not present

## 2022-07-08 DIAGNOSIS — M6281 Muscle weakness (generalized): Secondary | ICD-10-CM | POA: Diagnosis not present

## 2022-07-08 DIAGNOSIS — R262 Difficulty in walking, not elsewhere classified: Secondary | ICD-10-CM | POA: Diagnosis not present

## 2022-07-08 DIAGNOSIS — M25662 Stiffness of left knee, not elsewhere classified: Secondary | ICD-10-CM | POA: Diagnosis not present

## 2022-07-08 DIAGNOSIS — Z96652 Presence of left artificial knee joint: Secondary | ICD-10-CM | POA: Diagnosis not present

## 2022-07-13 ENCOUNTER — Other Ambulatory Visit: Payer: Self-pay | Admitting: Family Medicine

## 2022-07-13 ENCOUNTER — Telehealth: Payer: Self-pay | Admitting: Pharmacy Technician

## 2022-07-13 NOTE — Telephone Encounter (Signed)
Patient Advocate Encounter   Received notification from CoverMyMeds that prior authorization for Semglee is required/requested.  Pt no longer on this med. Archived request for now. Key: [id number]

## 2022-07-15 ENCOUNTER — Other Ambulatory Visit: Payer: Medicare PPO

## 2022-07-15 DIAGNOSIS — E785 Hyperlipidemia, unspecified: Secondary | ICD-10-CM | POA: Diagnosis not present

## 2022-07-15 DIAGNOSIS — Z79899 Other long term (current) drug therapy: Secondary | ICD-10-CM | POA: Diagnosis not present

## 2022-07-15 DIAGNOSIS — R262 Difficulty in walking, not elsewhere classified: Secondary | ICD-10-CM | POA: Diagnosis not present

## 2022-07-15 DIAGNOSIS — I10 Essential (primary) hypertension: Secondary | ICD-10-CM | POA: Diagnosis not present

## 2022-07-15 DIAGNOSIS — M25662 Stiffness of left knee, not elsewhere classified: Secondary | ICD-10-CM | POA: Diagnosis not present

## 2022-07-15 DIAGNOSIS — M6281 Muscle weakness (generalized): Secondary | ICD-10-CM | POA: Diagnosis not present

## 2022-07-15 DIAGNOSIS — M1712 Unilateral primary osteoarthritis, left knee: Secondary | ICD-10-CM | POA: Diagnosis not present

## 2022-07-15 DIAGNOSIS — Z96652 Presence of left artificial knee joint: Secondary | ICD-10-CM | POA: Diagnosis not present

## 2022-07-15 LAB — COMPREHENSIVE METABOLIC PANEL
ALT: 23 IU/L (ref 0–44)
AST: 23 IU/L (ref 0–40)
Albumin/Globulin Ratio: 1.7 (ref 1.2–2.2)
Albumin: 4.1 g/dL (ref 3.9–4.9)
Alkaline Phosphatase: 93 IU/L (ref 44–121)
BUN/Creatinine Ratio: 11 (ref 10–24)
BUN: 11 mg/dL (ref 8–27)
Bilirubin Total: 0.8 mg/dL (ref 0.0–1.2)
CO2: 20 mmol/L (ref 20–29)
Calcium: 9.3 mg/dL (ref 8.6–10.2)
Chloride: 110 mmol/L — ABNORMAL HIGH (ref 96–106)
Creatinine, Ser: 1 mg/dL (ref 0.76–1.27)
Globulin, Total: 2.4 g/dL (ref 1.5–4.5)
Glucose: 105 mg/dL — ABNORMAL HIGH (ref 70–99)
Potassium: 4.4 mmol/L (ref 3.5–5.2)
Sodium: 144 mmol/L (ref 134–144)
Total Protein: 6.5 g/dL (ref 6.0–8.5)
eGFR: 81 mL/min/{1.73_m2} (ref >59)

## 2022-07-15 LAB — LIPID PANEL
Chol/HDL Ratio: 2.1 ratio (ref 0.0–5.0)
Cholesterol, Total: 115 mg/dL (ref 100–199)
HDL: 55 mg/dL (ref >39)
LDL Chol Calc (NIH): 43 mg/dL (ref 0–99)
Triglycerides: 85 mg/dL (ref 0–149)
VLDL Cholesterol Cal: 17 mg/dL (ref 5–40)

## 2022-07-20 ENCOUNTER — Other Ambulatory Visit: Payer: Self-pay | Admitting: Cardiovascular Disease

## 2022-07-20 ENCOUNTER — Other Ambulatory Visit: Payer: Self-pay | Admitting: Family Medicine

## 2022-07-20 NOTE — Telephone Encounter (Signed)
Rx request sent to pharmacy.  

## 2022-07-30 ENCOUNTER — Encounter: Payer: Self-pay | Admitting: Family Medicine

## 2022-08-07 ENCOUNTER — Ambulatory Visit: Payer: Medicare PPO

## 2022-08-10 ENCOUNTER — Other Ambulatory Visit: Payer: Self-pay | Admitting: Family Medicine

## 2022-08-18 ENCOUNTER — Telehealth: Payer: Self-pay | Admitting: Family Medicine

## 2022-08-18 ENCOUNTER — Other Ambulatory Visit: Payer: Self-pay

## 2022-08-18 DIAGNOSIS — N1831 Chronic kidney disease, stage 3a: Secondary | ICD-10-CM

## 2022-08-18 MED ORDER — INSULIN ASPART FLEXPEN 100 UNIT/ML ~~LOC~~ SOPN: 30.0000 [IU] | PEN_INJECTOR | Freq: Two times a day (BID) | SUBCUTANEOUS | 3 refills | Status: DC

## 2022-08-18 NOTE — Telephone Encounter (Signed)
Received call from patient's wife to reply to notification received stating patient needs to come in to follow up on his A1C. Erik Hancock stated patient was referred to a different provider for A1C care and has been receiving care from that provider since that time.   Please advise with questions at [phone].

## 2022-09-02 ENCOUNTER — Ambulatory Visit: Payer: Medicare PPO

## 2022-09-10 ENCOUNTER — Other Ambulatory Visit: Payer: Self-pay | Admitting: Family Medicine

## 2022-09-10 NOTE — Telephone Encounter (Signed)
Requested medication (s) are due for refill today- yes  Requested medication (s) are on the active medication list -yes  Future visit scheduled -no  Last refill: 08/10/22 #100  Notes to clinic: Per last PC note- patient has Endocrinologist for diabetes care- ok to fill supplies?  Requested Prescriptions  Pending Prescriptions Disp Refills   ACCU-CHEK AVIVA PLUS test strip [Pharmacy Med Name: Accu-Chek Aviva Plus In Vitro Strip] 100 each 0    Sig: USE  STRIP TO CHECK GLUCOSE TWICE DAILY TO THREE TIMES DAILY . APPOINTMENT REQUIRED FOR FUTURE REFILLS     Endocrinology: Diabetes - Testing Supplies Passed - 09/10/2022  2:43 PM      Passed - Valid encounter within last 12 months    Recent Outpatient Visits           8 months ago Acute pain of left knee   [place] [name], Cammie Mcgee, MD   1 year ago Puncture wound   [place] Dennard Schaumann, Cammie Mcgee, MD   1 year ago Encounter for Medicare annual wellness exam   [place] [name], MD   1 year ago Prostate cancer screening   [place] [name], MD   2 years ago Cerebellar cerebrovascular accident (CVA) without late effect   [place], Cammie Mcgee, MD                 Requested Prescriptions  Pending Prescriptions Disp Refills   ACCU-CHEK AVIVA PLUS test strip [Pharmacy Med Name: Accu-Chek Aviva Plus In Vitro Strip] 100 each 0    Sig: USE  STRIP TO CHECK GLUCOSE TWICE DAILY TO THREE TIMES DAILY . APPOINTMENT REQUIRED FOR FUTURE REFILLS     Endocrinology: Diabetes - Testing Supplies Passed - 09/10/2022  2:43 PM      Passed - Valid encounter within last 12 months    Recent Outpatient Visits           8 months ago Acute pain of left knee   [place] Dennard Schaumann, Cammie Mcgee, MD   1 year ago Puncture wound   [place] Dennard Schaumann Cammie Mcgee, MD   1 year ago Encounter for Medicare annual  wellness exam   [place] [name], MD   1 year ago Prostate cancer screening   [place] [name], MD   2 years ago Cerebellar cerebrovascular accident (CVA) without late effect   [place], Cammie Mcgee, MD

## 2022-10-07 ENCOUNTER — Telehealth: Payer: Self-pay | Admitting: Family Medicine

## 2022-10-07 NOTE — Telephone Encounter (Signed)
Left message for patient to call back and schedule Medicare Annual Wellness Visit (AWV) either virtually or in office. Left  my jabber number [phone]  *due 11/23/2017 awvi per [employer]     please schedule with Nurse Health Adviser   45 min for awv-i and in office appointments 30 min for awv-s  phone/virtual appointments  

## 2022-10-08 ENCOUNTER — Other Ambulatory Visit: Payer: Self-pay | Admitting: Internal Medicine

## 2022-10-08 DIAGNOSIS — Z794 Long term (current) use of insulin: Secondary | ICD-10-CM

## 2022-10-09 ENCOUNTER — Other Ambulatory Visit: Payer: Self-pay | Admitting: Internal Medicine

## 2022-10-09 DIAGNOSIS — E1122 Type 2 diabetes mellitus with diabetic chronic kidney disease: Secondary | ICD-10-CM

## 2022-10-13 ENCOUNTER — Telehealth: Payer: Self-pay

## 2022-10-13 ENCOUNTER — Other Ambulatory Visit (HOSPITAL_COMMUNITY): Payer: Self-pay

## 2022-10-13 DIAGNOSIS — E1165 Type 2 diabetes mellitus with hyperglycemia: Secondary | ICD-10-CM

## 2022-10-13 NOTE — Telephone Encounter (Signed)
Pharmacy Patient Advocate Encounter   Received notification from [facility] that prior authorization for Semglee is required/requested.    PA submitted on 10/13/2022 to [facility]  via CoverMyMeds  Key  [id number]  Status is pending

## 2022-10-14 MED ORDER — TRESIBA FLEXTOUCH 200 UNIT/ML ~~LOC~~ SOPN: 24.0000 [IU] | PEN_INJECTOR | Freq: Every day | SUBCUTANEOUS | 1 refills | Status: DC

## 2022-10-14 NOTE — Telephone Encounter (Signed)
Pharmacy Patient Advocate Encounter  Received notification from [facility] that the request for prior authorization for [facility] has been denied due to      Specialty Pharmacy Patient Advocate Fax:  [phone]

## 2022-10-17 ENCOUNTER — Other Ambulatory Visit: Payer: Self-pay | Admitting: Family Medicine

## 2022-10-19 NOTE — Telephone Encounter (Signed)
Rx for U200 sent to preferred pharmacy.

## 2022-10-23 ENCOUNTER — Ambulatory Visit: Payer: Medicare PPO | Admitting: Internal Medicine

## 2022-10-27 ENCOUNTER — Other Ambulatory Visit: Payer: Self-pay | Admitting: Family Medicine

## 2022-11-05 ENCOUNTER — Telehealth: Payer: Self-pay

## 2022-11-05 ENCOUNTER — Other Ambulatory Visit (HOSPITAL_COMMUNITY): Payer: Self-pay

## 2022-11-05 DIAGNOSIS — E1159 Type 2 diabetes mellitus with other circulatory complications: Secondary | ICD-10-CM

## 2022-11-05 NOTE — Telephone Encounter (Signed)
Patient Advocate Encounter   Received notification from [facility] that prior authorization is required for Insulin Degludec FlexTouch 200UNIT/ML Evaristo Bury)  Submitted: 11/05/22 Key [id number]   PA is pending

## 2022-11-09 ENCOUNTER — Other Ambulatory Visit (HOSPITAL_COMMUNITY): Payer: Self-pay

## 2022-11-09 NOTE — Telephone Encounter (Signed)
Pharmacy Patient Advocate Encounter  Patient's insurance won't cover the generic of Evaristo Bury, but test claim shows that they will cover brand.   Test claim shows a copay of $32.66  Nothing further needed at this time.

## 2022-11-10 MED ORDER — TRESIBA FLEXTOUCH 200 UNIT/ML ~~LOC~~ SOPN: 24.0000 [IU] | PEN_INJECTOR | Freq: Every day | SUBCUTANEOUS | 1 refills | Status: DC

## 2022-11-10 NOTE — Addendum Note (Signed)
Addended by: Kenyon Ana on: 11/10/2022 10:46 AM   Modules accepted: Orders

## 2022-11-21 ENCOUNTER — Other Ambulatory Visit: Payer: Self-pay | Admitting: Cardiovascular Disease

## 2022-11-21 ENCOUNTER — Other Ambulatory Visit: Payer: Self-pay | Admitting: Family Medicine

## 2022-11-24 ENCOUNTER — Other Ambulatory Visit: Payer: Self-pay | Admitting: Family Medicine

## 2022-11-24 NOTE — Telephone Encounter (Signed)
Requested Prescriptions  Pending Prescriptions Disp Refills   pantoprazole (PROTONIX) 40 MG tablet [Pharmacy Med Name: Pantoprazole Sodium 40 MG Oral Tablet Delayed Release] 90 tablet 1    Sig: Take 1 tablet by mouth once daily     Gastroenterology: Proton Pump Inhibitors Passed - 11/21/2022  7:15 PM      Passed - Valid encounter within last 12 months    Recent Outpatient Visits           11 months ago Acute pain of left knee   [place] [facility]ennard Schaumann, Cammie Mcgee, MD   1 year ago Puncture wound   [place] [name], MD   1 year ago Encounter for Medicare annual wellness exam   [place] [name], MD   1 year ago Prostate cancer screening   [place] [name], MD   2 years ago Cerebellar cerebrovascular accident (CVA) without late effect   [place], Cammie Mcgee, MD

## 2022-11-24 NOTE — Telephone Encounter (Signed)
Rx request sent to pharmacy.  

## 2022-12-07 NOTE — Progress Notes (Unsigned)
Subjective:   Erik Hancock is a 72 y.o. male who presents for an Initial Medicare Annual Wellness Visit.  Review of Systems    ***       Objective:    There were no vitals filed for this visit. There is no height or weight on file to calculate BMI.     06/03/2022    3:47 AM 06/02/2022   10:58 AM 05/21/2022   11:24 AM 02/11/2022    8:26 PM 12/09/2021   10:32 AM 08/09/2021    8:23 AM 03/06/2021   10:33 AM  Advanced Directives  Does Patient Have a Medical Advance Directive? No No No No No No No  Would patient like information on creating a medical advance directive? No - Patient declined No - Patient declined No - Patient declined No - Patient declined  No - Patient declined     Current Medications (verified) Outpatient Encounter Medications as of 12/08/2022  Medication Sig   acetaminophen (TYLENOL) 500 MG tablet Take 2 tablets (1,000 mg total) by mouth every 6 (six) hours as needed for mild pain or moderate pain.   aspirin 81 MG tablet Take 81 mg by mouth daily.   Continuous Blood Gluc Sensor (FREESTYLE LIBRE 2 SENSOR) MISC 1 Device by Does not apply route every 14 (fourteen) days.   doxycycline (VIBRA-TABS) 100 MG tablet Take 1 tablet (100 mg total) by mouth 2 (two) times daily.   glucose blood (ACCU-CHEK AVIVA PLUS) test strip USE 1 STRIP TO CHECK GLUCOSE 2 TO 3 TIMES DAILY . APPOINTMENT REQUIRED FOR FUTURE REFILLS   Insulin Aspart FlexPen (NOVOLOG) 100 UNIT/ML Inject 30 Units into the skin 2 (two) times daily with a meal.   Insulin Pen Needle 32G X 4 MM MISC Use 4x a day   nitroGLYCERIN (NITROLINGUAL) 0.4 MG/SPRAY spray Place 1 spray under the tongue every 5 (five) minutes x 3 doses as needed for chest pain.   OZEMPIC, 0.25 OR 0.5 MG/DOSE, 2 MG/3ML SOPN INJECT 0.5MG  UNDER THE SKIN WEEKLY   pantoprazole (PROTONIX) 40 MG tablet Take 1 tablet by mouth once daily   polyethylene glycol (MIRALAX) 17 g packet Take 17 g by mouth daily. to prevent constipation   rosuvastatin (CRESTOR)  10 MG tablet Take 1 tablet by mouth once daily   SEMGLEE, YFGN, 100 UNIT/ML Pen INJECT 24 UNITS SUBCUTANEOUSLY AT BEDTIME   spironolactone (ALDACTONE) 25 MG tablet Take 1 tablet (25 mg total) by mouth daily.   ticagrelor (BRILINTA) 60 MG TABS tablet Take 1 tablet by mouth twice daily   TRESIBA FLEXTOUCH 200 UNIT/ML FlexTouch Pen Inject 24 Units into the skin daily.   valsartan (DIOVAN) 320 MG tablet Take 1 tablet (320 mg total) by mouth daily.   No facility-administered encounter medications on file as of 12/08/2022.    Allergies (verified) Beta adrenergic blockers   History: Past Medical History:  Diagnosis Date   Arthritis    "all over" (07/29/2018)   CAD S/P percutaneous coronary angioplasty    a. stent to mLAD 11/2001. b. DES to PDA/mLCx 2007. c. DES to ostial LAD 07/2018.   Chronic lower back pain    CKD (chronic kidney disease), stage III (HCC)    CKD (chronic kidney disease), stage III (HCC)    Diabetic retinopathy (HCC)    Diabetic retinopathy (HCC)    GERD (gastroesophageal reflux disease)    High cholesterol    History of kidney stones    Hypertension    Mobitz type 2 second  degree atrioventricular block    a. noted during 07/2018 adm, metoprolol discontinued.   Morbid obesity ([place]) 07/28/2018   Prostatitis    Pulmonary nodule 2013   CT   Sleep apnea    stopbang=5   Suspected sleep apnea    Type II diabetes mellitus (HCC)    Past Surgical History:  Procedure Laterality Date   CARDIAC CATHETERIZATION  JUNE 2003   PATENT LAD STENT/ BODERLINE OBSTRUCTIVE DISEASE POSTERIOR DESCENDING ARTERY/ NORMAL LVF   CATARACT EXTRACTION W/ INTRAOCULAR LENS  IMPLANT, BILATERAL Bilateral 2017   CERVICAL SCOVILLE FORAMINOTOMY W/ EXCISION OF HERNIATED NUCLEC PULPOSUS  2009   C6 - 7   CORONARY ANGIOPLASTY WITH STENT PLACEMENT  03/30/2006   DR EDMUNDS - 90% LCx@OM2  TAXUS  DES 3.0 X 20 -> 3.25 mm,   95% RPDA -- TAXUS DES 3.0 X 16 --> 3.5 mm   CORONARY ANGIOPLASTY WITH STENT PLACEMENT   11/2001   (Dr. Ilda Foil for Dr. Radford Pax) - mLAD@D2  - TAXUS EXPRESS DES 3.5 x 15    CORONARY ANGIOPLASTY WITH STENT PLACEMENT  07/29/2018   CORONARY STENT INTERVENTION N/A 07/29/2018   Procedure: CORONARY STENT INTERVENTION;  Surgeon: Leonie Man, MD;  Location: [facility];  Service: Cardiovascular;  Laterality: N/A;   CYSTOSCOPY W/ RETROGRADES  05/04/2012   Procedure: CYSTOSCOPY WITH RETROGRADE PYELOGRAM;  Surgeon: Molli Hazard, MD;  Location: [facility];  Service: Urology;  Laterality: Left;   CYSTOSCOPY W/ URETERAL STENT PLACEMENT  05/04/2012   Procedure: CYSTOSCOPY WITH STENT REPLACEMENT;  Surgeon: Molli Hazard, MD;  Location: [facility];  Service: Urology;  Laterality: Left;   CYSTOSCOPY WITH STENT PLACEMENT Left 04/04/2012   CYSTOSCOPY WITH URETEROSCOPY  05/04/2012   Procedure: CYSTOSCOPY WITH URETEROSCOPY;  Surgeon: Molli Hazard, MD;  Location: [facility];  Service: Urology;  Laterality: Left;       I & D EXTREMITY Right 02/11/2022   Procedure: IRRIGATION AND DEBRIDEMENT  OF HAND AND FOREARM;  Surgeon: Iran Planas, MD;  Location: [facility];  Service: Orthopedics;  Laterality: Right;   LEFT HEART CATH AND CORONARY ANGIOGRAPHY N/A 07/29/2018   Procedure: LEFT HEART CATH AND CORONARY ANGIOGRAPHY;  Surgeon: Leonie Man, MD;  Location: [facility];  Service: Cardiovascular;  Laterality: N/A;   LEFT URETEROSCOPIC STONE EXTRACTION  03-14-2003   X2   PARTIAL KNEE ARTHROPLASTY Left 06/02/2022   Procedure: UNICOMPARTMENTAL KNEE;  Surgeon: Renette Butters, MD;  Location: [facility];  Service: Orthopedics;  Laterality: Left;   Family History  Problem Relation Age of Onset   Hypertension Mother    Heart attack Father    Heart disease Father    Alzheimer's disease Father    Diabetes Father    Heart attack Paternal Grandfather    Heart disease Paternal Grandfather    Lupus Sister    Social History   Socioeconomic History   Marital status: Married    Spouse name:  Not on file   Number of children: Not on file   Years of education: Not on file   Highest education level: Not on file  Occupational History   Not on file  Tobacco Use   Smoking status: Never   Smokeless tobacco: Never  Vaping Use   Vaping Use: Never used  Substance and Sexual Activity   Alcohol use: Never   Drug use: Never   Sexual activity: Not Currently  Other Topics Concern   Not on file  Social History Narrative   Right  handed    Lives with wife    Social Determinants of Health   Financial Resource Strain: Not on file  Food Insecurity: Not on file  Transportation Needs: Not on file  Physical Activity: Not on file  Stress: Not on file  Social Connections: Not on file    Tobacco Counseling Counseling given: Not Answered   Clinical Intake:                 Diabetic?Yes Nutrition Risk Assessment:  Has the patient had any N/V/D within the last 2 months?  {YES/NO:21197} Does the patient have any non-healing wounds?  {YES/NO:21197} Has the patient had any unintentional weight loss or weight gain?  {YES/NO:21197}  Diabetes:  Is the patient diabetic?  {YES/NO:21197} If diabetic, was a CBG obtained today?  {YES/NO:21197} Did the patient bring in their glucometer from home?  {YES/NO:21197} How often do you monitor your CBG's? ***.   Financial Strains and Diabetes Management:  Are you having any financial strains with the device, your supplies or your medication? {YES/NO:21197}.  Does the patient want to be seen by Chronic Care Management for management of their diabetes?  {YES/NO:21197} Would the patient like to be referred to a Nutritionist or for Diabetic Management?  {YES/NO:21197}  Diabetic Exams:  {Diabetic Eye Exam:2101801} {Diabetic Foot Exam:2101802}          Activities of Daily Living    06/03/2022    3:51 AM 06/03/2022    3:48 AM  In your present state of health, do you have any difficulty performing the following activities:   Hearing?  1  Vision?  1  Difficulty concentrating or making decisions?  0  Walking or climbing stairs?  0  Dressing or bathing?  0  Doing errands, shopping? 0     Patient Care Team: Donita Brooks, MD as PCP - General (Family Medicine) Chilton Si, MD as PCP - Cardiology (Cardiology) Tat, Octaviano Batty, DO as Consulting Physician (Neurology) Sherrie George, MD as Consulting Physician (Ophthalmology)  Indicate any recent Medical Services you may have received from other than [facility] providers in the past year (date may be approximate).     Assessment:   This is a routine wellness examination for [place].  Hearing/Vision screen No results found.  Dietary issues and exercise activities discussed:     Goals Addressed   None    Depression Screen    03/04/2022    3:00 PM 08/09/2021    8:22 AM 06/26/2021    9:22 AM 07/04/2020    9:29 AM 01/23/2019    8:56 AM 01/18/2018    8:01 AM 12/10/2017    8:15 AM  PHQ 2/9 Scores  PHQ - 2 Score 0 0 0 0 0 0 0    Fall Risk    03/04/2022    3:00 PM 12/09/2021   10:32 AM 08/09/2021    8:20 AM 06/26/2021    9:22 AM 03/06/2021   10:33 AM  Fall Risk   Falls in the past year? 0 0 0 0 0  Number falls in past yr: 0  0 0 0  Injury with Fall? 0 0 0 0 0  Risk for fall due to :   No Fall Risks No Fall Risks   Follow up   Falls evaluation completed;Education provided;Falls prevention discussed Falls evaluation completed     FALL RISK PREVENTION PERTAINING TO THE HOME:  Any stairs in or around the home? {YES/NO:21197} If so, are there any without handrails? {YES/NO:21197}  Home free of loose throw rugs in walkways, pet beds, electrical cords, etc? {YES/NO:21197} Adequate lighting in your home to reduce risk of falls? {YES/NO:21197}  ASSISTIVE DEVICES UTILIZED TO PREVENT FALLS:  Life alert? {YES/NO:21197} Use of a cane, walker or w/c? {YES/NO:21197} Grab bars in the bathroom? {YES/NO:21197} Shower chair or bench in shower?  {YES/NO:21197} Elevated toilet seat or a handicapped toilet? {YES/NO:21197}  TIMED UP AND GO:  Was the test performed? {YES/NO:21197}.  Length of time to ambulate 10 feet: *** sec.   {Appearance of HQIO:9629528}  Cognitive Function:        08/09/2021    8:20 AM 07/04/2020    9:30 AM  6CIT Screen  What Year? 0 points 0 points  What month? 0 points 0 points  What time? 0 points 0 points  Count back from 20 0 points 0 points  Months in reverse 0 points 0 points  Repeat phrase 4 points 0 points  Total Score 4 points 0 points    Immunizations Immunization History  Administered Date(s) Administered   Fluad Quad(high Dose 65+) 08/08/2019, 09/24/2021   Hep A / Hep B 01/05/2019, 04/05/2019, 09/11/2019   Influenza,inj,Quad PF,6+ Mos 09/07/2013, 08/03/2014, 10/22/2015   Influenza,inj,Quad PF,6-35 Mos 07/06/2018   Influenza-Unspecified 08/08/2019   PFIZER(Purple Top)SARS-COV-2 Vaccination 01/15/2020, 02/05/2020, 11/14/2020, 04/25/2021   PNEUMOCOCCAL CONJUGATE-20 04/25/2021   Pneumococcal Conjugate-13 02/11/2017, 09/04/2017   Pneumococcal Polysaccharide-23 09/07/2013, 07/06/2018   Tdap 02/02/2014, 01/05/2019, 02/11/2022   Zoster Recombinat (Shingrix) 07/06/2018, 10/31/2018    TDAP status: Up to date  {Flu Vaccine status:2101806}  Pneumococcal vaccine status: Up to date  Covid-19 vaccine status: Information provided on how to obtain vaccines.   Qualifies for Shingles Vaccine? Yes   Zostavax completed No   Shingrix Completed?: Yes  Screening Tests Health Maintenance  Topic Date Due   Diabetic kidney evaluation - Urine ACR  Never done   Hepatitis C Screening  Never done   INFLUENZA VACCINE  06/23/2022   COVID-19 Vaccine (5 - 2023-24 season) 07/24/2022   OPHTHALMOLOGY EXAM  08/07/2022   Medicare Annual Wellness (AWV)  08/09/2022   FOOT EXAM  08/21/2022   Fecal DNA (Cologuard)  09/13/2022   HEMOGLOBIN A1C  11/20/2022   Diabetic kidney evaluation - eGFR measurement   07/16/2023   DTaP/Tdap/Td (4 - Td or Tdap) 02/12/2032   Pneumonia Vaccine 40+ Years old  Completed   Zoster Vaccines- Shingrix  Completed   HPV VACCINES  Aged Out    Health Maintenance  Health Maintenance Due  Topic Date Due   Diabetic kidney evaluation - Urine ACR  Never done   Hepatitis C Screening  Never done   INFLUENZA VACCINE  06/23/2022   COVID-19 Vaccine (5 - 2023-24 season) 07/24/2022   OPHTHALMOLOGY EXAM  08/07/2022   Medicare Annual Wellness (AWV)  08/09/2022   FOOT EXAM  08/21/2022   Fecal DNA (Cologuard)  09/13/2022   HEMOGLOBIN A1C  11/20/2022    {Colorectal cancer screening:2101809}  Lung Cancer Screening: (Low Dose CT Chest recommended if Age 65-80 years, 30 pack-year currently smoking OR have quit w/in 15years.) {DOES NOT does:27190::"does not"} qualify.   Lung Cancer Screening Referral: ***  Additional Screening:  Hepatitis C Screening: {DOES NOT does:27190::"does not"} qualify; Completed ***  Vision Screening: Recommended annual ophthalmology exams for early detection of glaucoma and other disorders of the eye. Is the patient up to date with their annual eye exam?  No  Who is the provider or what is the name of the office in which  the patient attends annual eye exams? Dr. Tempie Hoist  If pt is not established with a provider, would they like to be referred to a provider to establish care? No .   Dental Screening: Recommended annual dental exams for proper oral hygiene  Community Resource Referral / Chronic Care Management: CRR required this visit?  {YES/NO:21197}  CCM required this visit?  {YES/NO:21197}     Plan:     I have personally reviewed and noted the following in the patient's chart:   Medical and social history Use of alcohol, tobacco or illicit drugs  Current medications and supplements including opioid prescriptions. {Opioid Prescriptions:[phone]} Functional ability and status Nutritional status Physical activity Advanced  directives List of other physicians Hospitalizations, surgeries, and ER visits in previous 12 months Vitals Screenings to include cognitive, depression, and falls Referrals and appointments  In addition, I have reviewed and discussed with patient certain preventive protocols, quality metrics, and best practice recommendations. A written personalized care plan for preventive services as well as general preventive health recommendations were provided to patient.     Denman George [place], [place]   [id number]/73/[id number]   Nurse Notes: ***

## 2022-12-07 NOTE — Patient Instructions (Signed)
[name] , Thank you for taking time to come for your Medicare Wellness Visit. I appreciate your ongoing commitment to your health goals. Please review the following plan we discussed and let me know if I can assist you in the future.   These are the goals we discussed:  Goals   None     This is a list of the screening recommended for you and due dates:  Health Maintenance  Topic Date Due   Yearly kidney health urinalysis for diabetes  Never done   Hepatitis C Screening: USPSTF Recommendation to screen - Ages 66-79 yo.  Never done   Flu Shot  06/23/2022   COVID-19 Vaccine (5 - 2023-24 season) 07/24/2022   Eye exam for diabetics  08/07/2022   Medicare Annual Wellness Visit  08/09/2022   Complete foot exam   08/21/2022   Cologuard (Stool DNA test)  09/13/2022   Hemoglobin A1C  11/20/2022   Yearly kidney function blood test for diabetes  07/16/2023   DTaP/Tdap/Td vaccine (4 - Td or Tdap) 02/12/2032   Pneumonia Vaccine  Completed   Zoster (Shingles) Vaccine  Completed   HPV Vaccine  Aged Out    Advanced directives: Forms are available if you choose in the future to pursue completion.  This is recommended in order to make sure that your health wishes are honored in the event that you are unable to verbalize them to the provider.    Conditions/risks identified: Aim for 30 minutes of exercise or brisk walking, 6-8 glasses of water, and 5 servings of fruits and vegetables each day.   Next appointment: Follow up in one year for your annual wellness visit.   Preventive Care 65 Years and Older, Male  Preventive care refers to lifestyle choices and visits with your health care provider that can promote health and wellness. What does preventive care include? A yearly physical exam. This is also called an annual well check. Dental exams once or twice a year. Routine eye exams. Ask your health care provider how often you should have your eyes checked. Personal lifestyle choices,  including: Daily care of your teeth and gums. Regular physical activity. Eating a healthy diet. Avoiding tobacco and drug use. Limiting alcohol use. Practicing safe sex. Taking low doses of aspirin every day. Taking vitamin and mineral supplements as recommended by your health care provider. What happens during an annual well check? The services and screenings done by your health care provider during your annual well check will depend on your age, overall health, lifestyle risk factors, and family history of disease. Counseling  Your health care provider may ask you questions about your: Alcohol use. Tobacco use. Drug use. Emotional well-being. Home and relationship well-being. Sexual activity. Eating habits. History of falls. Memory and ability to understand (cognition). Work and work Astronomer. Screening  You may have the following tests or measurements: Height, weight, and BMI. Blood pressure. Lipid and cholesterol levels. These may be checked every 5 years, or more frequently if you are over 56 years old. Skin check. Lung cancer screening. You may have this screening every year starting at age 45 if you have a 30-pack-year history of smoking and currently smoke or have quit within the past 15 years. Fecal occult blood test (FOBT) of the stool. You may have this test every year starting at age 71. Flexible sigmoidoscopy or colonoscopy. You may have a sigmoidoscopy every 5 years or a colonoscopy every 10 years starting at age 11. Prostate cancer screening. Recommendations  will vary depending on your family history and other risks. Hepatitis C blood test. Hepatitis B blood test. Sexually transmitted disease (STD) testing. Diabetes screening. This is done by checking your blood sugar (glucose) after you have not eaten for a while (fasting). You may have this done every 1-3 years. Abdominal aortic aneurysm (AAA) screening. You may need this if you are a current or former  smoker. Osteoporosis. You may be screened starting at age 88 if you are at high risk. Talk with your health care provider about your test results, treatment options, and if necessary, the need for more tests. Vaccines  Your health care provider may recommend certain vaccines, such as: Influenza vaccine. This is recommended every year. Tetanus, diphtheria, and acellular pertussis (Tdap, Td) vaccine. You may need a Td booster every 10 years. Zoster vaccine. You may need this after age 55. Pneumococcal 13-valent conjugate (PCV13) vaccine. One dose is recommended after age 59. Pneumococcal polysaccharide (PPSV23) vaccine. One dose is recommended after age 74. Talk to your health care provider about which screenings and vaccines you need and how often you need them. This information is not intended to replace advice given to you by your health care provider. Make sure you discuss any questions you have with your health care provider. Document Released: 12/06/2015 Document Revised: 07/29/2016 Document Reviewed: 09/10/2015 Elsevier Interactive Patient Education  2017 [employer] Prevention in the Home Falls can cause injuries. They can happen to people of all ages. There are many things you can do to make your home safe and to help prevent falls. What can I do on the outside of my home? Regularly fix the edges of walkways and driveways and fix any cracks. Remove anything that might make you trip as you walk through a door, such as a raised step or threshold. Trim any bushes or trees on the path to your home. Use bright outdoor lighting. Clear any walking paths of anything that might make someone trip, such as rocks or tools. Regularly check to see if handrails are loose or broken. Make sure that both sides of any steps have handrails. Any raised decks and porches should have guardrails on the edges. Have any leaves, snow, or ice cleared regularly. Use sand or salt on walking paths during  winter. Clean up any spills in your garage right away. This includes oil or grease spills. What can I do in the bathroom? Use night lights. Install grab bars by the toilet and in the tub and shower. Do not use towel bars as grab bars. Use non-skid mats or decals in the tub or shower. If you need to sit down in the shower, use a plastic, non-slip stool. Keep the floor dry. Clean up any water that spills on the floor as soon as it happens. Remove soap buildup in the tub or shower regularly. Attach bath mats securely with double-sided non-slip rug tape. Do not have throw rugs and other things on the floor that can make you trip. What can I do in the bedroom? Use night lights. Make sure that you have a light by your bed that is easy to reach. Do not use any sheets or blankets that are too big for your bed. They should not hang down onto the floor. Have a firm chair that has side arms. You can use this for support while you get dressed. Do not have throw rugs and other things on the floor that can make you trip. What can I do  in the kitchen? Clean up any spills right away. Avoid walking on wet floors. Keep items that you use a lot in easy-to-reach places. If you need to reach something above you, use a strong step stool that has a grab bar. Keep electrical cords out of the way. Do not use floor polish or wax that makes floors slippery. If you must use wax, use non-skid floor wax. Do not have throw rugs and other things on the floor that can make you trip. What can I do with my stairs? Do not leave any items on the stairs. Make sure that there are handrails on both sides of the stairs and use them. Fix handrails that are broken or loose. Make sure that handrails are as long as the stairways. Check any carpeting to make sure that it is firmly attached to the stairs. Fix any carpet that is loose or worn. Avoid having throw rugs at the top or bottom of the stairs. If you do have throw rugs,  attach them to the floor with carpet tape. Make sure that you have a light switch at the top of the stairs and the bottom of the stairs. If you do not have them, ask someone to add them for you. What else can I do to help prevent falls? Wear shoes that: Do not have high heels. Have rubber bottoms. Are comfortable and fit you well. Are closed at the toe. Do not wear sandals. If you use a stepladder: Make sure that it is fully opened. Do not climb a closed stepladder. Make sure that both sides of the stepladder are locked into place. Ask someone to hold it for you, if possible. Clearly mark and make sure that you can see: Any grab bars or handrails. First and last steps. Where the edge of each step is. Use tools that help you move around (mobility aids) if they are needed. These include: Canes. Walkers. Scooters. Crutches. Turn on the lights when you go into a dark area. Replace any light bulbs as soon as they burn out. Set up your furniture so you have a clear path. Avoid moving your furniture around. If any of your floors are uneven, fix them. If there are any pets around you, be aware of where they are. Review your medicines with your doctor. Some medicines can make you feel dizzy. This can increase your chance of falling. Ask your doctor what other things that you can do to help prevent falls. This information is not intended to replace advice given to you by your health care provider. Make sure you discuss any questions you have with your health care provider. Document Released: 09/05/2009 Document Revised: 04/16/2016 Document Reviewed: 12/14/2014 Elsevier Interactive Patient Education  2017 [employer].

## 2022-12-08 ENCOUNTER — Ambulatory Visit (INDEPENDENT_AMBULATORY_CARE_PROVIDER_SITE_OTHER): Payer: Medicare PPO

## 2022-12-08 VITALS — BP 136/74 | Ht 69.0 in | Wt 265.0 lb

## 2022-12-08 DIAGNOSIS — Z0000 Encounter for general adult medical examination without abnormal findings: Secondary | ICD-10-CM

## 2022-12-17 ENCOUNTER — Ambulatory Visit: Payer: Medicare PPO | Admitting: Internal Medicine

## 2022-12-22 ENCOUNTER — Encounter: Payer: Self-pay | Admitting: Family Medicine

## 2022-12-22 ENCOUNTER — Ambulatory Visit: Payer: Medicare PPO | Admitting: Family Medicine

## 2022-12-22 VITALS — BP 128/72 | HR 78 | Temp 98.0°F | Ht 69.0 in | Wt 265.0 lb

## 2022-12-22 DIAGNOSIS — Z1211 Encounter for screening for malignant neoplasm of colon: Secondary | ICD-10-CM | POA: Diagnosis not present

## 2022-12-22 DIAGNOSIS — M2550 Pain in unspecified joint: Secondary | ICD-10-CM | POA: Diagnosis not present

## 2022-12-22 NOTE — Progress Notes (Signed)
Subjective:    Patient ID: Erik Hancock, male    DOB: 16-Jan-1951, 72 y.o.   MRN: [id number]  HPI  Patient reports pain in the joints of his hand.  The pain is primarily in the DIP joints and the PIP joints states that his hands are very stiff in the morning.  Whenever he bends to his knuckles, he can feel a cracking and popping.  They cause him significant pain.  He is unable to take NSAIDs due to the fact he is on Brilinta as well as aspirin.  He is requesting a referral to a rheumatologist Past Medical History:  Diagnosis Date   Arthritis    "all over" (07/29/2018)   CAD S/P percutaneous coronary angioplasty    a. stent to mLAD 11/2001. b. DES to PDA/mLCx 2007. c. DES to ostial LAD 07/2018.   Chronic lower back pain    CKD (chronic kidney disease), stage III (HCC)    CKD (chronic kidney disease), stage III (HCC)    Diabetic retinopathy (HCC)    Diabetic retinopathy (HCC)    GERD (gastroesophageal reflux disease)    High cholesterol    History of kidney stones    Hypertension    Mobitz type 2 second degree atrioventricular block    a. noted during 07/2018 adm, metoprolol discontinued.   Morbid obesity ([place]) 07/28/2018   Prostatitis    Pulmonary nodule 2013   CT   Sleep apnea    stopbang=5   Suspected sleep apnea    Type II diabetes mellitus (HCC)    Past Surgical History:  Procedure Laterality Date   CARDIAC CATHETERIZATION  JUNE 2003   PATENT LAD STENT/ BODERLINE OBSTRUCTIVE DISEASE POSTERIOR DESCENDING ARTERY/ NORMAL LVF   CATARACT EXTRACTION W/ INTRAOCULAR LENS  IMPLANT, BILATERAL Bilateral 2017   CERVICAL SCOVILLE FORAMINOTOMY W/ EXCISION OF HERNIATED NUCLEC PULPOSUS  2009   C6 - 7   CORONARY ANGIOPLASTY WITH STENT PLACEMENT  03/30/2006   DR EDMUNDS - 90% LCx@OM2  TAXUS  DES 3.0 X 20 -> 3.25 mm,   95% RPDA -- TAXUS DES 3.0 X 16 --> 3.5 mm   CORONARY ANGIOPLASTY WITH STENT PLACEMENT  11/2001   (Dr. Ilda Foil for Dr. Radford Pax) - mLAD@D2  - TAXUS EXPRESS DES 3.5 x 15    CORONARY  ANGIOPLASTY WITH STENT PLACEMENT  07/29/2018   CORONARY STENT INTERVENTION N/A 07/29/2018   Procedure: CORONARY STENT INTERVENTION;  Surgeon: Leonie Man, MD;  Location: [facility];  Service: Cardiovascular;  Laterality: N/A;   CYSTOSCOPY W/ RETROGRADES  05/04/2012   Procedure: CYSTOSCOPY WITH RETROGRADE PYELOGRAM;  Surgeon: Molli Hazard, MD;  Location: [facility];  Service: Urology;  Laterality: Left;   CYSTOSCOPY W/ URETERAL STENT PLACEMENT  05/04/2012   Procedure: CYSTOSCOPY WITH STENT REPLACEMENT;  Surgeon: Molli Hazard, MD;  Location: [facility];  Service: Urology;  Laterality: Left;   CYSTOSCOPY WITH STENT PLACEMENT Left 04/04/2012   CYSTOSCOPY WITH URETEROSCOPY  05/04/2012   Procedure: CYSTOSCOPY WITH URETEROSCOPY;  Surgeon: Molli Hazard, MD;  Location: [facility];  Service: Urology;  Laterality: Left;       I & D EXTREMITY Right 02/11/2022   Procedure: IRRIGATION AND DEBRIDEMENT  OF HAND AND FOREARM;  Surgeon: Iran Planas, MD;  Location: [facility];  Service: Orthopedics;  Laterality: Right;   LEFT HEART CATH AND CORONARY ANGIOGRAPHY N/A 07/29/2018   Procedure: LEFT HEART CATH AND CORONARY ANGIOGRAPHY;  Surgeon: Leonie Man, MD;  Location: [facility];  Service: Cardiovascular;  Laterality: N/A;   LEFT URETEROSCOPIC STONE EXTRACTION  03-14-2003   X2   PARTIAL KNEE ARTHROPLASTY Left 06/02/2022   Procedure: UNICOMPARTMENTAL KNEE;  Surgeon: Renette Butters, MD;  Location: [facility];  Service: Orthopedics;  Laterality: Left;   Current Outpatient Medications on File Prior to Visit  Medication Sig Dispense Refill   acetaminophen (TYLENOL) 500 MG tablet Take 2 tablets (1,000 mg total) by mouth every 6 (six) hours as needed for mild pain or moderate pain. 60 tablet 0   aspirin 81 MG tablet Take 81 mg by mouth daily.     Insulin Pen Needle 32G X 4 MM MISC Use 4x a day 300 each 3   LANTUS SOLOSTAR 100 UNIT/ML Solostar Pen SMARTSIG:24 Unit(s) SUB-Q Every Night      nitroGLYCERIN (NITROLINGUAL) 0.4 MG/SPRAY spray Place 1 spray under the tongue every 5 (five) minutes x 3 doses as needed for chest pain. 12 g 12   OZEMPIC, 0.25 OR 0.5 MG/DOSE, 2 MG/3ML SOPN INJECT 0.5MG  UNDER THE SKIN WEEKLY 9 mL 0   pantoprazole (PROTONIX) 40 MG tablet Take 1 tablet by mouth once daily 90 tablet 1   rosuvastatin (CRESTOR) 10 MG tablet Take 1 tablet by mouth once daily 90 tablet 0   spironolactone (ALDACTONE) 25 MG tablet Take 1 tablet (25 mg total) by mouth daily. 90 tablet 3   ticagrelor (BRILINTA) 60 MG TABS tablet Take 1 tablet by mouth twice daily 180 tablet 1   TRESIBA FLEXTOUCH 200 UNIT/ML FlexTouch Pen Inject 24 Units into the skin daily. 9 mL 1   valsartan (DIOVAN) 320 MG tablet Take 1 tablet (320 mg total) by mouth daily. 90 tablet 3   Continuous Blood Gluc Sensor (FREESTYLE LIBRE 2 SENSOR) MISC 1 Device by Does not apply route every 14 (fourteen) days. (Patient not taking: Reported on 12/22/2022) 6 each 3   glucose blood (ACCU-CHEK AVIVA PLUS) test strip USE 1 STRIP TO CHECK GLUCOSE 2 TO 3 TIMES DAILY . APPOINTMENT REQUIRED FOR FUTURE REFILLS (Patient not taking: Reported on 12/22/2022) 100 each 3   No current facility-administered medications on file prior to visit.   Allergies  Allergen Reactions   Beta Adrenergic Blockers     Metoprolol stopped 07/2018 due to 2nd degree type 2 AV block.   Social History   Socioeconomic History   Marital status: Married    Spouse name: Not on file   Number of children: Not on file   Years of education: Not on file   Highest education level: Not on file  Occupational History   Not on file  Tobacco Use   Smoking status: Never   Smokeless tobacco: Never  Vaping Use   Vaping Use: Never used  Substance and Sexual Activity   Alcohol use: Never   Drug use: Never   Sexual activity: Not Currently  Other Topics Concern   Not on file  Social History Narrative   Right handed    Lives with wife    Social Determinants of  Health   Financial Resource Strain: Low Risk  (12/08/2022)   Overall Financial Resource Strain (CARDIA)    Difficulty of Paying Living Expenses: Not hard at all  Food Insecurity: No Food Insecurity (12/08/2022)   Hunger Vital Sign    Worried About Running Out of Food in the Last Year: Never true    [place] in the Last Year: Never true  Transportation Needs: No Transportation Needs (12/08/2022)   [place] -  Hydrologist (Medical): No    Lack of Transportation (Non-Medical): No  Physical Activity: Inactive (12/08/2022)   Exercise Vital Sign    Days of Exercise per Week: 0 days    Minutes of Exercise per Session: 0 min  Stress: No Stress Concern Present (12/08/2022)   [place]    Feeling of Stress : Not at all  Social Connections: Moderately Integrated (12/08/2022)   Social Connection and Isolation Panel [NHANES]    Frequency of Communication with Friends and Family: More than three times a week    Frequency of Social Gatherings with Friends and Family: Three times a week    Attends Religious Services: 1 to 4 times per year    Active Member of Clubs or Organizations: No    Attends Archivist Meetings: Never    Marital Status: Married  Human resources officer Violence: Not At Risk (12/08/2022)   Humiliation, Afraid, Rape, and Kick questionnaire    Fear of Current or Ex-Partner: No    Emotionally Abused: No    Physically Abused: No    Sexually Abused: No     Review of Systems  All other systems reviewed and are negative.      Objective:   Physical Exam Vitals reviewed.  Constitutional:      Appearance: Normal appearance. He is obese.  Cardiovascular:     Rate and Rhythm: Normal rate and regular rhythm.     Heart sounds: Normal heart sounds.  Pulmonary:     Effort: Pulmonary effort is normal.     Breath sounds: Normal breath sounds.  Musculoskeletal:        General:  Tenderness present. No deformity.     Right hand: Tenderness present. No swelling or deformity. Decreased range of motion. Normal strength. Normal sensation.     Left hand: Tenderness present. No swelling or deformity. Decreased range of motion. Normal strength. Normal sensation.  Neurological:     Mental Status: He is alert.           Assessment & Plan:  Polyarthralgia - Plan: CBC with Differential/Platelet, COMPLETE METABOLIC PANEL WITH GFR, Rheumatoid factor, Sedimentation rate, ANA  Colon cancer screening - Plan: Cologuard I suspect osteoarthritis.  Recommend evaluating for autoimmune disease by checking a sed rate, rheumatoid factor, and ANA.  If lab work is normal, would recommend treatment for osteoarthritis by using Voltaren gel 3 times a day for 2 weeks to see if it improves.  Start screening for colon cancer with Cologuard

## 2022-12-24 LAB — COMPLETE METABOLIC PANEL WITH GFR
AG Ratio: 1.5 (calc) (ref 1.0–2.5)
ALT: 27 U/L (ref 9–46)
AST: 20 U/L (ref 10–35)
Albumin: 4.1 g/dL (ref 3.6–5.1)
Alkaline phosphatase (APISO): 83 U/L (ref 35–144)
BUN: 15 mg/dL (ref 7–25)
CO2: 21 mmol/L (ref 20–32)
Calcium: 8.8 mg/dL (ref 8.6–10.3)
Chloride: 108 mmol/L (ref 98–110)
Creat: 1.07 mg/dL (ref 0.70–1.28)
Globulin: 2.7 g/dL (calc) (ref 1.9–3.7)
Glucose, Bld: 106 mg/dL — ABNORMAL HIGH (ref 65–99)
Potassium: 4.1 mmol/L (ref 3.5–5.3)
Sodium: 141 mmol/L (ref 135–146)
Total Bilirubin: 0.7 mg/dL (ref 0.2–1.2)
Total Protein: 6.8 g/dL (ref 6.1–8.1)
eGFR: 74 mL/min/{1.73_m2} (ref >=60)

## 2022-12-24 LAB — CBC WITH DIFFERENTIAL/PLATELET
Absolute Monocytes: 655 cells/uL (ref 200–950)
Basophils Absolute: 31 cells/uL (ref 0–200)
Basophils Relative: 0.4 %
Eosinophils Absolute: 108 cells/uL (ref 15–500)
Eosinophils Relative: 1.4 %
HCT: 40.3 % (ref 38.5–50.0)
Hemoglobin: 13.5 g/dL (ref 13.2–17.1)
Lymphs Abs: 732 cells/uL — ABNORMAL LOW (ref 850–3900)
MCH: 28.9 pg (ref 27.0–33.0)
MCHC: 33.5 g/dL (ref 32.0–36.0)
MCV: 86.3 fL (ref 80.0–100.0)
MPV: 8.6 fL (ref 7.5–12.5)
Monocytes Relative: 8.5 %
Neutro Abs: 6175 cells/uL (ref 1500–7800)
Neutrophils Relative %: 80.2 %
Platelets: 250 10*3/uL (ref 140–400)
RBC: 4.67 10*6/uL (ref 4.20–5.80)
RDW: 14.1 % (ref 11.0–15.0)
Total Lymphocyte: 9.5 %
WBC: 7.7 10*3/uL (ref 3.8–10.8)

## 2022-12-24 LAB — ANTI-NUCLEAR AB-TITER (ANA TITER): ANA Titer 1: 1:80 {titer} — ABNORMAL HIGH

## 2022-12-24 LAB — RHEUMATOID FACTOR: Rheumatoid fact SerPl-aCnc: <14 IU/mL (ref <14)

## 2022-12-24 LAB — ANA: Anti Nuclear Antibody (ANA): POSITIVE — AB

## 2022-12-24 LAB — SEDIMENTATION RATE: Sed Rate: 6 mm/h (ref 0–20)

## 2022-12-28 ENCOUNTER — Other Ambulatory Visit: Payer: Self-pay | Admitting: Family Medicine

## 2023-01-15 DIAGNOSIS — Z1211 Encounter for screening for malignant neoplasm of colon: Secondary | ICD-10-CM | POA: Diagnosis not present

## 2023-01-19 LAB — COLOGUARD: Cologuard: NEGATIVE

## 2023-01-24 LAB — COLOGUARD: COLOGUARD: NEGATIVE

## 2023-02-10 ENCOUNTER — Encounter: Payer: Self-pay | Admitting: Family Medicine

## 2023-02-23 ENCOUNTER — Other Ambulatory Visit: Payer: Self-pay | Admitting: Internal Medicine

## 2023-02-23 DIAGNOSIS — N1831 Chronic kidney disease, stage 3a: Secondary | ICD-10-CM

## 2023-03-02 ENCOUNTER — Telehealth: Payer: Self-pay | Admitting: Internal Medicine

## 2023-03-02 DIAGNOSIS — Z794 Long term (current) use of insulin: Secondary | ICD-10-CM

## 2023-03-03 MED ORDER — OZEMPIC (0.25 OR 0.5 MG/DOSE) 2 MG/3ML ~~LOC~~ SOPN: PEN_INJECTOR | SUBCUTANEOUS | 0 refills | Status: DC

## 2023-03-03 NOTE — Telephone Encounter (Signed)
Rx sent 

## 2023-03-03 NOTE — Telephone Encounter (Signed)
Patient is now scheduled for a F/U on 03/17/23 - requesting RX for Ozempic be sent to [facility]

## 2023-03-05 ENCOUNTER — Encounter (HOSPITAL_BASED_OUTPATIENT_CLINIC_OR_DEPARTMENT_OTHER): Payer: Self-pay | Admitting: Cardiovascular Disease

## 2023-03-05 ENCOUNTER — Ambulatory Visit (HOSPITAL_BASED_OUTPATIENT_CLINIC_OR_DEPARTMENT_OTHER): Payer: Medicare PPO | Admitting: Cardiovascular Disease

## 2023-03-05 VITALS — BP 152/70 | HR 72 | Ht 69.0 in | Wt 284.6 lb

## 2023-03-05 DIAGNOSIS — G4733 Obstructive sleep apnea (adult) (pediatric): Secondary | ICD-10-CM

## 2023-03-05 DIAGNOSIS — I440 Atrioventricular block, first degree: Secondary | ICD-10-CM

## 2023-03-05 DIAGNOSIS — E7800 Pure hypercholesterolemia, unspecified: Secondary | ICD-10-CM | POA: Diagnosis not present

## 2023-03-05 DIAGNOSIS — R079 Chest pain, unspecified: Secondary | ICD-10-CM | POA: Diagnosis not present

## 2023-03-05 DIAGNOSIS — R0602 Shortness of breath: Secondary | ICD-10-CM

## 2023-03-05 DIAGNOSIS — I10 Essential (primary) hypertension: Secondary | ICD-10-CM

## 2023-03-05 DIAGNOSIS — I2510 Atherosclerotic heart disease of native coronary artery without angina pectoris: Secondary | ICD-10-CM | POA: Diagnosis not present

## 2023-03-05 MED ORDER — FUROSEMIDE 20 MG PO TABS: ORAL_TABLET | ORAL | 1 refills | Status: DC

## 2023-03-05 MED ORDER — OZEMPIC (1 MG/DOSE) 4 MG/3ML ~~LOC~~ SOPN: 1.0000 mg | PEN_INJECTOR | SUBCUTANEOUS | 5 refills | Status: DC

## 2023-03-05 NOTE — Patient Instructions (Signed)
Medication Instructions:  START FUROSEMIDE 40 MG DAILY FOR 3 DAYS AND THEN DECREASE TO 20 MG DAILY   INCREASE YOUR OZEMPIC TO 1 MG WEEKLY   *If you need a refill on your cardiac medications before your next appointment, please call your pharmacy*  Lab Work: BNP TODAY   If you have labs (blood work) drawn today and your tests are completely normal, you will receive your results only by: MyChart Message (if you have MyChart) OR A paper copy in the mail If you have any lab test that is abnormal or we need to change your treatment, we will call you to review the results.  Testing/Procedures: Your physician has requested that you have an echocardiogram. Echocardiography is a painless test that uses sound waves to create images of your heart. It provides your doctor with information about the size and shape of your heart and how well your heart's chambers and valves are working. This procedure takes approximately one hour. There are no restrictions for this procedure. Please do NOT wear cologne, perfume, aftershave, or lotions (deodorant is allowed). Please arrive 15 minutes prior to your appointment time.   Your physician has requested that you have a lexiscan myoview. For further information please visit [email or web address] Please follow instruction sheet, as given.  Follow-Up: At [facility], you and your health needs are our priority.  As part of our continuing mission to provide you with exceptional heart care, we have created designated Provider Care Teams.  These Care Teams include your primary Cardiologist (physician) and Advanced Practice Providers (APPs -  Physician Assistants and Nurse Practitioners) who all work together to provide you with the care you need, when you need it.  We recommend signing up for the patient portal called "MyChart".  Sign up information is provided on this After Visit Summary.  MyChart is used to connect with patients for Virtual Visits  (Telemedicine).  Patients are able to view lab/test results, encounter notes, upcoming appointments, etc.  Non-urgent messages can be sent to your provider as well.   To learn more about what you can do with MyChart, go to [email or web address].    Your next appointment:   1 month(s)  Provider:   Gillian Shields, NP

## 2023-03-05 NOTE — Progress Notes (Signed)
Cardiology Office Note   Date:  03/05/2023   ID:  Erik Hancock, DOB [birth year], MRN [id number]  PCP:  Donita Brooks, MD  Cardiologist:   Chilton Si, MD   No chief complaint on file.   History of Present Illness: Erik Hancock is a 72 y.o. male with CAD s/p PCI, mild descending aortic aneurysm, OSA on CPAP, diabetes, hypertension and hyperlipidemia here for follow-up.  He initially established care 07/2018.  At that appointment he reported increasing exertional dyspnea.  Symptoms were progressive over the preceding year.  He had no chest pain but did get discomfort in his left arm.  This was similar to when he previously required coronary stenting.  In 2012 Mr. Bumgarner had angina and underwent PCI of the LAD.  He had no other CAD.   He had an echo 01/2014 that revealed LVEF 60-65% and an moderately dilated RV.  He was referred for left heart catheterization.  At that time he was found to have three-vessel CAD with stents in LAD, left circumflex, and PDA.  He was noted to have a 95% ostial LAD lesion that was successfully treated with scoring balloon angioplasty and implantation of an Orsiro 3.5x13 drug-eluting stent.  Mr. Eley had a sleep study that was positive.  It was felt that he would benefit more from a BiPAP but he declined.   Mr. Maney followed up with Corine Shelter, PA on 02/2020 and reported occasional atypical chest pain.  Ticagrelor was reduced to 60 mg twice daily. At his appointment on 08/2021, he continued to report atypical chest pain. He underwent a nuclear stress test 09/2021 that was low risk with LVEF 56%. Echo performed 09/2021 revealed grade 1 diastolic dysfunction and his ascending aorta was 3.8cm. He had a home sleep study and CPAP was recommended.   He was started on valsartan due to poorly controlled blood pressures. He had exertional dyspnea that was thought to be due to obesity and deconditioning. We encouraged exercise and weight loss. He had ABIs 01/2022  and his arteries were non-compressible. He also had an echo at that time with LVEF 60-65% and moderate LVH. Ascending aorta was 3.8 cm and RA pressure was 8 mmHg. He had a left knee replacement 05/2022.  Blood pressure remained uncontrolled at his visit 06/2022.  He was encouraged to start his Ozempic for both diabetes and weight loss.  Spironolactone was added to his regimen.  He is on able to tolerate nodal agents due to beta-blockers or thiazide diuretics due to hypokalemia.  Given his ankle edema, amlodipine was not added.  Mr. Loescher reports experiencing shortness of breath at times, which has been ongoing for over a year and has remained stable. He also mentions having gained weight since his knee was replaced. The patient has a history of arthritis in their hands and a knee replacement that continues to cause pain and weakness in the affected leg.  The patient describes an episode of chest pain that occurred a month ago, characterized as an aching pain that did not last long and was not accompanied by shortness of breath.  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 The patient has noticed swelling in his legs and feet and experiences shortness of breath when lying in bed at night, which has persisted for over a year. They report poor sleep quality but do not wake up gasping for air and do not use a CPAP machine. The  patient consumes minimal caffeine, no alcohol, and occasionally checks their blood pressure at home.  The patient is currently taking Ozempic at a dose of 0.5 milligrams once a week, which he reports causes increased urination.  However he continues to gain weight.    Past Medical History:  Diagnosis Date   Arthritis    "all over" (07/29/2018)   CAD S/P percutaneous coronary angioplasty    a. stent to mLAD 11/2001. b. DES to PDA/mLCx 2007. c. DES to ostial LAD 07/2018.   Chronic lower back pain    CKD (chronic kidney disease),  stage III    CKD (chronic kidney disease), stage III    Diabetic retinopathy    Diabetic retinopathy    GERD (gastroesophageal reflux disease)    High cholesterol    History of kidney stones    Hypertension    Mobitz type 2 second degree atrioventricular block    a. noted during 07/2018 adm, metoprolol discontinued.   Morbid obesity 07/28/2018   Prostatitis    Pulmonary nodule 2013   CT   Sleep apnea    stopbang=5   Suspected sleep apnea    Type II diabetes mellitus     Past Surgical History:  Procedure Laterality Date   CARDIAC CATHETERIZATION  JUNE 2003   PATENT LAD STENT/ BODERLINE OBSTRUCTIVE DISEASE POSTERIOR DESCENDING ARTERY/ NORMAL LVF   CATARACT EXTRACTION W/ INTRAOCULAR LENS  IMPLANT, BILATERAL Bilateral 2017   CERVICAL SCOVILLE FORAMINOTOMY W/ EXCISION OF HERNIATED NUCLEC PULPOSUS  2009   C6 - 7   CORONARY ANGIOPLASTY WITH STENT PLACEMENT  03/30/2006   DR EDMUNDS - 90% LCx@OM2  TAXUS  DES 3.0 X 20 -> 3.25 mm,   95% RPDA -- TAXUS DES 3.0 X 16 --> 3.5 mm   CORONARY ANGIOPLASTY WITH STENT PLACEMENT  11/2001   (Dr. Ty Hilts for Dr. Mayford Knife) - mLAD@D2  - TAXUS EXPRESS DES 3.5 x 15    CORONARY ANGIOPLASTY WITH STENT PLACEMENT  07/29/2018   CORONARY STENT INTERVENTION N/A 07/29/2018   Procedure: CORONARY STENT INTERVENTION;  Surgeon: Marykay Lex, MD;  Location: [facility];  Service: Cardiovascular;  Laterality: N/A;   CYSTOSCOPY W/ RETROGRADES  05/04/2012   Procedure: CYSTOSCOPY WITH RETROGRADE PYELOGRAM;  Surgeon: Milford Cage, MD;  Location: [facility];  Service: Urology;  Laterality: Left;   CYSTOSCOPY W/ URETERAL STENT PLACEMENT  05/04/2012   Procedure: CYSTOSCOPY WITH STENT REPLACEMENT;  Surgeon: Milford Cage, MD;  Location: [facility];  Service: Urology;  Laterality: Left;   CYSTOSCOPY WITH STENT PLACEMENT Left 04/04/2012   CYSTOSCOPY WITH URETEROSCOPY  05/04/2012   Procedure: CYSTOSCOPY WITH URETEROSCOPY;  Surgeon: Milford Cage, MD;  Location: [facility];  Service: Urology;  Laterality: Left;       I & D EXTREMITY Right 02/11/2022   Procedure: IRRIGATION AND DEBRIDEMENT  OF HAND AND FOREARM;  Surgeon: Bradly Bienenstock, MD;  Location: [facility];  Service: Orthopedics;  Laterality: Right;   LEFT HEART CATH AND CORONARY ANGIOGRAPHY N/A 07/29/2018   Procedure: LEFT HEART CATH AND CORONARY ANGIOGRAPHY;  Surgeon: Marykay Lex, MD;  Location: [facility];  Service: Cardiovascular;  Laterality: N/A;   LEFT URETEROSCOPIC STONE EXTRACTION  03-14-2003   X2   PARTIAL KNEE ARTHROPLASTY Left 06/02/2022   Procedure: UNICOMPARTMENTAL KNEE;  Surgeon: Sheral Apley, MD;  Location: [facility];  Service: Orthopedics;  Laterality: Left;     Current Outpatient Medications  Medication Sig Dispense Refill   acetaminophen (TYLENOL) 500 MG tablet  Take 2 tablets (1,000 mg total) by mouth every 6 (six) hours as needed for mild pain or moderate pain. 60 tablet 0   aspirin 81 MG tablet Take 81 mg by mouth daily.     furosemide (LASIX) 20 MG tablet TAKE 2 TABLETS DAILY FOR 3 DAYS AND THEN DECREASE TO 1 TABLET DAILY 90 tablet 1   Insulin Aspart FlexPen (NOVOLOG) 100 UNIT/ML SMARTSIG:30 Unit(s) SUB-Q Twice Daily     Insulin Pen Needle 32G X 4 MM MISC Use 4x a day 300 each 3   nitroGLYCERIN (NITROLINGUAL) 0.4 MG/SPRAY spray Place 1 spray under the tongue every 5 (five) minutes x 3 doses as needed for chest pain. 12 g 12   pantoprazole (PROTONIX) 40 MG tablet Take 1 tablet by mouth once daily 90 tablet 1   rosuvastatin (CRESTOR) 10 MG tablet Take 1 tablet by mouth once daily 60 tablet 0   Semaglutide, 1 MG/DOSE, (OZEMPIC, 1 MG/DOSE,) 4 MG/3ML SOPN Inject 1 mg into the skin once a week. 1 mL 5   spironolactone (ALDACTONE) 25 MG tablet Take 1 tablet (25 mg total) by mouth daily. 90 tablet 3   ticagrelor (BRILINTA) 60 MG TABS tablet Take 1 tablet by mouth twice daily 180 tablet 1   TRESIBA FLEXTOUCH 200 UNIT/ML FlexTouch Pen Inject 24 Units into the skin daily. 9 mL 1    valsartan (DIOVAN) 320 MG tablet Take 1 tablet (320 mg total) by mouth daily. 90 tablet 3   No current facility-administered medications for this visit.    Allergies:   Beta adrenergic blockers    Social History:  The patient  reports that he has never smoked. He has never used smokeless tobacco. He reports that he does not drink alcohol and does not use drugs.   Family History:  The patient's family history includes Alzheimer's disease in his father; Diabetes in his father; Heart attack in his father and paternal grandfather; Heart disease in his father and paternal grandfather; Hypertension in his mother; Lupus in his sister.    ROS:   Please see the history of present illness.    (+) Difficulty with right hand ambulation/weakness (+) Right hand/finger coldness (+) LE pain while walking, L>R All other systems are reviewed and negative.    PHYSICAL EXAM: VS:  BP (!) 152/70 (BP Location: Left Arm, Patient Position: Sitting, Cuff Size: Large)   Pulse 72   Ht 5\' 9"  (1.753 m)   Wt 284 lb 9.6 oz (129.1 kg)   BMI 42.03 kg/m  , BMI Body mass index is 42.03 kg/m. GENERAL:  Well appearing HEENT: Pupils equal round and reactive, fundi not visualized, oral mucosa unremarkable NECK:  No jugular venous distention, waveform within normal limits, carotid upstroke brisk and symmetric, no bruits, no thyromegaly LUNGS:  Clear to auscultation bilaterally HEART:  RRR.  PMI not displaced or sustained,S1 and S2 within normal limits, no S3, no S4, no clicks, no rubs, no murmurs ABD:  Flat, positive bowel sounds normal in frequency in pitch, no bruits, no rebound, no guarding, no midline pulsatile mass, no hepatomegaly, no splenomegaly EXT:  1+ DP/TP pulses, 1+ bilateral ankle edema, no cyanosis no clubbing SKIN:  No rashes no nodules NEURO:  Cranial nerves II through XII grossly intact, motor grossly intact throughout PSYCH:  Cognitively intact, oriented to person place and time  EKG:   EKG is  personally reviewed. 03/05/23: Sinus rhyyhm rate 72 bpm.   07/07/2022:  EKG was not ordered. 09/02/2021: Sinus rhythm.  Rate 65 bpm.  Prior inferior infarct.  Cannot rule out prior anterior infarct.  First-degree AV block. 07/19/18: sinus rhythm at rate 60 bpm.  Prior inferior infarct.  Poor R wave progression.   ABI Doppler  02/12/2022: Summary:  Right: Resting right ankle-brachial index indicates noncompressible right  lower extremity arteries.   Non compressible vessels, normal waveforms.  Left: Resting left ankle-brachial index indicates noncompressible left  lower extremity arteries.   Non compressible vessels, normal waveforms.   Echocardiogram  02/12/2022: Sonographer Comments: Image acquisition challenging due to patient body habitus.  IMPRESSIONS    1. Left ventricular ejection fraction, by estimation, is 60 to 65%. The  left ventricle has normal function. The left ventricle has no regional  wall motion abnormalities. There is moderate concentric left ventricular  hypertrophy. Left ventricular  diastolic parameters are indeterminate.   2. Right ventricular systolic function is normal. The right ventricular  size is normal. There is normal pulmonary artery systolic pressure.   3. Left atrial size was mildly dilated.   4. The mitral valve is normal in structure. No evidence of mitral valve  regurgitation. No evidence of mitral stenosis.   5. The aortic valve is tricuspid. There is mild calcification of the  aortic valve. There is mild thickening of the aortic valve. Aortic valve  regurgitation is not visualized. No aortic stenosis is present.   6. Aortic dilatation noted. There is mild dilatation of the ascending  aorta, measuring 38 mm.   7. The inferior vena cava is dilated in size with >50% respiratory  variability, suggesting right atrial pressure of 8 mmHg.  Lexiscan Myocar 09/24/21 Low risk stress nuclear study with normal perfusion and normal left ventricular regional  and global systolic function.  Echo 09/23/21 1. Left ventricular ejection fraction, by estimation, is 55 to 60%. The  left ventricle has normal function. The left ventricle has no regional  wall motion abnormalities. There is mild left ventricular hypertrophy.  Left ventricular diastolic parameters  are consistent with Grade I diastolic dysfunction (impaired relaxation).   2. Right ventricular systolic function is normal. The right ventricular  size is normal. Tricuspid regurgitation signal is inadequate for assessing  PA pressure.   3. The mitral valve is normal in structure. No evidence of mitral valve  regurgitation. No evidence of mitral stenosis.   4. The aortic valve is tricuspid. Aortic valve regurgitation is not  visualized. Mild aortic valve sclerosis is present, with no evidence of  aortic valve stenosis.   5. Aortic dilatation noted. There is mild dilatation of the ascending  aorta, measuring 38 mm.   6. The inferior vena cava is normal in size with greater than 50%  respiratory variability, suggesting right atrial pressure of 3 mmHg.   LHC 07/29/18: Ost LAD lesion is 85% stenosed -just prior to previous stent. Previously placed prox LAD DES stent is 5% stenosed. Following scoring balloon angioplasty, A drug-eluting stent was successfully placed overlapping the previous stent proximally, using a STENT ORSIRO 3.5X13 --postdilated to 4.0 mm Post intervention, there is a 0% residual stenosis. Previously placed Prox Cx to Dist Cx stent (DES) is widely patent. Ost 2nd Mrg lesion is 40% stenosed. Ost Cx to Prox Cx lesion is 30% stenosed. Previously placed RPDA-1 stent (DES) is widely patent. RPDA-2 lesion is 40% stenosed just beyond stent. The left ventricular systolic function is normal. The left ventricular ejection fraction is 55-65% by visual estimate. LV end diastolic pressure is normal. Post intervention, there is a 5% residual  stenosis. A drug-eluting stent was successfully  placed using a STENT ORSIRO 3.5X13.   Three-vessel CAD with stents in the LAD, circumflex and PDA. Culprit lesion is severe 95% proximal edge lesion and ostial LAD -> successfully treated with scoring balloon angioplasty and DES stent overlapping the original stent (STENT ORSIRO 3.5X13 -postdilated to 4.0 mm) Patent stents in circumflex and RPDA Normal LV function with normal LVEDP.  LHC 04/08/11: PROCEDURES PERFORMED: 1. Percutaneous coronary intervention/stent implantation, mid LAD. 2. Percutaneous closure, right femoral artery/Perclose.   INDICATION: The patient is a 72 year old man with diabetes, hypertension, and hypercholesterolemia, who has developed exertional angina. Dr. Mayford Knife has completed coronary angiography revealing a mid LAD stenosis of 80-90% involving a small to moderate sized diagonal branch. He is to undergo percutaneous intervention at this time for definitive revascularization.   Recent Labs: 12/22/2022: ALT 27; BUN 15; Creat 1.07; Hemoglobin 13.5; Platelets 250; Potassium 4.1; Sodium 141    Lipid Panel    Component Value Date/Time   CHOL 115 07/15/2022 0759   TRIG 85 07/15/2022 0759   HDL 55 07/15/2022 0759   CHOLHDL 2.1 07/15/2022 0759   CHOLHDL 2.9 06/26/2021 0921   VLDL 29 05/10/2017 0832   LDLCALC 43 07/15/2022 0759   LDLCALC 71 06/26/2021 0921      Wt Readings from Last 3 Encounters:  03/05/23 284 lb 9.6 oz (129.1 kg)  12/22/22 265 lb (120.2 kg)  12/08/22 265 lb (120.2 kg)      ASSESSMENT AND PLAN:  # Dyspnea and chest pain: # CAD s/p PCI: # OSA- untreated: - Patient reports constant shortness of breath and chest pain that occurred a month ago. The patient experiences dyspnea during physical activities and when lying in bed at night. The chest pain was described as an aching pain that did not last long and was not accompanied by shortness of breath. - Plan: Order an echocardiogram and a Lexiscan Myoview to evaluate cardiac function and  potential ischemia. Prescribe Lasix 40 mg for three days, followed by 20 mg daily to reduce fluid retention and improve breathing.  Check BNP.  Encourage him to get  CPAP.  Continue aspirin, ticagrelor, and rosuvastatin.  He is not on beta-blockers due to bradycardia.  Shared Decision Making/Informed Consent The risks [chest pain, shortness of breath, cardiac arrhythmias, dizziness, blood pressure fluctuations, myocardial infarction, stroke/transient ischemic attack, nausea, vomiting, allergic reaction, radiation exposure, metallic taste sensation and life-threatening complications (estimated to be 1 in 10,000)], benefits (risk stratification, diagnosing coronary artery disease, treatment guidance) and alternatives of a nuclear stress test were discussed in detail with Mr. Kanhai and he agrees to proceed.   # Suspected atrial flutter: # First degree AV block: Initial EKG was concerning for atrial flutter due to concern for P waves at the end of the QRS in lead aVL.  However after performing a rhythm strip and carotid massage, it was noted that he is just in sinus rhythm with a long first-degree AV block.  No medication changes needed.  # Morbid obesity and diabetes management: - Patient reports weight gain since hospitalization and is currently on Ozempic 0.5 mg once a week. - Plan: Increase Ozempic dosage to 1 mg once a week to improve glycemic control and potentially aid in weight loss. Monitor the patient's response to the increased dosage and adjust as needed.  # Hypertension: BP uncontrolled as above.  Diuresis as above.  Continue valsartan and spironolactone.  Echo pending.  Disposition:    FU with PharmD in 1  month. FU with Santi Troung C. Duke Salvia, MD, [facility] in 6 months.     Signed, Rishawn Walck C. Duke Salvia, MD, [facility]  03/05/2023 9:44 AM    [facility]

## 2023-03-08 LAB — BRAIN NATRIURETIC PEPTIDE: BNP: 56.3 pg/mL (ref 0.0–100.0)

## 2023-03-12 ENCOUNTER — Ambulatory Visit (HOSPITAL_BASED_OUTPATIENT_CLINIC_OR_DEPARTMENT_OTHER): Payer: Medicare PPO | Admitting: Family

## 2023-03-18 ENCOUNTER — Ambulatory Visit: Payer: Medicare PPO | Admitting: Internal Medicine

## 2023-03-18 ENCOUNTER — Encounter: Payer: Self-pay | Admitting: Internal Medicine

## 2023-03-18 VITALS — BP 128/68 | HR 69 | Ht 69.0 in | Wt 281.6 lb

## 2023-03-18 DIAGNOSIS — E1165 Type 2 diabetes mellitus with hyperglycemia: Secondary | ICD-10-CM

## 2023-03-18 DIAGNOSIS — Z7985 Long-term (current) use of injectable non-insulin antidiabetic drugs: Secondary | ICD-10-CM | POA: Diagnosis not present

## 2023-03-18 DIAGNOSIS — Z794 Long term (current) use of insulin: Secondary | ICD-10-CM

## 2023-03-18 DIAGNOSIS — E1159 Type 2 diabetes mellitus with other circulatory complications: Secondary | ICD-10-CM

## 2023-03-18 DIAGNOSIS — E785 Hyperlipidemia, unspecified: Secondary | ICD-10-CM

## 2023-03-18 LAB — POCT GLYCOSYLATED HEMOGLOBIN (HGB A1C): Hemoglobin A1C: 7.5 % — AB (ref 4.0–5.6)

## 2023-03-18 MED ORDER — OZEMPIC (1 MG/DOSE) 4 MG/3ML ~~LOC~~ SOPN: 1.0000 mg | PEN_INJECTOR | SUBCUTANEOUS | 3 refills | Status: DC

## 2023-03-18 NOTE — Progress Notes (Signed)
Patient ID: Erik Hancock, male   DOB: [birth year], 72 y.o.   MRN: [id number]  HPI: Erik Hancock is a 72 y.o.-year-old male, returning for follow-up for DM2, dx in 2002, insulin-dependent since 2010, uncontrolled, with complications (CAD - s/p PTCA, CKD stage IIIa, DR). Pt. previously saw Dr. Everardo All, but last visit with me 9 months ago. He is here with his wife who offers part of the history, especially regarding his medications, past medical history, diet, and activity.  Interim history: Before last visit, he just had a left TKR on 06/02/2022.  He initially felt better afterwards, but now he started to have more pain. Since last visit, he gained ~25 pounds. He recently saw his cardiologist and was just started on Lasix.  She also increased his Ozempic dose. He does have increased urination, but no blurry vision, nausea, chest pain.  Reviewed HbA1c: Lab Results  Component Value Date   HGBA1C 6.6 (H) 05/21/2022   HGBA1C 7.3 (A) 03/30/2022   HGBA1C 8.4 (H) 02/11/2022   HGBA1C 8.0 (A) 08/21/2021   HGBA1C 8.8 (A) 05/20/2021   HGBA1C 9.7 (A) 01/28/2021   HGBA1C 7.6 (A) 06/17/2020   HGBA1C 7.7 (A) 03/11/2020   HGBA1C 8.0 (A) 01/10/2020   HGBA1C 7.4 (A) 10/18/2019   At last visit he was on: - Victoza 1.8 mg before breakfast - Lantus 24 units at bedtime - Novolog 30 units 2x a day, after breakfast and at bedtime! He was taken off Jardiance in the hospital in 01/2022. He tried Metformin >> was stopped.  Currently on: - Tresiba 24 units at bedtime - Novolog 20-30 >> 30 units 2x daily 15 min before the meals - Ozempic 0.5 >> 1 mg weekly (increased by Dr. Duke Salvia last week)  Pt. Tried a Libre CGM >> kept coming off.  They were planning to get securing tape to put on top of the sensor, but did not do this since last visit.  Pt checks his sugars occasionally: - am: 60s, 108-233 >> 165, 280 - 2h after b'fast: n/c   - before lunch: n/c - 2h after lunch: n/c - before dinner: n/c - 2h  after dinner: n/c - bedtime: n/c - nighttime: n/c Lowest sugar was 60s >> ? (Low this am - took Novolog and did not eat); he has hypoglycemia awareness at 70.  Highest sugar was 200s >> 280.  Glucometer:AccuChek  - + CKD stage 3a, last BUN/creatinine:  Lab Results  Component Value Date   BUN 15 12/22/2022   BUN 11 07/15/2022   CREATININE 1.07 12/22/2022   CREATININE 1.00 07/15/2022  He is on Diovan 160 mg daily.  -+ HL; last set of lipids: Lab Results  Component Value Date   CHOL 115 07/15/2022   HDL 55 07/15/2022   LDLCALC 43 07/15/2022   TRIG 85 07/15/2022   CHOLHDL 2.1 07/15/2022  On Crestor 10 mg daily.  - last eye exam was on 08/07/2021. + DR reportedly.  Available records are from 2019: + DR at that time. Dr. Ashley Royalty. IO injections.  - no numbness and tingling in his feet.  Last foot exam 08/21/2021.  He also has a history of HTN, kidney stones, GERD.  ROS: + see HPI  Past Medical History:  Diagnosis Date   Arthritis    "all over" (07/29/2018)   CAD S/P percutaneous coronary angioplasty    a. stent to mLAD 11/2001. b. DES to PDA/mLCx 2007. c. DES to ostial LAD 07/2018.   Chronic lower back pain  CKD (chronic kidney disease), stage III    CKD (chronic kidney disease), stage III    Diabetic retinopathy    Diabetic retinopathy    GERD (gastroesophageal reflux disease)    High cholesterol    History of kidney stones    Hypertension    Mobitz type 2 second degree atrioventricular block    a. noted during 07/2018 adm, metoprolol discontinued.   Morbid obesity 07/28/2018   Prostatitis    Pulmonary nodule 2013   CT   Sleep apnea    stopbang=5   Suspected sleep apnea    Type II diabetes mellitus    Past Surgical History:  Procedure Laterality Date   CARDIAC CATHETERIZATION  JUNE 2003   PATENT LAD STENT/ BODERLINE OBSTRUCTIVE DISEASE POSTERIOR DESCENDING ARTERY/ NORMAL LVF   CATARACT EXTRACTION W/ INTRAOCULAR LENS  IMPLANT, BILATERAL Bilateral 2017   CERVICAL  SCOVILLE FORAMINOTOMY W/ EXCISION OF HERNIATED NUCLEC PULPOSUS  2009   C6 - 7   CORONARY ANGIOPLASTY WITH STENT PLACEMENT  03/30/2006   DR EDMUNDS - 90% LCx@OM2  TAXUS  DES 3.0 X 20 -> 3.25 mm,   95% RPDA -- TAXUS DES 3.0 X 16 --> 3.5 mm   CORONARY ANGIOPLASTY WITH STENT PLACEMENT  11/2001   (Dr. Ty Hilts for Dr. Mayford Knife) - mLAD@D2  - TAXUS EXPRESS DES 3.5 x 15    CORONARY ANGIOPLASTY WITH STENT PLACEMENT  07/29/2018   CORONARY STENT INTERVENTION N/A 07/29/2018   Procedure: CORONARY STENT INTERVENTION;  Surgeon: Marykay Lex, MD;  Location: [facility];  Service: Cardiovascular;  Laterality: N/A;   CYSTOSCOPY W/ RETROGRADES  05/04/2012   Procedure: CYSTOSCOPY WITH RETROGRADE PYELOGRAM;  Surgeon: Milford Cage, MD;  Location: [facility];  Service: Urology;  Laterality: Left;   CYSTOSCOPY W/ URETERAL STENT PLACEMENT  05/04/2012   Procedure: CYSTOSCOPY WITH STENT REPLACEMENT;  Surgeon: Milford Cage, MD;  Location: [facility];  Service: Urology;  Laterality: Left;   CYSTOSCOPY WITH STENT PLACEMENT Left 04/04/2012   CYSTOSCOPY WITH URETEROSCOPY  05/04/2012   Procedure: CYSTOSCOPY WITH URETEROSCOPY;  Surgeon: Milford Cage, MD;  Location: [facility];  Service: Urology;  Laterality: Left;       I & D EXTREMITY Right 02/11/2022   Procedure: IRRIGATION AND DEBRIDEMENT  OF HAND AND FOREARM;  Surgeon: Bradly Bienenstock, MD;  Location: [facility];  Service: Orthopedics;  Laterality: Right;   LEFT HEART CATH AND CORONARY ANGIOGRAPHY N/A 07/29/2018   Procedure: LEFT HEART CATH AND CORONARY ANGIOGRAPHY;  Surgeon: Marykay Lex, MD;  [facility];  Service: Cardiovascular;  Laterality: N/A;   LEFT URETEROSCOPIC STONE EXTRACTION  03-14-2003   X2   PARTIAL KNEE ARTHROPLASTY Left 06/02/2022   Procedure: UNICOMPARTMENTAL KNEE;  Surgeon: Sheral Apley, MD;  Location: [facility];  Service: Orthopedics;  Laterality: Left;   Social History   Socioeconomic History   Marital status: Married     Spouse name: Not on file   Number of children: Not on file   Years of education: Not on file   Highest education level: Not on file  Occupational History   Not on file  Tobacco Use   Smoking status: Never   Smokeless tobacco: Never  Vaping Use   Vaping Use: Never used  Substance and Sexual Activity   Alcohol use: Never   Drug use: Never   Sexual activity: Not Currently  Other Topics Concern   Not on file  Social History Narrative   Right handed  Lives with wife    Social Determinants of Health   Financial Resource Strain: Low Risk  (12/08/2022)   Overall Financial Resource Strain (CARDIA)    Difficulty of Paying Living Expenses: Not hard at all  Food Insecurity: No Food Insecurity (12/08/2022)   Hunger Vital Sign    Worried About Running Out of Food in the Last Year: Never true    Ran Out of Food in the Last Year: Never true  Transportation Needs: No Transportation Needs (12/08/2022)   PRAPARE - Administrator, Civil Service (Medical): No    Lack of Transportation (Non-Medical): No  Physical Activity: Inactive (12/08/2022)   Exercise Vital Sign    Days of Exercise per Week: 0 days    Minutes of Exercise per Session: 0 min  Stress: No Stress Concern Present (12/08/2022)   [employer] of Occupational Health - Occupational Stress Questionnaire    Feeling of Stress : Not at all  Social Connections: Moderately Integrated (12/08/2022)   Social Connection and Isolation Panel [NHANES]    Frequency of Communication with Friends and Family: More than three times a week    Frequency of Social Gatherings with Friends and Family: Three times a week    Attends Religious Services: 1 to 4 times per year    Active Member of Clubs or Organizations: No    Attends Banker Meetings: Never    Marital Status: Married  Catering manager Violence: Not At Risk (12/08/2022)   Humiliation, Afraid, Rape, and Kick questionnaire    Fear of Current or Ex-Partner: No     Emotionally Abused: No    Physically Abused: No    Sexually Abused: No   Current Outpatient Medications on File Prior to Visit  Medication Sig Dispense Refill   acetaminophen (TYLENOL) 500 MG tablet Take 2 tablets (1,000 mg total) by mouth every 6 (six) hours as needed for mild pain or moderate pain. 60 tablet 0   aspirin 81 MG tablet Take 81 mg by mouth daily.     furosemide (LASIX) 20 MG tablet TAKE 2 TABLETS DAILY FOR 3 DAYS AND THEN DECREASE TO 1 TABLET DAILY 90 tablet 1   Insulin Aspart FlexPen (NOVOLOG) 100 UNIT/ML SMARTSIG:30 Unit(s) SUB-Q Twice Daily     Insulin Pen Needle 32G X 4 MM MISC Use 4x a day 300 each 3   nitroGLYCERIN (NITROLINGUAL) 0.4 MG/SPRAY spray Place 1 spray under the tongue every 5 (five) minutes x 3 doses as needed for chest pain. 12 g 12   pantoprazole (PROTONIX) 40 MG tablet Take 1 tablet by mouth once daily 90 tablet 1   rosuvastatin (CRESTOR) 10 MG tablet Take 1 tablet by mouth once daily 60 tablet 0   Semaglutide, 1 MG/DOSE, (OZEMPIC, 1 MG/DOSE,) 4 MG/3ML SOPN Inject 1 mg into the skin once a week. 1 mL 5   spironolactone (ALDACTONE) 25 MG tablet Take 1 tablet (25 mg total) by mouth daily. 90 tablet 3   ticagrelor (BRILINTA) 60 MG TABS tablet Take 1 tablet by mouth twice daily 180 tablet 1   TRESIBA FLEXTOUCH 200 UNIT/ML FlexTouch Pen Inject 24 Units into the skin daily. 9 mL 1   valsartan (DIOVAN) 320 MG tablet Take 1 tablet (320 mg total) by mouth daily. 90 tablet 3   No current facility-administered medications on file prior to visit.   Allergies  Allergen Reactions   Beta Adrenergic Blockers     Metoprolol stopped 07/2018 due to 2nd degree type 2 AV  block.   Family History  Problem Relation Age of Onset   Hypertension Mother    Heart attack Father    Heart disease Father    Alzheimer's disease Father    Diabetes Father    Heart attack Paternal Grandfather    Heart disease Paternal Grandfather    Lupus Sister    PE: BP 128/68 (BP Location:  Right Arm, Patient Position: Sitting, Cuff Size: Normal)   Pulse 69   Ht 5\' 9"  (1.753 m)   Wt 281 lb 9.6 oz (127.7 kg)   SpO2 93%   BMI 41.59 kg/m  Wt Readings from Last 3 Encounters:  03/18/23 281 lb 9.6 oz (127.7 kg)  03/05/23 284 lb 9.6 oz (129.1 kg)  12/22/22 265 lb (120.2 kg)   Constitutional: overweight, in NAD Eyes: no exophthalmos ENT: no thyromegaly, no cervical lymphadenopathy Cardiovascular: RRR, No MRG Respiratory: CTA B Musculoskeletal: no deformities Skin: no rashes Neurological: no tremor with outstretched hands Diabetic Foot Exam - Simple   Simple Foot Form Diabetic Foot exam was performed with the following findings: Yes 03/18/2023  8:49 AM  Visual Inspection No deformities, no ulcerations, no other skin breakdown bilaterally: Yes Sensation Testing See comments: Yes Pulse Check Posterior Tibialis and Dorsalis pulse intact bilaterally: Yes Comments R halluceal nail absent. Patchy decrease in sensation to monifilament    ASSESSMENT: 1. DM2, insulin-dependent, uncontrolled, with complications - CAD, s/p PTCA - CKD stage 3a - DR  2. HL  3.  Obesity class III  PLAN:  1. Patient with longstanding, uncontrolled, type 2 diabetes, on injectable antidiabetic regimen with basal/bolus insulin and also weekly GLP-1 receptor agonist, changed from Victoza to Ozempic at last visit.  He was previously on SGLT2 inhibitor Marcelline Deist) but this was stopped in the hospital in 01/2022.  We discussed at last visit about possibly restarting this but his wife was telling me that this was expensive for them and they would prefer to stay off the medication.  At last visit, he was taking NovoLog at bedtime and I advised him to take it 15 min before meals.  I also advised him to change the dose based on the size and consistency of his meals.  At that time, he did not have recent sugar checks but when he checked, sugars were fluctuating in the morning between 60s and 200s.  He tried a CGM  but this kept coming off and they were looking for an adhesive to hopefully keep it in place.  I strongly recommended to stop juice or other sweet drinks.  HbA1c was 6.6% prior to our last visit. -Since last visit, he had to switch from Lantus to [place] due to insurance coverage. -At today's visit, he is still not checking sugars and did not start his CGM.  At today's visit, after discussion about benefits, they agreed to retry this. We sent a prescription for the sensor and receiver to [facility]. -His Ozempic dose was increased by cardiology last week.  He tolerated well.  Will continue the higher dose, which I refilled. -He tells me he takes a fixed dose of NovoLog, 30 units, and he actually took this this morning, and did not eat breakfast.  As a result, he started to feel poorly on the way to the office and his wife gave him candy that was left in the car by his grandchildren.  I strongly advised him to not take NovoLog unless he is planning to eat afterwards.  I advised him never to fast  when he comes to see me.  I also advised him to vary the dose of NovoLog based on the size of his meals, and not to take fixed doses. -He complains of neuropathic pain at night and I recommended alpha-lipoic acid. - I suggested to:  Patient Instructions  STOP JUICE!  DO NOT TAKE NOVOLOG AND SKIP MEALS!  Please continue: - Tresiba 24 units at bedtime - Ozempic 1 mg weekly  Adjust: - Novolog 20-30 units 2x daily 15 min before the meals  Try alpha lipoic acid 600 mg 2x a day for neuropathy.  Please return in 3-4 months.  - we checked his HbA1c: 7.5% (higher) - advised to check sugars at different times of the day - 4x a day, rotating check times - advised for yearly eye exams >> he is not UTD - return to clinic in 3-4 months   2. HL -Reviewed latest lipid panel from 06/2022: All fractions at goal: Lab Results  Component Value Date   CHOL 115 07/15/2022   HDL 55 07/15/2022   LDLCALC 43  07/15/2022   TRIG 85 07/15/2022   CHOLHDL 2.1 07/15/2022  -He continues on Crestor 10 mg daily without side effects  3.  Obesity class III -Unfortunately, he gained ~25 pounds since last visit -She suspects that some of this is fluid.  He was just started on Lasix by cardiology -Also, his dose of Ozempic was increased last week.  This will also help with weight loss.  Carlus Pavlov, MD PhD [facility]

## 2023-03-18 NOTE — Patient Instructions (Addendum)
STOP JUICE!  DO NOT TAKE NOVOLOG AND SKIP MEALS!  Please continue: - Tresiba 24 units at bedtime - Ozempic 1 mg weekly  Adjust: - Novolog 20-30 units 2x daily 15 min before the meals  Try alpha lipoic acid 600 mg 2x a day for neuropathy.  Please return in 3-4 months.

## 2023-03-22 ENCOUNTER — Other Ambulatory Visit: Payer: Self-pay | Admitting: Family Medicine

## 2023-03-26 DIAGNOSIS — E1159 Type 2 diabetes mellitus with other circulatory complications: Secondary | ICD-10-CM | POA: Diagnosis not present

## 2023-04-01 ENCOUNTER — Telehealth (HOSPITAL_COMMUNITY): Payer: Self-pay

## 2023-04-01 NOTE — Telephone Encounter (Signed)
Spoke with the patient's wife, she stated that he would be here for his test and she would give him all the instructions. Asked to call back with any questions. S.Heaven Meeker EMTP/CCT

## 2023-04-06 ENCOUNTER — Ambulatory Visit (HOSPITAL_COMMUNITY): Payer: Medicare PPO | Attending: Internal Medicine

## 2023-04-06 ENCOUNTER — Ambulatory Visit (HOSPITAL_BASED_OUTPATIENT_CLINIC_OR_DEPARTMENT_OTHER): Payer: Medicare PPO

## 2023-04-06 DIAGNOSIS — R0602 Shortness of breath: Secondary | ICD-10-CM

## 2023-04-06 DIAGNOSIS — R079 Chest pain, unspecified: Secondary | ICD-10-CM | POA: Diagnosis not present

## 2023-04-06 LAB — ECHOCARDIOGRAM COMPLETE
Area-P 1/2: 3.17 cm2
S' Lateral: 3.2 cm

## 2023-04-06 MED ORDER — REGADENOSON 0.4 MG/5ML IV SOLN
0.4000 mg | Freq: Once | INTRAVENOUS | Status: AC
Start: 2023-04-06 — End: 2023-04-06
  Administered 2023-04-06: 0.4 mg via INTRAVENOUS

## 2023-04-06 MED ORDER — TECHNETIUM TC 99M TETROFOSMIN IV KIT
31.0000 | PACK | Freq: Once | INTRAVENOUS | Status: AC | PRN
  Administered 2023-04-06: 31 via INTRAVENOUS

## 2023-04-07 ENCOUNTER — Ambulatory Visit (HOSPITAL_COMMUNITY): Payer: Medicare PPO | Attending: Internal Medicine

## 2023-04-07 ENCOUNTER — Other Ambulatory Visit: Payer: Self-pay | Admitting: Family Medicine

## 2023-04-07 ENCOUNTER — Other Ambulatory Visit (HOSPITAL_BASED_OUTPATIENT_CLINIC_OR_DEPARTMENT_OTHER): Payer: Self-pay | Admitting: *Deleted

## 2023-04-07 DIAGNOSIS — I77810 Thoracic aortic ectasia: Secondary | ICD-10-CM

## 2023-04-07 LAB — MYOCARDIAL PERFUSION IMAGING
LV dias vol: 146 mL (ref 62–150)
LV sys vol: 75 mL
Nuc Stress EF: 48 %
Peak HR: 77 {beats}/min
Rest HR: 59 {beats}/min
Rest Nuclear Isotope Dose: 31.9 mCi
SDS: 10
SRS: 3
SSS: 13
ST Depression (mm): 0 mm
Stress Nuclear Isotope Dose: 31 mCi
TID: 0.96

## 2023-04-07 MED ORDER — TECHNETIUM TC 99M TETROFOSMIN IV KIT
31.9000 | PACK | Freq: Once | INTRAVENOUS | Status: AC | PRN
  Administered 2023-04-07: 31.9 via INTRAVENOUS

## 2023-04-07 NOTE — Telephone Encounter (Signed)
Requested medication (s) are due for refill today:   Yes  Requested medication (s) are on the active medication list:   Yes  Future visit scheduled:   No   LOV 12/22/2022   Last ordered: 12/28/2022 #60, 0 refills  Returned because labs are due plus a note that he needs a yearly physical.      Requested Prescriptions  Pending Prescriptions Disp Refills   rosuvastatin (CRESTOR) 10 MG tablet [Pharmacy Med Name: Rosuvastatin Calcium 10 MG Oral Tablet] 60 tablet 0    Sig: TAKE 1 TABLET BY MOUTH ONCE DAILY . APPOINTMENT REQUIRED FOR FUTURE REFILLS     Cardiovascular:  Antilipid - Statins 2 Failed - 04/07/2023  9:36 AM      Failed - Valid encounter within last 12 months    Recent Outpatient Visits           1 year ago Acute pain of left knee   [facility] [name], Priscille Heidelberg, MD   1 year ago Puncture wound   [facility] Tanya Nones, Priscille Heidelberg, MD   1 year ago Encounter for Medicare annual wellness exam   [facility] Donita Brooks, MD   1 year ago Prostate cancer screening   [facility] Donita Brooks, MD   2 years ago Cerebellar cerebrovascular accident (CVA) without late effect   [facility] [name], Priscille Heidelberg, MD       Future Appointments             In 2 days Alver Sorrow, NP [facility] [place]            Failed - Lipid Panel in normal range within the last 12 months    Cholesterol, Total  Date Value Ref Range Status  07/15/2022 115 100 - 199 mg/dL Final   LDL Cholesterol (Calc)  Date Value Ref Range Status  06/26/2021 71 mg/dL (calc) Final    Comment:    Reference range: <100 . Desirable range <100 mg/dL for primary prevention;   <70 mg/dL for patients with CHD or diabetic patients  with > or = 2 CHD risk factors. [name] LDL-C is now calculated using the Martin-Hopkins  calculation, which is a validated novel method providing  better accuracy  than the Friedewald equation in the  estimation of LDL-C.  Horald Pollen et al. Lenox Ahr. 1610;960(45): 2061-2068  (http://education.[facility].com/faq/FAQ164)    LDL Chol Calc (NIH)  Date Value Ref Range Status  07/15/2022 43 0 - 99 mg/dL Final   HDL  Date Value Ref Range Status  07/15/2022 55 >39 mg/dL Final   Triglycerides  Date Value Ref Range Status  07/15/2022 85 0 - 149 mg/dL Final         Passed - Cr in normal range and within 360 days    Creat  Date Value Ref Range Status  12/22/2022 1.07 0.70 - 1.28 mg/dL Final         Passed - Patient is not pregnant

## 2023-04-09 ENCOUNTER — Encounter (HOSPITAL_BASED_OUTPATIENT_CLINIC_OR_DEPARTMENT_OTHER): Payer: Self-pay | Admitting: Family

## 2023-04-09 ENCOUNTER — Ambulatory Visit (HOSPITAL_BASED_OUTPATIENT_CLINIC_OR_DEPARTMENT_OTHER): Payer: Medicare PPO | Admitting: Family

## 2023-04-09 VITALS — BP 119/74 | HR 76 | Ht 69.0 in | Wt 285.3 lb

## 2023-04-09 DIAGNOSIS — R0609 Other forms of dyspnea: Secondary | ICD-10-CM

## 2023-04-09 DIAGNOSIS — I440 Atrioventricular block, first degree: Secondary | ICD-10-CM

## 2023-04-09 DIAGNOSIS — I77810 Thoracic aortic ectasia: Secondary | ICD-10-CM | POA: Diagnosis not present

## 2023-04-09 DIAGNOSIS — I25118 Atherosclerotic heart disease of native coronary artery with other forms of angina pectoris: Secondary | ICD-10-CM | POA: Diagnosis not present

## 2023-04-09 DIAGNOSIS — I10 Essential (primary) hypertension: Secondary | ICD-10-CM

## 2023-04-09 DIAGNOSIS — E785 Hyperlipidemia, unspecified: Secondary | ICD-10-CM | POA: Diagnosis not present

## 2023-04-09 DIAGNOSIS — G4733 Obstructive sleep apnea (adult) (pediatric): Secondary | ICD-10-CM | POA: Diagnosis not present

## 2023-04-09 MED ORDER — ISOSORBIDE MONONITRATE ER 30 MG PO TB24: 30.0000 mg | ORAL_TABLET | Freq: Every day | ORAL | 3 refills | Status: DC

## 2023-04-09 MED ORDER — TICAGRELOR 60 MG PO TABS: 60.0000 mg | ORAL_TABLET | Freq: Two times a day (BID) | ORAL | 1 refills | Status: DC

## 2023-04-09 MED ORDER — SPIRONOLACTONE 25 MG PO TABS: 25.0000 mg | ORAL_TABLET | Freq: Every day | ORAL | 3 refills | Status: DC

## 2023-04-09 MED ORDER — ROSUVASTATIN CALCIUM 10 MG PO TABS: 10.0000 mg | ORAL_TABLET | Freq: Every day | ORAL | 3 refills | Status: DC

## 2023-04-09 NOTE — Patient Instructions (Addendum)
Medication Instructions:  Your physician has recommended you make the following change in your medication:   Decrease: Valsartan 1/2 tablet   Start: Imdur 30 daily   We have sent refills rosuvastatin, brilinita, and spironolactone   Please avoid antibiotics like fluroquinolones *If you need a refill on your cardiac medications before your next appointment, please call your pharmacy*   Lab Work: Your physician recommends that you return for lab work today- BMP   If you have labs (blood work) drawn today and your tests are completely normal, you will receive your results only by: MyChart Message (if you have MyChart) OR A paper copy in the mail If you have any lab test that is abnormal or we need to change your treatment, we will call you to review the results.  Follow-Up: At [facility], you and your health needs are our priority.  As part of our continuing mission to provide you with exceptional heart care, we have created designated Provider Care Teams.  These Care Teams include your primary Cardiologist (physician) and Advanced Practice Providers (APPs -  Physician Assistants and Nurse Practitioners) who all work together to provide you with the care you need, when you need it.  We recommend signing up for the patient portal called "MyChart".  Sign up information is provided on this After Visit Summary.  MyChart is used to connect with patients for Virtual Visits (Telemedicine).  Patients are able to view lab/test results, encounter notes, upcoming appointments, etc.  Non-urgent messages can be sent to your provider as well.   To learn more about what you can do with MyChart, go to [email or web address].    Your next appointment:   4-6 week(s)  Provider:   Chilton Si, MD or Gillian Shields, NP    Other Instructions We will call in a week to check on blood pressures!

## 2023-04-09 NOTE — Progress Notes (Signed)
Office Visit    Patient Name: Erik Hancock Date of Encounter: 04/09/2023  PCP:  Erik Brooks, MD   [facility]  Cardiologist:  Erik Si, MD  Advanced Practice Provider:  No care team member to display Electrophysiologist:  None      Chief Complaint    Erik Hancock is a 72 y.o. male presents today for follow-up after cardiac testing  Past Medical History    Past Medical History:  Diagnosis Date   Arthritis    "all over" (07/29/2018)   CAD S/P percutaneous coronary angioplasty    a. stent to mLAD 11/2001. b. DES to PDA/mLCx 2007. c. DES to ostial LAD 07/2018.   Chronic lower back pain    CKD (chronic kidney disease), stage III (HCC)    CKD (chronic kidney disease), stage III (HCC)    Diabetic retinopathy (HCC)    Diabetic retinopathy (HCC)    GERD (gastroesophageal reflux disease)    High cholesterol    History of kidney stones    Hypertension    Mobitz type 2 second degree atrioventricular block    a. noted during 07/2018 adm, metoprolol discontinued.   Morbid obesity (HCC) 07/28/2018   Prostatitis    Pulmonary nodule 2013   CT   Sleep apnea    stopbang=5   Suspected sleep apnea    Type II diabetes mellitus (HCC)    Past Surgical History:  Procedure Laterality Date   CARDIAC CATHETERIZATION  JUNE 2003   PATENT LAD STENT/ BODERLINE OBSTRUCTIVE DISEASE POSTERIOR DESCENDING ARTERY/ NORMAL LVF   CATARACT EXTRACTION W/ INTRAOCULAR LENS  IMPLANT, BILATERAL Bilateral 2017   CERVICAL SCOVILLE FORAMINOTOMY W/ EXCISION OF HERNIATED NUCLEC PULPOSUS  2009   C6 - 7   CORONARY ANGIOPLASTY WITH STENT PLACEMENT  03/30/2006   DR Hancock - 90% LCx@OM2  TAXUS  DES 3.0 X 20 -> 3.25 mm,   95% RPDA -- TAXUS DES 3.0 X 16 --> 3.5 mm   CORONARY ANGIOPLASTY WITH STENT PLACEMENT  11/2001   (Dr. Ty Hancock for Dr. Mayford Hancock) - mLAD@D2  - TAXUS EXPRESS DES 3.5 x 15    CORONARY ANGIOPLASTY WITH STENT PLACEMENT  07/29/2018   CORONARY STENT INTERVENTION N/A  07/29/2018   Procedure: CORONARY STENT INTERVENTION;  Surgeon: Erik Lex, MD;  Location: [facility];  Service: Cardiovascular;  Laterality: N/A;   CYSTOSCOPY W/ RETROGRADES  05/04/2012   Procedure: CYSTOSCOPY WITH RETROGRADE PYELOGRAM;  Surgeon: Erik Cage, MD;  Location: [facility];  Service: Urology;  Laterality: Left;   CYSTOSCOPY W/ URETERAL STENT PLACEMENT  05/04/2012   Procedure: CYSTOSCOPY WITH STENT REPLACEMENT;  Surgeon: Erik Cage, MD;  Location: [facility];  Service: Urology;  Laterality: Left;   CYSTOSCOPY WITH STENT PLACEMENT Left 04/04/2012   CYSTOSCOPY WITH URETEROSCOPY  05/04/2012   Procedure: CYSTOSCOPY WITH URETEROSCOPY;  Surgeon: Erik Cage, MD;  Location: [facility];  Service: Urology;  Laterality: Left;       I & D EXTREMITY Right 02/11/2022   Procedure: IRRIGATION AND DEBRIDEMENT  OF HAND AND FOREARM;  Surgeon: Erik Bienenstock, MD;  Location: [facility] OR;  Service: Orthopedics;  Laterality: Right;   LEFT HEART CATH AND CORONARY ANGIOGRAPHY N/A 07/29/2018   Procedure: LEFT HEART CATH AND CORONARY ANGIOGRAPHY;  Surgeon: Erik Lex, MD;  Location: [facility] INVASIVE CV LAB;  Service: Cardiovascular;  Laterality: N/A;   LEFT URETEROSCOPIC STONE EXTRACTION  03-14-2003   X2   PARTIAL KNEE ARTHROPLASTY Left 06/02/2022  Procedure: UNICOMPARTMENTAL KNEE;  Surgeon: Erik Apley, MD;  Location: [facility];  Service: Orthopedics;  Laterality: Left;    Allergies  Allergies  Allergen Reactions   Beta Adrenergic Blockers     Metoprolol stopped 07/2018 due to 2nd degree type 2 AV block.    History of Present Illness    Erik Hancock is a 72 y.o. male with a hx of CAD s/p PCI, mild ascending aortic aneurysm, OSA, DVTs, hypertension, hyperlipidemia last seen 03/05/2023 by Dr. Duke Hancock.  2012 he had angina and underwent PCI of the LAD.  Echo 01/2014 LVEF 60 to 65%, moderately dilated RV.    Established with Dr. Duke Hancock October 2019 due to exertional dyspnea.   It was similar to when he previously required coronary stenting. Underwent LHC at that time revealing three-vessel CAD with stents in the LAD, left circumflex, PDA.  He was noted 95% ostial LAD lesion that was successfully treated with scoring balloon angioplasty and DES.  He had a sleep study that was positive and felt he would benefit from BiPAP but he declined.  02/2020 Brilinta was reduced to 60 mg twice daily.  October 2022 reported atypical chest pain with nuclear stress test 09/2021 with low risk EF 56%.  Echo 09/2021 grade 1 diastolic dysfunction and ascending aorta 3.8 cm.  He had home sleep study and CPAP was recommended.  Later started on valsartan due to poorly controlled blood pressure.  He had ABIs 01/2022 and arteries were noncompressible.  Echo at that time LVEF 60 to 65%, moderate LVH, ascending aorta 3.8 cm, RA pressure 8 mmHg.  Underwent left knee replacement 05/2022.  Has previously been able to tolerate AV nodal agents due to bradycardia or thiazide diuretics due to hypokalemia.  Given ankle edema amlodipine previously deferred.  Last seen 03/05/2023 at which time he noted intermittent exertional dyspnea for 1 year which was overall stable.  Also noted weight gain since having knee replacement.  He had 1 episode of chest pain the month prior characterized by aching pain not associate with shortness of breath.  He also noted lower extremity edema for which furosemide was started.  Echo 04/06/2023 normal LVEF, impaired diastolic function, ascending aorta 41 mm recommended for monitoring in 1 year.  Myoview 5/50/24 with intermediate risk with EF 48% (likely falsely low) and mild inferior wall defect from the base to the mid wall at rest which worsens with consistent with inferior infarct with peri-infarct ischemia.  He presents today for follow-up with his wife. Notes no chest pain since last office visit. Will get exertional dysponea when working on his property carrying buckets of animal feed or  walking to the animals.  His mom has 40 acres that he helps maintain and he has 6 acres. Not as bad around the house. With activities such as feeding animals and carrying feed buckets. Stops and rests for 2-3 minutes and recovers. Trouble sleeping which he attributes to his left knee pain. Reports no orthopnea, PND.  Lower extremity edema improved since the addition of Lasix.  EKGs/Labs/Other Studies Reviewed:   The following studies were reviewed today: Cardiac Studies & Procedures   CARDIAC CATHETERIZATION  CARDIAC CATHETERIZATION 07/29/2018  Narrative Images from the original result were not included.   Ost LAD lesion is 85% stenosed -just prior to previous stent. Previously placed prox LAD DES stent is 5% stenosed.  Following scoring balloon angioplasty, A drug-eluting stent was successfully placed overlapping the previous stent proximally, using a STENT ORSIRO 3.5X13 --postdilated to  4.0 mm  Post intervention, there is a 0% residual stenosis.  Previously placed Prox Cx to Dist Cx stent (DES) is widely patent. Ost 2nd Mrg lesion is 40% stenosed.  Ost Cx to Prox Cx lesion is 30% stenosed.  Previously placed RPDA-1 stent (DES) is widely patent. RPDA-2 lesion is 40% stenosed just beyond stent.  The left ventricular systolic function is normal. The left ventricular ejection fraction is 55-65% by visual estimate. LV end diastolic pressure is normal.  Post intervention, there is a 5% residual stenosis.  A drug-eluting stent was successfully placed using a STENT ORSIRO 3.5X13.  Three-vessel CAD with stents in the LAD, circumflex and PDA. Culprit lesion is severe 95% proximal edge lesion and ostial LAD -> successfully treated with scoring balloon angioplasty and DES stent overlapping the original stent (STENT ORSIRO 3.5X13 -postdilated to 4.0 mm) Patent stents in circumflex and RPDA Normal LV function with normal LVEDP.   As the patient has an ostial LAD stent, I felt it more prudent  to monitor the patient overnight as opposed to same-day discharge.  He will be transferred to postprocedure unit for ongoing care.  Continue home medications with exception of his diabetes medications (will cover with sliding scale insulin)  Recommend dual antiplatelet therapy with Aspirin 81mg  daily and Ticagrelor 90mg  twice daily long-term (beyond 12 months) because of Ostial LAD stent.  Would be okay to stop aspirin after 3 to 6 months, but would continue Brilinta at 90 mg for 1 year and then reduced to 60 mg/convert to Plavix 75 mg after 1 year.[name]   He will follow-up with Dr. Chilton Hancock.    Bryan Lemma, M.D., M.S. Interventional Cardiologist  Pager # [phone] Phone # [phone] [address] [place], [place] [address]  Findings Coronary Findings Diagnostic  Dominance: Right  Left Main Vessel is large.  Left Anterior Descending Ost LAD lesion is 85% stenosed. The lesion is located proximal to the major branch and focal. Just proximal to stent The lesion is mildly calcified. Prox LAD lesion is 5% stenosed. The lesion is located proximal to the major branch. The lesion was previously treated using a drug eluting stent over 2 years ago. Taxus Express 3.5 x 15 Previously placed stent displays restenosis.  First Diagonal Branch Vessel is small in size.  First Septal Branch Vessel is small in size.  Second Diagonal Branch Vessel is moderate in size.  Third Diagonal Branch Vessel is small in size.  Third Septal Branch Vessel is small in size.  Left Circumflex Vessel is large. Ost Cx to Prox Cx lesion is 30% stenosed. The lesion is segmental and irregular. Previously placed Prox Cx to Dist Cx stent (unknown type) is widely patent. Taxus DES 3.0 x 20 -&gt; 3.25 mm 2007  First Obtuse Marginal Branch Vessel is small in size.  Second Obtuse Marginal Branch Vessel is large in size. Ost 2nd Mrg lesion is 40% stenosed.  Third Obtuse Marginal  Branch Vessel is small in size.  Right Coronary Artery  Acute Marginal Branch Vessel is small in size.  Right Posterior Descending Artery Vessel is large in size. Previously placed RPDA-1 stent (unknown type) is widely patent. Taxus DES 3.0 x 16 - 3.5 mm, RPDA-2 lesion is 40% stenosed.  Inferior Septal Vessel is small in size.  Right Posterior Atrioventricular Artery Vessel is small in size.  First Right Posterolateral Branch Vessel is small in size.  Intervention  Ost LAD lesion Stent (Also treats lesions: Prox LAD) Lesion length:  6  mm. CATH VISTA GUIDE 6FR XBLAD3.5 guide catheter was inserted. Lesion crossed with guidewire using a WIRE ASAHI PROWATER 180CM. Pre-stent angioplasty was performed using a BALLOON [place] 3.00X10. Maximum pressure:  14 atm. Inflation time:  20 sec. 1: BALLOON EMERGE MR 2.5X12 (29528) -10 atm, would not expand focal lesion A drug-eluting stent was successfully placed using a STENT ORSIRO 3.5X13. Maximum pressure: 16 atm. Inflation time: 20 sec. Minimum lumen area:  4.1 mm. Stent strut is well apposed. Stent overlaps previously placed stent. Post-stent angioplasty was performed using a BALLOON [place] EMERGE MR 4.0X8. Maximum pressure:  16 atm. Inflation time:  20 sec. Initially used 3.75 mm  X 8 balloon 18 Atm x 20 sec Post-Intervention Lesion Assessment The intervention was successful. Pre-interventional TIMI flow is 2. Post-intervention TIMI flow is 3. Treated lesion length:  10 mm. No complications occurred at this lesion. There is a 0% residual stenosis post intervention.  Prox LAD lesion Stent (Also treats lesions: Ost LAD) See details in Ost LAD lesion. Post-Intervention Lesion Assessment The intervention was successful. There is a 5% residual stenosis post intervention.   STRESS TESTS  MYOCARDIAL PERFUSION IMAGING 04/07/2023  Narrative   Findings are consistent with infarction with peri-infarct ischemia. The study is intermediate risk.    No ST deviation was noted.   Left ventricular function is normal. Nuclear stress EF: 48 %. The left ventricular ejection fraction is mildly decreased (45-54%). End diastolic cavity size is normal.   Prior study available for comparison from 09/24/2021.  Mild inferior wall defect from the base to the mid wall at rest that worsens with stress consistent with inferior infarct with peri-infarct ischemia.   ECHOCARDIOGRAM  ECHOCARDIOGRAM COMPLETE 04/06/2023  Narrative ECHOCARDIOGRAM REPORT    Patient Name:   JYE MOOS Date of Exam: 04/06/2023 Medical Rec #:  [id number]      Height:       69.0 in Accession #:    [id number]     Weight:       281.6 lb Date of Birth:  1950/11/30      BSA:          2.389 m Patient Age:    72 years       BP:           128/68 mmHg Patient Gender: M              HR:           62 bpm. Exam Location:  [address]  Procedure: 2D Echo, 3D Echo, Cardiac Doppler, Color Doppler and Strain Analysis  Indications:    R07.9 Chest Pain  History:        Patient has prior history of Echocardiogram examinations, most recent 02/12/2022. CAD, Signs/Symptoms:Chest Pain and Shortness of Breath; Risk Factors:Hypertension, Diabetes, Dyslipidemia, Sleep Apnea and Family History of Coronary Artery Disease. Chronic Kidney Disease, Obesity.  Sonographer:    Farrel Conners RDCS Referring Phys: TIFFANY Bentley  IMPRESSIONS   1. Left ventricular ejection fraction, by estimation, is 60 to 65%. The left ventricle has normal function. The left ventricle has no regional wall motion abnormalities. There is mild left ventricular hypertrophy. Left ventricular diastolic parameters are consistent with Grade I diastolic dysfunction (impaired relaxation). The average left ventricular global longitudinal strain is -20.6 %. The global longitudinal strain is normal. 2. Right ventricular systolic function is normal. The right ventricular size is normal. 3. Left atrial size was moderately  dilated. 4. Cannot r/o small PFO on limited views provided.  5. Right atrial size was mildly dilated. 6. The mitral valve is abnormal. Trivial mitral valve regurgitation. No evidence of mitral stenosis. 7. The aortic valve is tricuspid. There is mild calcification of the aortic valve. There is mild thickening of the aortic valve. Aortic valve regurgitation is trivial. Aortic valve sclerosis is present, with no evidence of aortic valve stenosis. 8. Aortic dilatation noted. There is mild dilatation of the ascending aorta, measuring 41 mm. 9. The inferior vena cava is normal in size with greater than 50% respiratory variability, suggesting right atrial pressure of 3 mmHg.  FINDINGS Left Ventricle: Left ventricular ejection fraction, by estimation, is 60 to 65%. The left ventricle has normal function. The left ventricle has no regional wall motion abnormalities. The average left ventricular global longitudinal strain is -20.6 %. The global longitudinal strain is normal. The left ventricular internal cavity size was normal in size. There is mild left ventricular hypertrophy. Left ventricular diastolic parameters are consistent with Grade I diastolic dysfunction (impaired relaxation).  Right Ventricle: The right ventricular size is normal. No increase in right ventricular wall thickness. Right ventricular systolic function is normal.  Left Atrium: Left atrial size was moderately dilated.  Right Atrium: Right atrial size was mildly dilated.  Pericardium: There is no evidence of pericardial effusion.  Mitral Valve: The mitral valve is abnormal. There is mild thickening of the mitral valve leaflet(s). There is mild calcification of the mitral valve leaflet(s). Mild mitral annular calcification. Trivial mitral valve regurgitation. No evidence of mitral valve stenosis.  Tricuspid Valve: The tricuspid valve is normal in structure. Tricuspid valve regurgitation is mild . No evidence of tricuspid  stenosis.  Aortic Valve: The aortic valve is tricuspid. There is mild calcification of the aortic valve. There is mild thickening of the aortic valve. Aortic valve regurgitation is trivial. Aortic valve sclerosis is present, with no evidence of aortic valve stenosis.  Pulmonic Valve: The pulmonic valve was normal in structure. Pulmonic valve regurgitation is not visualized. No evidence of pulmonic stenosis.  Aorta: Aortic dilatation noted. There is mild dilatation of the ascending aorta, measuring 41 mm.  Venous: The inferior vena cava is normal in size with greater than 50% respiratory variability, suggesting right atrial pressure of 3 mmHg.  IAS/Shunts: No atrial level shunt detected by color flow Doppler.   LEFT VENTRICLE PLAX 2D LVIDd:         5.40 cm   Diastology LVIDs:         3.20 cm   LV e' medial:    8.38 cm/s LV PW:         1.20 cm   LV E/e' medial:  9.4 LV IVS:        1.20 cm   LV e' lateral:   12.30 cm/s LVOT diam:     2.40 cm   LV E/e' lateral: 6.4 LV SV:         107 LV SV Index:   45        2D Longitudinal Strain LVOT Area:     4.52 cm  2D Strain GLS (A2C):   -19.0 % 2D Strain GLS (A3C):   -21.9 % 2D Strain GLS (A4C):   -20.9 % 2D Strain GLS Avg:     -20.6 %  3D Volume EF: 3D EF:        57 % LV EDV:       115 ml LV ESV:       49 ml LV SV:  66 ml  RIGHT VENTRICLE RV Basal diam:  4.90 cm RV Mid diam:    4.10 cm RV S prime:     17.80 cm/s TAPSE (M-mode): 2.8 cm  LEFT ATRIUM           Index        RIGHT ATRIUM           Index LA diam:      4.80 cm 2.01 cm/m   RA Pressure: 3.00 mmHg LA Vol (A2C): 60.4 ml 25.28 ml/m  RA Area:     22.80 cm RA Volume:   79.50 ml  33.27 ml/m AORTIC VALVE LVOT Vmax:   115.00 cm/s LVOT Vmean:  77.600 cm/s LVOT VTI:    0.237 m  AORTA Ao Root diam: 3.60 cm Ao Asc diam:  4.10 cm  MITRAL VALVE                TRICUSPID VALVE MV Area (PHT): cm          Estimated RAP:  3.00 mmHg MV Decel Time: 239 msec MV E velocity:  79.05 cm/s   SHUNTS MV A velocity: 114.50 cm/s  Systemic VTI:  0.24 m MV E/A ratio:  0.69         Systemic Diam: 2.40 cm  Charlton Haws MD Electronically signed by Charlton Haws MD Signature Date/Time: 04/06/2023/10:55:41 AM    Final              EKG:  EKG is not ordered today.    Recent Labs: 12/22/2022: ALT 27; BUN 15; Creat 1.07; Hemoglobin 13.5; Platelets 250; Potassium 4.1; Sodium 141 03/05/2023: BNP 56.3  Recent Lipid Panel    Component Value Date/Time   CHOL 115 07/15/2022 0759   TRIG 85 07/15/2022 0759   HDL 55 07/15/2022 0759   CHOLHDL 2.1 07/15/2022 0759   CHOLHDL 2.9 06/26/2021 0921   VLDL 29 05/10/2017 0832   LDLCALC 43 07/15/2022 0759   LDLCALC 71 06/26/2021 0921    Home Medications   Current Meds  Medication Sig   acetaminophen (TYLENOL) 500 MG tablet Take 2 tablets (1,000 mg total) by mouth every 6 (six) hours as needed for mild pain or moderate pain.   aspirin 81 MG tablet Take 81 mg by mouth daily.   furosemide (LASIX) 20 MG tablet TAKE 2 TABLETS DAILY FOR 3 DAYS AND THEN DECREASE TO 1 TABLET DAILY   Insulin Aspart FlexPen (NOVOLOG) 100 UNIT/ML SMARTSIG:30 Unit(s) SUB-Q Twice Daily   Insulin Pen Needle 32G X 4 MM MISC Use 4x a day   nitroGLYCERIN (NITROLINGUAL) 0.4 MG/SPRAY spray Place 1 spray under the tongue every 5 (five) minutes x 3 doses as needed for chest pain.   pantoprazole (PROTONIX) 40 MG tablet Take 1 tablet by mouth once daily   rosuvastatin (CRESTOR) 10 MG tablet Take 1 tablet by mouth once daily   Semaglutide, 1 MG/DOSE, (OZEMPIC, 1 MG/DOSE,) 4 MG/3ML SOPN Inject 1 mg into the skin once a week.   spironolactone (ALDACTONE) 25 MG tablet Take 1 tablet (25 mg total) by mouth daily.   ticagrelor (BRILINTA) 60 MG TABS tablet Take 1 tablet by mouth twice daily   TRESIBA FLEXTOUCH 200 UNIT/ML FlexTouch Pen Inject 24 Units into the skin daily.   valsartan (DIOVAN) 320 MG tablet Take 1 tablet (320 mg total) by mouth daily.     Review of Systems       All other systems reviewed and are otherwise negative except as noted above.  Physical Exam    VS:  BP 119/74 (BP Location: Left Arm, Patient Position: Sitting, Cuff Size: Large)   Pulse 76   Ht 5\' 9"  (1.753 m)   Wt 285 lb 4.8 oz (129.4 kg)   SpO2 95%   BMI 42.13 kg/m  , BMI Body mass index is 42.13 kg/m.  Wt Readings from Last 3 Encounters:  04/09/23 285 lb 4.8 oz (129.4 kg)  04/06/23 281 lb (127.5 kg)  03/18/23 281 lb 9.6 oz (127.7 kg)     GEN: Well nourished, overweight, well developed, in no acute distress. HEENT: normal. Neck: Supple, no JVD, carotid bruits, or masses. Cardiac: RRR, no murmurs, rubs, or gallops. No clubbing, cyanosis, edema.  Radials/PT 2+ and equal bilaterally.  Respiratory:  Respirations regular and unlabored, clear to auscultation bilaterally. GI: Soft, nontender, nondistended. MS: No deformity or atrophy. Skin: Warm and dry, no rash. Neuro:  Strength and sensation are intact. Psych: Normal affect.  Assessment & Plan    Exertional dyspnea / CAD s/p PCI / HLD -LHC 07/2018 previously placed DES-Cx patent, previously placed RPDA DES patent, RPDA lesion 40% stenosed just beyond stent, there was 85% ost LAD stenosis just prior to previously placed stent treated with scoring balloon angioplasty and DES overlapping previously placed stent with 0%  residual stenosis. Recommended for indefinite DAPT.  1 year history of exertional dyspnea with more than usual activities such as walking with feed buckets to feed animals on his mom's 40 acre property.  This is unchanged. Has had no recurrent chest pain since last office visit.  Myoview 04/07/23 intermediate risk study with inferior infarct with peri infarct ischemia. Prior myoview 09/2021 low risk study. GDMT Aspirin, Brilinta, Rosuvastatin. Refills provided. No BB due to previous bradycardia, 1st degree AV block. Will trial one month of increasing antianginal therapy with the addition of Imdur 30 mg daily.  If dyspnea  not improved at clinic visit in 1 month consider LHC for further evaluation.  LE edema - Echo 03/2023 normal LVEF 60 to 65% with gr1DD.  Anticipate venous insufficiency, at working on his feet in the heat on his property contributory.  Continue present dose Lasix 3 mg daily, spironolactone 25 mg daily.  Update BMP for monitoring.  Recommend leg elevation compression socks.  OSA-presently untreated.  Encouraged to consider CPAP.  First-degree AV block-continue to monitor with periodic EKG.  Beta-blockers avoided due to prior bradycardia.  Morbid obesity/diabetes-follows with Dr. Elvera Lennox of endocrinology.  Ascending aorta dilation -41 mm by echo 03/2023.  Repeat echo in 1 year already ordered.  Continue optimal BP control.  Recommend avoidance of fluoroquinolones.  Hypertension-relatively hypotensive today but asymptomatic with no lightheadedness, dizziness.  Likely related to the addition of Lasix.  As we are adding Imdur, as above plan to reduce valsartan to half tablet (160 mg) daily, continue spironolactone 25 mg daily, furosemide 20 mg daily. Discussed to monitor BP at home at least 2 hours after medications and sitting for 5-10 minutes.  Check in via phone call in 1 week.       Disposition: Follow up in 1 year(s) with Erik Si, MD or APP.  Signed, Alver Sorrow, NP 04/09/2023, 8:37 AM [facility]

## 2023-04-10 LAB — BASIC METABOLIC PANEL
BUN/Creatinine Ratio: 11 (ref 10–24)
BUN: 13 mg/dL (ref 8–27)
CO2: 22 mmol/L (ref 20–29)
Calcium: 9.9 mg/dL (ref 8.6–10.2)
Chloride: 106 mmol/L (ref 96–106)
Creatinine, Ser: 1.16 mg/dL (ref 0.76–1.27)
Glucose: 76 mg/dL (ref 70–99)
Potassium: 4.1 mmol/L (ref 3.5–5.2)
Sodium: 143 mmol/L (ref 134–144)
eGFR: 67 mL/min/{1.73_m2} (ref >59)

## 2023-04-12 ENCOUNTER — Telehealth (HOSPITAL_BASED_OUTPATIENT_CLINIC_OR_DEPARTMENT_OTHER): Payer: Self-pay

## 2023-04-12 NOTE — Telephone Encounter (Addendum)
Called results to patient and left results on VM (ok per DPR), instructions left to call office back if patient has any questions!    ----- Message from Alver Sorrow, NP sent at 04/12/2023  7:56 AM EDT ----- Stable kidney function. Normal electrolytes. Good result!

## 2023-04-13 ENCOUNTER — Telehealth (HOSPITAL_BASED_OUTPATIENT_CLINIC_OR_DEPARTMENT_OTHER): Payer: Self-pay

## 2023-04-13 NOTE — Telephone Encounter (Addendum)
Left message for patient to call back     ----- Message from Marlene Lard, RN sent at 04/09/2023  9:06 AM EDT ----- Call to check on BP

## 2023-04-15 ENCOUNTER — Ambulatory Visit: Payer: Medicare PPO | Admitting: Family Medicine

## 2023-04-15 NOTE — Telephone Encounter (Signed)
Called patient, wife answered the phone (ok per DPR). She states He started medication Monday 100/55 110/59,   Informed her I would call him in a week to check back in on pressures. NP notified

## 2023-04-22 ENCOUNTER — Telehealth (HOSPITAL_BASED_OUTPATIENT_CLINIC_OR_DEPARTMENT_OTHER): Payer: Self-pay

## 2023-04-22 NOTE — Telephone Encounter (Addendum)
Called patient to check on blood pressures since last phone check in on 5/21. No answer, left message for patient to call back.    ----- Message from Marlene Lard, RN sent at 04/15/2023  8:38 AM EDT ----- BP check in

## 2023-04-23 MED ORDER — VALSARTAN 160 MG PO TABS: 160.0000 mg | ORAL_TABLET | Freq: Every day | ORAL | 3 refills | Status: DC

## 2023-04-23 NOTE — Telephone Encounter (Signed)
Blood pressure overall well-controlled.  He is presently taking 1/2 tablet of valsartan-if easier we can prescribe the lower strength tablet.  Alver Sorrow, NP

## 2023-04-23 NOTE — Telephone Encounter (Signed)
2nd call attempt to patient, no answer, left message to call back.  

## 2023-04-23 NOTE — Telephone Encounter (Signed)
Per Gillian Shields, NP   "Blood pressure overall well-controlled.  He is presently taking 1/2 tablet of valsartan-if easier we can prescribe the lower strength tablet.   Alver Sorrow, NP"   Returned call to patient and spoke with wife (ok per DPR), reviewed the above recommendations. New Rx for lower dose sent to pharmacy.

## 2023-04-23 NOTE — Telephone Encounter (Signed)
Patients wife returned call, transferred from call center,   100/55 110/59 138/76 104/57 152/81 111/57 119/60 131/70  She states these are about an hour after his medication.

## 2023-04-23 NOTE — Addendum Note (Signed)
Addended by: Marlene Lard on: 04/23/2023 10:06 AM   Modules accepted: Orders

## 2023-05-18 ENCOUNTER — Other Ambulatory Visit: Payer: Self-pay | Admitting: Family Medicine

## 2023-05-21 ENCOUNTER — Telehealth (HOSPITAL_BASED_OUTPATIENT_CLINIC_OR_DEPARTMENT_OTHER): Payer: Medicare PPO | Admitting: Family

## 2023-06-04 ENCOUNTER — Encounter: Payer: Medicare PPO | Admitting: Family Medicine

## 2023-06-24 DIAGNOSIS — E1159 Type 2 diabetes mellitus with other circulatory complications: Secondary | ICD-10-CM | POA: Diagnosis not present

## 2023-07-08 ENCOUNTER — Telehealth (HOSPITAL_BASED_OUTPATIENT_CLINIC_OR_DEPARTMENT_OTHER): Payer: Self-pay | Admitting: Cardiovascular Disease

## 2023-07-08 MED ORDER — FUROSEMIDE 20 MG PO TABS: 20.0000 mg | ORAL_TABLET | Freq: Every day | ORAL | 2 refills | Status: DC

## 2023-07-08 MED ORDER — SPIRONOLACTONE 25 MG PO TABS: 25.0000 mg | ORAL_TABLET | Freq: Every day | ORAL | 2 refills | Status: DC

## 2023-07-08 NOTE — Telephone Encounter (Signed)
Rx(s) sent to pharmacy electronically.  

## 2023-07-08 NOTE — Telephone Encounter (Signed)
*  STAT* If patient is at the pharmacy, call can be transferred to refill team.   1. Which medications need to be refilled? (please list name of each medication and dose if known)   spironolactone (ALDACTONE) 25 MG tablet  furosemide (LASIX) 20 MG tablet   2. Would you like to learn more about the convenience, safety, & potential cost savings by using the [facility]?   3. Are you open to using the [facility] (Type [facility]. ).  4. Which pharmacy/location (including street and city if local pharmacy) is medication to be sent to?    [facility] - [place], [place] - [address]   5. Do they need a 30 day or 90 day supply?   90 day  Wife stated patient still has some medication left.

## 2023-07-09 ENCOUNTER — Other Ambulatory Visit: Payer: Medicare PPO

## 2023-07-09 DIAGNOSIS — E1159 Type 2 diabetes mellitus with other circulatory complications: Secondary | ICD-10-CM | POA: Diagnosis not present

## 2023-07-09 DIAGNOSIS — I10 Essential (primary) hypertension: Secondary | ICD-10-CM

## 2023-07-09 DIAGNOSIS — N1831 Chronic kidney disease, stage 3a: Secondary | ICD-10-CM | POA: Diagnosis not present

## 2023-07-09 DIAGNOSIS — E7800 Pure hypercholesterolemia, unspecified: Secondary | ICD-10-CM | POA: Diagnosis not present

## 2023-07-09 DIAGNOSIS — E1165 Type 2 diabetes mellitus with hyperglycemia: Secondary | ICD-10-CM | POA: Diagnosis not present

## 2023-07-10 LAB — COMPLETE METABOLIC PANEL WITH GFR
AG Ratio: 1.6 (calc) (ref 1.0–2.5)
ALT: 27 U/L (ref 9–46)
AST: 20 U/L (ref 10–35)
Albumin: 4.2 g/dL (ref 3.6–5.1)
Alkaline phosphatase (APISO): 69 U/L (ref 35–144)
BUN/Creatinine Ratio: 16 (calc) (ref 6–22)
BUN: 21 mg/dL (ref 7–25)
CO2: 23 mmol/L (ref 20–32)
Calcium: 9.9 mg/dL (ref 8.6–10.3)
Chloride: 107 mmol/L (ref 98–110)
Creat: 1.32 mg/dL — ABNORMAL HIGH (ref 0.70–1.28)
Globulin: 2.6 g/dL (calc) (ref 1.9–3.7)
Glucose, Bld: 164 mg/dL — ABNORMAL HIGH (ref 65–99)
Potassium: 5.4 mmol/L — ABNORMAL HIGH (ref 3.5–5.3)
Sodium: 139 mmol/L (ref 135–146)
Total Bilirubin: 0.8 mg/dL (ref 0.2–1.2)
Total Protein: 6.8 g/dL (ref 6.1–8.1)
eGFR: 58 mL/min/{1.73_m2} — ABNORMAL LOW (ref >=60)

## 2023-07-10 LAB — MICROALBUMIN / CREATININE URINE RATIO
Creatinine, Urine: 140 mg/dL (ref 20–320)
Microalb Creat Ratio: 4 mg/g{creat} (ref <30)
Microalb, Ur: 0.6 mg/dL

## 2023-07-10 LAB — HEMOGLOBIN A1C
Hgb A1c MFr Bld: 7.3 %{Hb} — ABNORMAL HIGH (ref <5.7)
Mean Plasma Glucose: 163 mg/dL
eAG (mmol/L): 9 mmol/L

## 2023-07-10 LAB — CBC WITH DIFFERENTIAL/PLATELET
Absolute Monocytes: 576 {cells}/uL (ref 200–950)
Basophils Absolute: 43 {cells}/uL (ref 0–200)
Basophils Relative: 0.6 %
Eosinophils Absolute: 410 {cells}/uL (ref 15–500)
Eosinophils Relative: 5.7 %
HCT: 39.6 % (ref 38.5–50.0)
Hemoglobin: 13.1 g/dL — ABNORMAL LOW (ref 13.2–17.1)
Lymphs Abs: 2066 {cells}/uL (ref 850–3900)
MCH: 30.6 pg (ref 27.0–33.0)
MCHC: 33.1 g/dL (ref 32.0–36.0)
MCV: 92.5 fL (ref 80.0–100.0)
MPV: 9 fL (ref 7.5–12.5)
Monocytes Relative: 8 %
Neutro Abs: 4104 {cells}/uL (ref 1500–7800)
Neutrophils Relative %: 57 %
Platelets: 257 10*3/uL (ref 140–400)
RBC: 4.28 10*6/uL (ref 4.20–5.80)
RDW: 12.8 % (ref 11.0–15.0)
Total Lymphocyte: 28.7 %
WBC: 7.2 10*3/uL (ref 3.8–10.8)

## 2023-07-10 LAB — LIPID PANEL
Cholesterol: 113 mg/dL (ref <200)
HDL: 56 mg/dL (ref >=40)
LDL Cholesterol (Calc): 37 mg/dL
Non-HDL Cholesterol (Calc): 57 mg/dL (calc) (ref <130)
Total CHOL/HDL Ratio: 2 (calc) (ref <5.0)
Triglycerides: 110 mg/dL (ref <150)

## 2023-07-10 LAB — VITAMIN B12: Vitamin B-12: 196 pg/mL — ABNORMAL LOW (ref 200–1100)

## 2023-07-13 ENCOUNTER — Ambulatory Visit (INDEPENDENT_AMBULATORY_CARE_PROVIDER_SITE_OTHER): Payer: Medicare PPO | Admitting: Family Medicine

## 2023-07-13 VITALS — BP 132/86 | HR 92 | Temp 97.9°F | Ht 69.0 in | Wt 286.3 lb

## 2023-07-13 DIAGNOSIS — E1122 Type 2 diabetes mellitus with diabetic chronic kidney disease: Secondary | ICD-10-CM | POA: Diagnosis not present

## 2023-07-13 DIAGNOSIS — W57XXXA Bitten or stung by nonvenomous insect and other nonvenomous arthropods, initial encounter: Secondary | ICD-10-CM

## 2023-07-13 DIAGNOSIS — G5603 Carpal tunnel syndrome, bilateral upper limbs: Secondary | ICD-10-CM | POA: Diagnosis not present

## 2023-07-13 DIAGNOSIS — I10 Essential (primary) hypertension: Secondary | ICD-10-CM

## 2023-07-13 DIAGNOSIS — Z125 Encounter for screening for malignant neoplasm of prostate: Secondary | ICD-10-CM

## 2023-07-13 DIAGNOSIS — N1831 Chronic kidney disease, stage 3a: Secondary | ICD-10-CM

## 2023-07-13 DIAGNOSIS — E7800 Pure hypercholesterolemia, unspecified: Secondary | ICD-10-CM

## 2023-07-13 DIAGNOSIS — Z0001 Encounter for general adult medical examination with abnormal findings: Secondary | ICD-10-CM | POA: Diagnosis not present

## 2023-07-13 DIAGNOSIS — Z794 Long term (current) use of insulin: Secondary | ICD-10-CM

## 2023-07-13 DIAGNOSIS — Z0000 Encounter for general adult medical examination without abnormal findings: Secondary | ICD-10-CM

## 2023-07-13 MED ORDER — SEMAGLUTIDE (2 MG/DOSE) 8 MG/3ML ~~LOC~~ SOPN: 2.0000 mg | PEN_INJECTOR | SUBCUTANEOUS | 3 refills | Status: DC

## 2023-07-13 NOTE — Progress Notes (Signed)
Subjective:    Patient ID: Erik Hancock, male    DOB: [birth year], 72 y.o.   MRN: [id number] Immunization History  Administered Date(s) Administered   COVID-19, mRNA, vaccine(Comirnaty)12 years and older 12/21/2022   Fluad Quad(high Dose 65+) 08/08/2019, 09/24/2021   Hep A / Hep B 01/05/2019, 04/05/2019, 09/11/2019   Influenza,inj,Quad PF,6+ Mos 09/07/2013, 08/03/2014, 10/22/2015   Influenza,inj,Quad PF,6-35 Mos 07/06/2018   Influenza-Unspecified 08/08/2019, 12/21/2022   PFIZER(Purple Top)SARS-COV-2 Vaccination 01/15/2020, 02/05/2020, 11/14/2020, 04/25/2021   PNEUMOCOCCAL CONJUGATE-20 04/25/2021   Pneumococcal Conjugate-13 02/11/2017, 09/04/2017   Pneumococcal Polysaccharide-23 09/07/2013, 07/06/2018   Rsv, Bivalent, Protein Subunit Rsvpref,pf [name] 12/21/2022   Tdap 02/02/2014, 01/05/2019, 02/11/2022   Zoster Recombinant(Shingrix) 07/06/2018, 10/31/2018   Patient presents today for complete physical exam.  He has several issues.  First he reports aching throbbing pain in both hands distal to his wrist.  This gets worse whenever he is doing any pain such as working in his garden carrying buckets.  He states that his hands will ache and throb and even go numb.  If he raises his hands over his head and will get better.  If he stops the activity it will get better.  Symptoms sound like carpal tunnel syndrome.  He also endorses dyspnea on exertion that is getting worse.  He stress test in May that showed reversible peri-infarct ischemia.  Cardiology recommended increase goal-directed medical therapy and if symptoms persisted possibly pursuing a left heart cath.  The patient has not contacted them to let them know that the shortness of breath is getting worse.  I encouraged him to do so.  He has had several tick bites and he wants to be screened for Lyme disease.  He states that one of the tick bites created a red circle.  His immunizations are up-to-date except for the flu shot.  Cologuard was  performed earlier this year and was negative.  He is due for prostate cancer screening.  Most recent lab work is listed below and shows an A1c of 7.3.  He does have a mild elevation in his creatinine but I suspect this is likely due to the Lasix and spironolactone.  His cholesterol is outstanding Past Medical History:  Diagnosis Date   Arthritis    "all over" (07/29/2018)   CAD S/P percutaneous coronary angioplasty    a. stent to mLAD 11/2001. b. DES to PDA/mLCx 2007. c. DES to ostial LAD 07/2018.   Chronic lower back pain    CKD (chronic kidney disease), stage III (HCC)    CKD (chronic kidney disease), stage III (HCC)    Diabetic retinopathy (HCC)    Diabetic retinopathy (HCC)    GERD (gastroesophageal reflux disease)    High cholesterol    History of kidney stones    Hypertension    Mobitz type 2 second degree atrioventricular block    a. noted during 07/2018 adm, metoprolol discontinued.   Morbid obesity (HCC) 07/28/2018   Prostatitis    Pulmonary nodule 2013   CT   Sleep apnea    stopbang=5   Suspected sleep apnea    Type II diabetes mellitus (HCC)    Past Surgical History:  Procedure Laterality Date   CARDIAC CATHETERIZATION  JUNE 2003   PATENT LAD STENT/ BODERLINE OBSTRUCTIVE DISEASE POSTERIOR DESCENDING ARTERY/ NORMAL LVF   CATARACT EXTRACTION W/ INTRAOCULAR LENS  IMPLANT, BILATERAL Bilateral 2017   CERVICAL SCOVILLE FORAMINOTOMY W/ EXCISION OF HERNIATED NUCLEC PULPOSUS  2009   C6 - 7   CORONARY ANGIOPLASTY  WITH STENT PLACEMENT  03/30/2006   DR EDMUNDS - 90% LCx@OM2  TAXUS  DES 3.0 X 20 -> 3.25 mm,   95% RPDA -- TAXUS DES 3.0 X 16 --> 3.5 mm   CORONARY ANGIOPLASTY WITH STENT PLACEMENT  11/2001   (Dr. Ty Hilts for Dr. Mayford Knife) - mLAD@D2  - TAXUS EXPRESS DES 3.5 x 15    CORONARY ANGIOPLASTY WITH STENT PLACEMENT  07/29/2018   CORONARY STENT INTERVENTION N/A 07/29/2018   Procedure: CORONARY STENT INTERVENTION;  Surgeon: Marykay Lex, MD;  Location: [facility];  Service:  Cardiovascular;  Laterality: N/A;   CYSTOSCOPY W/ RETROGRADES  05/04/2012   Procedure: CYSTOSCOPY WITH RETROGRADE PYELOGRAM;  Surgeon: Milford Cage, MD;  Location: [facility];  Service: Urology;  Laterality: Left;   CYSTOSCOPY W/ URETERAL STENT PLACEMENT  05/04/2012   Procedure: CYSTOSCOPY WITH STENT REPLACEMENT;  Surgeon: Milford Cage, MD;  Location: [facility];  Service: Urology;  Laterality: Left;   CYSTOSCOPY WITH STENT PLACEMENT Left 04/04/2012   CYSTOSCOPY WITH URETEROSCOPY  05/04/2012   Procedure: CYSTOSCOPY WITH URETEROSCOPY;  Surgeon: Milford Cage, MD;  Location: [facility];  Service: Urology;  Laterality: Left;       I & D EXTREMITY Right 02/11/2022   Procedure: IRRIGATION AND DEBRIDEMENT  OF HAND AND FOREARM;  Surgeon: Bradly Bienenstock, MD;  Location: [facility];  Service: Orthopedics;  Laterality: Right;   LEFT HEART CATH AND CORONARY ANGIOGRAPHY N/A 07/29/2018   Procedure: LEFT HEART CATH AND CORONARY ANGIOGRAPHY;  Surgeon: Marykay Lex, MD;  Location: [facility] INVASIVE CV LAB;  Service: Cardiovascular;  Laterality: N/A;   LEFT URETEROSCOPIC STONE EXTRACTION  03-14-2003   X2   PARTIAL KNEE ARTHROPLASTY Left 06/02/2022   Procedure: UNICOMPARTMENTAL KNEE;  Surgeon: Sheral Apley, MD;  Location: [facility];  Service: Orthopedics;  Laterality: Left;   Current Outpatient Medications on File Prior to Visit  Medication Sig Dispense Refill   acetaminophen (TYLENOL) 500 MG tablet Take 2 tablets (1,000 mg total) by mouth every 6 (six) hours as needed for mild pain or moderate pain. 60 tablet 0   aspirin 81 MG tablet Take 81 mg by mouth daily.     furosemide (LASIX) 20 MG tablet Take 1 tablet (20 mg total) by mouth daily. 90 tablet 2   Insulin Aspart FlexPen (NOVOLOG) 100 UNIT/ML SMARTSIG:30 Unit(s) SUB-Q Twice Daily     Insulin Pen Needle 32G X 4 MM MISC Use 4x a day 300 each 3   nitroGLYCERIN (NITROLINGUAL) 0.4 MG/SPRAY spray Place 1 spray under the tongue every 5 (five) minutes x 3 doses  as needed for chest pain. 12 g 12   pantoprazole (PROTONIX) 40 MG tablet Take 1 tablet by mouth once daily 90 tablet 0   rosuvastatin (CRESTOR) 10 MG tablet Take 1 tablet (10 mg total) by mouth daily. 90 tablet 3   Semaglutide, 1 MG/DOSE, (OZEMPIC, 1 MG/DOSE,) 4 MG/3ML SOPN Inject 1 mg into the skin once a week. 9 mL 3   spironolactone (ALDACTONE) 25 MG tablet Take 1 tablet (25 mg total) by mouth daily. 90 tablet 2   ticagrelor (BRILINTA) 60 MG TABS tablet Take 1 tablet (60 mg total) by mouth 2 (two) times daily. 180 tablet 1   TRESIBA FLEXTOUCH 200 UNIT/ML FlexTouch Pen Inject 24 Units into the skin daily. 9 mL 1   valsartan (DIOVAN) 160 MG tablet Take 1 tablet (160 mg total) by mouth daily. 90 tablet 3   isosorbide mononitrate (IMDUR) 30 MG 24 hr  tablet Take 1 tablet (30 mg total) by mouth daily. 90 tablet 3   No current facility-administered medications on file prior to visit.   Allergies  Allergen Reactions   Beta Adrenergic Blockers     Metoprolol stopped 07/2018 due to 2nd degree type 2 AV block.   Social History   Socioeconomic History   Marital status: Married    Spouse name: Not on file   Number of children: Not on file   Years of education: Not on file   Highest education level: Not on file  Occupational History   Not on file  Tobacco Use   Smoking status: Never   Smokeless tobacco: Never  Vaping Use   Vaping status: Never Used  Substance and Sexual Activity   Alcohol use: Never   Drug use: Never   Sexual activity: Not Currently  Other Topics Concern   Not on file  Social History Narrative   Right handed    Lives with wife    Social Determinants of Health   Financial Resource Strain: Low Risk  (12/08/2022)   Overall Financial Resource Strain (CARDIA)    Difficulty of Paying Living Expenses: Not hard at all  Food Insecurity: No Food Insecurity (12/08/2022)   Hunger Vital Sign    Worried About Running Out of Food in the Last Year: Never true    Ran Out of Food  in the Last Year: Never true  Transportation Needs: No Transportation Needs (12/08/2022)   PRAPARE - Administrator, Civil Service (Medical): No    Lack of Transportation (Non-Medical): No  Physical Activity: Inactive (12/08/2022)   Exercise Vital Sign    Days of Exercise per Week: 0 days    Minutes of Exercise per Session: 0 min  Stress: No Stress Concern Present (12/08/2022)   [employer] of Occupational Health - Occupational Stress Questionnaire    Feeling of Stress : Not at all  Social Connections: Moderately Integrated (12/08/2022)   Social Connection and Isolation Panel [NHANES]    Frequency of Communication with Friends and Family: More than three times a week    Frequency of Social Gatherings with Friends and Family: Three times a week    Attends Religious Services: 1 to 4 times per year    Active Member of Clubs or Organizations: No    Attends Banker Meetings: Never    Marital Status: Married  Catering manager Violence: Not At Risk (12/08/2022)   Humiliation, Afraid, Rape, and Kick questionnaire    Fear of Current or Ex-Partner: No    Emotionally Abused: No    Physically Abused: No    Sexually Abused: No      Review of Systems  All other systems reviewed and are negative.      Objective:   Physical Exam Vitals reviewed.  Constitutional:      General: He is not in acute distress.    Appearance: He is well-developed. He is obese. He is not ill-appearing, toxic-appearing or diaphoretic.  HENT:     Head: Normocephalic and atraumatic.     Right Ear: Tympanic membrane and ear canal normal.     Left Ear: Ear canal normal.     Nose: Nose normal. No congestion or rhinorrhea.  Eyes:     General: No scleral icterus.       Right eye: No discharge.        Left eye: No discharge.     Extraocular Movements: Extraocular movements intact.  Conjunctiva/sclera: Conjunctivae normal.     Pupils: Pupils are equal, round, and reactive to light.   Neck:     Thyroid: No thyromegaly.     Vascular: No JVD.  Cardiovascular:     Rate and Rhythm: Normal rate and regular rhythm.     Heart sounds: Normal heart sounds. No murmur heard. Pulmonary:     Effort: Pulmonary effort is normal. No respiratory distress.     Breath sounds: Normal breath sounds. No wheezing or rales.  Chest:     Chest wall: No tenderness.  Abdominal:     General: Bowel sounds are normal. There is no distension.     Palpations: Abdomen is soft. There is no mass.     Tenderness: There is no abdominal tenderness. There is no guarding or rebound.  Musculoskeletal:     Cervical back: Neck supple.  Lymphadenopathy:     Cervical: No cervical adenopathy.  Skin:    Findings: No erythema or rash.  Neurological:     General: No focal deficit present.     Mental Status: He is alert and oriented to person, place, and time. Mental status is at baseline.     Cranial Nerves: No cranial nerve deficit.     Sensory: No sensory deficit.     Motor: No weakness.     Coordination: Coordination abnormal.     Gait: Gait normal.     Deep Tendon Reflexes: Reflexes normal.  Psychiatric:        Mood and Affect: Mood normal.        Behavior: Behavior normal.        Thought Content: Thought content normal.        Judgment: Judgment normal.       Assessment & Plan:   Tick bite, unspecified site, initial encounter - Plan: B. burgdorfi antibodies by WB  Bilateral carpal tunnel syndrome - Plan: Ambulatory referral to Neurology  Prostate cancer screening - Plan: PSA  Encounter for Medicare annual wellness exam  Type 2 diabetes mellitus with stage 3a chronic kidney disease, with long-term current use of insulin (HCC)  Benign essential HTN  Pure hypercholesterolemia Diabetic foot exam was performed today is normal.  Recommended flu shot this fall.  The remainder of his immunizations are up-to-date.  Recommended an annual diabetic eye exam.  A1c is elevated.  Recommended  increasing Ozempic to 2 mg subcu weekly to facilitate weight loss and help improve his A1c.  I believe the weight loss would help also with his dyspnea on exertion.  Encouraged him to contact cardiology and let them know that the dyspnea on exertion is worsening as they may want to pursue left heart catheterization as mentioned in their visit from May.  Blood pressure today is well-controlled.  Cholesterol is well-controlled.  I will consult neurology for nerve conduction studies as I believe the patient is dealing with carpal tunnel syndrome.  Also screen for Lyme disease with tick titers.  Screen for prostate cancer with a PSA.  Colon cancer screening is up-to-date.

## 2023-07-14 ENCOUNTER — Other Ambulatory Visit: Payer: Self-pay

## 2023-07-14 ENCOUNTER — Encounter: Payer: Self-pay | Admitting: Neurology

## 2023-07-14 DIAGNOSIS — R202 Paresthesia of skin: Secondary | ICD-10-CM

## 2023-07-15 NOTE — Addendum Note (Signed)
Addended by: Lynnea Ferrier T on: 07/15/2023 12:15 PM   Modules accepted: Level of Service

## 2023-07-20 LAB — B. BURGDORFI ANTIBODIES BY WB
B burgdorferi IgG Abs (IB): NEGATIVE
B burgdorferi IgM Abs (IB): NEGATIVE
Lyme Disease 18 kD IgG: NONREACTIVE
Lyme Disease 23 kD IgG: NONREACTIVE
Lyme Disease 23 kD IgM: NONREACTIVE
Lyme Disease 28 kD IgG: NONREACTIVE
Lyme Disease 30 kD IgG: NONREACTIVE
Lyme Disease 39 kD IgG: NONREACTIVE
Lyme Disease 39 kD IgM: NONREACTIVE
Lyme Disease 41 kD IgG: NONREACTIVE
Lyme Disease 41 kD IgM: NONREACTIVE
Lyme Disease 45 kD IgG: NONREACTIVE
Lyme Disease 58 kD IgG: REACTIVE — AB
Lyme Disease 66 kD IgG: NONREACTIVE
Lyme Disease 93 kD IgG: NONREACTIVE

## 2023-07-20 LAB — PSA: PSA: 1.05 ng/mL (ref <=4.00)

## 2023-07-22 NOTE — H&P (View-Only) (Signed)
in size.  First Right Posterolateral Branch Vessel is small in size.  Intervention  Ost LAD lesion Stent (Also treats lesions: Prox LAD) Lesion length:  6 mm. CATH VISTA GUIDE 6FR XBLAD3.5 guide catheter was inserted. Lesion crossed with guidewire using a WIRE ASAHI PROWATER 180CM. Pre-stent angioplasty was performed using a BALLOON WOLVERINE 3.00X10. Maximum pressure:  14 atm. Inflation time:  20 sec. 1: BALLOON EMERGE MR 2.5X12 (40981) -10 atm, would not expand focal lesion A drug-eluting stent was successfully placed using a STENT ORSIRO 3.5X13. Maximum pressure: 16 atm. Inflation time: 20 sec. Minimum lumen area:  4.1 mm. Stent strut is well apposed. Stent overlaps previously placed stent. Post-stent angioplasty was performed using a BALLOON [place] EMERGE MR  4.0X8. Maximum pressure:  16 atm. Inflation time:  20 sec. Initially used 3.75 mm  X 8 balloon 18 Atm x 20 sec Post-Intervention Lesion Assessment The intervention was successful. Pre-interventional TIMI flow is 2. Post-intervention TIMI flow is 3. Treated lesion length:  10 mm. No complications occurred at this lesion. There is a 0% residual stenosis post intervention.  Prox LAD lesion Stent (Also treats lesions: Ost LAD) See details in Ost LAD lesion. Post-Intervention Lesion Assessment The intervention was successful. There is a 5% residual stenosis post intervention.   STRESS TESTS  MYOCARDIAL PERFUSION IMAGING 04/07/2023  Narrative   Findings are consistent with infarction with peri-infarct ischemia. The study is intermediate risk.   No ST deviation was noted.   Left ventricular function is normal. Nuclear stress EF: 48 %. The left ventricular ejection fraction is mildly decreased (45-54%). End diastolic cavity size is normal.   Prior study available for comparison from 09/24/2021.  Mild inferior wall defect from the base to the mid wall at rest that worsens with stress consistent with inferior infarct with peri-infarct ischemia.   ECHOCARDIOGRAM  ECHOCARDIOGRAM COMPLETE 04/06/2023  Narrative ECHOCARDIOGRAM REPORT    Patient Name:   Erik Hancock Date of Exam: 04/06/2023 Medical Rec #:  [id number]      Height:       69.0 in Accession #:    [id number]     Weight:       281.6 lb Date of Birth:  02-15-1951      BSA:          2.389 m Patient Age:    72 years       BP:           128/68 mmHg Patient Gender: M              HR:           62 bpm. Exam Location:  [address]  Procedure: 2D Echo, 3D Echo, Cardiac Doppler, Color Doppler and Strain Analysis  Indications:    R07.9 Chest Pain  History:        Patient has prior history of Echocardiogram examinations, most recent 02/12/2022. CAD, Signs/Symptoms:Chest Pain and Shortness of Breath; Risk Factors:Hypertension, Diabetes,  Dyslipidemia, Sleep Apnea and Family History of Coronary Artery Disease. Chronic Kidney Disease, Obesity.  Sonographer:    Farrel Conners RDCS Referring Phys: TIFFANY Hatton  IMPRESSIONS   1. Left ventricular ejection fraction, by estimation, is 60 to 65%. The left ventricle has normal function. The left ventricle has no regional wall motion abnormalities. There is mild left ventricular hypertrophy. Left ventricular diastolic parameters are consistent with Grade I diastolic dysfunction (impaired relaxation). The average left ventricular global longitudinal strain is -20.6 %. The global longitudinal strain is normal. 2.  Cardiology Office Note:  .   Date:  07/23/2023  ID:  EULIS AHLQUIST, DOB 1950/12/26, MRN [id number] PCP: Donita Brooks, MD  [facility] Providers Cardiologist:  Chilton Si, MD    History of Present Illness: .   Erik Hancock is a 72 y.o. male with a hx of CAD s/p PCI, mild ascending aortic aneurysm, OSA, DVTs, hypertension, hyperlipidemia.   2012 he had angina and underwent PCI of the LAD.  Echo 01/2014 LVEF 60 to 65%, moderately dilated RV.     Established with Dr. Duke Salvia October 2019 due to exertional dyspnea.  It was similar to when he previously required coronary stenting. Underwent LHC at that time revealing three-vessel CAD with stents in the LAD, left circumflex, PDA.  He was noted 95% ostial LAD lesion that was successfully treated with scoring balloon angioplasty and DES.  He had a sleep study that was positive and felt he would benefit from BiPAP but he declined.  02/2020 Brilinta was reduced to 60 mg twice daily.  October 2022 reported atypical chest pain with nuclear stress test 09/2021 with low risk EF 56%.  Echo 09/2021 grade 1 diastolic dysfunction and ascending aorta 3.8 cm.  He had home sleep study and CPAP was recommended.  Later started on valsartan due to poorly controlled blood pressure.  He had ABIs 01/2022 and arteries were noncompressible.  Echo at that time LVEF 60 to 65%, moderate LVH, ascending aorta 3.8 cm, RA pressure 8 mmHg.  Underwent left knee replacement 05/2022.  Has previously been able to tolerate AV nodal agents due to bradycardia or thiazide diuretics due to hypokalemia.  Given ankle edema amlodipine previously deferred.   Seen 03/05/2023 at which time he noted intermittent exertional dyspnea.  He had 1 episode of chest pain the month prior characterized by aching pain not associate with shortness of breath.  He also noted lower extremity edema for which furosemide was started.   Echo 04/06/2023 normal LVEF, impaired diastolic function, ascending  aorta 41 mm recommended for monitoring in 1 year.  Myoview 5/50/24 with intermediate risk with EF 48% (likely falsely low) and mild inferior wall defect from the base to the mid wall at rest which worsens with consistent with inferior infarct with peri-infarct ischemia.   Last seen 04/09/23. Valsartan decreased by half to allow addition of Imdur 30mg  daily due to dyspnea concerning for anginal equivalent.   Presents today for follow up with his wife. 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 No chest pain, pressure, tightness, edema.   ROS: Please see the history of present illness.    All other systems reviewed and are negative.   Studies Reviewed: [name]   EKG Interpretation Date/Time:  Friday July 23 2023 09:27:22 EDT Ventricular Rate:  86 PR Interval:  388 QRS Duration:  98 QT Interval:  364 QTC Calculation: 436 R Axis:   -23  Text Interpretation: NSR with first degree AV block Stable compared to previous. No acute changes Confirmed by Gillian Shields ([id number]) on 07/23/2023 9:32:55 AM    Cardiac Studies & Procedures   CARDIAC CATHETERIZATION  CARDIAC CATHETERIZATION 07/29/2018  Narrative Images from the original result were not included.   Ost LAD lesion is 85% stenosed -just prior to previous stent. Previously placed prox LAD  Cardiology Office Note:  .   Date:  07/23/2023  ID:  EULIS AHLQUIST, DOB 1950/12/26, MRN [id number] PCP: Donita Brooks, MD  [facility] Providers Cardiologist:  Chilton Si, MD    History of Present Illness: .   Erik Hancock is a 72 y.o. male with a hx of CAD s/p PCI, mild ascending aortic aneurysm, OSA, DVTs, hypertension, hyperlipidemia.   2012 he had angina and underwent PCI of the LAD.  Echo 01/2014 LVEF 60 to 65%, moderately dilated RV.     Established with Dr. Duke Salvia October 2019 due to exertional dyspnea.  It was similar to when he previously required coronary stenting. Underwent LHC at that time revealing three-vessel CAD with stents in the LAD, left circumflex, PDA.  He was noted 95% ostial LAD lesion that was successfully treated with scoring balloon angioplasty and DES.  He had a sleep study that was positive and felt he would benefit from BiPAP but he declined.  02/2020 Brilinta was reduced to 60 mg twice daily.  October 2022 reported atypical chest pain with nuclear stress test 09/2021 with low risk EF 56%.  Echo 09/2021 grade 1 diastolic dysfunction and ascending aorta 3.8 cm.  He had home sleep study and CPAP was recommended.  Later started on valsartan due to poorly controlled blood pressure.  He had ABIs 01/2022 and arteries were noncompressible.  Echo at that time LVEF 60 to 65%, moderate LVH, ascending aorta 3.8 cm, RA pressure 8 mmHg.  Underwent left knee replacement 05/2022.  Has previously been able to tolerate AV nodal agents due to bradycardia or thiazide diuretics due to hypokalemia.  Given ankle edema amlodipine previously deferred.   Seen 03/05/2023 at which time he noted intermittent exertional dyspnea.  He had 1 episode of chest pain the month prior characterized by aching pain not associate with shortness of breath.  He also noted lower extremity edema for which furosemide was started.   Echo 04/06/2023 normal LVEF, impaired diastolic function, ascending  aorta 41 mm recommended for monitoring in 1 year.  Myoview 5/50/24 with intermediate risk with EF 48% (likely falsely low) and mild inferior wall defect from the base to the mid wall at rest which worsens with consistent with inferior infarct with peri-infarct ischemia.   Last seen 04/09/23. Valsartan decreased by half to allow addition of Imdur 30mg  daily due to dyspnea concerning for anginal equivalent.   Presents today for follow up with his wife. 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 No chest pain, pressure, tightness, edema.   ROS: Please see the history of present illness.    All other systems reviewed and are negative.   Studies Reviewed: [name]   EKG Interpretation Date/Time:  Friday July 23 2023 09:27:22 EDT Ventricular Rate:  86 PR Interval:  388 QRS Duration:  98 QT Interval:  364 QTC Calculation: 436 R Axis:   -23  Text Interpretation: NSR with first degree AV block Stable compared to previous. No acute changes Confirmed by Gillian Shields ([id number]) on 07/23/2023 9:32:55 AM    Cardiac Studies & Procedures   CARDIAC CATHETERIZATION  CARDIAC CATHETERIZATION 07/29/2018  Narrative Images from the original result were not included.   Ost LAD lesion is 85% stenosed -just prior to previous stent. Previously placed prox LAD  in size.  First Right Posterolateral Branch Vessel is small in size.  Intervention  Ost LAD lesion Stent (Also treats lesions: Prox LAD) Lesion length:  6 mm. CATH VISTA GUIDE 6FR XBLAD3.5 guide catheter was inserted. Lesion crossed with guidewire using a WIRE ASAHI PROWATER 180CM. Pre-stent angioplasty was performed using a BALLOON [place] 3.00X10. Maximum pressure:  14 atm. Inflation time:  20 sec. 1: BALLOON EMERGE MR 2.5X12 (40981) -10 atm, would not expand focal lesion A drug-eluting stent was successfully placed using a STENT ORSIRO 3.5X13. Maximum pressure: 16 atm. Inflation time: 20 sec. Minimum lumen area:  4.1 mm. Stent strut is well apposed. Stent overlaps previously placed stent. Post-stent angioplasty was performed using a BALLOON [place] EMERGE MR  4.0X8. Maximum pressure:  16 atm. Inflation time:  20 sec. Initially used 3.75 mm  X 8 balloon 18 Atm x 20 sec Post-Intervention Lesion Assessment The intervention was successful. Pre-interventional TIMI flow is 2. Post-intervention TIMI flow is 3. Treated lesion length:  10 mm. No complications occurred at this lesion. There is a 0% residual stenosis post intervention.  Prox LAD lesion Stent (Also treats lesions: Ost LAD) See details in Ost LAD lesion. Post-Intervention Lesion Assessment The intervention was successful. There is a 5% residual stenosis post intervention.   STRESS TESTS  MYOCARDIAL PERFUSION IMAGING 04/07/2023  Narrative   Findings are consistent with infarction with peri-infarct ischemia. The study is intermediate risk.   No ST deviation was noted.   Left ventricular function is normal. Nuclear stress EF: 48 %. The left ventricular ejection fraction is mildly decreased (45-54%). End diastolic cavity size is normal.   Prior study available for comparison from 09/24/2021.  Mild inferior wall defect from the base to the mid wall at rest that worsens with stress consistent with inferior infarct with peri-infarct ischemia.   ECHOCARDIOGRAM  ECHOCARDIOGRAM COMPLETE 04/06/2023  Narrative ECHOCARDIOGRAM REPORT    Patient Name:   Erik Hancock Date of Exam: 04/06/2023 Medical Rec #:  [id number]      Height:       69.0 in Accession #:    [id number]     Weight:       281.6 lb Date of Birth:  02-15-1951      BSA:          2.389 m Patient Age:    72 years       BP:           128/68 mmHg Patient Gender: M              HR:           62 bpm. Exam Location:  [address]  Procedure: 2D Echo, 3D Echo, Cardiac Doppler, Color Doppler and Strain Analysis  Indications:    R07.9 Chest Pain  History:        Patient has prior history of Echocardiogram examinations, most recent 02/12/2022. CAD, Signs/Symptoms:Chest Pain and Shortness of Breath; Risk Factors:Hypertension, Diabetes,  Dyslipidemia, Sleep Apnea and Family History of Coronary Artery Disease. Chronic Kidney Disease, Obesity.  Sonographer:    Farrel Conners RDCS Referring Phys: TIFFANY Hatton  IMPRESSIONS   1. Left ventricular ejection fraction, by estimation, is 60 to 65%. The left ventricle has normal function. The left ventricle has no regional wall motion abnormalities. There is mild left ventricular hypertrophy. Left ventricular diastolic parameters are consistent with Grade I diastolic dysfunction (impaired relaxation). The average left ventricular global longitudinal strain is -20.6 %. The global longitudinal strain is normal. 2.  in size.  First Right Posterolateral Branch Vessel is small in size.  Intervention  Ost LAD lesion Stent (Also treats lesions: Prox LAD) Lesion length:  6 mm. CATH VISTA GUIDE 6FR XBLAD3.5 guide catheter was inserted. Lesion crossed with guidewire using a WIRE ASAHI PROWATER 180CM. Pre-stent angioplasty was performed using a BALLOON WOLVERINE 3.00X10. Maximum pressure:  14 atm. Inflation time:  20 sec. 1: BALLOON EMERGE MR 2.5X12 (40981) -10 atm, would not expand focal lesion A drug-eluting stent was successfully placed using a STENT ORSIRO 3.5X13. Maximum pressure: 16 atm. Inflation time: 20 sec. Minimum lumen area:  4.1 mm. Stent strut is well apposed. Stent overlaps previously placed stent. Post-stent angioplasty was performed using a BALLOON [place] EMERGE MR  4.0X8. Maximum pressure:  16 atm. Inflation time:  20 sec. Initially used 3.75 mm  X 8 balloon 18 Atm x 20 sec Post-Intervention Lesion Assessment The intervention was successful. Pre-interventional TIMI flow is 2. Post-intervention TIMI flow is 3. Treated lesion length:  10 mm. No complications occurred at this lesion. There is a 0% residual stenosis post intervention.  Prox LAD lesion Stent (Also treats lesions: Ost LAD) See details in Ost LAD lesion. Post-Intervention Lesion Assessment The intervention was successful. There is a 5% residual stenosis post intervention.   STRESS TESTS  MYOCARDIAL PERFUSION IMAGING 04/07/2023  Narrative   Findings are consistent with infarction with peri-infarct ischemia. The study is intermediate risk.   No ST deviation was noted.   Left ventricular function is normal. Nuclear stress EF: 48 %. The left ventricular ejection fraction is mildly decreased (45-54%). End diastolic cavity size is normal.   Prior study available for comparison from 09/24/2021.  Mild inferior wall defect from the base to the mid wall at rest that worsens with stress consistent with inferior infarct with peri-infarct ischemia.   ECHOCARDIOGRAM  ECHOCARDIOGRAM COMPLETE 04/06/2023  Narrative ECHOCARDIOGRAM REPORT    Patient Name:   Erik Hancock Date of Exam: 04/06/2023 Medical Rec #:  [id number]      Height:       69.0 in Accession #:    [id number]     Weight:       281.6 lb Date of Birth:  02-15-1951      BSA:          2.389 m Patient Age:    72 years       BP:           128/68 mmHg Patient Gender: M              HR:           62 bpm. Exam Location:  [address]  Procedure: 2D Echo, 3D Echo, Cardiac Doppler, Color Doppler and Strain Analysis  Indications:    R07.9 Chest Pain  History:        Patient has prior history of Echocardiogram examinations, most recent 02/12/2022. CAD, Signs/Symptoms:Chest Pain and Shortness of Breath; Risk Factors:Hypertension, Diabetes,  Dyslipidemia, Sleep Apnea and Family History of Coronary Artery Disease. Chronic Kidney Disease, Obesity.  Sonographer:    Farrel Conners RDCS Referring Phys: TIFFANY Hatton  IMPRESSIONS   1. Left ventricular ejection fraction, by estimation, is 60 to 65%. The left ventricle has normal function. The left ventricle has no regional wall motion abnormalities. There is mild left ventricular hypertrophy. Left ventricular diastolic parameters are consistent with Grade I diastolic dysfunction (impaired relaxation). The average left ventricular global longitudinal strain is -20.6 %. The global longitudinal strain is normal. 2.  in size.  First Right Posterolateral Branch Vessel is small in size.  Intervention  Ost LAD lesion Stent (Also treats lesions: Prox LAD) Lesion length:  6 mm. CATH VISTA GUIDE 6FR XBLAD3.5 guide catheter was inserted. Lesion crossed with guidewire using a WIRE ASAHI PROWATER 180CM. Pre-stent angioplasty was performed using a BALLOON [place]OL[place]INE 3.00X10. Maximum pressure:  14 atm. Inflation time:  20 sec. 1: BALLOON EMERGE MR 2.5X12 (40981) -10 atm, would not expand focal lesion A drug-eluting stent was successfully placed using a STENT ORSIRO 3.5X13. Maximum pressure: 16 atm. Inflation time: 20 sec. Minimum lumen area:  4.1 mm. Stent strut is well apposed. Stent overlaps previously placed stent. Post-stent angioplasty was performed using a BALLOON [place] EMERGE MR  4.0X8. Maximum pressure:  16 atm. Inflation time:  20 sec. Initially used 3.75 mm  X 8 balloon 18 Atm x 20 sec Post-Intervention Lesion Assessment The intervention was successful. Pre-interventional TIMI flow is 2. Post-intervention TIMI flow is 3. Treated lesion length:  10 mm. No complications occurred at this lesion. There is a 0% residual stenosis post intervention.  Prox LAD lesion Stent (Also treats lesions: Ost LAD) See details in Ost LAD lesion. Post-Intervention Lesion Assessment The intervention was successful. There is a 5% residual stenosis post intervention.   STRESS TESTS  MYOCARDIAL PERFUSION IMAGING 04/07/2023  Narrative   Findings are consistent with infarction with peri-infarct ischemia. The study is intermediate risk.   No ST deviation was noted.   Left ventricular function is normal. Nuclear stress EF: 48 %. The left ventricular ejection fraction is mildly decreased (45-54%). End diastolic cavity size is normal.   Prior study available for comparison from 09/24/2021.  Mild inferior wall defect from the base to the mid wall at rest that worsens with stress consistent with inferior infarct with peri-infarct ischemia.   ECHOCARDIOGRAM  ECHOCARDIOGRAM COMPLETE 04/06/2023  Narrative ECHOCARDIOGRAM REPORT    Patient Name:   Erik Hancock Date of Exam: 04/06/2023 Medical Rec #:  [id number]      Height:       69.0 in Accession #:    [id number]     Weight:       281.6 lb Date of Birth:  02-15-1951      BSA:          2.389 m Patient Age:    72 years       BP:           128/68 mmHg Patient Gender: M              HR:           62 bpm. Exam Location:  [address]  Procedure: 2D Echo, 3D Echo, Cardiac Doppler, Color Doppler and Strain Analysis  Indications:    R07.9 Chest Pain  History:        Patient has prior history of Echocardiogram examinations, most recent 02/12/2022. CAD, Signs/Symptoms:Chest Pain and Shortness of Breath; Risk Factors:Hypertension, Diabetes,  Dyslipidemia, Sleep Apnea and Family History of Coronary Artery Disease. Chronic Kidney Disease, Obesity.  Sonographer:    Farrel Conners RDCS Referring Phys: TIFFANY Hatton  IMPRESSIONS   1. Left ventricular ejection fraction, by estimation, is 60 to 65%. The left ventricle has normal function. The left ventricle has no regional wall motion abnormalities. There is mild left ventricular hypertrophy. Left ventricular diastolic parameters are consistent with Grade I diastolic dysfunction (impaired relaxation). The average left ventricular global longitudinal strain is -20.6 %. The global longitudinal strain is normal. 2.

## 2023-07-22 NOTE — Progress Notes (Signed)
Cardiology Office Note:  .   Date:  07/23/2023  ID:  Erik Hancock, DOB 1951/03/09, MRN [id number] PCP: Donita Brooks, MD  [facility] Providers Cardiologist:  Chilton Si, MD    History of Present Illness: .   Erik Hancock is a 72 y.o. male with a hx of CAD s/p PCI, mild ascending aortic aneurysm, OSA, DVTs, hypertension, hyperlipidemia.   2012 he had angina and underwent PCI of the LAD.  Echo 01/2014 LVEF 60 to 65%, moderately dilated RV.     Established with Dr. Duke Salvia October 2019 due to exertional dyspnea.  It was similar to when he previously required coronary stenting. Underwent LHC at that time revealing three-vessel CAD with stents in the LAD, left circumflex, PDA.  He was noted 95% ostial LAD lesion that was successfully treated with scoring balloon angioplasty and DES.  He had a sleep study that was positive and felt he would benefit from BiPAP but he declined.  02/2020 Brilinta was reduced to 60 mg twice daily.  October 2022 reported atypical chest pain with nuclear stress test 09/2021 with low risk EF 56%.  Echo 09/2021 grade 1 diastolic dysfunction and ascending aorta 3.8 cm.  He had home sleep study and CPAP was recommended.  Later started on valsartan due to poorly controlled blood pressure.  He had ABIs 01/2022 and arteries were noncompressible.  Echo at that time LVEF 60 to 65%, moderate LVH, ascending aorta 3.8 cm, RA pressure 8 mmHg.  Underwent left knee replacement 05/2022.  Has previously been able to tolerate AV nodal agents due to bradycardia or thiazide diuretics due to hypokalemia.  Given ankle edema amlodipine previously deferred.   Seen 03/05/2023 at which time he noted intermittent exertional dyspnea.  He had 1 episode of chest pain the month prior characterized by aching pain not associate with shortness of breath.  He also noted lower extremity edema for which furosemide was started.   Echo 04/06/2023 normal LVEF, impaired diastolic function, ascending  aorta 41 mm recommended for monitoring in 1 year.  Myoview 5/50/24 with intermediate risk with EF 48% (likely falsely low) and mild inferior wall defect from the base to the mid wall at rest which worsens with consistent with inferior infarct with peri-infarct ischemia.   Last seen 04/09/23. Valsartan decreased by half to allow addition of Imdur 30mg  daily due to dyspnea concerning for anginal equivalent.   Presents today for follow up with his wife. 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 No chest pain, pressure, tightness, edema.   ROS: Please see the history of present illness.    All other systems reviewed and are negative.   Studies Reviewed: [name]   EKG Interpretation Date/Time:  Friday July 23 2023 09:27:22 EDT Ventricular Rate:  86 PR Interval:  388 QRS Duration:  98 QT Interval:  364 QTC Calculation: 436 R Axis:   -23  Text Interpretation: NSR with first degree AV block Stable compared to previous. No acute changes Confirmed by Gillian Shields ([id number]) on 07/23/2023 9:32:55 AM    Cardiac Studies & Procedures   CARDIAC CATHETERIZATION  CARDIAC CATHETERIZATION 07/29/2018  Narrative Images from the original result were not included.   Ost LAD lesion is 85% stenosed -just prior to previous stent. Previously placed prox LAD  DES stent is 5% stenosed.  Following scoring balloon angioplasty, A drug-eluting stent was successfully placed overlapping the previous stent proximally, using a STENT ORSIRO 3.5X13 --postdilated to 4.0 mm  Post intervention, there is a 0% residual stenosis.  Previously placed Prox Cx to Dist Cx stent (DES) is widely patent. Ost 2nd Mrg lesion is 40% stenosed.  Ost Cx to Prox Cx lesion is 30% stenosed.  Previously placed RPDA-1 stent (DES) is widely  patent. RPDA-2 lesion is 40% stenosed just beyond stent.  The left ventricular systolic function is normal. The left ventricular ejection fraction is 55-65% by visual estimate. LV end diastolic pressure is normal.  Post intervention, there is a 5% residual stenosis.  A drug-eluting stent was successfully placed using a STENT ORSIRO 3.5X13.  Three-vessel CAD with stents in the LAD, circumflex and PDA. Culprit lesion is severe 95% proximal edge lesion and ostial LAD -> successfully treated with scoring balloon angioplasty and DES stent overlapping the original stent (STENT ORSIRO 3.5X13 -postdilated to 4.0 mm) Patent stents in circumflex and RPDA Normal LV function with normal LVEDP.   As the patient has an ostial LAD stent, I felt it more prudent to monitor the patient overnight as opposed to same-day discharge.  He will be transferred to postprocedure unit for ongoing care.  Continue home medications with exception of his diabetes medications (will cover with sliding scale insulin)  Recommend dual antiplatelet therapy with Aspirin 81mg  daily and Ticagrelor 90mg  twice daily long-term (beyond 12 months) because of Ostial LAD stent.  Would be okay to stop aspirin after 3 to 6 months, but would continue Brilinta at 90 mg for 1 year and then reduced to 60 mg/convert to Plavix 75 mg after 1 year.[name]   He will follow-up with Dr. Chilton Si.    Bryan Lemma, M.D., M.S. Interventional Cardiologist  Pager # [phone] Phone # [phone] [address] [place], [place] [address]  Findings Coronary Findings Diagnostic  Dominance: Right  Left Main Vessel is large.  Left Anterior Descending Ost LAD lesion is 85% stenosed. The lesion is located proximal to the major branch and focal. Just proximal to stent The lesion is mildly calcified. Prox LAD lesion is 5% stenosed. The lesion is located proximal to the major branch. The lesion was previously treated using a drug  eluting stent over 2 years ago. Taxus Express 3.5 x 15 Previously placed stent displays restenosis.  First Diagonal Branch Vessel is small in size.  First Septal Branch Vessel is small in size.  Second Diagonal Branch Vessel is moderate in size.  Third Diagonal Branch Vessel is small in size.  Third Septal Branch Vessel is small in size.  Left Circumflex Vessel is large. Ost Cx to Prox Cx lesion is 30% stenosed. The lesion is segmental and irregular. Previously placed Prox Cx to Dist Cx stent (unknown type) is widely patent. Taxus DES 3.0 x 20 -&gt; 3.25 mm 2007  First Obtuse Marginal Branch Vessel is small in size.  Second Obtuse Marginal Branch Vessel is large in size. Ost 2nd Mrg lesion is 40% stenosed.  Third Obtuse Marginal Branch Vessel is small in size.  Right Coronary Artery  Acute Marginal Branch Vessel is small in size.  Right Posterior Descending Artery Vessel is large in size. Previously placed RPDA-1 stent (unknown type) is widely patent. Taxus DES 3.0 x 16 - 3.5 mm, RPDA-2 lesion is 40% stenosed.  Inferior Septal Vessel is small in size.  Right Posterior Atrioventricular Artery Vessel is small  in size.  First Right Posterolateral Branch Vessel is small in size.  Intervention  Ost LAD lesion Stent (Also treats lesions: Prox LAD) Lesion length:  6 mm. CATH VISTA GUIDE 6FR XBLAD3.5 guide catheter was inserted. Lesion crossed with guidewire using a WIRE ASAHI PROWATER 180CM. Pre-stent angioplasty was performed using a BALLOON WOLVERINE 3.00X10. Maximum pressure:  14 atm. Inflation time:  20 sec. 1: BALLOON EMERGE MR 2.5X12 ([id number]) -10 atm, would not expand focal lesion A drug-eluting stent was successfully placed using a STENT ORSIRO 3.5X13. Maximum pressure: 16 atm. Inflation time: 20 sec. Minimum lumen area:  4.1 mm. Stent strut is well apposed. Stent overlaps previously placed stent. Post-stent angioplasty was performed using a BALLOON Minong EMERGE MR  4.0X8. Maximum pressure:  16 atm. Inflation time:  20 sec. Initially used 3.75 mm  X 8 balloon 18 Atm x 20 sec Post-Intervention Lesion Assessment The intervention was successful. Pre-interventional TIMI flow is 2. Post-intervention TIMI flow is 3. Treated lesion length:  10 mm. No complications occurred at this lesion. There is a 0% residual stenosis post intervention.  Prox LAD lesion Stent (Also treats lesions: Ost LAD) See details in Ost LAD lesion. Post-Intervention Lesion Assessment The intervention was successful. There is a 5% residual stenosis post intervention.   STRESS TESTS  MYOCARDIAL PERFUSION IMAGING 04/07/2023  Narrative   Findings are consistent with infarction with peri-infarct ischemia. The study is intermediate risk.   No ST deviation was noted.   Left ventricular function is normal. Nuclear stress EF: 48 %. The left ventricular ejection fraction is mildly decreased (45-54%). End diastolic cavity size is normal.   Prior study available for comparison from 09/24/2021.  Mild inferior wall defect from the base to the mid wall at rest that worsens with stress consistent with inferior infarct with peri-infarct ischemia.   ECHOCARDIOGRAM  ECHOCARDIOGRAM COMPLETE 04/06/2023  Narrative ECHOCARDIOGRAM REPORT    Patient Name:   Erik Hancock Date of Exam: 04/06/2023 Medical Rec #:  [id number]      Height:       69.0 in Accession #:    [id number]     Weight:       281.6 lb Date of Birth:  1951/02/13      BSA:          2.389 m Patient Age:    72 years       BP:           128/68 mmHg Patient Gender: M              HR:           62 bpm. Exam Location:  [address]  Procedure: 2D Echo, 3D Echo, Cardiac Doppler, Color Doppler and Strain Analysis  Indications:    R07.9 Chest Pain  History:        Patient has prior history of Echocardiogram examinations, most recent 02/12/2022. CAD, Signs/Symptoms:Chest Pain and Shortness of Breath; Risk Factors:Hypertension, Diabetes,  Dyslipidemia, Sleep Apnea and Family History of Coronary Artery Disease. Chronic Kidney Disease, Obesity.  Sonographer:    Farrel Conners RDCS Referring Phys: TIFFANY Waterproof  IMPRESSIONS   1. Left ventricular ejection fraction, by estimation, is 60 to 65%. The left ventricle has normal function. The left ventricle has no regional wall motion abnormalities. There is mild left ventricular hypertrophy. Left ventricular diastolic parameters are consistent with Grade I diastolic dysfunction (impaired relaxation). The average left ventricular global longitudinal strain is -20.6 %. The global longitudinal strain is normal. 2.  Right ventricular systolic function is normal. The right ventricular size is normal. 3. Left atrial size was moderately dilated. 4. Cannot r/o small PFO on limited views provided. 5. Right atrial size was mildly dilated. 6. The mitral valve is abnormal. Trivial mitral valve regurgitation. No evidence of mitral stenosis. 7. The aortic valve is tricuspid. There is mild calcification of the aortic valve. There is mild thickening of the aortic valve. Aortic valve regurgitation is trivial. Aortic valve sclerosis is present, with no evidence of aortic valve stenosis. 8. Aortic dilatation noted. There is mild dilatation of the ascending aorta, measuring 41 mm. 9. The inferior vena cava is normal in size with greater than 50% respiratory variability, suggesting right atrial pressure of 3 mmHg.  FINDINGS Left Ventricle: Left ventricular ejection fraction, by estimation, is 60 to 65%. The left ventricle has normal function. The left ventricle has no regional wall motion abnormalities. The average left ventricular global longitudinal strain is -20.6 %. The global longitudinal strain is normal. The left ventricular internal cavity size was normal in size. There is mild left ventricular hypertrophy. Left ventricular diastolic parameters are consistent with Grade I diastolic dysfunction  (impaired relaxation).  Right Ventricle: The right ventricular size is normal. No increase in right ventricular wall thickness. Right ventricular systolic function is normal.  Left Atrium: Left atrial size was moderately dilated.  Right Atrium: Right atrial size was mildly dilated.  Pericardium: There is no evidence of pericardial effusion.  Mitral Valve: The mitral valve is abnormal. There is mild thickening of the mitral valve leaflet(s). There is mild calcification of the mitral valve leaflet(s). Mild mitral annular calcification. Trivial mitral valve regurgitation. No evidence of mitral valve stenosis.  Tricuspid Valve: The tricuspid valve is normal in structure. Tricuspid valve regurgitation is mild . No evidence of tricuspid stenosis.  Aortic Valve: The aortic valve is tricuspid. There is mild calcification of the aortic valve. There is mild thickening of the aortic valve. Aortic valve regurgitation is trivial. Aortic valve sclerosis is present, with no evidence of aortic valve stenosis.  Pulmonic Valve: The pulmonic valve was normal in structure. Pulmonic valve regurgitation is not visualized. No evidence of pulmonic stenosis.  Aorta: Aortic dilatation noted. There is mild dilatation of the ascending aorta, measuring 41 mm.  Venous: The inferior vena cava is normal in size with greater than 50% respiratory variability, suggesting right atrial pressure of 3 mmHg.  IAS/Shunts: No atrial level shunt detected by color flow Doppler.   LEFT VENTRICLE PLAX 2D LVIDd:         5.40 cm   Diastology LVIDs:         3.20 cm   LV e' medial:    8.38 cm/s LV PW:         1.20 cm   LV E/e' medial:  9.4 LV IVS:        1.20 cm   LV e' lateral:   12.30 cm/s LVOT diam:     2.40 cm   LV E/e' lateral: 6.4 LV SV:         107 LV SV Index:   45        2D Longitudinal Strain LVOT Area:     4.52 cm  2D Strain GLS (A2C):   -19.0 % 2D Strain GLS (A3C):   -21.9 % 2D Strain GLS (A4C):   -20.9 % 2D  Strain GLS Avg:     -20.6 %  3D Volume EF: 3D EF:  57 % LV EDV:       115 ml LV ESV:       49 ml LV SV:        66 ml  RIGHT VENTRICLE RV Basal diam:  4.90 cm RV Mid diam:    4.10 cm RV S prime:     17.80 cm/s TAPSE (M-mode): 2.8 cm  LEFT ATRIUM           Index        RIGHT ATRIUM           Index LA diam:      4.80 cm 2.01 cm/m   RA Pressure: 3.00 mmHg LA Vol (A2C): 60.4 ml 25.28 ml/m  RA Area:     22.80 cm RA Volume:   79.50 ml  33.27 ml/m AORTIC VALVE LVOT Vmax:   115.00 cm/s LVOT Vmean:  77.600 cm/s LVOT VTI:    0.237 m  AORTA Ao Root diam: 3.60 cm Ao Asc diam:  4.10 cm  MITRAL VALVE                TRICUSPID VALVE MV Area (PHT): cm          Estimated RAP:  3.00 mmHg MV Decel Time: 239 msec MV E velocity: 79.05 cm/s   SHUNTS MV A velocity: 114.50 cm/s  Systemic VTI:  0.24 m MV E/A ratio:  0.69         Systemic Diam: 2.40 cm  Charlton Haws MD Electronically signed by Charlton Haws MD Signature Date/Time: 04/06/2023/10:55:41 AM    Final             Risk Assessment/Calculations:         STOP-Bang Score:         Physical Exam:   VS:  BP (!) 118/52   Pulse 97   Ht 5\' 6"  (1.676 m)   Wt 285 lb (129.3 kg)   SpO2 95%   BMI 46.00 kg/m    Wt Readings from Last 3 Encounters:  07/23/23 285 lb (129.3 kg)  07/13/23 286 lb 4.8 oz (129.9 kg)  04/09/23 285 lb 4.8 oz (129.4 kg)    GEN: Well nourished, overweight,  well developed in no acute distress NECK: No JVD; No carotid bruits CARDIAC: RRR, no murmurs, rubs, gallops RESPIRATORY:  Clear to auscultation without rales, wheezing or rhonchi  ABDOMEN: Soft, non-tender, non-distended EXTREMITIES:  No edema; No deformity   ASSESSMENT AND PLAN: .    Exertional dyspnea / CAD s/p PCI / HLD -LHC 07/2018 previously placed DES-Cx patent, previously placed RPDA DES patent, RPDA lesion 40% stenosed just beyond stent, there was 85% ost LAD stenosis just prior to previously placed stent treated with scoring balloon  angioplasty and DES overlapping previously placed stent with 0%  residual stenosis. Recommended for indefinite DAPT.  1 year history of exertional dyspnea with more than usual activities such as walking with feed buckets to feed animals on his mom's 40 acre property.  This is unchanged. Myoview 04/07/23 intermediate risk study with inferior infarct with peri infarct ischemia. Prior myoview 09/2021 low risk study. GDMT Aspirin, Brilinta, Rosuvastatin, Imdur.  No BB due to previous bradycardia, 1st degree AV block.Since addition of Imdur, still persistent dyspnea with less than usual activity. No chest pain. Given evidence of peri-infarct ischemia on myoview and persistent dyspnea, plan for [facility].   LE edema - Echo 03/2023 normal LVEF 60 to 65% with gr1DD.  Anticipate venous insufficiency, at working on his feet in the  heat on his property contributory.  Continue present dose Lasix, spironolactone. Recommend leg elevation compression socks.   OSA-presently untreated.  Encouraged to consider CPAP.Not addressed at this clinic visit, readdress at follow up.    First-degree AV block-continue to monitor with periodic EKG.  Beta-blockers avoided due to prior bradycardia.   Morbid obesity/diabetes-follows with Dr. Elvera Lennox of endocrinology. Appreciate inclusion of Ozempic. Hold Ozempic Monday prior to [facility].    Ascending aorta dilation -41 mm by echo 03/2023.  Repeat echo in 1 year already ordered.  Continue optimal BP control.  Recommend avoidance of fluoroquinolones.   Hypertension- Continue Valsartan 160mg  daily, continue spironolactone 25 mg daily, furosemide 20 mg daily, Imdur 30mg  daily. Discussed to monitor BP at home at least 2 hours after medications and sitting for 5-10 minutes.     Informed Consent   Shared Decision Making/Informed Consent The risks [stroke (1 in 1000), death (1 in 1000), kidney failure [usually temporary] (1 in 500), bleeding (1 in 200), allergic reaction [possibly serious] (1 in 200)],  benefits (diagnostic support and management of coronary artery disease) and alternatives of a cardiac catheterization were discussed in detail with Mr. Pea and he is willing to proceed.     Dispo: follow up 2-3 weeks after LHC  Signed, Alver Sorrow, NP

## 2023-07-23 ENCOUNTER — Other Ambulatory Visit (HOSPITAL_COMMUNITY): Payer: Self-pay

## 2023-07-23 ENCOUNTER — Encounter (HOSPITAL_BASED_OUTPATIENT_CLINIC_OR_DEPARTMENT_OTHER): Payer: Self-pay | Admitting: Family

## 2023-07-23 ENCOUNTER — Ambulatory Visit: Payer: Medicare PPO | Admitting: Internal Medicine

## 2023-07-23 ENCOUNTER — Ambulatory Visit (HOSPITAL_BASED_OUTPATIENT_CLINIC_OR_DEPARTMENT_OTHER): Payer: Medicare PPO | Admitting: Family

## 2023-07-23 VITALS — BP 118/52 | HR 97 | Ht 66.0 in | Wt 285.0 lb

## 2023-07-23 DIAGNOSIS — I10 Essential (primary) hypertension: Secondary | ICD-10-CM

## 2023-07-23 DIAGNOSIS — E785 Hyperlipidemia, unspecified: Secondary | ICD-10-CM | POA: Diagnosis not present

## 2023-07-23 DIAGNOSIS — R0609 Other forms of dyspnea: Secondary | ICD-10-CM | POA: Diagnosis not present

## 2023-07-23 DIAGNOSIS — G4733 Obstructive sleep apnea (adult) (pediatric): Secondary | ICD-10-CM | POA: Diagnosis not present

## 2023-07-23 DIAGNOSIS — I440 Atrioventricular block, first degree: Secondary | ICD-10-CM | POA: Diagnosis not present

## 2023-07-23 DIAGNOSIS — I77810 Thoracic aortic ectasia: Secondary | ICD-10-CM | POA: Diagnosis not present

## 2023-07-23 DIAGNOSIS — I25118 Atherosclerotic heart disease of native coronary artery with other forms of angina pectoris: Secondary | ICD-10-CM

## 2023-07-23 LAB — CBC
Hematocrit: 42.2 % (ref 37.5–51.0)
Hemoglobin: 13.5 g/dL (ref 13.0–17.7)
MCH: 30.1 pg (ref 26.6–33.0)
MCHC: 32 g/dL (ref 31.5–35.7)
MCV: 94 fL (ref 79–97)
Platelets: 309 10*3/uL (ref 150–450)
RBC: 4.49 x10E6/uL (ref 4.14–5.80)
RDW: 12.4 % (ref 11.6–15.4)
WBC: 9.1 10*3/uL (ref 3.4–10.8)

## 2023-07-23 LAB — BASIC METABOLIC PANEL
BUN/Creatinine Ratio: 17 (ref 10–24)
BUN: 26 mg/dL (ref 8–27)
CO2: 22 mmol/L (ref 20–29)
Calcium: 10.1 mg/dL (ref 8.6–10.2)
Chloride: 103 mmol/L (ref 96–106)
Creatinine, Ser: 1.54 mg/dL — ABNORMAL HIGH (ref 0.76–1.27)
Glucose: 116 mg/dL — ABNORMAL HIGH (ref 70–99)
Potassium: 4.7 mmol/L (ref 3.5–5.2)
Sodium: 143 mmol/L (ref 134–144)
eGFR: 48 mL/min/{1.73_m2} — ABNORMAL LOW (ref >59)

## 2023-07-23 MED ORDER — NITROGLYCERIN 0.4 MG/SPRAY TL SOLN: 1.0000 | 12 refills | Status: DC | PRN

## 2023-07-23 NOTE — Patient Instructions (Signed)
Testing/Procedures:  [facility] & VASCULAR AT [facility] [address] [place] [address] Dept: [phone]  Erik Hancock  07/23/2023  You are scheduled for a Cardiac Catheterization on Wednesday, September 4 with Dr. Bryan Lemma.  1. Please arrive at the [facility] (Main Entrance A) at [facility]: [address] [place], [place] [address] at 12:30 PM (This time is 2 hour(s) before your procedure to ensure your preparation). Free valet parking service is available. You will check in at ADMITTING. The support person will be asked to wait in the waiting room.  It is OK to have someone drop you off and come back when you are ready to be discharged.    Special note: Every effort is made to have your procedure done on time. Please understand that emergencies sometimes delay scheduled procedures.  2. Diet: Do not eat solid foods after midnight.  The patient may have clear liquids until 5am upon the day of the procedure.  3. Labs: You will need to have blood drawn on Friday, August 30 at [facility]:  [address] [place], [place] [address] (lab is in the Primary Care office located on the 3rd floor).  Hours: 8:00 am - 4:30 pm (avoid 12:00 pm - 1:00 pm)  Phone: [phone].  Tell staff you are there for blood work and they will direct you to the lab.  You do not need an appointment. You do not need to be fasting.  4. Medication instructions in preparation for your procedure:   Contrast Allergy: No   Stop taking, Lasix (Furosemide)  and Spironolactone Wednesday, September 4,  Take only 15 units of insulin the night before your procedure. Do not take any insulin on the day of the procedure.  Take only 12 units of Tresiba the night before your procedure. Do not take any on the day of.  STOP taking Ozempic on Monday, September 2.  On the morning of your procedure,  take your Aspirin 81 mg and Brilinta/Ticagrelor and any morning medicines NOT listed above.  You may use sips of water.  5. Plan to go home the same day, you will only stay overnight if medically necessary. 6. Bring a current list of your medications and current insurance cards. 7. You MUST have a responsible person to drive you home. 8. Someone MUST be with you the first 24 hours after you arrive home or your discharge will be delayed. 9. Please wear clothes that are easy to get on and off and wear slip-on shoes.  Thank you for allowing [place] to care for you!   -- [facility]   Follow-Up: At [facility], you and your health needs are our priority.  As part of our continuing mission to provide you with exceptional heart care, we have created designated Provider Care Teams.  These Care Teams include your primary Cardiologist (physician) and Advanced Practice Providers (APPs -  Physician Assistants and Nurse Practitioners) who all work together to provide you with the care you need, when you need it.  We recommend signing up for the patient portal called "MyChart".  Sign up information is provided on this After Visit Summary.  MyChart is used to connect with patients for Virtual Visits (Telemedicine).  Patients are able to view lab/test results, encounter notes, upcoming appointments, etc.  Non-urgent messages can be sent to your provider as well.   To learn  more about what you can do with MyChart, go to [email or web address].    Your next appointment:   2-3 week(s)  Provider:   Chilton Si, MD or Gillian Shields, NP

## 2023-07-27 ENCOUNTER — Telehealth (HOSPITAL_BASED_OUTPATIENT_CLINIC_OR_DEPARTMENT_OTHER): Payer: Self-pay

## 2023-07-27 ENCOUNTER — Telehealth: Payer: Self-pay | Admitting: *Deleted

## 2023-07-27 MED ORDER — NITROGLYCERIN 0.4 MG/SPRAY TL SOLN: 1.0000 | 4 refills | Status: AC | PRN

## 2023-07-27 NOTE — Telephone Encounter (Signed)
Cardiac Catheterization scheduled at [facility] for: Wednesday July 28, 2023 2:30 PM Arrival time [facility] Main Entrance A at: 12 :30 PM  Nothing to eat after midnight prior to procedure, clear liquids until 5 AM day of procedure.  Medication instructions: -Hold:  Insulin/Tresiba-AM of procedure  1/2 usual Insulin HS prior to procedure  Spironolactone/Lasix/Valsartan-day before and day of procedure-per protocol GFR < 60-pt already taken today, will hold tomorrow AM  See 07/23/23 BMP results-pt knows to hold lasix for 2 days  Semaglutide-weekly on Mondays-did not take today and will hold until post procedure   -Other usual morning medications can be taken with sips of water including aspirin 81 mg and Brilinta 60 mg.  Plan to go home the same day, you will only stay overnight if medically necessary.  You must have responsible adult to drive you home.  Someone must be with you the first 24 hours after you arrive home.  Reviewed procedure instructions with patient's wife (DPR).

## 2023-07-27 NOTE — Telephone Encounter (Addendum)
Results called to patient who verbalizes understanding! Patient needing nitroglycerin refills, sent to pharmacy.   ----- Message from Alver Sorrow sent at 07/25/2023  8:03 PM EDT ----- CBC with no evidence of anemia nor infection.  Potassium has normalized. Kidney function decreased from previous. Recommend holding Furosemide for 2 days then return to daily dosing.

## 2023-07-27 NOTE — Addendum Note (Signed)
Addended by: Marlene Lard on: 07/27/2023 10:41 AM   Modules accepted: Orders

## 2023-07-28 ENCOUNTER — Ambulatory Visit (HOSPITAL_COMMUNITY)
Admission: RE | Admit: 2023-07-28 | Discharge: 2023-07-28 | Disposition: A | Discharge status: Home / Self Care | Payer: Medicare PPO | Attending: Cardiology | Admitting: Cardiology

## 2023-07-28 ENCOUNTER — Encounter (HOSPITAL_COMMUNITY)
Admission: RE | Disposition: A | Discharge status: Home / Self Care | Payer: Self-pay | Source: Home / Self Care | Attending: Cardiology

## 2023-07-28 ENCOUNTER — Emergency Department (HOSPITAL_COMMUNITY)
Admission: EM | Admit: 2023-07-28 | Discharge: 2023-07-28 | Disposition: A | Discharge status: Home / Self Care | Payer: Medicare PPO | Attending: Emergency Medicine | Admitting: Emergency Medicine

## 2023-07-28 ENCOUNTER — Encounter (HOSPITAL_COMMUNITY): Payer: Self-pay

## 2023-07-28 ENCOUNTER — Other Ambulatory Visit: Payer: Self-pay

## 2023-07-28 DIAGNOSIS — S8981XA Other specified injuries of right lower leg, initial encounter: Secondary | ICD-10-CM | POA: Insufficient documentation

## 2023-07-28 DIAGNOSIS — Z794 Long term (current) use of insulin: Secondary | ICD-10-CM | POA: Insufficient documentation

## 2023-07-28 DIAGNOSIS — I25118 Atherosclerotic heart disease of native coronary artery with other forms of angina pectoris: Secondary | ICD-10-CM

## 2023-07-28 DIAGNOSIS — N189 Chronic kidney disease, unspecified: Secondary | ICD-10-CM | POA: Insufficient documentation

## 2023-07-28 DIAGNOSIS — Z538 Procedure and treatment not carried out for other reasons: Secondary | ICD-10-CM | POA: Insufficient documentation

## 2023-07-28 DIAGNOSIS — S81811A Laceration without foreign body, right lower leg, initial encounter: Secondary | ICD-10-CM | POA: Insufficient documentation

## 2023-07-28 DIAGNOSIS — L03115 Cellulitis of right lower limb: Secondary | ICD-10-CM | POA: Diagnosis not present

## 2023-07-28 DIAGNOSIS — E1122 Type 2 diabetes mellitus with diabetic chronic kidney disease: Secondary | ICD-10-CM | POA: Diagnosis not present

## 2023-07-28 DIAGNOSIS — Y990 Civilian activity done for income or pay: Secondary | ICD-10-CM | POA: Insufficient documentation

## 2023-07-28 DIAGNOSIS — Z7982 Long term (current) use of aspirin: Secondary | ICD-10-CM | POA: Insufficient documentation

## 2023-07-28 DIAGNOSIS — W010XXA Fall on same level from slipping, tripping and stumbling without subsequent striking against object, initial encounter: Secondary | ICD-10-CM | POA: Insufficient documentation

## 2023-07-28 DIAGNOSIS — W228XXA Striking against or struck by other objects, initial encounter: Secondary | ICD-10-CM | POA: Diagnosis not present

## 2023-07-28 DIAGNOSIS — I129 Hypertensive chronic kidney disease with stage 1 through stage 4 chronic kidney disease, or unspecified chronic kidney disease: Secondary | ICD-10-CM | POA: Insufficient documentation

## 2023-07-28 DIAGNOSIS — R0609 Other forms of dyspnea: Secondary | ICD-10-CM

## 2023-07-28 LAB — CBC
HCT: 37.6 % — ABNORMAL LOW (ref 39.0–52.0)
Hemoglobin: 12.5 g/dL — ABNORMAL LOW (ref 13.0–17.0)
MCH: 30 pg (ref 26.0–34.0)
MCHC: 33.2 g/dL (ref 30.0–36.0)
MCV: 90.2 fL (ref 80.0–100.0)
Platelets: 270 10*3/uL (ref 150–400)
RBC: 4.17 MIL/uL — ABNORMAL LOW (ref 4.22–5.81)
RDW: 13.1 % (ref 11.5–15.5)
WBC: 7.4 10*3/uL (ref 4.0–10.5)
nRBC: 0 % (ref 0.0–0.2)

## 2023-07-28 LAB — POCT I-STAT, CHEM 8
BUN: 16 mg/dL (ref 8–23)
Calcium, Ion: 1.2 mmol/L (ref 1.15–1.40)
Chloride: 104 mmol/L (ref 98–111)
Creatinine, Ser: 1.1 mg/dL (ref 0.61–1.24)
Glucose, Bld: 207 mg/dL — ABNORMAL HIGH (ref 70–99)
HCT: 36 % — ABNORMAL LOW (ref 39.0–52.0)
Hemoglobin: 12.2 g/dL — ABNORMAL LOW (ref 13.0–17.0)
Potassium: 4 mmol/L (ref 3.5–5.1)
Sodium: 139 mmol/L (ref 135–145)
TCO2: 23 mmol/L (ref 22–32)

## 2023-07-28 LAB — CBG MONITORING, ED: Glucose-Capillary: 181 mg/dL — ABNORMAL HIGH (ref 70–99)

## 2023-07-28 LAB — GLUCOSE, CAPILLARY: Glucose-Capillary: 199 mg/dL — ABNORMAL HIGH (ref 70–99)

## 2023-07-28 SURGERY — RIGHT/LEFT HEART CATH AND CORONARY ANGIOGRAPHY
Anesthesia: LOCAL

## 2023-07-28 MED ORDER — SODIUM CHLORIDE 0.9 % WEIGHT BASED INFUSION
3.0000 mL/kg/h | INTRAVENOUS | Status: AC
  Administered 2023-07-28: 3 mL/kg/h via INTRAVENOUS

## 2023-07-28 MED ORDER — SODIUM CHLORIDE 0.9 % WEIGHT BASED INFUSION: 1.0000 mL/kg/h | INTRAVENOUS | Status: DC

## 2023-07-28 MED ORDER — CEPHALEXIN 250 MG PO CAPS
1000.0000 mg | ORAL_CAPSULE | Freq: Once | ORAL | Status: AC
  Administered 2023-07-28: 1000 mg via ORAL
  Filled 2023-07-28: qty 4

## 2023-07-28 MED ORDER — CEPHALEXIN 500 MG PO CAPS
500.0000 mg | ORAL_CAPSULE | Freq: Four times a day (QID) | ORAL | 0 refills | Status: AC
Start: 2023-07-28 — End: 2023-08-03

## 2023-07-28 MED ORDER — ASPIRIN 81 MG PO CHEW: 81.0000 mg | CHEWABLE_TABLET | ORAL | Status: DC

## 2023-07-28 NOTE — Progress Notes (Addendum)
Called to bedside to evaluate right lower extremity wound. This apparently occurred on 07/16/23 as he tripped over a tractor motor. He has not sought care for this wound. He now reports pain in his right hip that is radiating to his right front thigh. This pain is at rest and improves with with walking.  He denies fever and chills.  On exam, large laceration on right lower leg, with swelling and surrounding erythema, no oozing. Appears consistent with cellulitis. Intact right pedal pulse.  With further questioning, I think his right hip pain that radiates to his right thigh sounds more consistent with sciatic type nerve pain and per the patient has occurred in the past - prior to injury.   Will obtain CBC for evaluation of leukocytosis. I think it would be best to cancel this outpatient heart cath today and treat his cellulitis, especially in the setting of insulin dependent DM. If a leukocytosis, will refer to the ER. If no leukocytosis, then will refer back to PCP for antibiotic selection given history of sepsis.   ADDENDUM: WBC 7.4  I was able to speak with Dr. Caren Macadam office and made him an appt for tomorrow at 2:30 with [facility] Lo[facility] - [facility] to help with ABX selection given his history. However, after speaking with the patient and his wife, we agree that ER evaluation would be best given his hip pain with unclear etiology.   Would prefer to re-evaluate him in our OP cardiology office prior to rescheduling heart cath.       Marcelino Duster, PA-C 07/28/2023, 3:11 PM [phone] [facility] [address] [place], [place] [address]

## 2023-07-28 NOTE — Progress Notes (Signed)
Left in/ pt to ER

## 2023-07-28 NOTE — Discharge Instructions (Signed)
Thank you for allowing me to be part of your care today.  You were evaluated in the ED for a wound and possible infection on your right lower leg.  It appears that you have cellulitis.  You were given your first dose of antibiotic while in the ED.  Please pick up the remaining prescription at the pharmacy tomorrow and begin taking as soon as possible.  Continue to monitor this infection for any worsening signs.  Should you develop worsening of the redness, swelling, or have drainage from the wound, please seek medical attention immediately.

## 2023-07-28 NOTE — H&P (View-Only) (Signed)
Called to bedside to evaluate right lower extremity wound. This apparently occurred on 07/16/23 as he tripped over a tractor motor. He has not sought care for this wound. He now reports pain in his right hip that is radiating to his right front thigh. This pain is at rest and improves with with walking.  He denies fever and chills.  On exam, large laceration on right lower leg, with swelling and surrounding erythema, no oozing. Appears consistent with cellulitis. Intact right pedal pulse.  With further questioning, I think his right hip pain that radiates to his right thigh sounds more consistent with sciatic type nerve pain and per the patient has occurred in the past - prior to injury.   Will obtain CBC for evaluation of leukocytosis. I think it would be best to cancel this outpatient heart cath today and treat his cellulitis, especially in the setting of insulin dependent DM. If a leukocytosis, will refer to the ER. If no leukocytosis, then will refer back to PCP for antibiotic selection given history of sepsis.   ADDENDUM: WBC 7.4  I was able to speak with Dr. Caren Macadam office and made him an appt for tomorrow at 2:30 with [facility]ice Lofts - [facility] to help with ABX selection given his history. However, after speaking with the patient and his wife, we agree that ER evaluation would be best given his hip pain with unclear etiology.   Would prefer to re-evaluate him in our OP cardiology office prior to rescheduling heart cath.       Marcelino Duster, PA-C 07/28/2023, 3:11 PM [phone] [facility] [address] [place], [place] [address]

## 2023-07-28 NOTE — ED Provider Notes (Signed)
[place] EMERGENCY DEPARTMENT AT [facility] Provider Note   CSN: [id number] Arrival date & time: 07/28/23  1517     History  Chief Complaint  Patient presents with   Leg Injury    Erik Hancock is a 72 y.o. male with past medical history significant for hypertension, hyperlipidemia, GERD, diabetes, CKD presents to the ED for concerns about a right lower leg injury.  Patient was at the hospital for a heart cath earlier today, but they were not comfortable performing a heart cath due to the wound on his leg.  Patient states the wound occurred while he was working in his barn and tripped over an Optometrist several days ago.  Patient's wife at bedside states that it actually looks better than it originally did.  Patient reports that he has minimal pain in the leg and it is just "sore".  Denies drainage, fever, significant swelling in the leg.         Home Medications Prior to Admission medications   Medication Sig Start Date End Date Taking? Authorizing Provider  acetaminophen (TYLENOL) 500 MG tablet Take 2 tablets (1,000 mg total) by mouth every 6 (six) hours as needed for mild pain or moderate pain. 06/03/22   Jenne Pane, PA-C  aspirin 81 MG tablet Take 81 mg by mouth daily.    [provider]  furosemide (LASIX) 20 MG tablet Take 1 tablet (20 mg total) by mouth daily. 07/08/23   Chilton Si, MD  Insulin Aspart FlexPen (NOVOLOG) 100 UNIT/ML SMARTSIG:30 Unit(s) SUB-Q Twice Daily 02/05/23   [provider]  Insulin Pen Needle 32G X 4 MM MISC Use 4x a day 06/19/22   Carlus Pavlov, MD  isosorbide mononitrate (IMDUR) 30 MG 24 hr tablet Take 1 tablet (30 mg total) by mouth daily. 04/09/23 07/08/23  Alver Sorrow, NP  nitroGLYCERIN (NITROLINGUAL) 0.4 MG/SPRAY spray Place 1 spray under the tongue every 5 (five) minutes x 3 doses as needed for chest pain. 07/27/23   Alver Sorrow, NP  pantoprazole (PROTONIX) 40 MG tablet Take 1 tablet by mouth once  daily 05/18/23   Donita Brooks, MD  rosuvastatin (CRESTOR) 10 MG tablet Take 1 tablet (10 mg total) by mouth daily. 04/09/23   Alver Sorrow, NP  Semaglutide, 2 MG/DOSE, 8 MG/3ML SOPN Inject 2 mg as directed once a week. 07/13/23   Donita Brooks, MD  spironolactone (ALDACTONE) 25 MG tablet Take 1 tablet (25 mg total) by mouth daily. 07/08/23 07/02/24  Chilton Si, MD  ticagrelor (BRILINTA) 60 MG TABS tablet Take 1 tablet (60 mg total) by mouth 2 (two) times daily. 04/09/23   Alver Sorrow, NP  TRESIBA FLEXTOUCH 200 UNIT/ML FlexTouch Pen Inject 24 Units into the skin daily. 11/10/22   Carlus Pavlov, MD  valsartan (DIOVAN) 160 MG tablet Take 1 tablet (160 mg total) by mouth daily. 04/23/23   Alver Sorrow, NP      Allergies    Beta adrenergic blockers    Review of Systems   Review of Systems  Constitutional:  Negative for fever.  Cardiovascular:  Negative for leg swelling.  Skin:  Positive for wound.    Physical Exam Updated Vital Signs BP 134/69   Pulse 63   Temp 98 F (36.7 C) (Oral)   Resp 17   SpO2 95%  Physical Exam Vitals and nursing note reviewed.  Constitutional:      General: He is not in acute distress.  Appearance: Normal appearance. He is not ill-appearing or diaphoretic.  Cardiovascular:     Rate and Rhythm: Normal rate and regular rhythm.  Pulmonary:     Effort: Pulmonary effort is normal.  Musculoskeletal:     Right lower leg: Laceration and tenderness present.       Legs:  Skin:    General: Skin is warm and dry.     Capillary Refill: Capillary refill takes less than 2 seconds.  Neurological:     Mental Status: He is alert. Mental status is at baseline.  Psychiatric:        Mood and Affect: Mood normal.        Behavior: Behavior normal.     ED Results / Procedures / Treatments   Labs (all labs ordered are listed, but only abnormal results are displayed) Labs Reviewed  CBG MONITORING, ED - Abnormal; Notable for the following  components:      Result Value   Glucose-Capillary 181 (*)    All other components within normal limits    EKG None  Radiology No results found.  Procedures Procedures    Medications Ordered in ED Medications - No data to display  ED Course/ Medical Decision Making/ A&P                                 Medical Decision Making  This patient presents to the ED with chief complaint(s) of wound to right lower leg with pertinent past medical history of diabetes.  The complaint involves an extensive differential diagnosis and also carries with it a high risk of complications and morbidity.    The differential diagnosis includes cellulitis, delayed wound healing   Records reviewed: Patient had labs obtained upstairs prior to heart cath procedure.  CBC without leukocytosis.  Metabolic panel without electrolyte derangement.  Initial Assessment:   Exam significant for an approximately 5 inch long laceration with surrounding erythema and induration to the right lower anterior leg.  Wound has a large scab.  There is minimal tenderness to this area.  No significant right lower extremity swelling.  Increased warmth over the area of induration.  No bleeding or drainage.  Leg is neurovascularly intact.  Treatment and Reassessment: Patient received first dose of cephalexin while in the ED  Disposition:   Will send patient home on a 6-day course of cephalexin to treat cellulitis.  Discussed monitoring wound for worsening signs of infection.  The patient has been appropriately medically screened and/or stabilized in the ED. I have low suspicion for any other emergent medical condition which would require further screening, evaluation or treatment in the ED or require inpatient management. At time of discharge the patient is hemodynamically stable and in no acute distress. I have discussed work-up results and diagnosis with patient and answered all questions. Patient is agreeable with discharge  plan. We discussed strict return precautions for returning to the emergency department and they verbalized understanding.             Final Clinical Impression(s) / ED Diagnoses Final diagnoses:  None    Rx / DC Orders ED Discharge Orders     None         Lenard Simmer, PA-C 07/28/23 1842    Rondel Baton, MD 07/31/23 1057

## 2023-07-28 NOTE — ED Triage Notes (Signed)
Pt was here for a heart cath earlier today, but they were unwilling to do they heart cath because of a wound on his right lower leg that they were concerned with. The wound occurred while he was working in his barn and tripped over and Optometrist. There is a 4 to 5 inch area on concern that could be a skin avulsion or simple laceration but it is currently healed. The area around the laceration is red and puffy but no noticeable drainage at this time. Pt is otherwise stable and has minimal pain in the leg. Supposedly the medical tem upstairs ran labs on him.

## 2023-07-29 ENCOUNTER — Ambulatory Visit: Payer: Medicare PPO | Admitting: Family Medicine

## 2023-07-29 VITALS — BP 140/82 | HR 82 | Temp 97.9°F | Ht 66.0 in | Wt 286.0 lb

## 2023-07-29 DIAGNOSIS — R1031 Right lower quadrant pain: Secondary | ICD-10-CM | POA: Diagnosis not present

## 2023-07-29 DIAGNOSIS — M545 Low back pain, unspecified: Secondary | ICD-10-CM | POA: Diagnosis not present

## 2023-07-29 DIAGNOSIS — R109 Unspecified abdominal pain: Secondary | ICD-10-CM | POA: Diagnosis not present

## 2023-07-29 LAB — URINALYSIS, ROUTINE W REFLEX MICROSCOPIC
Bilirubin Urine: NEGATIVE
Hgb urine dipstick: NEGATIVE
Ketones, ur: NEGATIVE
Leukocytes,Ua: NEGATIVE
Nitrite: NEGATIVE
Protein, ur: NEGATIVE
Specific Gravity, Urine: 1.025 (ref 1.001–1.035)
pH: 5.5 (ref 5.0–8.0)

## 2023-07-29 MED ORDER — OXYCODONE-ACETAMINOPHEN 5-325 MG PO TABS: 1.0000 | ORAL_TABLET | Freq: Four times a day (QID) | ORAL | 0 refills | Status: AC | PRN

## 2023-07-29 MED ORDER — TAMSULOSIN HCL 0.4 MG PO CAPS: 0.4000 mg | ORAL_CAPSULE | Freq: Every day | ORAL | 0 refills | Status: AC

## 2023-07-29 NOTE — Progress Notes (Signed)
Subjective:    Patient ID: Erik Hancock, male    DOB: 01/12/1951, 72 y.o.   MRN: [id number]  Patient is currently on Keflex for cellulitis in his right lower extremity.  He denies any fevers or chills.  He started the antibiotics yesterday.  This morning he woke up with severe pain in his right flank.  The pain is radiating into his right testicle.  The pain comes and goes in waves.  Urinalysis shows no blood, no nitrates, no leukocyte esterase.  He denies any dysuria or urgency or frequency.  He has had several kidney stones in the past and he states the pain feels similar to his previous kidney stones. Past Medical History:  Diagnosis Date  . Arthritis    "all over" (07/29/2018)  . CAD S/P percutaneous coronary angioplasty    a. stent to mLAD 11/2001. b. DES to PDA/mLCx 2007. c. DES to ostial LAD 07/2018.  [name] Chronic lower back pain   . CKD (chronic kidney disease), stage III (HCC)   . CKD (chronic kidney disease), stage III (HCC)   . Diabetic retinopathy (HCC)   . Diabetic retinopathy (HCC)   . GERD (gastroesophageal reflux disease)   . High cholesterol   . History of kidney stones   . Hypertension   . Mobitz type 2 second degree atrioventricular block    a. noted during 07/2018 adm, metoprolol discontinued.  . Morbid obesity (HCC) 07/28/2018  . Prostatitis   . Pulmonary nodule 2013   CT  . Sleep apnea    stopbang=5  . Suspected sleep apnea   . Type II diabetes mellitus (HCC)    Past Surgical History:  Procedure Laterality Date  . CARDIAC CATHETERIZATION  JUNE 2003   PATENT LAD STENT/ BODERLINE OBSTRUCTIVE DISEASE POSTERIOR DESCENDING ARTERY/ NORMAL LVF  . CATARACT EXTRACTION W/ INTRAOCULAR LENS  IMPLANT, BILATERAL Bilateral 2017  . CERVICAL SCOVILLE FORAMINOTOMY W/ EXCISION OF HERNIATED NUCLEC PULPOSUS  2009   C6 - 7  . CORONARY ANGIOPLASTY WITH STENT PLACEMENT  03/30/2006   DR EDMUNDS - 90% LCx@OM2  TAXUS  DES 3.0 X 20 -> 3.25 mm,   95% RPDA -- TAXUS DES 3.0 X 16 --> 3.5 mm  .  CORONARY ANGIOPLASTY WITH STENT PLACEMENT  11/2001   (Dr. Ty Hilts for Dr. Mayford Knife) - mLAD@D2  - TAXUS EXPRESS DES 3.5 x 15   . CORONARY ANGIOPLASTY WITH STENT PLACEMENT  07/29/2018  . CORONARY STENT INTERVENTION N/A 07/29/2018   Procedure: CORONARY STENT INTERVENTION;  Surgeon: Marykay Lex, MD;  Location: [facility] INVASIVE CV LAB;  Service: Cardiovascular;  Laterality: N/A;  . CYSTOSCOPY W/ RETROGRADES  05/04/2012   Procedure: CYSTOSCOPY WITH RETROGRADE PYELOGRAM;  Surgeon: Milford Cage, MD;  Location: [facility] ORS;  Service: Urology;  Laterality: Left;  . CYSTOSCOPY W/ URETERAL STENT PLACEMENT  05/04/2012   Procedure: CYSTOSCOPY WITH STENT REPLACEMENT;  Surgeon: Milford Cage, MD;  Location: [facility];  Service: Urology;  Laterality: Left;  . CYSTOSCOPY WITH STENT PLACEMENT Left 04/04/2012  . CYSTOSCOPY WITH URETEROSCOPY  05/04/2012   Procedure: CYSTOSCOPY WITH URETEROSCOPY;  Surgeon: Milford Cage, MD;  Location: [facility];  Service: Urology;  Laterality: Left;      . I & D EXTREMITY Right 02/11/2022   Procedure: IRRIGATION AND DEBRIDEMENT  OF HAND AND FOREARM;  Surgeon: Bradly Bienenstock, MD;  Location: [facility] OR;  Service: Orthopedics;  Laterality: Right;  . LEFT HEART CATH AND CORONARY ANGIOGRAPHY N/A 07/29/2018   Procedure: LEFT HEART CATH AND CORONARY  ANGIOGRAPHY;  Surgeon: Marykay Lex, MD;  Location: [facility] INVASIVE CV LAB;  Service: Cardiovascular;  Laterality: N/A;  . LEFT URETEROSCOPIC STONE EXTRACTION  03-14-2003   X2  . PARTIAL KNEE ARTHROPLASTY Left 06/02/2022   Procedure: UNICOMPARTMENTAL KNEE;  Surgeon: Sheral Apley, MD;  Location: [facility];  Service: Orthopedics;  Laterality: Left;   Current Outpatient Medications on File Prior to Visit  Medication Sig Dispense Refill  . acetaminophen (TYLENOL) 500 MG tablet Take 2 tablets (1,000 mg total) by mouth every 6 (six) hours as needed for mild pain or moderate pain. 60 tablet 0  . aspirin 81 MG tablet Take 81 mg by mouth daily.    .  cephALEXin (KEFLEX) 500 MG capsule Take 1 capsule (500 mg total) by mouth 4 (four) times daily for 6 days. 24 capsule 0  . furosemide (LASIX) 20 MG tablet Take 1 tablet (20 mg total) by mouth daily. 90 tablet 2  . Insulin Aspart FlexPen (NOVOLOG) 100 UNIT/ML SMARTSIG:30 Unit(s) SUB-Q Twice Daily    . Insulin Pen Needle 32G X 4 MM MISC Use 4x a day 300 each 3  . nitroGLYCERIN (NITROLINGUAL) 0.4 MG/SPRAY spray Place 1 spray under the tongue every 5 (five) minutes x 3 doses as needed for chest pain. 25 g 4  . pantoprazole (PROTONIX) 40 MG tablet Take 1 tablet by mouth once daily 90 tablet 0  . rosuvastatin (CRESTOR) 10 MG tablet Take 1 tablet (10 mg total) by mouth daily. 90 tablet 3  . Semaglutide, 2 MG/DOSE, 8 MG/3ML SOPN Inject 2 mg as directed once a week. 9 mL 3  . spironolactone (ALDACTONE) 25 MG tablet Take 1 tablet (25 mg total) by mouth daily. 90 tablet 2  . ticagrelor (BRILINTA) 60 MG TABS tablet Take 1 tablet (60 mg total) by mouth 2 (two) times daily. 180 tablet 1  . TRESIBA FLEXTOUCH 200 UNIT/ML FlexTouch Pen Inject 24 Units into the skin daily. 9 mL 1  . valsartan (DIOVAN) 160 MG tablet Take 1 tablet (160 mg total) by mouth daily. 90 tablet 3  . isosorbide mononitrate (IMDUR) 30 MG 24 hr tablet Take 1 tablet (30 mg total) by mouth daily. 90 tablet 3   No current facility-administered medications on file prior to visit.   Allergies  Allergen Reactions  . Beta Adrenergic Blockers     Metoprolol stopped 07/2018 due to 2nd degree type 2 AV block.   Social History   Socioeconomic History  . Marital status: Married    Spouse name: Not on file  . Number of children: Not on file  . Years of education: Not on file  . Highest education level: Not on file  Occupational History  . Not on file  Tobacco Use  . Smoking status: Never  . Smokeless tobacco: Never  Vaping Use  . Vaping status: Never Used  Substance and Sexual Activity  . Alcohol use: Never  . Drug use: Never  . Sexual  activity: Not Currently  Other Topics Concern  . Not on file  Social History Narrative   Right handed    Lives with wife    Social Determinants of Health   Financial Resource Strain: Low Risk  (12/08/2022)   Overall Financial Resource Strain (CARDIA)   . Difficulty of Paying Living Expenses: Not hard at all  Food Insecurity: No Food Insecurity (12/08/2022)   Hunger Vital Sign   . Worried About Programme researcher, broadcasting/film/video in the Last Year: Never true   .  Ran Out of Food in the Last Year: Never true  Transportation Needs: No Transportation Needs (12/08/2022)   PRAPARE - Transportation   . Lack of Transportation (Medical): No   . Lack of Transportation (Non-Medical): No  Physical Activity: Inactive (12/08/2022)   Exercise Vital Sign   . Days of Exercise per Week: 0 days   . Minutes of Exercise per Session: 0 min  Stress: No Stress Concern Present (12/08/2022)   [employer] of Occupational Health - Occupational Stress Questionnaire   . Feeling of Stress : Not at all  Social Connections: Moderately Integrated (12/08/2022)   Social Connection and Isolation Panel [NHANES]   . Frequency of Communication with Friends and Family: More than three times a week   . Frequency of Social Gatherings with Friends and Family: Three times a week   . Attends Religious Services: 1 to 4 times per year   . Active Member of Clubs or Organizations: No   . Attends Banker Meetings: Never   . Marital Status: Married  Catering manager Violence: Not At Risk (12/08/2022)   Humiliation, Afraid, Rape, and Kick questionnaire   . Fear of Current or Ex-Partner: No   . Emotionally Abused: No   . Physically Abused: No   . Sexually Abused: No      Review of Systems  All other systems reviewed and are negative.      Objective:   Physical Exam Vitals reviewed.  Constitutional:      General: He is not in acute distress.    Appearance: He is well-developed. He is obese. He is not ill-appearing,  toxic-appearing or diaphoretic.  HENT:     Head: Normocephalic and atraumatic.  Neck:     Thyroid: No thyromegaly.     Vascular: No JVD.  Cardiovascular:     Rate and Rhythm: Normal rate and regular rhythm.     Heart sounds: Normal heart sounds. No murmur heard. Pulmonary:     Effort: Pulmonary effort is normal. No respiratory distress.     Breath sounds: Normal breath sounds. No wheezing or rales.  Chest:     Chest wall: No tenderness.  Abdominal:     General: Bowel sounds are normal. There is no distension.     Palpations: Abdomen is soft. There is no mass.     Tenderness: There is no abdominal tenderness. There is no guarding or rebound.  Musculoskeletal:     Cervical back: Neck supple.  Lymphadenopathy:     Cervical: No cervical adenopathy.  Skin:    Findings: No erythema or rash.  Neurological:     General: No focal deficit present.     Mental Status: He is alert and oriented to person, place, and time. Mental status is at baseline.     Cranial Nerves: No cranial nerve deficit.     Sensory: No sensory deficit.     Motor: No weakness.     Gait: Gait normal.     Deep Tendon Reflexes: Reflexes normal.  Psychiatric:        Mood and Affect: Mood normal.        Behavior: Behavior normal.        Thought Content: Thought content normal.        Judgment: Judgment normal.      Assessment & Plan:   Flank pain - Plan: Urinalysis, Routine w reflex microscopic I believe the patient has kidney stone.  Begin Flomax 0.4 mg p.o. daily to facilitate passage.  Use Percocet  1 to 2 tablets every 8 hours as needed for pain and push fluids.  Schedule CT scan to confirm presence of kidney stone and to determine size

## 2023-07-30 ENCOUNTER — Telehealth (HOSPITAL_BASED_OUTPATIENT_CLINIC_OR_DEPARTMENT_OTHER): Payer: Self-pay | Admitting: Cardiovascular Disease

## 2023-07-30 ENCOUNTER — Encounter (HOSPITAL_BASED_OUTPATIENT_CLINIC_OR_DEPARTMENT_OTHER): Payer: Self-pay | Admitting: *Deleted

## 2023-07-30 DIAGNOSIS — Z01812 Encounter for preprocedural laboratory examination: Secondary | ICD-10-CM

## 2023-07-30 DIAGNOSIS — I10 Essential (primary) hypertension: Secondary | ICD-10-CM

## 2023-07-30 NOTE — Telephone Encounter (Signed)
Advised wife, verbalized understanding.  

## 2023-07-30 NOTE — Telephone Encounter (Signed)
  Wife is calling regarding the cath that her husband was supposed to have done this past week. She is asking if that procedure will be rescheduled. Please advise.

## 2023-07-30 NOTE — Telephone Encounter (Addendum)
Was scheduled for Cardiac cath but was cancelled secondary to infection in his leg   Saw PCP yesterday and he is ok with patient having cath  Now has kidney stone   Rescheduled cath for 9/18 arrive at 6:30 for 8:30 with Dr Lynnette Caffey   Instruction letter and lab orders mailed to patient   Left message to call back

## 2023-08-04 ENCOUNTER — Telehealth (HOSPITAL_BASED_OUTPATIENT_CLINIC_OR_DEPARTMENT_OTHER): Payer: Self-pay | Admitting: *Deleted

## 2023-08-04 DIAGNOSIS — Z01812 Encounter for preprocedural laboratory examination: Secondary | ICD-10-CM

## 2023-08-04 NOTE — Telephone Encounter (Signed)
Spoke with wife and scheduled nurse visit for 12:45 on Friday

## 2023-08-04 NOTE — Telephone Encounter (Signed)
-----   Message from Alver Sorrow sent at 08/04/2023  1:56 PM EDT ----- Regarding: RE: [facility] 07/28/23 cancelled He was started on 6 day course of abx 07/28/23 for cellulitis. He probable needs at least a quick re-evaluation prior to proceeding with LHC so he does not get cancelled again. Juliette Alcide - this could be scheduled as nurse visit and I'll just pop in or as office visit if I have availability.  Alver Sorrow, NP ----- Message ----- From: Jacqlyn Krauss, RN Sent: 08/02/2023   9:06 AM EDT To: Jacqlyn Krauss, RN; Burnell Blanks, LPN; # Subject: RE: [facility] 07/28/23 cancelled                     [place],  Cath has been rescheduled to 08/11/23-see 07/30/23 phone note.  Copied from  07/30/23 Phone Note: "Saw PCP yesterday and he is ok with patient having cath  Now has kidney stone "  Do you want to see him before proceeding with cath?  Thanks, Programmer, applications ----- Message ----- From: Alver Sorrow, NP Sent: 07/29/2023   4:21 PM EDT To: Jacqlyn Krauss, RN; Marlene Lard, RN Subject: RE: [facility] 07/28/23 cancelled                     I think that is probably the best plan! That way we can be sure wound is healing. ----- Message ----- From: Jacqlyn Krauss, RN Sent: 07/29/2023   1:29 PM EDT To: Jacqlyn Krauss, RN; Alver Sorrow, NP; # Subject: [facility] 07/28/23 cancelled                         [place],  I don't know if you were aware, but he did not have cath yesterday because of wound on his leg. He was sent to ED from cath lab yesterday. Also, he saw Dr Tanya Nones today and Dr Caren Macadam note indicates he might have kidney stone.  He has a follow up scheduled with you 09/10/23. Would you want to see him back in the office before rescheduling cath?  Thanks,  Thurston Hole

## 2023-08-05 ENCOUNTER — Ambulatory Visit: Payer: Medicare PPO | Admitting: Neurology

## 2023-08-05 DIAGNOSIS — E1142 Type 2 diabetes mellitus with diabetic polyneuropathy: Secondary | ICD-10-CM

## 2023-08-05 DIAGNOSIS — G5603 Carpal tunnel syndrome, bilateral upper limbs: Secondary | ICD-10-CM

## 2023-08-05 DIAGNOSIS — R202 Paresthesia of skin: Secondary | ICD-10-CM

## 2023-08-05 NOTE — Procedures (Signed)
[facility]  [address] [place], [address]  [place], [place] [address] Tel: [phone] Fax: [phone] Test Date:  08/05/2023  Patient: Erik Hancock DOB: 1951-03-03 Physician: Nita Sickle, DO  Sex: Male Height: 5\' 6"  Ref Phys: Lynnea Ferrier, MD  ID#: [id number]   Technician:    History: This is a 72 year old man referred for evaluation of bilateral hand paresthesias and pain.  NCV & EMG Findings: Extensive electrodiagnostic testing of the right upper extremity and additional studies of the left shows:  Bilateral median sensory responses show prolonged latency (R5.4, L3.9 ms) and reduced amplitude (R4.5, L8.1V).  Bilateral ulnar sensory responses are absent.  Bilateral radial sensory responses show reduced amplitude (R9.1, L4.7 V).   Bilateral median motor responses show prolonged latency (R4.7, L4.1 ms).  Right ulnar motor responses within normal limits.  Left ulnar motor response shows mildly reduced amplitude (L6.5 mV). Chronic motor axon loss changes are seen affecting the distal hand muscles bilaterally, without accompanying active denervation.    Impression: The electrophysiologic findings are consistent with a chronic sensorimotor axonal polyneuropathy affecting the upper extremities, moderate. A superimposed bilateral median neuropathy at the wrist, consistent with a clinical diagnosis of carpal tunnel syndrome is also likely, which is moderate-to-severe in degree electrically.    ___________________________ Nita Sickle, DO    Nerve Conduction Studies   Stim Site NR Peak (ms) Norm Peak (ms) O-P Amp (V) Norm O-P Amp  Left Median Anti Sensory (2nd Digit)  32 C  Wrist    *3.9 <3.8 *8.1 >10  Right Median Anti Sensory (2nd Digit)  Wrist    *5.4 <3.8 *4.5 >10  Left Radial Anti Sensory (Base 1st Digit)  32 C  Wrist    2.8 <2.8 *9.1 >10  Right Radial Anti Sensory (Base 1st Digit)  32 C  Wrist    2.1 <2.8 *4.7 >10  Left Ulnar Anti Sensory (5th Digit)  32  C  Wrist *NR  <3.2  >5  Right Ulnar Anti Sensory (5th Digit)  32 C  Wrist *NR  <3.2  >5     Stim Site NR Onset (ms) Norm Onset (ms) O-P Amp (mV) Norm O-P Amp Site1 Site2 Delta-0 (ms) Dist (cm) Vel (m/s) Norm Vel (m/s)  Left Median Motor (Abd Poll Brev)  32 C  Wrist    *4.1 <4.0 5.5 >5 Elbow Wrist 5.4 28.0 52 >50  Elbow    9.5  5.1         Right Median Motor (Abd Poll Brev)  32 C  Wrist    *4.7 <4.0 5.5 >5 Elbow Wrist 5.9 30.0 51 >50  Elbow    10.6  4.9         Left Ulnar Motor (Abd Dig Minimi)  32 C  Wrist    3.0 <3.1 *6.5 >7 B Elbow Wrist 4.2 24.0 57 >50  B Elbow    7.2  5.5  A Elbow B Elbow 1.6 8.0 50 >50  A Elbow    8.8  5.4         Right Ulnar Motor (Abd Dig Minimi)  32 C  Wrist    3.1 <3.1 7.3 >7 B Elbow Wrist 4.5 22.0 50 >50  B Elbow    7.6  7.3  A Elbow B Elbow 2.0 10.0 50 >50  A Elbow    9.6  7.3          Electromyography   Side Muscle Ins.Act Fibs Fasc Recrt Amp Dur Poly Activation Comment  Right 1stDorInt Nml Nml Nml *1- *1+ *1+ *1+ Nml N/A  Right Abd Poll Brev Nml Nml Nml *2- *1+ *1+ *1+ Nml N/A  Right PronatorTeres Nml Nml Nml Nml Nml Nml Nml Nml N/A  Right Biceps Nml Nml Nml Nml Nml Nml Nml Nml N/A  Right Triceps Nml Nml Nml Nml Nml Nml Nml Nml N/A  Right Deltoid Nml Nml Nml Nml Nml Nml Nml Nml N/A  Right Ext Indicis Nml Nml Nml *1- *1+ *1+ *1+ Nml N/A  Left 1stDorInt Nml Nml Nml *2- *1+ *1+ *1+ Nml N/A  Left Abd Poll Brev Nml Nml Nml *1- *1+ *1+ *1+ Nml N/A  Left PronatorTeres Nml Nml Nml Nml Nml Nml Nml Nml N/A  Left Biceps Nml Nml Nml Nml Nml Nml Nml Nml N/A  Left Triceps Nml Nml Nml Nml Nml Nml Nml Nml N/A  Left Deltoid Nml Nml Nml Nml Nml Nml Nml Nml N/A  Left Ext Indicis Nml Nml Nml *1- *1+ *1+ *1+ Nml N/A      Waveforms:

## 2023-08-05 NOTE — Telephone Encounter (Signed)
Per Katina Dung patient will need new Bmp & CBC. Ordered and appointment notes updated.

## 2023-08-05 NOTE — Addendum Note (Signed)
Addended by: Marlene Lard on: 08/05/2023 08:09 AM   Modules accepted: Orders

## 2023-08-06 ENCOUNTER — Ambulatory Visit (HOSPITAL_BASED_OUTPATIENT_CLINIC_OR_DEPARTMENT_OTHER): Payer: Medicare PPO | Admitting: *Deleted

## 2023-08-06 ENCOUNTER — Telehealth (HOSPITAL_BASED_OUTPATIENT_CLINIC_OR_DEPARTMENT_OTHER): Payer: Self-pay | Admitting: Family

## 2023-08-06 ENCOUNTER — Other Ambulatory Visit: Payer: Medicare PPO

## 2023-08-06 ENCOUNTER — Encounter (HOSPITAL_BASED_OUTPATIENT_CLINIC_OR_DEPARTMENT_OTHER): Payer: Self-pay

## 2023-08-06 DIAGNOSIS — Z01812 Encounter for preprocedural laboratory examination: Secondary | ICD-10-CM | POA: Diagnosis not present

## 2023-08-06 DIAGNOSIS — I2510 Atherosclerotic heart disease of native coronary artery without angina pectoris: Secondary | ICD-10-CM | POA: Diagnosis not present

## 2023-08-06 DIAGNOSIS — E785 Hyperlipidemia, unspecified: Secondary | ICD-10-CM | POA: Diagnosis not present

## 2023-08-06 NOTE — Telephone Encounter (Signed)
Walk in today for reassessment of leg wound.   He was scheduled for [facility] 07/28/23 which was cancelled due to RLE wound and cellulitis. 07/28/23 wbc 7.4. He was given course of Cephalexin 500mg  four times per day for 6 days. He completed course of antibiotics yesterday. Bilateral posterior tibialis pulses are 2+. No erythema, fever. CBC, BMP collected today as part of pre cath workup. LHC has been rescheduled for 08/11/23. Will route to Dr. Lynnette Caffey to ensure still appropriate to proceed.    Wound photo 08/06/23 (prior wound photo under media tab)   Erik Sorrow, NP

## 2023-08-07 LAB — CBC
Hematocrit: 41.5 % (ref 37.5–51.0)
Hemoglobin: 13.7 g/dL (ref 13.0–17.7)
MCH: 30.4 pg (ref 26.6–33.0)
MCHC: 33 g/dL (ref 31.5–35.7)
MCV: 92 fL (ref 79–97)
Platelets: 312 10*3/uL (ref 150–450)
RBC: 4.51 x10E6/uL (ref 4.14–5.80)
RDW: 12.3 % (ref 11.6–15.4)
WBC: 7.1 10*3/uL (ref 3.4–10.8)

## 2023-08-07 LAB — BASIC METABOLIC PANEL
BUN/Creatinine Ratio: 16 (ref 10–24)
BUN: 19 mg/dL (ref 8–27)
CO2: 16 mmol/L — ABNORMAL LOW (ref 20–29)
Calcium: 10.1 mg/dL (ref 8.6–10.2)
Chloride: 111 mmol/L — ABNORMAL HIGH (ref 96–106)
Creatinine, Ser: 1.17 mg/dL (ref 0.76–1.27)
Glucose: 91 mg/dL (ref 70–99)
Potassium: 4.4 mmol/L (ref 3.5–5.2)
Sodium: 144 mmol/L (ref 134–144)
eGFR: 67 mL/min/{1.73_m2} (ref >59)

## 2023-08-09 ENCOUNTER — Other Ambulatory Visit: Payer: Self-pay

## 2023-08-09 DIAGNOSIS — G5603 Carpal tunnel syndrome, bilateral upper limbs: Secondary | ICD-10-CM

## 2023-08-10 ENCOUNTER — Telehealth: Payer: Self-pay | Admitting: *Deleted

## 2023-08-10 NOTE — Telephone Encounter (Signed)
Cardiac Catheterization scheduled at [facility] for: Wednesday August 11, 2023 8:30 AM Arrival time [facility] Main Entrance A at: 6:30 AM  Nothing to eat after midnight prior to procedure, clear liquids until 5 AM day of procedure.  Medication instructions: -Hold:  Tresiba-AM of procedure/1/2 usual Insulin dose HS prior to procedure  Semaglutide-weekly on Mondays-did not take 08/09/23  Spironolactone/Lasix-AM of procedure -Other usual morning medications can be taken with sips of water including aspirin 81 mg and Brilinta 60 mg.  Plan to go home the same day, you will only stay overnight if medically necessary.  You must have responsible adult to drive you home.  Someone must be with you the first 24 hours after you arrive home.  Patient's  PCP 07/29/23 possible kidney stone, pt's wife reports patient urinating without problems, afebrile.  Reviewed procedure instructions with patient's wife (DPR), Darl Pikes.

## 2023-08-11 ENCOUNTER — Other Ambulatory Visit: Payer: Self-pay

## 2023-08-11 ENCOUNTER — Encounter (HOSPITAL_COMMUNITY)
Admission: RE | Disposition: A | Discharge status: Home / Self Care | Payer: Self-pay | Source: Home / Self Care | Attending: Internal Medicine

## 2023-08-11 ENCOUNTER — Ambulatory Visit (HOSPITAL_COMMUNITY)
Admission: RE | Admit: 2023-08-11 | Discharge: 2023-08-11 | Disposition: A | Discharge status: Home / Self Care | Payer: Medicare PPO | Attending: Internal Medicine | Admitting: Internal Medicine

## 2023-08-11 DIAGNOSIS — Z7902 Long term (current) use of antithrombotics/antiplatelets: Secondary | ICD-10-CM | POA: Diagnosis not present

## 2023-08-11 DIAGNOSIS — Z7982 Long term (current) use of aspirin: Secondary | ICD-10-CM | POA: Diagnosis not present

## 2023-08-11 DIAGNOSIS — E1122 Type 2 diabetes mellitus with diabetic chronic kidney disease: Secondary | ICD-10-CM | POA: Insufficient documentation

## 2023-08-11 DIAGNOSIS — E785 Hyperlipidemia, unspecified: Secondary | ICD-10-CM | POA: Diagnosis not present

## 2023-08-11 DIAGNOSIS — I2584 Coronary atherosclerosis due to calcified coronary lesion: Secondary | ICD-10-CM | POA: Diagnosis not present

## 2023-08-11 DIAGNOSIS — Z955 Presence of coronary angioplasty implant and graft: Secondary | ICD-10-CM | POA: Insufficient documentation

## 2023-08-11 DIAGNOSIS — Z79899 Other long term (current) drug therapy: Secondary | ICD-10-CM | POA: Diagnosis not present

## 2023-08-11 DIAGNOSIS — R0609 Other forms of dyspnea: Secondary | ICD-10-CM | POA: Diagnosis not present

## 2023-08-11 DIAGNOSIS — Z6841 Body Mass Index (BMI) 40.0 and over, adult: Secondary | ICD-10-CM | POA: Insufficient documentation

## 2023-08-11 DIAGNOSIS — R600 Localized edema: Secondary | ICD-10-CM | POA: Insufficient documentation

## 2023-08-11 DIAGNOSIS — I129 Hypertensive chronic kidney disease with stage 1 through stage 4 chronic kidney disease, or unspecified chronic kidney disease: Secondary | ICD-10-CM | POA: Diagnosis not present

## 2023-08-11 DIAGNOSIS — I714 Abdominal aortic aneurysm, without rupture, unspecified: Secondary | ICD-10-CM | POA: Diagnosis not present

## 2023-08-11 DIAGNOSIS — I440 Atrioventricular block, first degree: Secondary | ICD-10-CM | POA: Insufficient documentation

## 2023-08-11 DIAGNOSIS — G4733 Obstructive sleep apnea (adult) (pediatric): Secondary | ICD-10-CM | POA: Diagnosis not present

## 2023-08-11 DIAGNOSIS — Z7985 Long-term (current) use of injectable non-insulin antidiabetic drugs: Secondary | ICD-10-CM | POA: Diagnosis not present

## 2023-08-11 DIAGNOSIS — Z86718 Personal history of other venous thrombosis and embolism: Secondary | ICD-10-CM | POA: Insufficient documentation

## 2023-08-11 DIAGNOSIS — I2510 Atherosclerotic heart disease of native coronary artery without angina pectoris: Secondary | ICD-10-CM | POA: Diagnosis not present

## 2023-08-11 DIAGNOSIS — I2582 Chronic total occlusion of coronary artery: Secondary | ICD-10-CM | POA: Diagnosis not present

## 2023-08-11 HISTORY — PX: RIGHT/LEFT HEART CATH AND CORONARY ANGIOGRAPHY: CATH118266

## 2023-08-11 LAB — POCT I-STAT EG7
Acid-base deficit: 4 mmol/L — ABNORMAL HIGH (ref 0.0–2.0)
Bicarbonate: 20.9 mmol/L (ref 20.0–28.0)
Calcium, Ion: 1.13 mmol/L — ABNORMAL LOW (ref 1.15–1.40)
HCT: 35 % — ABNORMAL LOW (ref 39.0–52.0)
Hemoglobin: 11.9 g/dL — ABNORMAL LOW (ref 13.0–17.0)
O2 Saturation: 65 %
Potassium: 3.6 mmol/L (ref 3.5–5.1)
Sodium: 137 mmol/L (ref 135–145)
TCO2: 22 mmol/L (ref 22–32)
pCO2, Ven: 38.8 mmHg — ABNORMAL LOW (ref 44–60)
pH, Ven: 7.34 (ref 7.25–7.43)
pO2, Ven: 36 mmHg (ref 32–45)

## 2023-08-11 LAB — POCT I-STAT 7, (LYTES, BLD GAS, ICA,H+H)
Acid-base deficit: 3 mmol/L — ABNORMAL HIGH (ref 0.0–2.0)
Acid-base deficit: 4 mmol/L — ABNORMAL HIGH (ref 0.0–2.0)
Bicarbonate: 21.4 mmol/L (ref 20.0–28.0)
Bicarbonate: 19.8 mmol/L — ABNORMAL LOW (ref 20.0–28.0)
Calcium, Ion: 1.21 mmol/L (ref 1.15–1.40)
Calcium, Ion: 1.15 mmol/L (ref 1.15–1.40)
HCT: 35 % — ABNORMAL LOW (ref 39.0–52.0)
HCT: 34 % — ABNORMAL LOW (ref 39.0–52.0)
Hemoglobin: 11.9 g/dL — ABNORMAL LOW (ref 13.0–17.0)
Hemoglobin: 11.6 g/dL — ABNORMAL LOW (ref 13.0–17.0)
O2 Saturation: 77 %
O2 Saturation: 97 %
Potassium: 3.7 mmol/L (ref 3.5–5.1)
Potassium: 3.8 mmol/L (ref 3.5–5.1)
Sodium: 142 mmol/L (ref 135–145)
Sodium: 143 mmol/L (ref 135–145)
TCO2: 22 mmol/L (ref 22–32)
TCO2: 21 mmol/L — ABNORMAL LOW (ref 22–32)
pCO2 arterial: 36 mmHg (ref 32–48)
pCO2 arterial: 30 mmHg — ABNORMAL LOW (ref 32–48)
pH, Arterial: 7.382 (ref 7.35–7.45)
pH, Arterial: 7.426 (ref 7.35–7.45)
pO2, Arterial: 42 mmHg — ABNORMAL LOW (ref 83–108)
pO2, Arterial: 86 mmHg (ref 83–108)

## 2023-08-11 LAB — GLUCOSE, CAPILLARY
Glucose-Capillary: 181 mg/dL — ABNORMAL HIGH (ref 70–99)
Glucose-Capillary: 170 mg/dL — ABNORMAL HIGH (ref 70–99)

## 2023-08-11 SURGERY — RIGHT/LEFT HEART CATH AND CORONARY ANGIOGRAPHY
Anesthesia: LOCAL

## 2023-08-11 MED ORDER — FENTANYL CITRATE (PF) 100 MCG/2ML IJ SOLN
INTRAMUSCULAR | Status: AC
  Filled 2023-08-11: qty 2

## 2023-08-11 MED ORDER — LIDOCAINE HCL (PF) 1 % IJ SOLN
INTRAMUSCULAR | Status: DC | PRN
  Administered 2023-08-11: 5 mL

## 2023-08-11 MED ORDER — TICAGRELOR 60 MG PO TABS
60.0000 mg | ORAL_TABLET | ORAL | Status: DC
  Filled 2023-08-11: qty 1

## 2023-08-11 MED ORDER — SODIUM CHLORIDE 0.9 % WEIGHT BASED INFUSION: 1.0000 mL/kg/h | INTRAVENOUS | Status: DC

## 2023-08-11 MED ORDER — IOHEXOL 350 MG/ML SOLN
INTRAVENOUS | Status: DC | PRN
  Administered 2023-08-11: 80 mL

## 2023-08-11 MED ORDER — VERAPAMIL HCL 2.5 MG/ML IV SOLN
INTRAVENOUS | Status: AC
  Filled 2023-08-11: qty 2

## 2023-08-11 MED ORDER — ONDANSETRON HCL 4 MG/2ML IJ SOLN: 4.0000 mg | Freq: Four times a day (QID) | INTRAMUSCULAR | Status: DC | PRN

## 2023-08-11 MED ORDER — HEPARIN (PORCINE) IN NACL 1000-0.9 UT/500ML-% IV SOLN
INTRAVENOUS | Status: DC | PRN
  Administered 2023-08-11 (×2): 500 mL

## 2023-08-11 MED ORDER — HEPARIN SODIUM (PORCINE) 1000 UNIT/ML IJ SOLN
INTRAMUSCULAR | Status: DC | PRN
  Administered 2023-08-11: 5000 [IU] via INTRAVENOUS

## 2023-08-11 MED ORDER — ASPIRIN 81 MG PO CHEW: 81.0000 mg | CHEWABLE_TABLET | ORAL | Status: DC

## 2023-08-11 MED ORDER — LIDOCAINE HCL (PF) 1 % IJ SOLN
INTRAMUSCULAR | Status: AC
  Filled 2023-08-11: qty 30

## 2023-08-11 MED ORDER — MIDAZOLAM HCL 2 MG/2ML IJ SOLN
INTRAMUSCULAR | Status: AC
  Filled 2023-08-11: qty 2

## 2023-08-11 MED ORDER — FENTANYL CITRATE (PF) 100 MCG/2ML IJ SOLN
INTRAMUSCULAR | Status: DC | PRN
  Administered 2023-08-11: 25 ug via INTRAVENOUS

## 2023-08-11 MED ORDER — VERAPAMIL HCL 2.5 MG/ML IV SOLN
INTRAVENOUS | Status: DC | PRN
  Administered 2023-08-11: 10 mL via INTRA_ARTERIAL

## 2023-08-11 MED ORDER — SODIUM CHLORIDE 0.9 % IV SOLN: 250.0000 mL | INTRAVENOUS | Status: DC | PRN

## 2023-08-11 MED ORDER — ACETAMINOPHEN 325 MG PO TABS: 650.0000 mg | ORAL_TABLET | ORAL | Status: DC | PRN

## 2023-08-11 MED ORDER — MIDAZOLAM HCL 2 MG/2ML IJ SOLN
INTRAMUSCULAR | Status: DC | PRN
  Administered 2023-08-11: 1 mg via INTRAVENOUS

## 2023-08-11 MED ORDER — SODIUM CHLORIDE 0.9 % WEIGHT BASED INFUSION
3.0000 mL/kg/h | INTRAVENOUS | Status: AC
  Administered 2023-08-11: 3 mL/kg/h via INTRAVENOUS

## 2023-08-11 MED ORDER — LABETALOL HCL 5 MG/ML IV SOLN: 10.0000 mg | INTRAVENOUS | Status: DC | PRN

## 2023-08-11 MED ORDER — SODIUM CHLORIDE 0.9% FLUSH: 3.0000 mL | INTRAVENOUS | Status: DC | PRN

## 2023-08-11 MED ORDER — HYDRALAZINE HCL 20 MG/ML IJ SOLN: 10.0000 mg | INTRAMUSCULAR | Status: DC | PRN

## 2023-08-11 MED ORDER — SODIUM CHLORIDE 0.9% FLUSH: 3.0000 mL | Freq: Two times a day (BID) | INTRAVENOUS | Status: DC

## 2023-08-11 MED ORDER — HEPARIN SODIUM (PORCINE) 1000 UNIT/ML IJ SOLN
INTRAMUSCULAR | Status: AC
  Filled 2023-08-11: qty 10

## 2023-08-11 SURGICAL SUPPLY — 13 items
CATH BALLN WEDGE 5F 110CM (CATHETERS) IMPLANT
CATH INFINITI 5FR ANG PIGTAIL (CATHETERS) IMPLANT
CATH INFINITI AMBI 6FR TG (CATHETERS) IMPLANT
CATH INFINITI JR4 5F (CATHETERS) IMPLANT
DEVICE RAD COMP TR BAND LRG (VASCULAR PRODUCTS) IMPLANT
GLIDESHEATH SLEND SS 6F .021 (SHEATH) IMPLANT
GUIDEWIRE .025 260CM (WIRE) IMPLANT
KIT SYRINGE INJ CVI SPIKEX1 (MISCELLANEOUS) IMPLANT
PACK CARDIAC CATHETERIZATION (CUSTOM PROCEDURE TRAY) ×2 IMPLANT
SET ATX-X65L (MISCELLANEOUS) IMPLANT
SHEATH GLIDE SLENDER 4/5FR (SHEATH) IMPLANT
WIRE EMERALD 3MM-J .035X260CM (WIRE) IMPLANT
WIRE HI TORQ VERSACORE-J 145CM (WIRE) IMPLANT

## 2023-08-11 NOTE — Interval H&P Note (Signed)
History and Physical Interval Note:  08/11/2023 7:24 AM  Erik Hancock  has presented today for surgery, with the diagnosis of shortness of breath.  The various methods of treatment have been discussed with the patient and family. After consideration of risks, benefits and other options for treatment, the patient has consented to  Procedure(s): RIGHT/LEFT HEART CATH AND CORONARY ANGIOGRAPHY (N/A) as a surgical intervention.  The patient's history has been reviewed, patient examined, no change in status, stable for surgery.  I have reviewed the patient's chart and labs.  Questions were answered to the patient's satisfaction.     [name]

## 2023-08-11 NOTE — Discharge Instructions (Signed)
Brachial Site Care   This sheet gives you information about how to care for yourself after your procedure. Your health care provider may also give you more specific instructions. If you have problems or questions, contact your health care provider. What can I expect after the procedure? After the procedure, it is common to have: Bruising and tenderness at the catheter insertion area. Follow these instructions at home:  Insertion site care Follow instructions from your health care provider about how to take care of your insertion site. Make sure you: Wash your hands with soap and water before you change your bandage (dressing). If soap and water are not available, use hand sanitizer. Remove your dressing as told by your health care provider. In 24 hours Check your insertion site every day for signs of infection. Check for: Redness, swelling, or pain. Pus or a bad smell. Warmth. You may shower 24-48 hours after the procedure. Do not apply powder or lotion to the site.  Activity For 24 hours after the procedure, or as directed by your health care provider: Do not push or pull heavy objects with the affected arm. Do not drive yourself home from the hospital or clinic. You may drive 24 hours after the procedure unless your health care provider tells you not to. Do not lift anything that is heavier than 10 lb (4.5 kg), or the limit that you are told, until your health care provider says that it is safe.  For 24 hours    Radial Site Care  This sheet gives you information about how to care for yourself after your procedure. Your health care provider may also give you more specific instructions. If you have problems or questions, contact your health care provider. What can I expect after the procedure? After the procedure, it is common to have: Bruising and tenderness at the catheter insertion area. Follow these instructions at home: Medicines Take over-the-counter and prescription  medicines only as told by your health care provider. Insertion site care Follow instructions from your health care provider about how to take care of your insertion site. Make sure you: Wash your hands with soap and water before you remove your bandage (dressing). If soap and water are not available, use hand sanitizer. May remove dressing in 24 hours. Check your insertion site every day for signs of infection. Check for: Redness, swelling, or pain. Fluid or blood. Pus or a bad smell. Warmth. Do no take baths, swim, or use a hot tub for 5 days. You may shower 24-48 hours after the procedure. Remove the dressing and gently wash the site with plain soap and water. Pat the area dry with a clean towel. Do not rub the site. That could cause bleeding. Do not apply powder or lotion to the site. Activity  For 24 hours after the procedure, or as directed by your health care provider: Do not flex or bend the affected arm. Do not push or pull heavy objects with the affected arm. Do not drive yourself home from the hospital or clinic. You may drive 24 hours after the procedure. Do not operate machinery or power tools. KEEP ARM ELEVATED THE REMAINDER OF THE DAY. Do not push, pull or lift anything that is heavier than 10 lb for 5 days. Ask your health care provider when it is okay to: Return to work or school. Resume usual physical activities or sports. Resume sexual activity. General instructions If the catheter site starts to bleed, raise your arm and put firm pressure  on the site. If the bleeding does not stop, get help right away. This is a medical emergency. DRINK PLENTY OF FLUIDS FOR THE NEXT 2-3 DAYS. No alcohol consumption for 24 hours after receiving sedation. If you went home on the same day as your procedure, a responsible adult should be with you for the first 24 hours after you arrive home. Keep all follow-up visits as told by your health care provider. This is important. Contact a  health care provider if: You have a fever. You have redness, swelling, or yellow drainage around your insertion site. Get help right away if: You have unusual pain at the radial site. The catheter insertion area swells very fast. The insertion area is bleeding, and the bleeding does not stop when you hold steady pressure on the area. Your arm or hand becomes pale, cool, tingly, or numb. These symptoms may represent a serious problem that is an emergency. Do not wait to see if the symptoms will go away. Get medical help right away. Call your local emergency services (911 in the [place] Do not drive yourself to the hospital. Summary After the procedure, it is common to have bruising and tenderness at the site. Follow instructions from your health care provider about how to take care of your radial site wound. Check the wound every day for signs of infection.  This information is not intended to replace advice given to you by your health care provider. Make sure you discuss any questions you have with your health care provider. Document Revised: 12/15/2017 Document Reviewed: 12/15/2017 Elsevier Patient Education  2020 [employer].

## 2023-08-12 ENCOUNTER — Encounter (HOSPITAL_COMMUNITY): Payer: Self-pay | Admitting: Internal Medicine

## 2023-08-17 ENCOUNTER — Telehealth: Payer: Self-pay | Admitting: Family Medicine

## 2023-08-17 ENCOUNTER — Other Ambulatory Visit: Payer: Self-pay

## 2023-08-17 DIAGNOSIS — K219 Gastro-esophageal reflux disease without esophagitis: Secondary | ICD-10-CM

## 2023-08-17 MED ORDER — PANTOPRAZOLE SODIUM 40 MG PO TBEC: 40.0000 mg | DELAYED_RELEASE_TABLET | Freq: Every day | ORAL | 1 refills | Status: DC

## 2023-08-17 NOTE — Telephone Encounter (Signed)
Prescription Request  08/17/2023  LOV: 07/29/2023  What is the name of the medication or equipment?   pantoprazole (PROTONIX) 40 MG tablet  **90 day supply needed** **Out of pills* **Permanently changed to [facility]**  Have you contacted your pharmacy to request a refill? Yes    Which pharmacy would you like this sent to?  [facility] - [place], [place] - [address] [place] [address] Phone: [phone] Fax: [phone]    Patient notified that their request is being sent to the clinical staff for review and that they should receive a response within 2 business days.   Please advise patient at [phone].

## 2023-09-08 ENCOUNTER — Telehealth: Payer: Self-pay

## 2023-09-08 NOTE — Telephone Encounter (Signed)
Pt's wife called in to ask if records could be sent to the hand specialist. Pt's wife stated that she wanted to take records to specialist for pt's appt. Please advise.  Cb#: [phone]

## 2023-09-10 ENCOUNTER — Ambulatory Visit (HOSPITAL_BASED_OUTPATIENT_CLINIC_OR_DEPARTMENT_OTHER): Payer: Medicare PPO | Admitting: Family

## 2023-09-10 NOTE — Progress Notes (Deleted)
Cardiology Office Note:  .   Date:  09/10/2023  ID:  Erik Hancock, DOB [birth year], MRN [id number] PCP: Donita Brooks, MD  [facility] Providers Cardiologist:  Chilton Si, MD { Click to update primary MD,subspecialty MD or APP then REFRESH:1}   History of Present Illness: .   Erik Hancock is a 72 y.o. male  with a hx of CAD s/p PCI, mild ascending aortic aneurysm, OSA, DVTs, hypertension, hyperlipidemia.   2012 he had angina and underwent PCI of the LAD.  Echo 01/2014 LVEF 60 to 65%, moderately dilated RV.     Established with Dr. Duke Salvia October 2019 due to exertional dyspnea.  It was similar to when he previously required coronary stenting. Underwent LHC at that time revealing three-vessel CAD with stents in the LAD, left circumflex, PDA.  He was noted 95% ostial LAD lesion that was successfully treated with scoring balloon angioplasty and DES.  He had a sleep study that was positive and felt he would benefit from BiPAP but he declined.  02/2020 Brilinta was reduced to 60 mg twice daily.  October 2022 reported atypical chest pain with nuclear stress test 09/2021 with low risk EF 56%.  Echo 09/2021 grade 1 diastolic dysfunction and ascending aorta 3.8 cm.  He had home sleep study and CPAP was recommended.  Later started on valsartan due to poorly controlled blood pressure.  He had ABIs 01/2022 and arteries were noncompressible.  Echo at that time LVEF 60 to 65%, moderate LVH, ascending aorta 3.8 cm, RA pressure 8 mmHg.  Underwent left knee replacement 05/2022.  Has previously been able to tolerate AV nodal agents due to bradycardia or thiazide diuretics due to hypokalemia.  Given ankle edema amlodipine previously deferred.   Seen 03/05/2023 at which time he noted intermittent exertional dyspnea.  He had 1 episode of chest pain the month prior characterized by aching pain not associate with shortness of breath.  He also noted lower extremity edema for which furosemide was started.    Echo 04/06/2023 normal LVEF, impaired diastolic function, ascending aorta 41 mm recommended for monitoring in 1 year.  Myoview 5/50/24 with intermediate risk with EF 48% (likely falsely low) and mild inferior wall defect from the base to the mid wall at rest which worsens with consistent with inferior infarct with peri-infarct ischemia.   Seen 04/09/23. Valsartan decreased by half to allow addition of Imdur 30mg  daily due to dyspnea concerning for anginal equivalent.    Underwent LHC 08/11/2023 with CTO of ostial to proximal RCA collateralized by left system, patent proximal to mid LAD stents and patent mid LCx stent with mild to moderate diffuse disease elsewhere.  Recommended for medical therapy.  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 No chest pain, pressure, tightness, edema.     ROS: Please see the history of present illness.    All other systems reviewed and are negative.   Studies Reviewed: .        *** Risk Assessment/Calculations:   {Does this patient have ATRIAL FIBRILLATION?:[phone]} No BP recorded.  {Refresh Note OR Click here to enter BP  :1}***   STOP-Bang Score:     { Consider Dx Sleep Disordered Breathing or Sleep Apnea  ICD G47.33          :  1}    Physical Exam:   VS:  There were no vitals taken for this visit.   Wt Readings from Last 3 Encounters:  08/11/23 280 lb (127 kg)  07/29/23 286 lb (129.7 kg)  07/28/23 285 lb (129.3 kg)    GEN: Well nourished, well developed in no acute distress NECK: No JVD; No carotid bruits CARDIAC: ***RRR, no murmurs, rubs, gallops RESPIRATORY:  Clear to auscultation without rales, wheezing or rhonchi  ABDOMEN: Soft, non-tender, non-distended EXTREMITIES:  No edema; No deformity    ASSESSMENT AND PLAN: .    Exertional dyspnea / CAD s/p PCI / HLD -LHC 07/28/23 with CTO of RCA and patent LAD and LCx stent. Mild to moderate nonobstructive disease otherwise recommended for medical therapy. GDMT Aspirin, Brilinta, Rosuvastatin, Imdur.  No BB due to previous bradycardia, 1st degree AV block.S***   LE edema - Echo 03/2023 normal LVEF 60 to 65% with gr1DD.  Anticipate venous insufficiency, at working on his feet in the heat on his property contributory.  Continue present dose Lasix, spironolactone. Recommend leg elevation compression socks.   OSA-presently untreated.  Encouraged to consider CPAP.Not addressed at this clinic visit, readdress at follow up.    First-degree AV block-continue to monitor with periodic EKG.  Beta-blockers avoided due to prior bradycardia.   Morbid obesity/diabetes-follows with Dr. Elvera Lennox of endocrinology. Appreciate inclusion of Ozempic.    Ascending aorta dilation -41 mm by echo 03/2023.  Repeat echo in 1 year already ordered.  Continue optimal BP control.  Recommend avoidance of fluoroquinolones.   Hypertension- Continue Valsartan 160mg  daily, continue spironolactone 25 mg daily, furosemide 20 mg daily, Imdur 30mg  daily. Discussed to monitor BP at home at least 2 hours after medications and sitting for 5-10 minutes.        {Are you ordering a CV Procedure (e.g. stress test, cath, DCCV, TEE, etc)?   Press F2        :[id number]}  Dispo: ***  Signed, Alver Sorrow, NP

## 2023-09-14 NOTE — Telephone Encounter (Signed)
Pt's wife called back in to ask if pt's results from test he had done for his hand be faxed over to [facility] please.   Please fax info to [phone] Attn: Lelon Mast

## 2023-09-22 DIAGNOSIS — E1159 Type 2 diabetes mellitus with other circulatory complications: Secondary | ICD-10-CM | POA: Diagnosis not present

## 2023-09-23 DIAGNOSIS — G5602 Carpal tunnel syndrome, left upper limb: Secondary | ICD-10-CM | POA: Diagnosis not present

## 2023-09-23 DIAGNOSIS — G5601 Carpal tunnel syndrome, right upper limb: Secondary | ICD-10-CM | POA: Diagnosis not present

## 2023-09-23 DIAGNOSIS — G5603 Carpal tunnel syndrome, bilateral upper limbs: Secondary | ICD-10-CM | POA: Diagnosis not present

## 2023-09-24 ENCOUNTER — Encounter: Payer: Self-pay | Admitting: Internal Medicine

## 2023-09-24 ENCOUNTER — Ambulatory Visit: Payer: Medicare PPO | Admitting: Internal Medicine

## 2023-09-24 VITALS — BP 130/70 | HR 86 | Ht 66.0 in | Wt 287.4 lb

## 2023-09-24 DIAGNOSIS — Z7985 Long-term (current) use of injectable non-insulin antidiabetic drugs: Secondary | ICD-10-CM

## 2023-09-24 DIAGNOSIS — E785 Hyperlipidemia, unspecified: Secondary | ICD-10-CM | POA: Diagnosis not present

## 2023-09-24 DIAGNOSIS — E1165 Type 2 diabetes mellitus with hyperglycemia: Secondary | ICD-10-CM

## 2023-09-24 DIAGNOSIS — E1159 Type 2 diabetes mellitus with other circulatory complications: Secondary | ICD-10-CM | POA: Diagnosis not present

## 2023-09-24 DIAGNOSIS — Z794 Long term (current) use of insulin: Secondary | ICD-10-CM

## 2023-09-24 MED ORDER — TRESIBA FLEXTOUCH 200 UNIT/ML ~~LOC~~ SOPN
28.0000 [IU] | PEN_INJECTOR | Freq: Every day | SUBCUTANEOUS | Status: DC
Start: 2023-09-24 — End: 2023-10-12

## 2023-09-24 NOTE — Patient Instructions (Addendum)
Please continue: - Ozempic 1 mg weekly  Increase: - Tresiba 28 units at bedtime  Change: - Novolog 20-30 units 2x daily 15 min before the meals  Check sugars later in the day, also!  Please return in 3-4 months.

## 2023-09-24 NOTE — Progress Notes (Signed)
Patient ID: Erik Hancock, male   DOB: May 10, 1951, 72 y.o.   MRN: [id number]  HPI: Erik Hancock is a 72 y.o.-year-old male, returning for follow-up for DM2, dx in 2002, insulin-dependent since 2010, uncontrolled, with complications (CAD - s/p PTCA, CKD stage IIIa, DR). Pt. previously saw Dr. Everardo All, but last visit with me 6 months ago. He is here with his wife who offers part of the history, especially regarding his medications, past medical history, diet, and activity.  Interim history: No increased urination, blurry vision, nausea, chest pain. He was in the emergency room in 07/2023 with cellulitis of the right anterior lower leg.  This improved. He retried a CGM >> again coming off. CBG 336 today - steroid inj x2 in hands yesterday - carpal tunnel. He may need surgery.  Reviewed HbA1c: Lab Results  Component Value Date   HGBA1C 7.3 (H) 07/09/2023   HGBA1C 7.5 (A) 03/18/2023   HGBA1C 6.6 (H) 05/21/2022   HGBA1C 7.3 (A) 03/30/2022   HGBA1C 8.4 (H) 02/11/2022   HGBA1C 8.0 (A) 08/21/2021   HGBA1C 8.8 (A) 05/20/2021   HGBA1C 9.7 (A) 01/28/2021   HGBA1C 7.6 (A) 06/17/2020   HGBA1C 7.7 (A) 03/11/2020   Previously on: - Victoza 1.8 mg before breakfast - Lantus 24 units at bedtime - Novolog 30 units 2x a day, after breakfast and at bedtime! He was taken off Jardiance in the hospital in 01/2022. He tried Metformin >> stopped.  Currently on: - Tresiba 24 units at bedtime - Novolog 20-30 >> 30 units 2x daily 15 min before the meals -not varying the dose between 20 and 30 units as advised at last visit - Ozempic 0.5 >> 1 >> 2 mg weekly  Pt. Tried a Libre CGM >> kept coming off.  They were planning to get securing tape to put on top of the sensor.  Pt checks his sugars still only in the morning: - am: 60s, 108-233 >> 165, 280 >> 120-180 - 2h after b'fast: n/c   - before lunch: n/c - 2h after lunch: n/c - before dinner: n/c - 2h after dinner: n/c - bedtime: n/c - nighttime:  n/c Lowest sugar was 60s >> 63; he has hypoglycemia awareness at 70.  Highest sugar was 200s >> 280 >> 336.  Glucometer:AccuChek  - + CKD stage 3a, last BUN/creatinine:  Lab Results  Component Value Date   BUN 19 08/06/2023   BUN 16 07/28/2023   CREATININE 1.17 08/06/2023   CREATININE 1.10 07/28/2023   Lab Results  Component Value Date   MICRALBCREAT 4 07/09/2023  He is on Diovan 160 mg daily.  -+ HL; last set of lipids: Lab Results  Component Value Date   CHOL 113 07/09/2023   HDL 56 07/09/2023   LDLCALC 37 07/09/2023   TRIG 110 07/09/2023   CHOLHDL 2.0 07/09/2023  On Crestor 10 mg daily.  - last eye exam was on 08/07/2021. + DR reportedly.  Available records are from 2019: + DR at that time. Dr. Ashley Royalty. IO injections.  - no numbness and tingling in his feet.  Last foot exam 02/2023.  He also has a history of HTN, kidney stones, GERD. He had a heart cath in 07/2023: one blockage.  ROS: + see HPI  Past Medical History:  Diagnosis Date   Arthritis    "all over" (07/29/2018)   CAD S/P percutaneous coronary angioplasty    a. stent to mLAD 11/2001. b. DES to PDA/mLCx 2007. c. DES to ostial LAD  07/2018.   Chronic lower back pain    CKD (chronic kidney disease), stage III (HCC)    CKD (chronic kidney disease), stage III (HCC)    Diabetic retinopathy (HCC)    Diabetic retinopathy (HCC)    GERD (gastroesophageal reflux disease)    High cholesterol    History of kidney stones    Hypertension    Mobitz type 2 second degree atrioventricular block    a. noted during 07/2018 adm, metoprolol discontinued.   Morbid obesity (HCC) 07/28/2018   Prostatitis    Pulmonary nodule 2013   CT   Sleep apnea    stopbang=5   Suspected sleep apnea    Type II diabetes mellitus (HCC)    Past Surgical History:  Procedure Laterality Date   CARDIAC CATHETERIZATION  JUNE 2003   PATENT LAD STENT/ BODERLINE OBSTRUCTIVE DISEASE POSTERIOR DESCENDING ARTERY/ NORMAL LVF   CATARACT EXTRACTION W/  INTRAOCULAR LENS  IMPLANT, BILATERAL Bilateral 2017   CERVICAL SCOVILLE FORAMINOTOMY W/ EXCISION OF HERNIATED NUCLEC PULPOSUS  2009   C6 - 7   CORONARY ANGIOPLASTY WITH STENT PLACEMENT  03/30/2006   DR EDMUNDS - 90% LCx@OM2  TAXUS  DES 3.0 X 20 -> 3.25 mm,   95% RPDA -- TAXUS DES 3.0 X 16 --> 3.5 mm   CORONARY ANGIOPLASTY WITH STENT PLACEMENT  11/2001   (Dr. Ty Hilts for Dr. Mayford Knife) - mLAD@D2  - TAXUS EXPRESS DES 3.5 x 15    CORONARY ANGIOPLASTY WITH STENT PLACEMENT  07/29/2018   CORONARY STENT INTERVENTION N/A 07/29/2018   Procedure: CORONARY STENT INTERVENTION;  Surgeon: Marykay Lex, MD;  Location: [facility];  Service: Cardiovascular;  Laterality: N/A;   CYSTOSCOPY W/ RETROGRADES  05/04/2012   Procedure: CYSTOSCOPY WITH RETROGRADE PYELOGRAM;  Surgeon: Milford Cage, MD;  Location: [facility];  Service: Urology;  Laterality: Left;   CYSTOSCOPY W/ URETERAL STENT PLACEMENT  05/04/2012   Procedure: CYSTOSCOPY WITH STENT REPLACEMENT;  Surgeon: Milford Cage, MD;  Location: [facility];  Service: Urology;  Laterality: Left;   CYSTOSCOPY WITH STENT PLACEMENT Left 04/04/2012   CYSTOSCOPY WITH URETEROSCOPY  05/04/2012   Procedure: CYSTOSCOPY WITH URETEROSCOPY;  Surgeon: Milford Cage, MD;  Location: [facility];  Service: Urology;  Laterality: Left;       I & D EXTREMITY Right 02/11/2022   Procedure: IRRIGATION AND DEBRIDEMENT  OF HAND AND FOREARM;  Surgeon: Bradly Bienenstock, MD;  Location: [facility];  Service: Orthopedics;  Laterality: Right;   LEFT HEART CATH AND CORONARY ANGIOGRAPHY N/A 07/29/2018   Procedure: LEFT HEART CATH AND CORONARY ANGIOGRAPHY;  Surgeon: Marykay Lex, MD;  Location: [facility];  Service: Cardiovascular;  Laterality: N/A;   LEFT URETEROSCOPIC STONE EXTRACTION  03-14-2003   X2   PARTIAL KNEE ARTHROPLASTY Left 06/02/2022   Procedure: UNICOMPARTMENTAL KNEE;  Surgeon: Sheral Apley, MD;  Location: [facility];  Service: Orthopedics;  Laterality: Left;    RIGHT/LEFT HEART CATH AND CORONARY ANGIOGRAPHY N/A 08/11/2023   Procedure: RIGHT/LEFT HEART CATH AND CORONARY ANGIOGRAPHY;  Surgeon: [name], MD;  Location: [facility];  Service: Cardiovascular;  Laterality: N/A;   Social History   Socioeconomic History   Marital status: Married    Spouse name: Not on file   Number of children: Not on file   Years of education: Not on file   Highest education level: Not on file  Occupational History   Not on file  Tobacco Use   Smoking status: Never  Smokeless tobacco: Never  Vaping Use   Vaping status: Never Used  Substance and Sexual Activity   Alcohol use: Never   Drug use: Never   Sexual activity: Not Currently  Other Topics Concern   Not on file  Social History Narrative   Right handed    Lives with wife    Social Determinants of Health   Financial Resource Strain: Low Risk  (12/08/2022)   Overall Financial Resource Strain (CARDIA)    Difficulty of Paying Living Expenses: Not hard at all  Food Insecurity: No Food Insecurity (12/08/2022)   Hunger Vital Sign    Worried About Running Out of Food in the Last Year: Never true    Ran Out of Food in the Last Year: Never true  Transportation Needs: No Transportation Needs (12/08/2022)   PRAPARE - Administrator, Civil Service (Medical): No    Lack of Transportation (Non-Medical): No  Physical Activity: Inactive (12/08/2022)   Exercise Vital Sign    Days of Exercise per Week: 0 days    Minutes of Exercise per Session: 0 min  Stress: No Stress Concern Present (12/08/2022)   [employer] of Occupational Health - Occupational Stress Questionnaire    Feeling of Stress : Not at all  Social Connections: Moderately Integrated (12/08/2022)   Social Connection and Isolation Panel [NHANES]    Frequency of Communication with Friends and Family: More than three times a week    Frequency of Social Gatherings with Friends and Family: Three times a week    Attends Religious  Services: 1 to 4 times per year    Active Member of Clubs or Organizations: No    Attends Banker Meetings: Never    Marital Status: Married  Catering manager Violence: Not At Risk (12/08/2022)   Humiliation, Afraid, Rape, and Kick questionnaire    Fear of Current or Ex-Partner: No    Emotionally Abused: No    Physically Abused: No    Sexually Abused: No   Current Outpatient Medications on File Prior to Visit  Medication Sig Dispense Refill   acetaminophen (TYLENOL) 500 MG tablet Take 2 tablets (1,000 mg total) by mouth every 6 (six) hours as needed for mild pain or moderate pain. 60 tablet 0   aspirin 81 MG tablet Take 81 mg by mouth daily.     furosemide (LASIX) 20 MG tablet Take 1 tablet (20 mg total) by mouth daily. 90 tablet 2   Insulin Aspart FlexPen (NOVOLOG) 100 UNIT/ML Inject 30 Units as directed 2 (two) times daily.     Insulin Pen Needle 32G X 4 MM MISC Use 4x a day 300 each 3   isosorbide mononitrate (IMDUR) 30 MG 24 hr tablet Take 1 tablet (30 mg total) by mouth daily. 90 tablet 3   neomycin-bacitracin-polymyxin (NEOSPORIN) OINT Apply 1 Application topically as needed for wound care.     nitroGLYCERIN (NITROLINGUAL) 0.4 MG/SPRAY spray Place 1 spray under the tongue every 5 (five) minutes x 3 doses as needed for chest pain. 25 g 4   pantoprazole (PROTONIX) 40 MG tablet Take 1 tablet (40 mg total) by mouth daily. 90 tablet 1   rosuvastatin (CRESTOR) 10 MG tablet Take 1 tablet (10 mg total) by mouth daily. 90 tablet 3   Semaglutide, 2 MG/DOSE, 8 MG/3ML SOPN Inject 2 mg as directed once a week. 9 mL 3   spironolactone (ALDACTONE) 25 MG tablet Take 1 tablet (25 mg total) by mouth daily. 90 tablet 2  tamsulosin (FLOMAX) 0.4 MG CAPS capsule Take 1 capsule (0.4 mg total) by mouth daily. 30 capsule 0   ticagrelor (BRILINTA) 60 MG TABS tablet Take 1 tablet (60 mg total) by mouth 2 (two) times daily. 180 tablet 1   TRESIBA FLEXTOUCH 200 UNIT/ML FlexTouch Pen Inject 24 Units  into the skin daily. 9 mL 1   valsartan (DIOVAN) 160 MG tablet Take 1 tablet (160 mg total) by mouth daily. 90 tablet 3   No current facility-administered medications on file prior to visit.   Allergies  Allergen Reactions   Beta Adrenergic Blockers     Metoprolol stopped 07/2018 due to 2nd degree type 2 AV block.   Family History  Problem Relation Age of Onset   Hypertension Mother    Heart attack Father    Heart disease Father    Alzheimer's disease Father    Diabetes Father    Heart attack Paternal Grandfather    Heart disease Paternal Grandfather    Lupus Sister    PE: BP 130/70   Pulse 86   Ht 5\' 6"  (1.676 m)   Wt 287 lb 6.4 oz (130.4 kg)   SpO2 96%   BMI 46.39 kg/m  Wt Readings from Last 10 Encounters:  09/24/23 287 lb 6.4 oz (130.4 kg)  08/11/23 280 lb (127 kg)  07/29/23 286 lb (129.7 kg)  07/28/23 285 lb (129.3 kg)  07/23/23 285 lb (129.3 kg)  07/13/23 286 lb 4.8 oz (129.9 kg)  04/09/23 285 lb 4.8 oz (129.4 kg)  04/06/23 281 lb (127.5 kg)  03/18/23 281 lb 9.6 oz (127.7 kg)  03/05/23 284 lb 9.6 oz (129.1 kg)   Constitutional: overweight, in NAD Eyes: no exophthalmos ENT: no thyromegaly, no cervical lymphadenopathy Cardiovascular: RRR, No MRG Respiratory: CTA B Musculoskeletal: no deformities Skin: no rashes Neurological: no tremor with outstretched hands  ASSESSMENT: 1. DM2, insulin-dependent, uncontrolled, with complications - CAD, s/p PTCA - CKD stage 3a - DR  2. HL  3.  Obesity class III  PLAN:  1. Patient with longstanding, uncontrolled, type 2 diabetes, on injectable antidiabetic regimen with weekly GLP-1 receptor agonist and basal-bolus insulin regimen, with the NovoLog dose adjusted at last visit.  He was previously on Victoza, changed to Ozempic.  He was also previously on an SGLT2 inhibitor Marcelline Deist), but this was stopped in the hospital in 01/2022.  We did discuss afterwards about restarting it but his wife was telling me that this was  expensive for them and they would prefer to stay off the medication.  We also tried a CGM but this kept coming off and they were looking for an adhesive to hopefully keep it in place.  At last visit we again discussed about trying to start this.  I sent a prescription for the sensor and receiver to [facility]. -At last visit, patient was taking a higher dose of Ozempic, increased by cardiology 1 week prior to our appointment.  He was tolerating it well so we continued it.  He also had a low blood sugar after taking NovoLog, 30 units and not eating.  I advised him to always take the NovoLog only if he plans to eat.  We also discussed about stopping juice.  For neuropathy, I suggested alpha lipoic acid.  HbA1c at last visit was 7.5%, increased, but he had another HbA1c obtained 2.5 months ago and this was slightly lower, at 7.3%. -At today's visit, he tells me that his Ozempic dose was increased further by cardiology.  He is now taking 2 mg weekly, with good tolerance.  However, unfortunately, he is still only checking sugars in the morning and they are usually above goal.  We discussed again about the importance of checking sugars throughout the day.  He again tried to attach a CGM but it came off.  We discussed about different ways to keep it in place.  If he cannot start this, I advised him to check sugars later in the day, rotating check times.  Will also increase the dose of Tresiba dose to improve his morning sugars.  Upon questioning, he is still not hearing the dose of NovoLog as advised at last visit and takes a fixed dose of 30 units before each meal.  We discussed that this is not adequate, he needs to change the dose based on the size and consistency of his meals.  He will try to do so. -In the office, today, CBG is very high, in the 300s.  Upon questioning, he had 2 steroid injections yesterday for his carpal tunnel.  I advised him that this can increase his blood sugars and advised him to increase  the dose of NovoLog for the next 2 to 3 days. - I suggested to:  Patient Instructions  Please continue: - Ozempic 1 mg weekly  Increase: - Tresiba 28 units at bedtime  Change: - Novolog 20-30 units 2x daily 15 min before the meals  Check sugars later in the day, also!  Please return in 3-4 months.  - we checked his HbA1c: 7.1% (lower) - advised to check sugars at different times of the day - 4x a day, rotating check times - advised for yearly eye exams >> he is not UTD - return to clinic in 3-4 months   2. HL -Reviewed panel from 06/2023: All fractions at goal: Lab Results  Component Value Date   CHOL 113 07/09/2023   HDL 56 07/09/2023   LDLCALC 37 07/09/2023   TRIG 110 07/09/2023   CHOLHDL 2.0 07/09/2023  -Will continue Crestor 10 mg daily.  He has no side effects from it.  3.  Obesity class III -He gained approximately 25 pounds before last visit, and he suspected that this may have been due to fluid retention.  Before last visit he was just started on Lasix by cardiology.   -We increased the Ozempic dose the week prior to our last visit and also since last visit.  He is now on the 2 mg weekly dose.  This should also help with weight loss. -He gained 6 pounds since last visit.  We discussed that he also needs to adjust his diet, not only rely on Ozempic for weight control.  Carlus Pavlov, MD PhD [facility]

## 2023-10-11 ENCOUNTER — Other Ambulatory Visit: Payer: Self-pay | Admitting: Internal Medicine

## 2023-10-11 DIAGNOSIS — E1165 Type 2 diabetes mellitus with hyperglycemia: Secondary | ICD-10-CM

## 2023-10-13 ENCOUNTER — Telehealth: Payer: Self-pay

## 2023-10-13 DIAGNOSIS — E119 Type 2 diabetes mellitus without complications: Secondary | ICD-10-CM

## 2023-10-13 NOTE — Patient Outreach (Signed)
Spoke with patient who is agreeable to referral to ophthalmology. Referral being placed for DM eye exam. Nicholes Rough, CMA Care Guide [employer]

## 2023-10-28 ENCOUNTER — Telehealth: Payer: Self-pay | Admitting: Cardiovascular Disease

## 2023-10-28 DIAGNOSIS — M180 Bilateral primary osteoarthritis of first carpometacarpal joints: Secondary | ICD-10-CM | POA: Diagnosis not present

## 2023-10-28 DIAGNOSIS — G5603 Carpal tunnel syndrome, bilateral upper limbs: Secondary | ICD-10-CM | POA: Diagnosis not present

## 2023-10-28 NOTE — Telephone Encounter (Signed)
       Primary Cardiologist:Tiffany Duke Salvia, MD  Chart reviewed as part of pre-operative protocol coverage. Because of Ravindra K Ruffner's past medical history and time since last visit, he/she will require a follow-up visit in order to better assess preoperative cardiovascular risk.  Pre-op covering staff: - Please schedule Office appointment and call patient to inform them. - Please contact requesting surgeon's office via preferred method (i.e, phone, fax) to inform them of need for appointment prior to surgery.  If applicable, this message will also be routed to pharmacy pool and/or primary cardiologist for input on holding anticoagulant/antiplatelet agent as requested below so that this information is available at time of patient's appointment.   Ronney Asters, NP  10/28/2023, 10:29 AM

## 2023-10-28 NOTE — Telephone Encounter (Signed)
Please address DAPT clearance eval prior to appt. Thanks

## 2023-10-28 NOTE — Telephone Encounter (Signed)
S/w the pt's wife (DPR). Pt has been scheduled to see Ronie Spies, [facility] 11/03/23 @ 8:50 at the [facility] location. Pt's wife thanked me for the help. I will let all parties know of appt.   Also surgeon will need ASA and Brilinta held as well.

## 2023-10-28 NOTE — Telephone Encounter (Signed)
   Pre-operative Risk Assessment    Patient Name: VERBON ALSBROOKS  DOB: [birth year] MRN: [id number]      Request for Surgical Clearance    Procedure:   Right Endoscopic Carpel Tunnel Release  Date of Surgery:  Clearance 11/10/23                                 Surgeon: Dr. Mack Hook Surgeon's Group or Practice Name: [facility] Phone number: [phone] Fax number: [phone]   Type of Clearance Requested:   - Medical  - Pharmacy:  Hold        Type of Anesthesia:  MAC   Additional requests/questions:  Please advise surgeon/provider what medications should be held.  Barbette Reichmann   10/28/2023, 9:24 AM

## 2023-11-03 ENCOUNTER — Encounter: Payer: Self-pay | Admitting: Emergency Medicine

## 2023-11-03 ENCOUNTER — Ambulatory Visit: Payer: Medicare PPO | Attending: Physician Assistant | Admitting: Emergency Medicine

## 2023-11-03 VITALS — BP 142/82 | HR 76 | Ht 66.0 in | Wt 285.8 lb

## 2023-11-03 DIAGNOSIS — I440 Atrioventricular block, first degree: Secondary | ICD-10-CM | POA: Diagnosis not present

## 2023-11-03 DIAGNOSIS — R600 Localized edema: Secondary | ICD-10-CM

## 2023-11-03 DIAGNOSIS — G4733 Obstructive sleep apnea (adult) (pediatric): Secondary | ICD-10-CM | POA: Diagnosis not present

## 2023-11-03 DIAGNOSIS — I77810 Thoracic aortic ectasia: Secondary | ICD-10-CM

## 2023-11-03 DIAGNOSIS — I2510 Atherosclerotic heart disease of native coronary artery without angina pectoris: Secondary | ICD-10-CM | POA: Diagnosis not present

## 2023-11-03 DIAGNOSIS — Z01810 Encounter for preprocedural cardiovascular examination: Secondary | ICD-10-CM

## 2023-11-03 DIAGNOSIS — E785 Hyperlipidemia, unspecified: Secondary | ICD-10-CM | POA: Diagnosis not present

## 2023-11-03 DIAGNOSIS — I10 Essential (primary) hypertension: Secondary | ICD-10-CM | POA: Diagnosis not present

## 2023-11-03 NOTE — Progress Notes (Addendum)
Cardiology Office Note:    Date:  11/03/2023  ID:  [name], DOB 16-Mar-1951, MRN [id number] PCP: Donita Brooks, MD  [facility] Providers Cardiologist:  Chilton Si, MD       Patient Profile:      Erik Hancock is a 72 year old male with past medical history of CAD s/p multiple PCIs, mild ascending aortic aneurysm, OSA, leg wounds, hypertension, hyperlipidemia. Patient clarifies no history of DVT as previously mentioned in PMH.  He had remote PTCA/stent to [facility] 2003, PCI/DES to PDA and mLCx in 2007. He established care with Dr. Duke Salvia in October 2019 due to exertional dyspnea.  He underwent LHC at that time that showed three-vessel CAD with stents to his LAD, left circumflex, PDA.  He was noted to have 95% ostial LAD lesion that was successfully treated with scoring balloon angioplasty and DES.  He did have a sleep study that revealed obstructive sleep apnea and felt that he would benefit from CPAP but he declined.  He had nuclear stress test on 10/12/2021 due to atypical chest pain that was low risk with EF 56%.  Echocardiogram 10/12/2021 showed grade 1 diastolic dysfunction and ascending aorta 3.8 cm.  He had ABIs 02/09/2022 and arteries were noncompressible.  Echo at that time showed LVEF 60-65%, moderate LVH, ascending aorta 3.8 cm, RA pressure 8 mmHg.  He had echocardiogram on 04/06/2023 that showed normal LVEF, impaired diastolic dysfunction, ascending aorta 41 mm recommend monitoring in 1 year, Myoview 03/2023 with intermittent risk with EF 48%, with inferior infarct and peri-infarct ischemia.  He was last seen on 07/23/2023 by Luther Parody NP where he continued to experience exertional dyspnea so R/LHC was ordered.  R/LHC completed on 08/11/2023 showing chronic total occlusion of ostial to proximal RCA that is collateralized by the left system, patent proximal to mid LAD stents and patent mid left circumflex stent with mild to moderate diffuse disease elsewhere.  Medical therapy  was recommended given the total occlusion of RCA does not seem to be an optimal candidate for revascularization given its ostial involvement.      History of Present Illness:  Discussed the use of AI scribe software for clinical note transcription with the patient, who gave verbal consent to proceed.  Erik Hancock is a 72 y.o. male who returns for follow-up postcatheterization and preoperative clearance.  History of Present Illness   Today, he reports an gradual improvement in his shortness of breath and DOE since his cardiac catheterization.  He has not had any chest pains since the catheterization.  He has upcoming carpal tunnel surgery next week to his right wrist and plans to have the left wrist done a few weeks following.  The patient is active, doing light work around the house and yard, and is able to perform activities such as carrying buckets of feed.  He does not experience exertional angina during activity.  The patient's blood pressure is noted to be high during the visit, which he attributes to not taking his medications this morning.  He has not been taking his blood pressure consistently at home.  He does have a history of OSA but did not follow-up to receive CPAP.  He is interested with following back up with sleep medicine to learn more about his options.  He denies chest pain, lower extremity edema, fatigue, palpitations, melena, hematuria, hemoptysis, diaphoresis, weakness, presyncope, syncope, orthopnea, and PND.     Review of Systems  Constitutional: Negative for weight gain and weight loss.  Cardiovascular:  Positive for dyspnea on exertion. Negative for chest pain, claudication, cyanosis, irregular heartbeat, leg swelling, near-syncope, orthopnea, palpitations, paroxysmal nocturnal dyspnea and syncope.  Respiratory:  Positive for shortness of breath. Negative for cough and hemoptysis.   Gastrointestinal:  Negative for abdominal pain, hematochezia and melena.  Genitourinary:   Negative for hematuria.     See HPI    Studies Reviewed:   EKG Interpretation Date/Time:  Wednesday November 03 2023 08:40:42 EST Ventricular Rate:  76 PR Interval:  384 QRS Duration:  98 QT Interval:  406 QTC Calculation: 456 R Axis:   42  Text Interpretation: Sinus rhythm with 1st degree A-V block Nonspecific STTW changes Reconfirmed by Rise Paganini [phone]) on 11/03/2023 1:23:25 PM    Nonspecific STTW changes have waxed/waned over prior EKGs; similar STTW changes noted in 01/2022 though Qtc interval normal now.   Right/left heart catheterization 08/11/2023 1.  Chronic total occlusion of ostial to proximal right coronary artery that is collateralized by the left system. 2.  Patent proximal to mid LAD stents and patent mid left circumflex stent with mild to moderate diffuse disease elsewhere. 3.  Fick cardiac output of 4.5 L/min and Fick cardiac index of 2.2 L/min/m with the following hemodynamics:             Right atrial pressure mean of 10 mmHg             Right ventricular pressure 37/12 with an end-diastolic pressure of 13 mmHg             Wedge pressure mean of 16 mmHg             Pulmonary artery pressure 31/16 with a mean of 27 mmHg             PVR of 2.4 Woods units             Pulmonary artery pulsatility index of 1.5 4.  LVEDP of 24 mmHg. Diagnostic Dominance: Right   Lexiscan stress test 04/06/2023   Findings are consistent with infarction with peri-infarct ischemia. The study is intermediate risk.   No ST deviation was noted.   Left ventricular function is normal. Nuclear stress EF: 48 %. The left ventricular ejection fraction is mildly decreased (45-54%). End diastolic cavity size is normal.   Prior study available for comparison from 09/24/2021.  Echocardiogram 04/06/2023 1. Left ventricular ejection fraction, by estimation, is 60 to 65%. The  left ventricle has normal function. The left ventricle has no regional  wall motion abnormalities. There is mild  left ventricular hypertrophy.  Left ventricular diastolic parameters  are consistent with Grade I diastolic dysfunction (impaired relaxation).  The average left ventricular global longitudinal strain is -20.6 %. The  global longitudinal strain is normal.   2. Right ventricular systolic function is normal. The right ventricular  size is normal.   3. Left atrial size was moderately dilated.   4. Cannot r/o small PFO on limited views provided.   5. Right atrial size was mildly dilated.   6. The mitral valve is abnormal. Trivial mitral valve regurgitation. No  evidence of mitral stenosis.   7. The aortic valve is tricuspid. There is mild calcification of the  aortic valve. There is mild thickening of the aortic valve. Aortic valve  regurgitation is trivial. Aortic valve sclerosis is present, with no  evidence of aortic valve stenosis.   8. Aortic dilatation noted. There is mild dilatation of the ascending  aorta, measuring 41 mm.  9. The inferior vena cava is normal in size with greater than 50%  respiratory variability, suggesting right atrial pressure of 3 mmHg.  Risk Assessment/Calculations:     HYPERTENSION CONTROL Vitals:   11/03/23 0834 11/03/23 0840  BP: (!) 152/80 (!) 142/82    The patient's blood pressure is elevated above target today.  In order to address the patient's elevated BP: Blood pressure will be monitored at home to determine if medication changes need to be made.      STOP-Bang Score:         Physical Exam:   VS:  BP (!) 142/82 (BP Location: Left Arm, Patient Position: Sitting)   Pulse 76   Ht 5\' 6"  (1.676 m)   Wt 285 lb 12.8 oz (129.6 kg)   SpO2 97%   BMI 46.13 kg/m    Wt Readings from Last 3 Encounters:  11/03/23 285 lb 12.8 oz (129.6 kg)  09/24/23 287 lb 6.4 oz (130.4 kg)  08/11/23 280 lb (127 kg)    Constitutional:      Appearance: Normal and healthy appearance.  Neck:     Vascular: JVD normal.  Pulmonary:     Effort: Pulmonary effort is  normal.     Breath sounds: Normal breath sounds.  Chest:     Chest wall: Not tender to palpatation.  Cardiovascular:     PMI at left midclavicular line. Normal rate. Regular rhythm. Normal S1. Normal S2.      Murmurs: There is no murmur.     No gallop.  No click. No rub.  Pulses:    Intact distal pulses.  Edema:    Peripheral edema absent.  Musculoskeletal: Normal range of motion.     Cervical back: Normal range of motion and neck supple. Skin:    General: Skin is warm and dry.  Neurological:     General: No focal deficit present.     Mental Status: Alert and oriented to person, place and time.  Psychiatric:        Behavior: Behavior is cooperative.        Assessment and Plan:  Coronary artery disease -[facility] 08/11/2023 chronic total occlusion of ostial to proximal RCA that is collateralized by the left system.  Patent proximal to mid LAD stents and patent mid LCx stent with mild to moderate diffuse disease elsewhere.  No intervention, medical therapy recommended. -Stable with no anginal symptoms, no indication for ischemic evaluation  -He notes that his exertional dyspnea has improved.  This is likely multifactorial including obesity, sedentary lifestyle, uncontrolled OSA.  - He is maintained on long term DAPT with ASA + lower dose Brilinta. We will f/u his H/H today to ensure stable given iStat value at time of cath. - Not on beta blocker due to baseline bradycardia, long first degree AVB  Lower extremity edema -Echo 04/12/2023 normal LVEF at 60-65% with grade 1 DD (EF of 48% by nuc was likely falsely underestimated) -Has had minimal improvement, likely venous insufficiency -Recommend leg elevation, compression stockings, low-sodium diet -Continue current dose Lasix, spironolactone . OSA -Had positive sleep study in 2022.  Followed with Dr. Mayford Knife but has not followed up -He is interested in starting CPAP, will have him schedule appointment   First-degree AV block -EKG today  shows ongoing marked first degree AV block, no symptoms to suggest symptomatic bradycardia -Recommend avoiding AV nodal blocking agents  Ascending aorta dilation -41 mm by echo 04/12/2023.  Repeat echo 03/2024, has been ordered  Hypertension -BP today  slightly elevated at recheck 142/82 -He did not take his medications this morning.  He inconsistently takes BP at home -He will send 2 weeks worth of blood pressure readings via MyChart -Continue Imdur 30 mg, spironolactone 25 mg, valsartan 160 mg. If needed, can consider increasing valsartan with close BMET f/u. Addition of amlodipine would be another option though less ideal with h/o edema.  Hyperlipidemia -06/2023 LDL 37, TC 110.  LFTs WNL -Continue rosuvastatin 10 mg -Heart healthy diet encouraged  Preoperative cardiovascular clearance According to the Revised Cardiac Risk Index (RCRI), his Perioperative Risk of Major Cardiac Event is (%): 11. His Functional Capacity in METs is: 7.34 according to the Duke Activity Status Index (DASI).   Therefore, based on ACC/AHA guidelines, patient would be at acceptable risk for the planned procedure without further cardiovascular testing. I will route this recommendation to the requesting party via Epic fax function.   Discussed holding Brilinta with Dr. Duke Salvia. Per office protocol, he may hold Brilinta for 5 days prior to procedure. Please resume Brilinta as soon as possible postprocedure, at the discretion of the surgeon. Regarding ASA therapy, we recommend continuation of ASA throughout the perioperative period.  However, if the surgeon feels that cessation of ASA is required in the perioperative period, it may be stopped 5-7 days prior to surgery with a plan to resume it as soon as felt to be feasible from a surgical standpoint in the post-operative period.            Dispo: Follow-up in 1 month with Dr. Mayford Knife for sleep and Dr. Duke Salvia or APP in 6 months  Signed, Denyce Robert, NP

## 2023-11-03 NOTE — Patient Instructions (Addendum)
Medication Instructions:    Your physician recommends that you continue on your current medications as directed. Please refer to the Current Medication list given to you today.   *If you need a refill on your cardiac medications before your next appointment, please call your pharmacy*   Lab Work:   PLEASE GO DOWN STAIRS FIRST FLOOR  [address] :  HEMOGLOBIN  AND    HEMOTACRIT   If you have labs (blood work) drawn today and your tests are completely normal, you will receive your results only by: MyChart Message (if you have MyChart) OR A paper copy in the mail If you have any lab test that is abnormal or we need to change your treatment, we will call you to review the results.   Testing/Procedures: NONE ORDERED  TODAY    Follow-Up: At [facility], you and your health needs are our priority.  As part of our continuing mission to provide you with exceptional heart care, we have created designated Provider Care Teams.  These Care Teams include your primary Cardiologist (physician) and Advanced Practice Providers (APPs -  Physician Assistants and Nurse Practitioners) who all work together to provide you with the care you need, when you need it.  We recommend signing up for the patient portal called "MyChart".  Sign up information is provided on this After Visit Summary.  MyChart is used to connect with patients for Virtual Visits (Telemedicine).  Patients are able to view lab/test results, encounter notes, upcoming appointments, etc.  Non-urgent messages can be sent to your provider as well.   To learn more about what you can do with MyChart, go to [email or web address].    Your next appointment:   IN 1 - 2  MONTHS Dr. Mayford Knife for Sleep                                            AND    6  month(s)   Provider:   Chilton Si, MD  or APP    Other Instructions  PLEASE KEEP A BLOOD PRESSURE LOG FOR 2 WEEKS THEN UPLOAD TO YOUR MYCHART  FOR PROVIDER TO VIEW.

## 2023-11-04 ENCOUNTER — Telehealth: Payer: Self-pay

## 2023-11-04 LAB — HEMOGLOBIN AND HEMATOCRIT, BLOOD
Hematocrit: 43.2 % (ref 37.5–51.0)
Hemoglobin: 14 g/dL (ref 13.0–17.7)

## 2023-11-04 NOTE — Telephone Encounter (Signed)
-----   Message from Denyce Robert sent at 11/04/2023  9:13 AM EST ----- Your hemoglobin and hematocrit have now normalized.  Good result!

## 2023-11-04 NOTE — Telephone Encounter (Signed)
Spoke with wife per DPR and she is aware of lab results verbalized understanding.

## 2023-11-10 DIAGNOSIS — G5601 Carpal tunnel syndrome, right upper limb: Secondary | ICD-10-CM | POA: Diagnosis not present

## 2023-11-15 ENCOUNTER — Other Ambulatory Visit: Payer: Self-pay | Admitting: Internal Medicine

## 2023-11-22 ENCOUNTER — Other Ambulatory Visit (HOSPITAL_BASED_OUTPATIENT_CLINIC_OR_DEPARTMENT_OTHER): Payer: Self-pay | Admitting: Family

## 2023-11-22 ENCOUNTER — Telehealth: Payer: Self-pay | Admitting: Internal Medicine

## 2023-11-22 NOTE — Telephone Encounter (Signed)
LMTRC, he does not have Humalog on his medication list.   J.Laiah Pouncey,RMA

## 2023-11-22 NOTE — Telephone Encounter (Signed)
Please call a refill into [facility] for patients Humalog Pen, pt is completely out of medication. Please advise when done

## 2023-11-30 MED ORDER — NOVOLOG FLEXPEN 100 UNIT/ML ~~LOC~~ SOPN: PEN_INJECTOR | SUBCUTANEOUS | 1 refills | Status: DC

## 2023-11-30 NOTE — Telephone Encounter (Signed)
 I called and spoke with the pt wife and she confirmed that he is on Novolog  and not Humalog .  Refill has been sent.   Requested Prescriptions   Signed Prescriptions Disp Refills   insulin  aspart (NOVOLOG  FLEXPEN) 100 UNIT/ML FlexPen 60 mL 1    Sig: Inject 20 to 30 units under the skin twice a day before meals    Authorizing Provider: TRIXIE FILE    Ordering User: CLEOTILDE ROLIN RAMAN

## 2023-11-30 NOTE — Addendum Note (Signed)
 Addended by: Pollie Meyer on: 11/30/2023 11:00 AM   Modules accepted: Orders

## 2023-12-16 ENCOUNTER — Ambulatory Visit: Payer: Medicare PPO | Admitting: *Deleted

## 2023-12-16 DIAGNOSIS — Z0000 Encounter for general adult medical examination without abnormal findings: Secondary | ICD-10-CM | POA: Diagnosis not present

## 2023-12-16 NOTE — Progress Notes (Signed)
Subjective:   Erik Hancock is a 73 y.o. male who presents for Medicare Annual/Subsequent preventive examination.  Visit Complete: Virtual I connected with  [name] on 12/16/23 by a audio enabled telemedicine application and verified that I am speaking with the correct person using two identifiers.  Patient Location: Home  Provider Location: Home Office  I discussed the limitations of evaluation and management by telemedicine. The patient expressed understanding and agreed to proceed.  Vital Signs: Because this visit was a virtual/telehealth visit, some criteria may be missing or patient reported. Any vitals not documented were not able to be obtained and vitals that have been documented are patient reported.   Unable to  connect to video Cardiac Risk Factors include: advanced age (>40men, >73 women);diabetes mellitus;hypertension;male gender     Objective:    There were no vitals filed for this visit. There is no height or weight on file to calculate BMI.     12/16/2023   10:13 AM 08/11/2023    7:45 AM 07/28/2023    4:03 PM 07/28/2023    2:04 PM 12/08/2022    9:19 AM 06/03/2022    3:47 AM 06/02/2022   10:58 AM  Advanced Directives  Does Patient Have a Medical Advance Directive? No No No Yes;No No No No  Does patient want to make changes to medical advance directive?     No - Patient declined    Would patient like information on creating a medical advance directive? No - Patient declined No - Patient declined No - Patient declined No - Patient declined  No - Patient declined No - Patient declined    Current Medications (verified) Outpatient Encounter Medications as of 12/16/2023  Medication Sig   acetaminophen (TYLENOL) 500 MG tablet Take 2 tablets (1,000 mg total) by mouth every 6 (six) hours as needed for mild pain or moderate pain.   aspirin 81 MG tablet Take 81 mg by mouth daily.   BRILINTA 60 MG TABS tablet TAKE ONE TABLET BY MOUTH TWO TIMES A DAY   furosemide  (LASIX) 20 MG tablet Take 1 tablet (20 mg total) by mouth daily.   insulin aspart (NOVOLOG FLEXPEN) 100 UNIT/ML FlexPen Inject 20 to 30 units under the skin twice a day before meals   Insulin Pen Needle 32G X 4 MM MISC Use 4x a day   neomycin-bacitracin-polymyxin (NEOSPORIN) OINT Apply 1 Application topically as needed for wound care.   nitroGLYCERIN (NITROLINGUAL) 0.4 MG/SPRAY spray Place 1 spray under the tongue every 5 (five) minutes x 3 doses as needed for chest pain.   pantoprazole (PROTONIX) 40 MG tablet Take 1 tablet (40 mg total) by mouth daily.   rosuvastatin (CRESTOR) 10 MG tablet Take 1 tablet (10 mg total) by mouth daily.   Semaglutide, 2 MG/DOSE, 8 MG/3ML SOPN Inject 2 mg as directed once a week.   spironolactone (ALDACTONE) 25 MG tablet Take 1 tablet (25 mg total) by mouth daily.   tamsulosin (FLOMAX) 0.4 MG CAPS capsule Take 1 capsule (0.4 mg total) by mouth daily.   TRESIBA FLEXTOUCH 200 UNIT/ML FlexTouch Pen INJECT 24 UNITS INTO THE SKIN ONCE DAILY   valsartan (DIOVAN) 160 MG tablet Take 1 tablet (160 mg total) by mouth daily.   isosorbide mononitrate (IMDUR) 30 MG 24 hr tablet Take 1 tablet (30 mg total) by mouth daily.   No facility-administered encounter medications on file as of 12/16/2023.    Allergies (verified) Beta adrenergic blockers   History: Past Medical History:  Diagnosis Date   Arthritis    "all over" (07/29/2018)   CAD S/P percutaneous coronary angioplasty    a. stent to mLAD 11/2001. b. DES to PDA/mLCx 2007. c. DES to ostial LAD 07/2018.   Chronic lower back pain    CKD (chronic kidney disease), stage III (HCC)    CKD (chronic kidney disease), stage III (HCC)    Diabetic retinopathy (HCC)    Diabetic retinopathy (HCC)    GERD (gastroesophageal reflux disease)    High cholesterol    History of kidney stones    Hypertension    Mobitz type 2 second degree atrioventricular block    a. noted during 07/2018 adm, metoprolol discontinued.   Morbid obesity  (HCC) 07/28/2018   Prostatitis    Pulmonary nodule 2013   CT   Sleep apnea    stopbang=5   Suspected sleep apnea    Type II diabetes mellitus (HCC)    Past Surgical History:  Procedure Laterality Date   CARDIAC CATHETERIZATION  JUNE 2003   PATENT LAD STENT/ BODERLINE OBSTRUCTIVE DISEASE POSTERIOR DESCENDING ARTERY/ NORMAL LVF   CATARACT EXTRACTION W/ INTRAOCULAR LENS  IMPLANT, BILATERAL Bilateral 2017   CERVICAL SCOVILLE FORAMINOTOMY W/ EXCISION OF HERNIATED NUCLEC PULPOSUS  2009   C6 - 7   CORONARY ANGIOPLASTY WITH STENT PLACEMENT  03/30/2006   DR EDMUNDS - 90% LCx@OM2  TAXUS  DES 3.0 X 20 -> 3.25 mm,   95% RPDA -- TAXUS DES 3.0 X 16 --> 3.5 mm   CORONARY ANGIOPLASTY WITH STENT PLACEMENT  11/2001   (Dr. Ty Hilts for Dr. Mayford Knife) - mLAD@D2  - TAXUS EXPRESS DES 3.5 x 15    CORONARY ANGIOPLASTY WITH STENT PLACEMENT  07/29/2018   CORONARY STENT INTERVENTION N/A 07/29/2018   Procedure: CORONARY STENT INTERVENTION;  Surgeon: Marykay Lex, MD;  Location: [facility];  Service: Cardiovascular;  Laterality: N/A;   CYSTOSCOPY W/ RETROGRADES  05/04/2012   Procedure: CYSTOSCOPY WITH RETROGRADE PYELOGRAM;  Surgeon: Milford Cage, MD;  Location: [facility];  Service: Urology;  Laterality: Left;   CYSTOSCOPY W/ URETERAL STENT PLACEMENT  05/04/2012   Procedure: CYSTOSCOPY WITH STENT REPLACEMENT;  Surgeon: Milford Cage, MD;  Location: [facility];  Service: Urology;  Laterality: Left;   CYSTOSCOPY WITH STENT PLACEMENT Left 04/04/2012   CYSTOSCOPY WITH URETEROSCOPY  05/04/2012   Procedure: CYSTOSCOPY WITH URETEROSCOPY;  Surgeon: Milford Cage, MD;  Location: [facility];  Service: Urology;  Laterality: Left;       I & D EXTREMITY Right 02/11/2022   Procedure: IRRIGATION AND DEBRIDEMENT  OF HAND AND FOREARM;  Surgeon: Bradly Bienenstock, MD;  Location: [facility];  Service: Orthopedics;  Laterality: Right;   LEFT HEART CATH AND CORONARY ANGIOGRAPHY N/A 07/29/2018   Procedure: LEFT HEART CATH AND  CORONARY ANGIOGRAPHY;  Surgeon: Marykay Lex, MD;  Location: [facility];  Service: Cardiovascular;  Laterality: N/A;   LEFT URETEROSCOPIC STONE EXTRACTION  03-14-2003   X2   PARTIAL KNEE ARTHROPLASTY Left 06/02/2022   Procedure: UNICOMPARTMENTAL KNEE;  Surgeon: Sheral Apley, MD;  Location: [facility];  Service: Orthopedics;  Laterality: Left;   RIGHT/LEFT HEART CATH AND CORONARY ANGIOGRAPHY N/A 08/11/2023   Procedure: RIGHT/LEFT HEART CATH AND CORONARY ANGIOGRAPHY;  Surgeon: [name], MD;  Location: [facility];  Service: Cardiovascular;  Laterality: N/A;   Family History  Problem Relation Age of Onset   Hypertension Mother    Heart attack Father    Heart disease Father  Alzheimer's disease Father    Diabetes Father    Heart attack Paternal Grandfather    Heart disease Paternal Grandfather    Lupus Sister    Social History   Socioeconomic History   Marital status: Married    Spouse name: Not on file   Number of children: Not on file   Years of education: Not on file   Highest education level: Not on file  Occupational History   Not on file  Tobacco Use   Smoking status: Never   Smokeless tobacco: Never  Vaping Use   Vaping status: Never Used  Substance and Sexual Activity   Alcohol use: Never   Drug use: Never   Sexual activity: Not Currently  Other Topics Concern   Not on file  Social History Narrative   Right handed    Lives with wife    Social Drivers of Health   Financial Resource Strain: Low Risk  (12/16/2023)   Overall Financial Resource Strain (CARDIA)    Difficulty of Paying Living Expenses: Not hard at all  Food Insecurity: No Food Insecurity (12/16/2023)   Hunger Vital Sign    Worried About Running Out of Food in the Last Year: Never true    Ran Out of Food in the Last Year: Never true  Transportation Needs: No Transportation Needs (12/16/2023)   PRAPARE - Administrator, Civil Service (Medical): No    Lack of  Transportation (Non-Medical): No  Physical Activity: Inactive (12/16/2023)   Exercise Vital Sign    Days of Exercise per Week: 0 days    Minutes of Exercise per Session: 0 min  Stress: No Stress Concern Present (12/16/2023)   [employer] of Occupational Health - Occupational Stress Questionnaire    Feeling of Stress : Not at all  Social Connections: Moderately Integrated (12/16/2023)   Social Connection and Isolation Panel [NHANES]    Frequency of Communication with Friends and Family: Once a week    Frequency of Social Gatherings with Friends and Family: More than three times a week    Attends Religious Services: More than 4 times per year    Active Member of [employer] or Organizations: No    Attends Engineer, structural: Never    Marital Status: Married    Tobacco Counseling Counseling given: Not Answered   Clinical Intake:  Pre-visit preparation completed: Yes  Pain : No/denies pain        How often do you need to have someone help you when you read instructions, pamphlets, or other written materials from your doctor or pharmacy?: 1 - Never  Interpreter Needed?: No  Information entered by :: Remi Haggard LPN   Activities of Daily Living    12/16/2023   10:18 AM  In your present state of health, do you have any difficulty performing the following activities:  Hearing? 1  Vision? 0  Difficulty concentrating or making decisions? 0  Walking or climbing stairs? 0  Dressing or bathing? 0  Doing errands, shopping? 0  Preparing Food and eating ? N  Using the Toilet? N  In the past six months, have you accidently leaked urine? N  Do you have problems with loss of bowel control? N  Managing your Medications? N  Managing your Finances? N    Patient Care Team: Donita Brooks, MD as PCP - General (Family Medicine) Chilton Si, MD as PCP - Cardiology (Cardiology) Tat, Octaviano Batty, DO as Consulting Physician (Neurology) Sherrie George, MD as  Consulting  Physician (Ophthalmology) Carlus Pavlov, MD as Consulting Physician (Internal Medicine)  Indicate any recent Medical Services you may have received from other than [facility] providers in the past year (date may be approximate).     Assessment:   This is a routine wellness examination for Talyn.  Hearing/Vision screen Hearing Screening - Comments:: Does not wear hearing Some trouble hearing aids Vision Screening - Comments:: Not up to date   [facility] Royalty   Goals Addressed             This Visit's Progress    Patient Stated       Get things fixed around the house       Depression Screen    12/16/2023   10:17 AM 12/22/2022    9:00 AM 12/08/2022    9:20 AM 03/04/2022    3:00 PM 08/09/2021    8:22 AM 06/26/2021    9:22 AM 07/04/2020    9:29 AM  PHQ 2/9 Scores  PHQ - 2 Score 0 0 0 0 0 0 0  PHQ- 9 Score 0          Fall Risk    12/16/2023   10:12 AM 12/22/2022    9:00 AM 12/08/2022    9:19 AM 03/04/2022    3:00 PM 12/09/2021   10:32 AM  Fall Risk   Falls in the past year? 0 0 0 0 0  Number falls in past yr: 0 0 0 0   Injury with Fall? 0 0 0 0 0  Risk for fall due to :  No Fall Risks No Fall Risks    Follow up Falls evaluation completed;Education provided;Falls prevention discussed Falls prevention discussed Falls evaluation completed;Education provided;Falls prevention discussed      MEDICARE RISK AT HOME: Medicare Risk at Home Any stairs in or around the home?: No If so, are there any without handrails?: No Home free of loose throw rugs in walkways, pet beds, electrical cords, etc?: Yes Adequate lighting in your home to reduce risk of falls?: Yes Life alert?: No Use of a cane, walker or w/c?: No Grab bars in the bathroom?: No Shower chair or bench in shower?: Yes Elevated toilet seat or a handicapped toilet?: Yes  TIMED UP AND GO:  Was the test performed?  No    Cognitive Function:        12/16/2023   10:15 AM 12/08/2022    9:19 AM 08/09/2021    8:20 AM 07/04/2020     9:30 AM  6CIT Screen  What Year? 0 points 0 points 0 points 0 points  What month? 0 points 0 points 0 points 0 points  What time? 0 points 0 points 0 points 0 points  Count back from 20 0 points 0 points 0 points 0 points  Months in reverse 0 points 0 points 0 points 0 points  Repeat phrase 0 points 2 points 4 points 0 points  Total Score 0 points 2 points 4 points 0 points    Immunizations Immunization History  Administered Date(s) Administered   Fluad Quad(high Dose 65+) 08/08/2019, 09/24/2021   Hep A / Hep B 01/05/2019, 04/05/2019, 09/11/2019   Influenza,inj,Quad PF,6+ Mos 09/07/2013, 08/03/2014, 10/22/2015   Influenza,inj,Quad PF,6-35 Mos 07/06/2018   Influenza-Unspecified 08/08/2019, 12/21/2022   PFIZER(Purple Top)SARS-COV-2 Vaccination 01/15/2020, 02/05/2020, 11/14/2020, 04/25/2021   PNEUMOCOCCAL CONJUGATE-20 04/25/2021   Pfizer(Comirnaty)Fall Seasonal Vaccine 12 years and older 12/21/2022   Pneumococcal Conjugate-13 02/11/2017, 09/04/2017   Pneumococcal Polysaccharide-23 09/07/2013, 07/06/2018   Rsv, Bivalent,  Protein Subunit Rsvpref,pf [name] 12/21/2022   Tdap 02/02/2014, 01/05/2019, 02/11/2022   Zoster Recombinant(Shingrix) 07/06/2018, 10/31/2018    TDAP status: Up to date  Flu Vaccine status: Up to date  Pneumococcal vaccine status: Up to date  Covid-19 vaccine status: Information provided on how to obtain vaccines.   Qualifies for Shingles Vaccine? No   Zostavax completed Yes   Shingrix Completed?: Yes  Screening Tests Health Maintenance  Topic Date Due   OPHTHALMOLOGY EXAM  08/07/2022   COVID-19 Vaccine (6 - 2024-25 season) 01/01/2024 (Originally 07/25/2023)   Hepatitis C Screening  01/26/2024 (Originally 12/19/1968)   INFLUENZA VACCINE  02/21/2024 (Originally 06/24/2023)   HEMOGLOBIN A1C  01/09/2024   Diabetic kidney evaluation - Urine ACR  07/08/2024   FOOT EXAM  07/12/2024   Diabetic kidney evaluation - eGFR measurement  08/05/2024   Medicare  Annual Wellness (AWV)  12/15/2024   Fecal DNA (Cologuard)  01/19/2026   DTaP/Tdap/Td (4 - Td or Tdap) 02/12/2032   Pneumonia Vaccine 70+ Years old  Completed   Zoster Vaccines- Shingrix  Completed   HPV VACCINES  Aged Out    Health Maintenance  Health Maintenance Due  Topic Date Due   OPHTHALMOLOGY EXAM  08/07/2022    Colorectal cancer screening: Type of screening: Cologuard. Completed 2024. Repeat every 3 years  Lung Cancer Screening: (Low Dose CT Chest recommended if Age 64-80 years, 20 pack-year currently smoking OR have quit w/in 15years.) does not qualify.   Lung Cancer Screening Referral:   Additional Screening:  Hepatitis C Screening: Never done  Vision Screening: Recommended annual ophthalmology exams for early detection of glaucoma and other disorders of the eye. Is the patient up to date with their annual eye exam?  No  Who is the provider or what is the name of the office in which the patient attends annual eye exams? [facility]-  patient to schedule If pt is not established with a provider, would they like to be referred to a provider to establish care? No .   Dental Screening: Recommended annual dental exams for proper oral hygiene  Nutrition Risk Assessment:  Has the patient had any N/V/D within the last 2 months?  No  Does the patient have any non-healing wounds?  No  Has the patient had any unintentional weight loss or weight gain?  No   Diabetes:  Is the patient diabetic?  Yes  If diabetic, was a CBG obtained today?  No  Did the patient bring in their glucometer from home?  No  How often do you monitor your CBG's? 1-2 times a day.   Financial Strains and Diabetes Management:  Are you having any financial strains with the device, your supplies or your medication? No .  Does the patient want to be seen by Chronic Care Management for management of their diabetes?  No  Would the patient like to be referred to a Nutritionist or for Diabetic Management?  No    Diabetic Exams:  Diabetic Eye Exam:. Overdue for diabetic eye exam. Pt has been advised about the importance in completing this exam  Diabetic Foot Exam: . Pt has been advised about the importance in completing this exam. .    Community Resource Referral / Chronic Care Management: CRR required this visit?  No   CCM required this visit?  No     Plan:     I have personally reviewed and noted the following in the patient's chart:   Medical and social history Use of alcohol, tobacco  or illicit drugs  Current medications and supplements including opioid prescriptions. Patient is not currently taking opioid prescriptions. Functional ability and status Nutritional status Physical activity Advanced directives List of other physicians Hospitalizations, surgeries, and ER visits in previous 12 months Vitals Screenings to include cognitive, depression, and falls Referrals and appointments  In addition, I have reviewed and discussed with patient certain preventive protocols, quality metrics, and best practice recommendations. A written personalized care plan for preventive services as well as general preventive health recommendations were provided to patient.     Remi Haggard, LPN   0/08/2724   After Visit Summary: (MyChart) Due to this being a telephonic visit, the after visit summary with patients personalized plan was offered to patient via MyChart   Nurse Notes:

## 2023-12-16 NOTE — Patient Instructions (Signed)
Erik Hancock , Thank you for taking time to come for your Medicare Wellness Visit. I appreciate your ongoing commitment to your health goals. Please review the following plan we discussed and let me know if I can assist you in the future.   Screening recommendations/referrals: Colonoscopy: up to date Recommended yearly ophthalmology/optometry visit for glaucoma screening and checkup Recommended yearly dental visit for hygiene and checkup  Vaccinations: Influenza vaccine: up to date Pneumococcal vaccine: up to date Tdap vaccine: up to date Shingles vaccine: up to date    Advanced directives: Education provided    Preventive Care 65 Years and Older, Male Preventive care refers to lifestyle choices and visits with your health care provider that can promote health and wellness. What does preventive care include? A yearly physical exam. This is also called an annual well check. Dental exams once or twice a year. Routine eye exams. Ask your health care provider how often you should have your eyes checked. Personal lifestyle choices, including: Daily care of your teeth and gums. Regular physical activity. Eating a healthy diet. Avoiding tobacco and drug use. Limiting alcohol use. Practicing safe sex. Taking low doses of aspirin every day. Taking vitamin and mineral supplements as recommended by your health care provider. What happens during an annual well check? The services and screenings done by your health care provider during your annual well check will depend on your age, overall health, lifestyle risk factors, and family history of disease. Counseling  Your health care provider may ask you questions about your: Alcohol use. Tobacco use. Drug use. Emotional well-being. Home and relationship well-being. Sexual activity. Eating habits. History of falls. Memory and ability to understand (cognition). Work and work Astronomer. Screening  You may have the following tests or  measurements: Height, weight, and BMI. Blood pressure. Lipid and cholesterol levels. These may be checked every 5 years, or more frequently if you are over 15 years old. Skin check. Lung cancer screening. You may have this screening every year starting at age 72 if you have a 30-pack-year history of smoking and currently smoke or have quit within the past 15 years. Fecal occult blood test (FOBT) of the stool. You may have this test every year starting at age 72. Flexible sigmoidoscopy or colonoscopy. You may have a sigmoidoscopy every 5 years or a colonoscopy every 10 years starting at age 72. Prostate cancer screening. Recommendations will vary depending on your family history and other risks. Hepatitis C blood test. Hepatitis B blood test. Sexually transmitted disease (STD) testing. Diabetes screening. This is done by checking your blood sugar (glucose) after you have not eaten for a while (fasting). You may have this done every 1-3 years. Abdominal aortic aneurysm (AAA) screening. You may need this if you are a current or former smoker. Osteoporosis. You may be screened starting at age 72 if you are at high risk. Talk with your health care provider about your test results, treatment options, and if necessary, the need for more tests. Vaccines  Your health care provider may recommend certain vaccines, such as: Influenza vaccine. This is recommended every year. Tetanus, diphtheria, and acellular pertussis (Tdap, Td) vaccine. You may need a Td booster every 10 years. Zoster vaccine. You may need this after age 72. Pneumococcal 13-valent conjugate (PCV13) vaccine. One dose is recommended after age 72. Pneumococcal polysaccharide (PPSV23) vaccine. One dose is recommended after age 72. Talk to your health care provider about which screenings and vaccines you need and how often you need them.  This information is not intended to replace advice given to you by your health care provider. Make sure  you discuss any questions you have with your health care provider. Document Released: 12/06/2015 Document Revised: 07/29/2016 Document Reviewed: 09/10/2015 Elsevier Interactive Patient Education  2017 [employer].  Fall Prevention in the Home Falls can cause injuries. They can happen to people of all ages. There are many things you can do to make your home safe and to help prevent falls. What can I do on the outside of my home? Regularly fix the edges of walkways and driveways and fix any cracks. Remove anything that might make you trip as you walk through a door, such as a raised step or threshold. Trim any bushes or trees on the path to your home. Use bright outdoor lighting. Clear any walking paths of anything that might make someone trip, such as rocks or tools. Regularly check to see if handrails are loose or broken. Make sure that both sides of any steps have handrails. Any raised decks and porches should have guardrails on the edges. Have any leaves, snow, or ice cleared regularly. Use sand or salt on walking paths during winter. Clean up any spills in your garage right away. This includes oil or grease spills. What can I do in the bathroom? Use night lights. Install grab bars by the toilet and in the tub and shower. Do not use towel bars as grab bars. Use non-skid mats or decals in the tub or shower. If you need to sit down in the shower, use a plastic, non-slip stool. Keep the floor dry. Clean up any water that spills on the floor as soon as it happens. Remove soap buildup in the tub or shower regularly. Attach bath mats securely with double-sided non-slip rug tape. Do not have throw rugs and other things on the floor that can make you trip. What can I do in the bedroom? Use night lights. Make sure that you have a light by your bed that is easy to reach. Do not use any sheets or blankets that are too big for your bed. They should not hang down onto the floor. Have a firm  chair that has side arms. You can use this for support while you get dressed. Do not have throw rugs and other things on the floor that can make you trip. What can I do in the kitchen? Clean up any spills right away. Avoid walking on wet floors. Keep items that you use a lot in easy-to-reach places. If you need to reach something above you, use a strong step stool that has a grab bar. Keep electrical cords out of the way. Do not use floor polish or wax that makes floors slippery. If you must use wax, use non-skid floor wax. Do not have throw rugs and other things on the floor that can make you trip. What can I do with my stairs? Do not leave any items on the stairs. Make sure that there are handrails on both sides of the stairs and use them. Fix handrails that are broken or loose. Make sure that handrails are as long as the stairways. Check any carpeting to make sure that it is firmly attached to the stairs. Fix any carpet that is loose or worn. Avoid having throw rugs at the top or bottom of the stairs. If you do have throw rugs, attach them to the floor with carpet tape. Make sure that you have a light switch at the  top of the stairs and the bottom of the stairs. If you do not have them, ask someone to add them for you. What else can I do to help prevent falls? Wear shoes that: Do not have high heels. Have rubber bottoms. Are comfortable and fit you well. Are closed at the toe. Do not wear sandals. If you use a stepladder: Make sure that it is fully opened. Do not climb a closed stepladder. Make sure that both sides of the stepladder are locked into place. Ask someone to hold it for you, if possible. Clearly mark and make sure that you can see: Any grab bars or handrails. First and last steps. Where the edge of each step is. Use tools that help you move around (mobility aids) if they are needed. These include: Canes. Walkers. Scooters. Crutches. Turn on the lights when you go  into a dark area. Replace any light bulbs as soon as they burn out. Set up your furniture so you have a clear path. Avoid moving your furniture around. If any of your floors are uneven, fix them. If there are any pets around you, be aware of where they are. Review your medicines with your doctor. Some medicines can make you feel dizzy. This can increase your chance of falling. Ask your doctor what other things that you can do to help prevent falls. This information is not intended to replace advice given to you by your health care provider. Make sure you discuss any questions you have with your health care provider. Document Released: 09/05/2009 Document Revised: 04/16/2016 Document Reviewed: 12/14/2014 Elsevier Interactive Patient Education  2017 [employer].

## 2023-12-17 ENCOUNTER — Encounter: Payer: Self-pay | Admitting: Family Medicine

## 2023-12-28 DIAGNOSIS — R21 Rash and other nonspecific skin eruption: Secondary | ICD-10-CM | POA: Diagnosis not present

## 2023-12-29 ENCOUNTER — Other Ambulatory Visit: Payer: Self-pay | Admitting: Internal Medicine

## 2023-12-29 DIAGNOSIS — E1159 Type 2 diabetes mellitus with other circulatory complications: Secondary | ICD-10-CM

## 2024-01-25 ENCOUNTER — Ambulatory Visit: Payer: Medicare PPO | Admitting: Internal Medicine

## 2024-02-07 ENCOUNTER — Ambulatory Visit: Admitting: Internal Medicine

## 2024-02-07 ENCOUNTER — Encounter: Payer: Self-pay | Admitting: Internal Medicine

## 2024-02-07 VITALS — BP 126/64 | HR 66 | Ht 66.0 in | Wt 282.6 lb

## 2024-02-07 DIAGNOSIS — E1159 Type 2 diabetes mellitus with other circulatory complications: Secondary | ICD-10-CM | POA: Diagnosis not present

## 2024-02-07 DIAGNOSIS — Z794 Long term (current) use of insulin: Secondary | ICD-10-CM | POA: Diagnosis not present

## 2024-02-07 DIAGNOSIS — E785 Hyperlipidemia, unspecified: Secondary | ICD-10-CM

## 2024-02-07 DIAGNOSIS — E1165 Type 2 diabetes mellitus with hyperglycemia: Secondary | ICD-10-CM | POA: Diagnosis not present

## 2024-02-07 LAB — POCT GLYCOSYLATED HEMOGLOBIN (HGB A1C): Hemoglobin A1C: 8.2 % — AB (ref 4.0–5.6)

## 2024-02-07 MED ORDER — TRESIBA FLEXTOUCH 200 UNIT/ML ~~LOC~~ SOPN: 30.0000 [IU] | PEN_INJECTOR | Freq: Every day | SUBCUTANEOUS | 3 refills | Status: AC

## 2024-02-07 MED ORDER — ACCU-CHEK AVIVA PLUS VI STRP: ORAL_STRIP | 12 refills | Status: AC

## 2024-02-07 NOTE — Addendum Note (Signed)
 Addended by: Pollie Meyer on: 02/07/2024 09:15 AM   Modules accepted: Orders

## 2024-02-07 NOTE — Patient Instructions (Addendum)
 Please continue: - Ozempic 2 mg weekly  Change - Tresiba 30 units at bedtime - Novolog - 15 min before the meals Smaller meal: 20 units Larger meal: 30 units    Check sugars later in the day, also!  Please schedule an appt with Oran Rein with nutrition.  Try to schedule a new eye exam.  Change: - Alpha lipoic acid 600 mg 2x a day  Please return in 3-4 months.

## 2024-02-07 NOTE — Progress Notes (Signed)
 Patient ID: Erik Hancock, male   DOB: [birth year], 73 y.o.   MRN: [id number]  HPI: Erik Hancock is a 73 y.o.-year-old male, returning for follow-up for DM2, dx in 2002, insulin-dependent since 2010, uncontrolled, with complications (CAD - s/p PTCA, CKD stage IIIa, DR). Pt. previously saw Dr. Everardo All, but last visit with me 4 months ago.  He is with his wife who offers part of the history, especially regarding his medications, past medical history, diet, and activity.  Interim history: No increased urination, blurry vision, nausea, chest pain.  Reviewed HbA1c: 09/24/2023: HbA1c 7.1% Lab Results  Component Value Date   HGBA1C 7.3 (H) 07/09/2023   HGBA1C 7.5 (A) 03/18/2023   HGBA1C 6.6 (H) 05/21/2022   HGBA1C 7.3 (A) 03/30/2022   HGBA1C 8.4 (H) 02/11/2022   HGBA1C 8.0 (A) 08/21/2021   HGBA1C 8.8 (A) 05/20/2021   HGBA1C 9.7 (A) 01/28/2021   HGBA1C 7.6 (A) 06/17/2020   HGBA1C 7.7 (A) 03/11/2020   Previously on: - Victoza 1.8 mg before breakfast - Lantus 24 units at bedtime - Novolog 30 units 2x a day, after breakfast and at bedtime! He was taken off Jardiance in the hospital in 01/2022. He tried Metformin >> stopped.  Currently on: - Tresiba 24 >> 28 >> still using 24 units at bedtime - Novolog 20-30 >> 30 >> 20-30 units 2x daily 15 min before the meals - still using 30 units with every meal - Ozempic 0.5 >> 1 >> 2 mg weekly  Pt. Tried a Libre CGM >> kept coming off.  He still has some at home.  Pt checks his sugars 1-2x a day: - am: 60s, 108-233 >> 165, 280 >> 120-180 >> 98-256 - 2h after b'fast: n/c   - before lunch: n/c - 2h after lunch: n/c >> 150-200s - before dinner: n/c - 2h after dinner: n/c - bedtime: n/c - nighttime: n/c Lowest sugar was 60s >> 63 >> 98; he has hypoglycemia awareness at 70.  Highest sugar was 200s >> 280 >> 336 >> 256  Glucometer:AccuChek  - + CKD stage 3a, last BUN/creatinine:  Lab Results  Component Value Date   BUN 19 08/06/2023   BUN 16  07/28/2023   CREATININE 1.17 08/06/2023   CREATININE 1.10 07/28/2023   Lab Results  Component Value Date   MICRALBCREAT 4 07/09/2023  He is on Diovan 160 mg daily.  -+ HL; last set of lipids: Lab Results  Component Value Date   CHOL 113 07/09/2023   HDL 56 07/09/2023   LDLCALC 37 07/09/2023   TRIG 110 07/09/2023   CHOLHDL 2.0 07/09/2023  On Crestor 10 mg daily.  - last eye exam was on 08/07/2021. + DR reportedly.  Available records are from 2019: + DR at that time. Dr. Ashley Royalty. IO injections.  - no numbness and tingling in his feet.  Last foot exam 07/13/2023. He was in the emergency room in 07/2023 with cellulitis of the right anterior lower leg.  This improved.  He also has a history of HTN, kidney stones, GERD. He had a heart cath in 07/2023: one blockage.  ROS: + see HPI  Past Medical History:  Diagnosis Date   Arthritis    "all over" (07/29/2018)   CAD S/P percutaneous coronary angioplasty    a. stent to mLAD 11/2001. b. DES to PDA/mLCx 2007. c. DES to ostial LAD 07/2018.   Chronic lower back pain    CKD (chronic kidney disease), stage III (HCC)    CKD (chronic  kidney disease), stage III (HCC)    Diabetic retinopathy (HCC)    Diabetic retinopathy (HCC)    GERD (gastroesophageal reflux disease)    High cholesterol    History of kidney stones    Hypertension    Mobitz type 2 second degree atrioventricular block    a. noted during 07/2018 adm, metoprolol discontinued.   Morbid obesity (HCC) 07/28/2018   Prostatitis    Pulmonary nodule 2013   CT   Sleep apnea    stopbang=5   Suspected sleep apnea    Type II diabetes mellitus (HCC)    Past Surgical History:  Procedure Laterality Date   CARDIAC CATHETERIZATION  JUNE 2003   PATENT LAD STENT/ BODERLINE OBSTRUCTIVE DISEASE POSTERIOR DESCENDING ARTERY/ NORMAL LVF   CATARACT EXTRACTION W/ INTRAOCULAR LENS  IMPLANT, BILATERAL Bilateral 2017   CERVICAL SCOVILLE FORAMINOTOMY W/ EXCISION OF HERNIATED NUCLEC PULPOSUS  2009    C6 - 7   CORONARY ANGIOPLASTY WITH STENT PLACEMENT  03/30/2006   DR EDMUNDS - 90% LCx@OM2  TAXUS  DES 3.0 X 20 -> 3.25 mm,   95% RPDA -- TAXUS DES 3.0 X 16 --> 3.5 mm   CORONARY ANGIOPLASTY WITH STENT PLACEMENT  11/2001   (Dr. Ty Hilts for Dr. Mayford Knife) - mLAD@D2  - TAXUS EXPRESS DES 3.5 x 15    CORONARY ANGIOPLASTY WITH STENT PLACEMENT  07/29/2018   CORONARY STENT INTERVENTION N/A 07/29/2018   Procedure: CORONARY STENT INTERVENTION;  Surgeon: Marykay Lex, MD;  Location: [facility];  Service: Cardiovascular;  Laterality: N/A;   CYSTOSCOPY W/ RETROGRADES  05/04/2012   Procedure: CYSTOSCOPY WITH RETROGRADE PYELOGRAM;  Surgeon: Milford Cage, MD;  Location: [facility];  Service: Urology;  Laterality: Left;   CYSTOSCOPY W/ URETERAL STENT PLACEMENT  05/04/2012   Procedure: CYSTOSCOPY WITH STENT REPLACEMENT;  Surgeon: Milford Cage, MD;  Location: [facility];  Service: Urology;  Laterality: Left;   CYSTOSCOPY WITH STENT PLACEMENT Left 04/04/2012   CYSTOSCOPY WITH URETEROSCOPY  05/04/2012   Procedure: CYSTOSCOPY WITH URETEROSCOPY;  Surgeon: Milford Cage, MD;  Location: [facility];  Service: Urology;  Laterality: Left;       I & D EXTREMITY Right 02/11/2022   Procedure: IRRIGATION AND DEBRIDEMENT  OF HAND AND FOREARM;  Surgeon: Bradly Bienenstock, MD;  Location: [facility];  Service: Orthopedics;  Laterality: Right;   LEFT HEART CATH AND CORONARY ANGIOGRAPHY N/A 07/29/2018   Procedure: LEFT HEART CATH AND CORONARY ANGIOGRAPHY;  Surgeon: Marykay Lex, MD;  [facility];  Service: Cardiovascular;  Laterality: N/A;   LEFT URETEROSCOPIC STONE EXTRACTION  03-14-2003   X2   PARTIAL KNEE ARTHROPLASTY Left 06/02/2022   Procedure: UNICOMPARTMENTAL KNEE;  Surgeon: Sheral Apley, MD;  Location: [facility];  Service: Orthopedics;  Laterality: Left;   RIGHT/LEFT HEART CATH AND CORONARY ANGIOGRAPHY N/A 08/11/2023   Procedure: RIGHT/LEFT HEART CATH AND CORONARY ANGIOGRAPHY;  Surgeon:  [name], MD;  Location: [facility];  Service: Cardiovascular;  Laterality: N/A;   Social History   Socioeconomic History   Marital status: Married    Spouse name: Not on file   Number of children: Not on file   Years of education: Not on file   Highest education level: Not on file  Occupational History   Not on file  Tobacco Use   Smoking status: Never   Smokeless tobacco: Never  Vaping Use   Vaping status: Never Used  Substance and Sexual Activity   Alcohol use: Never  Drug use: Never   Sexual activity: Not Currently  Other Topics Concern   Not on file  Social History Narrative   Right handed    Lives with wife    Social Drivers of Health   Financial Resource Strain: Low Risk  (12/16/2023)   Overall Financial Resource Strain (CARDIA)    Difficulty of Paying Living Expenses: Not hard at all  Food Insecurity: No Food Insecurity (12/16/2023)   Hunger Vital Sign    Worried About Running Out of Food in the Last Year: Never true    Ran Out of Food in the Last Year: Never true  Transportation Needs: No Transportation Needs (12/16/2023)   PRAPARE - Administrator, Civil Service (Medical): No    Lack of Transportation (Non-Medical): No  Physical Activity: Inactive (12/16/2023)   Exercise Vital Sign    Days of Exercise per Week: 0 days    Minutes of Exercise per Session: 0 min  Stress: No Stress Concern Present (12/16/2023)   [employer] of Occupational Health - Occupational Stress Questionnaire    Feeling of Stress : Not at all  Social Connections: Moderately Integrated (12/16/2023)   Social Connection and Isolation Panel [NHANES]    Frequency of Communication with Friends and Family: Once a week    Frequency of Social Gatherings with Friends and Family: More than three times a week    Attends Religious Services: More than 4 times per year    Active Member of [employer] or Organizations: No    Attends Banker Meetings: Never     Marital Status: Married  Catering manager Violence: Not At Risk (12/16/2023)   Humiliation, Afraid, Rape, and Kick questionnaire    Fear of Current or Ex-Partner: No    Emotionally Abused: No    Physically Abused: No    Sexually Abused: No   Current Outpatient Medications on File Prior to Visit  Medication Sig Dispense Refill   acetaminophen (TYLENOL) 500 MG tablet Take 2 tablets (1,000 mg total) by mouth every 6 (six) hours as needed for mild pain or moderate pain. 60 tablet 0   aspirin 81 MG tablet Take 81 mg by mouth daily.     BRILINTA 60 MG TABS tablet TAKE ONE TABLET BY MOUTH TWO TIMES A DAY 180 tablet 0   furosemide (LASIX) 20 MG tablet Take 1 tablet (20 mg total) by mouth daily. 90 tablet 2   insulin aspart (NOVOLOG FLEXPEN) 100 UNIT/ML FlexPen Inject 20 to 30 units under the skin twice a day before meals 60 mL 1   Insulin Pen Needle 32G X 4 MM MISC Use 4x a day 300 each 3   isosorbide mononitrate (IMDUR) 30 MG 24 hr tablet Take 1 tablet (30 mg total) by mouth daily. 90 tablet 3   neomycin-bacitracin-polymyxin (NEOSPORIN) OINT Apply 1 Application topically as needed for wound care.     nitroGLYCERIN (NITROLINGUAL) 0.4 MG/SPRAY spray Place 1 spray under the tongue every 5 (five) minutes x 3 doses as needed for chest pain. 25 g 4   pantoprazole (PROTONIX) 40 MG tablet Take 1 tablet (40 mg total) by mouth daily. 90 tablet 1   rosuvastatin (CRESTOR) 10 MG tablet Take 1 tablet (10 mg total) by mouth daily. 90 tablet 3   Semaglutide, 2 MG/DOSE, 8 MG/3ML SOPN Inject 2 mg as directed once a week. 9 mL 3   spironolactone (ALDACTONE) 25 MG tablet Take 1 tablet (25 mg total) by mouth daily. 90 tablet 2  tamsulosin (FLOMAX) 0.4 MG CAPS capsule Take 1 capsule (0.4 mg total) by mouth daily. 30 capsule 0   TRESIBA FLEXTOUCH 200 UNIT/ML FlexTouch Pen INJECT 24 UNITS INTO THE SKIN ONCE DAILY 9 mL 0   valsartan (DIOVAN) 160 MG tablet Take 1 tablet (160 mg total) by mouth daily. 90 tablet 3   No  current facility-administered medications on file prior to visit.   Allergies  Allergen Reactions   Beta Adrenergic Blockers     Metoprolol stopped 07/2018 due to 2nd degree type 2 AV block.   Family History  Problem Relation Age of Onset   Hypertension Mother    Heart attack Father    Heart disease Father    Alzheimer's disease Father    Diabetes Father    Heart attack Paternal Grandfather    Heart disease Paternal Grandfather    Lupus Sister    PE: There were no vitals taken for this visit. Wt Readings from Last 10 Encounters:  11/03/23 285 lb 12.8 oz (129.6 kg)  09/24/23 287 lb 6.4 oz (130.4 kg)  08/11/23 280 lb (127 kg)  07/29/23 286 lb (129.7 kg)  07/28/23 285 lb (129.3 kg)  07/23/23 285 lb (129.3 kg)  07/13/23 286 lb 4.8 oz (129.9 kg)  04/09/23 285 lb 4.8 oz (129.4 kg)  04/06/23 281 lb (127.5 kg)  03/18/23 281 lb 9.6 oz (127.7 kg)   Constitutional: overweight, in NAD Eyes: no exophthalmos ENT: no thyromegaly, no cervical lymphadenopathy Cardiovascular: RRR, No MRG Respiratory: CTA B Musculoskeletal: no deformities Skin: no rashes Neurological: no tremor with outstretched hands  ASSESSMENT: 1. DM2, insulin-dependent, uncontrolled, with complications - CAD, s/p PTCA - CKD stage 3a - DR  2. HL  3.  Obesity class III  PLAN:  1. Patient with longstanding, uncontrolled, type 2 diabetes, on injectable antidiabetic regimen with weekly GLP-1 receptor agonist and basal-bolus insulin regimen, with insulin doses increased at last visit.  At that time, his Ozempic dose was further increased by cardiology, taking the maximum dose of 2 mg weekly.  HbA1c improved to 7.1%, however, sugars were very high in the office, in the 300s after receiving 2 steroid injections the day before.  We discussed about using higher doses of insulin for the next 2 to 3 days and I also advised him to check sugars later in the day as he was only checking in the morning. -At today's visit, sugars  are higher, and are quite variable.  He checks sugars in the morning and after lunch but did not record when the values are obtained.  Sugars are fluctuating mainly between 150 and 220.  He is still using the lower dose of Tresiba, forgot to increase the dose as recommended at last visit.  Also, still only using 30 units of NovoLog before each meal.  I explained why it is important for him to vary the dose based on the size and consistency of the meal.  At today's visit, will increase his Tresiba dose, advised him to also check sugars later in the day, gave him a sugar log, and recommended to schedule an appointment with the nutritionist.  An improvement in diet should greatly help his diabetes control. - I suggested to:  Patient Instructions  Please continue: - Ozempic 2 mg weekly  Change - Tresiba 30 units at bedtime - Novolog - 15 min before the meals Smaller meal: 20 units Larger meal: 30 units    Check sugars later in the day, also!  Please schedule an  appt with Oran Rein with nutrition.  Try to schedule a new eye exam.  Change: - Alpha lipoic acid 600 mg 2x a day  Please return in 3-4 months.  - we checked his HbA1c: 8.2% (higher) - advised to check sugars at different times of the day - 2x a day, rotating check times - advised for yearly eye exams >> he is not UTD - advised to schedule - return to clinic in 3-4 months   2. HL -Latest lipid panel was reviewed from 06/2023: Fractions at goal: Lab Results  Component Value Date   CHOL 113 07/09/2023   HDL 56 07/09/2023   LDLCALC 37 07/09/2023   TRIG 110 07/09/2023   CHOLHDL 2.0 07/09/2023  -Will continue Crestor 10 mg daily.  No side effects  3.  Obesity class III -He gained approximately 25 pounds last year, and he suspected that this may have been due to fluid retention.  Currently on Lasix. -He gained 6 pounds before last visit.  We discussed about not relying only on Ozempic for weight control, to also adjust his  diet  Carlus Pavlov, MD PhD [facility] Endocrinology

## 2024-02-17 ENCOUNTER — Other Ambulatory Visit (HOSPITAL_BASED_OUTPATIENT_CLINIC_OR_DEPARTMENT_OTHER): Payer: Self-pay | Admitting: Family

## 2024-03-28 ENCOUNTER — Other Ambulatory Visit (HOSPITAL_BASED_OUTPATIENT_CLINIC_OR_DEPARTMENT_OTHER)

## 2024-03-31 ENCOUNTER — Ambulatory Visit: Admitting: Dietician

## 2024-04-05 DIAGNOSIS — M25572 Pain in left ankle and joints of left foot: Secondary | ICD-10-CM | POA: Diagnosis not present

## 2024-04-10 ENCOUNTER — Encounter (HOSPITAL_BASED_OUTPATIENT_CLINIC_OR_DEPARTMENT_OTHER): Payer: Self-pay | Admitting: Cardiovascular Disease

## 2024-04-19 ENCOUNTER — Other Ambulatory Visit: Payer: Self-pay | Admitting: Family Medicine

## 2024-04-19 ENCOUNTER — Other Ambulatory Visit (HOSPITAL_BASED_OUTPATIENT_CLINIC_OR_DEPARTMENT_OTHER): Payer: Self-pay | Admitting: Family

## 2024-04-19 DIAGNOSIS — K219 Gastro-esophageal reflux disease without esophagitis: Secondary | ICD-10-CM

## 2024-04-21 ENCOUNTER — Telehealth: Payer: Self-pay | Admitting: Cardiovascular Disease

## 2024-04-21 NOTE — Telephone Encounter (Signed)
 LVMTCB

## 2024-04-21 NOTE — Telephone Encounter (Signed)
 Pt spouse called in to schedule echo. Next available is 6/30 and order expires 6/18. Can this order be extended out please.

## 2024-04-25 ENCOUNTER — Other Ambulatory Visit: Payer: Self-pay | Admitting: Family Medicine

## 2024-04-25 DIAGNOSIS — K219 Gastro-esophageal reflux disease without esophagitis: Secondary | ICD-10-CM

## 2024-04-25 NOTE — Telephone Encounter (Unsigned)
 Copied from CRM [id number]. Topic: Clinical - Medication Refill >> Apr 25, 2024  9:39 AM Ivette P wrote: Medication: pantoprazole  (PROTONIX ) 40 MG tablet  Has the patient contacted their pharmacy? Yes (Agent: If no, request that the patient contact the pharmacy for the refill. If patient does not wish to contact the pharmacy document the reason why and proceed with request.) (Agent: If yes, when and what did the pharmacy advise?)  This is the patient's preferred pharmacy:  [facility] - [place], [place] - [address] [place] [address] Phone: [phone] Fax: [phone]  Is this the correct pharmacy for this prescription? Yes If no, delete pharmacy and type the correct one.   Has the prescription been filled recently? No, 08/17/23  Is the patient out of the medication? Yes  Has the patient been seen for an appointment in the last year OR does the patient have an upcoming appointment? Yes  Can we respond through MyChart? Yes  Agent: Please be advised that Rx refills may take up to 3 business days. We ask that you follow-up with your pharmacy.

## 2024-04-26 MED ORDER — PANTOPRAZOLE SODIUM 40 MG PO TBEC: 40.0000 mg | DELAYED_RELEASE_TABLET | Freq: Every day | ORAL | 0 refills | Status: DC

## 2024-04-26 NOTE — Telephone Encounter (Signed)
 Requested by interface surescripts. Future visit scheduled 12/18/24.  Requested Prescriptions  Pending Prescriptions Disp Refills   pantoprazole  (PROTONIX ) 40 MG tablet 90 tablet 0    Sig: Take 1 tablet (40 mg total) by mouth daily.     Gastroenterology: Proton Pump Inhibitors Passed - 04/26/2024  1:46 PM      Passed - Valid encounter within last 12 months    Recent Outpatient Visits           9 months ago Flank pain   [place] Cheril Cork, Cisco Crest, MD   9 months ago Tick bite, unspecified site, initial encounter   [place] [facility] Austine Lefort, MD   1 year ago Polyarthralgia   [place] [facility] Austine Lefort, MD   1 year ago Cellulitis of left upper extremity   [place] [facility] [name], Cisco Crest, MD       Future Appointments             In 2 months Maudine Sos, MD [facility] at [facility]

## 2024-04-26 NOTE — Telephone Encounter (Signed)
 Called patient to schedule appt for medication refills. Spoke to pt wife Amalia Badder on [place]. Annual physical scheduled earliest available 12/18/24.

## 2024-05-16 ENCOUNTER — Other Ambulatory Visit (HOSPITAL_BASED_OUTPATIENT_CLINIC_OR_DEPARTMENT_OTHER): Payer: Self-pay | Admitting: Cardiovascular Disease

## 2024-05-16 ENCOUNTER — Other Ambulatory Visit (HOSPITAL_BASED_OUTPATIENT_CLINIC_OR_DEPARTMENT_OTHER): Payer: Self-pay | Admitting: Family

## 2024-05-26 DIAGNOSIS — M791 Myalgia, unspecified site: Secondary | ICD-10-CM | POA: Diagnosis not present

## 2024-05-26 DIAGNOSIS — E860 Dehydration: Secondary | ICD-10-CM | POA: Diagnosis not present

## 2024-05-26 DIAGNOSIS — E785 Hyperlipidemia, unspecified: Secondary | ICD-10-CM | POA: Diagnosis not present

## 2024-05-26 DIAGNOSIS — Z1152 Encounter for screening for COVID-19: Secondary | ICD-10-CM | POA: Diagnosis not present

## 2024-05-26 DIAGNOSIS — R509 Fever, unspecified: Secondary | ICD-10-CM | POA: Diagnosis not present

## 2024-05-26 DIAGNOSIS — T148XXA Other injury of unspecified body region, initial encounter: Secondary | ICD-10-CM | POA: Diagnosis not present

## 2024-05-26 DIAGNOSIS — Z955 Presence of coronary angioplasty implant and graft: Secondary | ICD-10-CM | POA: Diagnosis not present

## 2024-05-26 DIAGNOSIS — R059 Cough, unspecified: Secondary | ICD-10-CM | POA: Diagnosis not present

## 2024-05-26 DIAGNOSIS — I10 Essential (primary) hypertension: Secondary | ICD-10-CM | POA: Diagnosis not present

## 2024-05-26 DIAGNOSIS — I2510 Atherosclerotic heart disease of native coronary artery without angina pectoris: Secondary | ICD-10-CM | POA: Diagnosis not present

## 2024-05-26 NOTE — ED Provider Notes (Signed)
 Emergency Department Provider Note   ED Clinical Impression   Final diagnoses:  [M79.10] Myalgia  [E86.0] Dehydration    Initial Impression, ED Course, Assessment and Plan   MDM: 73 y.o. male with history noted below presents with generalized bodyaches.  Please see ED course for MDM.  ED Course as of 05/26/24 2235  Fri May 26, 2024  8730 HPI: 73 year old male presents with generalized bodyaches, fever.  This been ongoing for the last 2 days.  Endorses fever at home 100, has some nausea, no chest pain or shortness of breath.  Recommended by taking the other day, states is around the area but exam. [BB]  2011 Differential includes but is not limited to URI, pneumonia, sepsis, Lyme disease.  Comorbidities affecting current condition hypertension, CAD.  History obtained by patient, wife.  Social determinants of health advanced age.  [BB]  2014 Echo 2024: 1. Left ventricular ejection fraction, by estimation, is 60 to 65%. The left ventricle has normal function. The left ventricle has no regional wall motion abnormalities. There is mild left ventricular hypertrophy. Left ventricular diastolic parameters  are consistent with Grade I diastolic dysfunction (impaired relaxation). The average left ventricular global longitudinal strain is -20.6 %. The global longitudinal strain is normal.   2. Right ventricular systolic function is normal. The right ventricular size is normal.   3. Left atrial size was moderately dilated.   4. Cannot r/o small PFO on limited views provided.   5. Right atrial size was mildly dilated.   6. The mitral valve is abnormal. Trivial mitral valve regurgitation. No evidence of mitral stenosis.   7. The aortic valve is tricuspid. There is mild calcification of the aortic valve. There is mild thickening of the aortic valve. Aortic valve regurgitation is trivial. Aortic valve sclerosis is present, with no evidence of aortic valve stenosis.   8. Aortic dilatation noted. There is  mild dilatation of the ascending aorta, measuring 41 mm.   9. The inferior vena cava is normal in size with greater than 50% respiratory variability, suggesting right atrial pressure of 3 mmHg.   [BB]  2023 EKG reviewed, appears to have a first-degree AV block with some ectopy, in lead V3 there appears to be a P wave in the first week, and now second-degree the P wave is abutting the T wave.  The axis is normal, other than the PR interval also normal no ST segment changes concerning for acute ischemia.  No comparison immediately available [BB]  2027 Cardiology note from 11/03/2023 reviewed, has had multiple PCI's, hypertension, hyperlipidemia no DVT, has a history of first-degree AV block, he has normal ejection fraction with grade 1 diastolic dysfunction on most recent echocardiogram has chronic lower extremity edema is on Lasix  and spironolactone  for this, is felt that this is likely venous insufficiency.  His first-degree AV block is known, and monitored by cardiology. [BB]  2029 Last cardiac cath 08/11/2023, had a chronic total occlusion of ostial to proximal right coronary which is collateralized, patent proximal to mid LAD stents and left circumflex stent, no PCI was performed at that time. [BB]  2048  Apparently on 07/13/2023 was tested for Lyme disease had reactive IgG Lyme disease 58 Kd CMP from similar did show similar creatinine to today. [BB]  2101 Comprehensive metabolic panel(!) CMP with mild hypokalemia, stable chronic kidney disease [BB]  2223 Urinalysis with reflex microscopic(!) Concentrated no evidence of infection [BB]  2232 Patient reevaluated, feels much better. [BB]  932 MDM: 73 year old male presents  with generalized bodyaches, fever for last 2 days.  Well-appearing with stable vitals, afebrile on arrival slightly tachycardic this improved with fluids, Tylenol .  His laboratory workup was largely reassuring with the exception of a slight leukocytosis.  Symptoms sound viral in  nature, however given reported tick bite, doxycycline  was given.  On chart review is positive for 1 IgG in the past, was never treated given that there was only 1 IgG positive.Per the result explanation, per CDC criteria IgG immunoblot must show reactivity to at least 5 of 10 specific proteins to be considered positive, Lyme serology with reflex was drawn today, however this will not likely result for some time.  Patient was given fluids, electrolytes were replaced, Tylenol  was given as well and he felt much better afterwards.  Symptoms likely sound viral in nature, however will prescribe doxycycline , Zofran . [BB]    ED Course User Index [BB] Adine CHRISTELLA Lay, DO      Clinical Impressions as of 05/26/24 2235  Myalgia  Dehydration     Any labs and radiology results that were available during my care of the patient were independently reviewed by me and considered in my medical decision making.  Portions of this record have been created using Scientist, clinical (histocompatibility and immunogenetics). Dictation errors have been sought, but may not have been identified and corrected.  History   Chief Complaint Generalized Body Aches (Patient reports that when he lays down his body is aching. Reports it started yesterday after working outside in the heat. ), Nausea, and Insect Bite (Reports that he got bit by a tick about a week or so ago. )   HPI  Erik Hancock is a 73 y.o. male who presents complaining of generalized body aches.  Please see ED course for HPI  Review of systems with pertinent positives and negatives represented in HPI.    Past Medical History:  Diagnosis Date  . Diabetes mellitus (CMS/HCC)   . Hypertension     History reviewed. No pertinent surgical history.  Medications No current facility-administered medications on file prior to encounter.   Current Outpatient Medications on File Prior to Encounter  Medication Sig  . ACCU-CHEK AVIVA PLUS TEST STRP test strip as needed.  . isosorbide   mononitrate (IMDUR ) 30 mg 24 hr tablet Take ONE tablet by mouth 1 (one) time each day.  . nitroglycerin  (NITROLINGUAL ) 400 mcg/spray spray Place ONE spray under the tongue every 5 (five) minutes as needed for chest pain.  . NovoLOG  Flexpen U-100 Insulin  100 unit/mL (3 mL) injection 3 (three) times a day before meals.  . pantoprazole  (PROTONIX ) 40 mg EC tablet Take ONE tablet by mouth 1 (one) time each day.  . rosuvastatin  (CRESTOR ) 10 mg tablet Take ONE tablet by mouth 1 (one) time each day.  . spironolactone  (ALDACTONE ) 25 mg tablet Take ONE tablet by mouth 1 (one) time each day.  . TRESIBA  FLEXTOUCH U-200 200 unit/mL (3 mL) injection Inject THIRTY Units under the skin 1 (one) time each day.  . valsartan  (DIOVAN ) 160 mg tablet Take ONE tablet by mouth 1 (one) time each day.  . aspirin  81 mg chewable tablet Chew ONE tablet 1 (one) time each day.  . BRILINTA  60 mg tablet Take ONE tablet by mouth twice a day.  . furosemide  (LASIX ) 20 mg tablet Take ONE tablet by mouth 1 (one) time each day.  . neomycin-bacitracnZn-polymyxnB 3.5-500-10,000 mg-unit-unit ointment Apply 1 Application topically as needed.  . OZEMPIC  2 mg/dose (8 mg/3 mL) Inject TWO mg under the  skin once per week.    Allergies Patient has no known allergies.  Family History No family history on file.  Social History Social History   Tobacco Use  . Smoking status: Never    Passive exposure: Never  . Smokeless tobacco: Never  Substance Use Topics  . Alcohol use: Never  . Drug use: Never    Past medical, family, and social histories reviewed and verified by me.  Physical Exam   VITAL SIGNS:   ED Triage Vitals [05/26/24 2001]  Temp Heart Rate Resp BP  37.2 C (99 F) 107 20 128/75    SpO2 Temp Source Heart Rate Source Patient Position  96 % Oral Monitor Sitting    BP Location FiO2 (%)    Right arm --     Reviewed vital signs and nursing note as charted by RN/Triage.  Physical Exam Vitals and nursing note  reviewed.  Constitutional:      General: He is not in acute distress. HENT:     Head: Normocephalic and atraumatic.     Right Ear: External ear normal.     Left Ear: External ear normal.     Nose: Nose normal.     Mouth/Throat:     Pharynx: Oropharynx is clear.   Eyes:     Conjunctiva/sclera: Conjunctivae normal.   Pulmonary:     Effort: Pulmonary effort is normal. No respiratory distress.  Abdominal:     General: Abdomen is flat. There is no distension.   Musculoskeletal:        General: No deformity or signs of injury.   Skin:    General: Skin is warm and dry.   Neurological:     Mental Status: He is alert. Mental status is at baseline.   Psychiatric:        Mood and Affect: Mood normal.        Pertinent labs & imaging results that were available during my care of the patient were reviewed by me and considered in my medical decision making (see chart for details).  This note was transcribed using Dragon voice recognition software, and may contain inadvertent misspellings or incorrect transcriptions      Adine CHRISTELLA Lay, DO 05/26/24 2235

## 2024-05-29 ENCOUNTER — Ambulatory Visit (HOSPITAL_BASED_OUTPATIENT_CLINIC_OR_DEPARTMENT_OTHER)

## 2024-05-29 ENCOUNTER — Telehealth (HOSPITAL_BASED_OUTPATIENT_CLINIC_OR_DEPARTMENT_OTHER): Payer: Self-pay | Admitting: Cardiovascular Disease

## 2024-05-29 ENCOUNTER — Encounter: Payer: Self-pay | Admitting: Family Medicine

## 2024-05-29 DIAGNOSIS — I77810 Thoracic aortic ectasia: Secondary | ICD-10-CM | POA: Diagnosis not present

## 2024-05-29 DIAGNOSIS — I080 Rheumatic disorders of both mitral and aortic valves: Secondary | ICD-10-CM | POA: Diagnosis not present

## 2024-05-29 DIAGNOSIS — I441 Atrioventricular block, second degree: Secondary | ICD-10-CM

## 2024-05-29 DIAGNOSIS — I517 Cardiomegaly: Secondary | ICD-10-CM | POA: Diagnosis not present

## 2024-05-29 LAB — ECHOCARDIOGRAM COMPLETE
Area-P 1/2: 7.16 cm2
S' Lateral: 3.24 cm

## 2024-05-29 NOTE — Telephone Encounter (Signed)
 Reviewed chart, ED visit for generalized body aches after tick bite. No Cardiac related symptoms- patient ED note states to contact primary care for further workup. Patient being seen today in clinic for ECHO. Discussed with Marty, sonographer- will advise pt to contact primary care.

## 2024-05-29 NOTE — Telephone Encounter (Signed)
 Pt. Scheduled at 4 pm on 05/30/2024

## 2024-05-29 NOTE — Telephone Encounter (Signed)
 Pt went to emergency room Friday, please call back. Patients wife has several questions, calcium  and potassium is low, blood count is high. Hospital advised to see medical doctor when got back into town. Maybe needs future bloodwork?

## 2024-05-30 ENCOUNTER — Ambulatory Visit: Admitting: Family Medicine

## 2024-05-30 ENCOUNTER — Encounter: Payer: Self-pay | Admitting: Family Medicine

## 2024-05-30 VITALS — BP 132/78 | HR 68 | Temp 99.2°F | Ht 66.0 in | Wt 288.0 lb

## 2024-05-30 DIAGNOSIS — R1084 Generalized abdominal pain: Secondary | ICD-10-CM

## 2024-05-30 DIAGNOSIS — R790 Abnormal level of blood mineral: Secondary | ICD-10-CM | POA: Diagnosis not present

## 2024-05-30 MED ORDER — AMOXICILLIN-POT CLAVULANATE 875-125 MG PO TABS: 1.0000 | ORAL_TABLET | Freq: Two times a day (BID) | ORAL | 0 refills | Status: DC

## 2024-05-30 NOTE — Progress Notes (Signed)
 Subjective:    Patient ID: Erik Hancock, male    DOB: 1951/02/16, 73 y.o.   MRN: [id number]  [place], diffuse weakness, abdominal pain, nausea and vomiting.  Urinalysis was unremarkable except for dehydration.  Creatinine was 1.3.  White blood cell count was 13.  Lyme titers were negative.  Chest x-ray was negative.  Patient was started on doxycycline  for possible tick illness.  Flu test, COVID test, and RSV were negative.  Patient continues to have daily nausea and vomiting.  He also has tenderness palpation in the lower abdomen particularly left lower quadrant.  He denies any diarrhea.  He denies any sick contacts.  He denies any dysuria. Past Medical History:  Diagnosis Date   Arthritis    all over (07/29/2018)   CAD S/P percutaneous coronary angioplasty    a. stent to mLAD 11/2001. b. DES to PDA/mLCx 2007. c. DES to ostial LAD 07/2018.   Chronic lower back pain    CKD (chronic kidney disease), stage III (HCC)    CKD (chronic kidney disease), stage III (HCC)    Diabetic retinopathy (HCC)    Diabetic retinopathy (HCC)    GERD (gastroesophageal reflux disease)    High cholesterol    History of kidney stones    Hypertension    Mobitz type 2 second degree atrioventricular block    a. noted during 07/2018 adm, metoprolol  discontinued.   Morbid obesity (HCC) 07/28/2018   Prostatitis    Pulmonary nodule 2013   CT   Sleep apnea    stopbang=5   Suspected sleep apnea    Type II diabetes mellitus (HCC)    Past Surgical History:  Procedure Laterality Date   CARDIAC CATHETERIZATION  JUNE 2003   PATENT LAD STENT/ BODERLINE OBSTRUCTIVE DISEASE POSTERIOR DESCENDING ARTERY/ NORMAL LVF   CATARACT EXTRACTION W/ INTRAOCULAR LENS  IMPLANT, BILATERAL Bilateral 2017   CERVICAL SCOVILLE FORAMINOTOMY W/ EXCISION OF HERNIATED NUCLEC PULPOSUS  2009   C6 - 7   CORONARY ANGIOPLASTY WITH STENT PLACEMENT  03/30/2006   DR EDMUNDS -  90% LCx@OM2  TAXUS  DES 3.0 X 20 -> 3.25 mm,   95% RPDA -- TAXUS DES 3.0 X 16 --> 3.5 mm   CORONARY ANGIOPLASTY WITH STENT PLACEMENT  11/2001   (Dr. Annis for Dr. Shlomo) - mLAD@D2  - TAXUS EXPRESS DES 3.5 x 15    CORONARY ANGIOPLASTY WITH STENT PLACEMENT  07/29/2018   CORONARY STENT INTERVENTION N/A 07/29/2018   Procedure: CORONARY STENT INTERVENTION;  Surgeon: Anner Alm ORN, MD;  Location: [facility];  Service: Cardiovascular;  Laterality: N/A;   CYSTOSCOPY W/ RETROGRADES  05/04/2012   Procedure: CYSTOSCOPY WITH RETROGRADE PYELOGRAM;  Surgeon: Toribio Neysa Repine, MD;  Location: [facility];  Service: Urology;  Laterality: Left;   CYSTOSCOPY W/ URETERAL STENT PLACEMENT  05/04/2012   Procedure: CYSTOSCOPY WITH STENT REPLACEMENT;  Surgeon: Toribio Neysa Repine, MD;  Location: [facility];  Service: Urology;  Laterality: Left;   CYSTOSCOPY WITH STENT PLACEMENT Left 04/04/2012   CYSTOSCOPY WITH URETEROSCOPY  05/04/2012   Procedure: CYSTOSCOPY WITH URETEROSCOPY;  Surgeon: Toribio Neysa Repine, MD;  Location: [facility];  Service: Urology;  Laterality: Left;       I & D EXTREMITY Right 02/11/2022   Procedure: IRRIGATION AND DEBRIDEMENT  OF HAND AND FOREARM;  Surgeon: Shari Easter, MD;  Location: [facility];  Service: Orthopedics;  Laterality: Right;   LEFT HEART  CATH AND CORONARY ANGIOGRAPHY N/A 07/29/2018   Procedure: LEFT HEART CATH AND CORONARY ANGIOGRAPHY;  Surgeon: Anner Alm ORN, MD;  Location: [facility] CV LAB;  Service: Cardiovascular;  Laterality: N/A;   LEFT URETEROSCOPIC STONE EXTRACTION  03-14-2003   X2   PARTIAL KNEE ARTHROPLASTY Left 06/02/2022   Procedure: UNICOMPARTMENTAL KNEE;  Surgeon: Beverley Evalene BIRCH, MD;  Location: [facility];  Service: Orthopedics;  Laterality: Left;   RIGHT/LEFT HEART CATH AND CORONARY ANGIOGRAPHY N/A 08/11/2023   Procedure: RIGHT/LEFT HEART CATH AND CORONARY ANGIOGRAPHY;  Surgeon: Wendel Lurena POUR, MD;  Location: [facility];  Service: Cardiovascular;   Laterality: N/A;   Current Outpatient Medications on File Prior to Visit  Medication Sig Dispense Refill   acetaminophen  (TYLENOL ) 500 MG tablet Take 2 tablets (1,000 mg total) by mouth every 6 (six) hours as needed for mild pain or moderate pain. 60 tablet 0   aspirin  81 MG tablet Take 81 mg by mouth daily.     doxycycline  (VIBRAMYCIN ) 100 MG capsule Take 100 mg by mouth 2 (two) times daily.     furosemide  (LASIX ) 20 MG tablet Take 1 tablet (20 mg total) by mouth daily. 90 tablet 2   glucose blood (ACCU-CHEK AVIVA PLUS) test strip Use as instructed 2x a day 200 each 12   insulin  aspart (NOVOLOG  FLEXPEN) 100 UNIT/ML FlexPen Inject 20 to 30 units under the skin twice a day before meals 60 mL 1   insulin  degludec (TRESIBA  FLEXTOUCH) 200 UNIT/ML FlexTouch Pen Inject 30 Units into the skin daily. 18 mL 3   Insulin  Pen Needle 32G X 4 MM MISC Use 4x a day 300 each 3   isosorbide  mononitrate (IMDUR ) 30 MG 24 hr tablet TAKE ONE TABLET BY MOUTH ONCE DAILY 90 tablet 2   MAGNESIUM PO Take by mouth.     neomycin-bacitracin-polymyxin (NEOSPORIN) OINT Apply 1 Application topically as needed for wound care.     nitroGLYCERIN  (NITROLINGUAL ) 0.4 MG/SPRAY spray Place 1 spray under the tongue every 5 (five) minutes x 3 doses as needed for chest pain. 25 g 4   ondansetron  (ZOFRAN -ODT) 4 MG disintegrating tablet Take 4 mg by mouth every 8 (eight) hours as needed.     pantoprazole  (PROTONIX ) 40 MG tablet Take 1 tablet (40 mg total) by mouth daily. 90 tablet 0   POTASSIUM PO Take by mouth.     rosuvastatin  (CRESTOR ) 10 MG tablet TAKE ONE TABLET BY MOUTH ONCE DAILY 90 tablet 2   Semaglutide , 2 MG/DOSE, 8 MG/3ML SOPN Inject 2 mg as directed once a week. 9 mL 3   spironolactone  (ALDACTONE ) 25 MG tablet TAKE ONE TABLET (25 MG TOTAL) BY MOUTH DAILY. 90 tablet 1   tamsulosin  (FLOMAX ) 0.4 MG CAPS capsule Take 1 capsule (0.4 mg total) by mouth daily. 30 capsule 0   ticagrelor  (BRILINTA ) 60 MG TABS tablet TAKE ONE TABLET BY  MOUTH TWO TIMES A DAY 180 tablet 2   valsartan  (DIOVAN ) 160 MG tablet Take 1 tablet (160 mg total) by mouth daily. 90 tablet 1   No current facility-administered medications on file prior to visit.   Allergies  Allergen Reactions   Beta Adrenergic Blockers     Metoprolol  stopped 07/2018 due to 2nd degree type 2 AV block.   Social History   Socioeconomic History   Marital status: Married    Spouse name: Not on file   Number of children: Not on file   Years of education: Not on file   Highest  education level: Not on file  Occupational History   Not on file  Tobacco Use   Smoking status: Never   Smokeless tobacco: Never  Vaping Use   Vaping status: Never Used  Substance and Sexual Activity   Alcohol use: Never   Drug use: Never   Sexual activity: Not Currently  Other Topics Concern   Not on file  Social History Narrative   Right handed    Lives with wife    Social Drivers of Health   Financial Resource Strain: Low Risk  (12/16/2023)   Overall Financial Resource Strain (CARDIA)    Difficulty of Paying Living Expenses: Not hard at all  Food Insecurity: No Food Insecurity (12/16/2023)   Hunger Vital Sign    Worried About Running Out of Food in the Last Year: Never true    Ran Out of Food in the Last Year: Never true  Transportation Needs: No Transportation Needs (12/16/2023)   PRAPARE - Administrator, Civil Service (Medical): No    Lack of Transportation (Non-Medical): No  Physical Activity: Inactive (12/16/2023)   Exercise Vital Sign    Days of Exercise per Week: 0 days    Minutes of Exercise per Session: 0 min  Stress: No Stress Concern Present (12/16/2023)   [employer] of Occupational Health - Occupational Stress Questionnaire    Feeling of Stress : Not at all  Social Connections: Moderately Integrated (12/16/2023)   Social Connection and Isolation Panel    Frequency of Communication with Friends and Family: Once a week    Frequency of Social  Gatherings with Friends and Family: More than three times a week    Attends Religious Services: More than 4 times per year    Active Member of [employer] or Organizations: No    Attends Banker Meetings: Never    Marital Status: Married  Catering manager Violence: Not At Risk (12/16/2023)   Humiliation, Afraid, Rape, and Kick questionnaire    Fear of Current or Ex-Partner: No    Emotionally Abused: No    Physically Abused: No    Sexually Abused: No      Review of Systems  All other systems reviewed and are negative.      Objective:   Physical Exam Vitals reviewed.  Constitutional:      General: He is not in acute distress.    Appearance: He is well-developed. He is obese. He is not ill-appearing, toxic-appearing or diaphoretic.  HENT:     Head: Normocephalic and atraumatic.  Neck:     Thyroid : No thyromegaly.     Vascular: No JVD.  Cardiovascular:     Rate and Rhythm: Normal rate and regular rhythm.     Heart sounds: Normal heart sounds. No murmur heard. Pulmonary:     Effort: Pulmonary effort is normal. No respiratory distress.     Breath sounds: Normal breath sounds. No wheezing or rales.  Chest:     Chest wall: No tenderness.  Abdominal:     General: Bowel sounds are normal. There is no distension.     Palpations: Abdomen is soft. There is no mass.     Tenderness: There is abdominal tenderness. There is no guarding or rebound.   Musculoskeletal:     Cervical back: Neck supple.  Lymphadenopathy:     Cervical: No cervical adenopathy.  Skin:    Findings: No erythema or rash.  Neurological:     General: No focal deficit present.     Mental  Status: He is alert and oriented to person, place, and time. Mental status is at baseline.     Cranial Nerves: No cranial nerve deficit.     Sensory: No sensory deficit.     Motor: No weakness.     Gait: Gait normal.     Deep Tendon Reflexes: Reflexes normal.  Psychiatric:        Mood and Affect: Mood normal.         Behavior: Behavior normal.        Thought Content: Thought content normal.        Judgment: Judgment normal.       Assessment & Plan:   Generalized abdominal pain - Plan: CBC with Differential/Platelet, Comprehensive metabolic panel with GFR, Lipase, Magnesium Discontinue Ozempic  due to nausea.  Discontinue furosemide  until the patient is eating and drinking better.  Check CBC CMP and lipase.  Repeat potassium and magnesium which were both low at the outside facility.  Empirically I will start the patient on Augmentin  875 mg twice daily for 10 days for possible diverticulitis.  Seek medical attention immediately if worsening

## 2024-05-31 LAB — CBC WITH DIFFERENTIAL/PLATELET
Absolute Lymphocytes: 1425 {cells}/uL (ref 850–3900)
Absolute Monocytes: 741 {cells}/uL (ref 200–950)
Basophils Absolute: 29 {cells}/uL (ref 0–200)
Basophils Relative: 0.3 %
Eosinophils Absolute: 105 {cells}/uL (ref 15–500)
Eosinophils Relative: 1.1 %
HCT: 38.8 % (ref 38.5–50.0)
Hemoglobin: 12.6 g/dL — ABNORMAL LOW (ref 13.2–17.1)
MCH: 29.4 pg (ref 27.0–33.0)
MCHC: 32.5 g/dL (ref 32.0–36.0)
MCV: 90.7 fL (ref 80.0–100.0)
MPV: 9.2 fL (ref 7.5–12.5)
Monocytes Relative: 7.8 %
Neutro Abs: 7201 {cells}/uL (ref 1500–7800)
Neutrophils Relative %: 75.8 %
Platelets: 366 Thousand/uL (ref 140–400)
RBC: 4.28 Million/uL (ref 4.20–5.80)
RDW: 13.2 % (ref 11.0–15.0)
Total Lymphocyte: 15 %
WBC: 9.5 Thousand/uL (ref 3.8–10.8)

## 2024-05-31 LAB — COMPREHENSIVE METABOLIC PANEL WITH GFR
AG Ratio: 1.3 (calc) (ref 1.0–2.5)
ALT: 17 U/L (ref 9–46)
AST: 18 U/L (ref 10–35)
Albumin: 3.7 g/dL (ref 3.6–5.1)
Alkaline phosphatase (APISO): 79 U/L (ref 35–144)
BUN: 14 mg/dL (ref 7–25)
CO2: 24 mmol/L (ref 20–32)
Calcium: 9.2 mg/dL (ref 8.6–10.3)
Chloride: 106 mmol/L (ref 98–110)
Creat: 1.1 mg/dL (ref 0.70–1.28)
Globulin: 2.9 g/dL (ref 1.9–3.7)
Glucose, Bld: 96 mg/dL (ref 65–99)
Potassium: 4.2 mmol/L (ref 3.5–5.3)
Sodium: 142 mmol/L (ref 135–146)
Total Bilirubin: 0.6 mg/dL (ref 0.2–1.2)
Total Protein: 6.6 g/dL (ref 6.1–8.1)
eGFR: 71 mL/min/1.73m2 (ref >=60)

## 2024-05-31 LAB — LIPASE: Lipase: 12 U/L (ref 7–60)

## 2024-05-31 LAB — MAGNESIUM: Magnesium: 1.9 mg/dL (ref 1.5–2.5)

## 2024-06-01 ENCOUNTER — Ambulatory Visit: Payer: Self-pay | Admitting: Family Medicine

## 2024-06-06 ENCOUNTER — Ambulatory Visit: Payer: Self-pay | Admitting: Cardiovascular Disease

## 2024-06-08 ENCOUNTER — Ambulatory Visit: Admitting: Internal Medicine

## 2024-06-08 ENCOUNTER — Encounter (HOSPITAL_BASED_OUTPATIENT_CLINIC_OR_DEPARTMENT_OTHER): Payer: Self-pay | Admitting: Cardiovascular Disease

## 2024-06-13 ENCOUNTER — Encounter: Payer: Self-pay | Admitting: *Deleted

## 2024-06-15 ENCOUNTER — Encounter (HOSPITAL_BASED_OUTPATIENT_CLINIC_OR_DEPARTMENT_OTHER): Payer: Self-pay | Admitting: Cardiovascular Disease

## 2024-06-15 ENCOUNTER — Ambulatory Visit (HOSPITAL_BASED_OUTPATIENT_CLINIC_OR_DEPARTMENT_OTHER): Admitting: Cardiovascular Disease

## 2024-06-15 VITALS — BP 120/60 | HR 65 | Ht 66.0 in | Wt 283.0 lb

## 2024-06-15 DIAGNOSIS — R0602 Shortness of breath: Secondary | ICD-10-CM | POA: Diagnosis not present

## 2024-06-15 DIAGNOSIS — E1159 Type 2 diabetes mellitus with other circulatory complications: Secondary | ICD-10-CM | POA: Diagnosis not present

## 2024-06-15 DIAGNOSIS — G4733 Obstructive sleep apnea (adult) (pediatric): Secondary | ICD-10-CM | POA: Diagnosis not present

## 2024-06-15 DIAGNOSIS — E7800 Pure hypercholesterolemia, unspecified: Secondary | ICD-10-CM

## 2024-06-15 DIAGNOSIS — E1165 Type 2 diabetes mellitus with hyperglycemia: Secondary | ICD-10-CM

## 2024-06-15 DIAGNOSIS — I2510 Atherosclerotic heart disease of native coronary artery without angina pectoris: Secondary | ICD-10-CM | POA: Diagnosis not present

## 2024-06-15 DIAGNOSIS — N1831 Chronic kidney disease, stage 3a: Secondary | ICD-10-CM

## 2024-06-15 DIAGNOSIS — I441 Atrioventricular block, second degree: Secondary | ICD-10-CM

## 2024-06-15 DIAGNOSIS — I10 Essential (primary) hypertension: Secondary | ICD-10-CM | POA: Diagnosis not present

## 2024-06-15 DIAGNOSIS — I209 Angina pectoris, unspecified: Secondary | ICD-10-CM

## 2024-06-15 MED ORDER — EMPAGLIFLOZIN 10 MG PO TABS: 10.0000 mg | ORAL_TABLET | Freq: Every day | ORAL | 3 refills | Status: AC

## 2024-06-15 NOTE — Patient Instructions (Signed)
 Medication Instructions:  START Jardiance  10 mg take one tablet by mouth daily   *If you need a refill on your cardiac medications before your next appointment, please call your pharmacy*  Lab Work: Lipid panel  Cmp   If you have labs (blood work) drawn today and your tests are completely normal, you will receive your results only by: MyChart Message (if you have MyChart) OR A paper copy in the mail If you have any lab test that is abnormal or we need to change your treatment, we will call you to review the results.  Testing/Procedures: PFT   Your physician has recommended that you have a pulmonary function test. Pulmonary Function Tests are a group of tests that measure how well air moves in and out of your lungs.   Follow-Up: At [facility], you and your health needs are our priority.  As part of our continuing mission to provide you with exceptional heart care, our providers are all part of one team.  This team includes your primary Cardiologist (physician) and Advanced Practice Providers or APPs (Physician Assistants and Nurse Practitioners) who all work together to provide you with the care you need, when you need it.  Your next appointment:   4 month(s)  Provider:   Annabella Scarce, MD

## 2024-06-15 NOTE — Progress Notes (Signed)
 Cardiology Office Note:  .   Date:  06/15/2024  ID:  STEVIN BIELINSKI, DOB 1951/06/19, MRN [id number] PCP: Duanne Butler DASEN, MD  [facility] Providers Cardiologist:  Annabella Scarce, MD    History of Present Illness: .    Erik Hancock is a 73 y.o. male with CAD s/p PCI, mild descending aortic aneurysm, OSA on CPAP, diabetes, hypertension and hyperlipidemia here for follow-up.  He initially established care 07/2018.  At that appointment he reported increasing exertional dyspnea.  Symptoms were progressive over the preceding year.  He had no chest pain but did get discomfort in his left arm.  This was similar to when he previously required coronary stenting.  In 2012 Mr. Ragone had angina and underwent PCI of the LAD.  He had no other CAD.   He had an echo 01/2014 that revealed LVEF 60-65% and an moderately dilated RV.  He was referred for left heart catheterization.  At that time he was found to have three-vessel CAD with stents in LAD, left circumflex, and PDA.  He was noted to have a 95% ostial LAD lesion that was successfully treated with scoring balloon angioplasty and implantation of an Orsiro 3.5x13 drug-eluting stent.  Mr. Bolander had a sleep study that was positive.  It was felt that he would benefit more from a BiPAP but he declined.    Mr. Yohannes followed up with Herlene Canterbury, PA on 02/2020 and reported occasional atypical chest pain.  Ticagrelor  was reduced to 60 mg twice daily. At his appointment on 08/2021, he continued to report atypical chest pain. He underwent a nuclear stress test 09/2021 that was low risk with LVEF 56%. Echo performed 09/2021 revealed grade 1 diastolic dysfunction and his ascending aorta was 3.8cm. He had a home sleep study and CPAP was recommended.    He was started on valsartan  due to poorly controlled blood pressures. He had exertional dyspnea that was thought to be due to obesity and deconditioning. We encouraged exercise and weight loss. He had ABIs 01/2022  and his arteries were non-compressible. He also had an echo at that time with LVEF 60-65% and moderate LVH. Ascending aorta was 3.8 cm and RA pressure was 8 mmHg. He had a left knee replacement 05/2022.   Blood pressure remained uncontrolled at his visit 06/2022.  He was encouraged to start his Ozempic  for both diabetes and weight loss.  Spironolactone  was added to his regimen.  He is on able to tolerate nodal agents due to beta-blockers or thiazide diuretics due to hypokalemia.  Given his ankle edema, amlodipine  was not added.   Mr. Kronberg reports experiencing shortness of breath at times, which has been ongoing for over a year and has remained stable. He also mentions having gained weight since his knee was replaced. The patient has a history of arthritis in their hands and a knee replacement that continues to cause pain and weakness in the affected leg.   The patient describes an episode of chest pain that occurred a month ago, characterized as an aching pain that did not last long and was not accompanied by shortness of breath.  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 The patient has noticed swelling in his legs and feet and experiences shortness of breath when lying in bed at night, which has persisted for over a year. They report poor sleep quality but do not wake up gasping for air and do not use a  CPAP machine. The patient consumes minimal caffeine, no alcohol, and occasionally checks their blood pressure at home.   The patient is currently taking Ozempic  at a dose of 0.5 milligrams once a week, which he reports causes increased urination.  However he continues to gain weight. Mr. Lautner has been doing well since his last appointment.  His only complaint is that he gets short of breath when he is walking in hot weather.  He has to stop and slow down.  He does not have these issues when he is inside or when the weather is cooler.  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  He also notes some mild swelling in his right leg at times.  He denies orthopnea or PND.  He is using his CPAP.  He denies any chest pain or pressure.  Repeat echocardiogram 05/2024 revealed LVEF 60-65% with mild LVH.  There were episodes of Mobitz 2 AV block noted.  Ascending aorta was 3.9 cm.  Right atrial pressure was 3 mmHg.  He denies any smoking history.  He does state his blood pressure has been well-controlled at home.  ROS:  As per HPI  Studies Reviewed: .       Echo 05/29/24: 1. Left ventricular ejection fraction, by estimation, is 60 to 65%. The  left ventricle has normal function. The left ventricle has no regional  wall motion abnormalities. There is mild left ventricular hypertrophy.  Mobitz 2 AV block.   2. Right ventricular systolic function is normal. The right ventricular  size is normal. Tricuspid regurgitation signal is inadequate for assessing  PA pressure.   3. Left atrial size was moderately dilated.   4. Right atrial size was moderately dilated.   5. The mitral valve is degenerative. Trivial mitral valve regurgitation.   6. The aortic valve is tricuspid. Aortic valve regurgitation is not  visualized. Aortic valve sclerosis is present, with no evidence of aortic  valve stenosis.   7. Aortic dilatation noted. There is borderline dilatation of the  ascending aorta, measuring 39 mm.   8. The inferior vena cava is normal in size with greater than 50%  respiratory variability, suggesting right atrial pressure of 3 mmHg.   9. Cannot exclude a small PFO   Risk Assessment/Calculations:         STOP-Bang Score:         Physical Exam:   VS:  BP 120/60 (BP Location: Left Arm, Patient Position: Sitting, Cuff Size: Normal)   Pulse 65   Ht 5' 6 (1.676 m)   Wt 283 lb (128.4 kg)   SpO2 96%   BMI 45.68 kg/m  , BMI Body mass index is 45.68 kg/m. GENERAL:  Well appearing HEENT: Pupils equal round and reactive, fundi not visualized, oral mucosa  unremarkable NECK:  No jugular venous distention, waveform within normal limits, carotid upstroke brisk and symmetric, no bruits, no thyromegaly LUNGS:  Clear to auscultation bilaterally HEART:  RRR.  PMI not displaced or sustained,S1 and S2 within normal limits, no S3, no S4, no clicks, no rubs, no murmurs ABD:  Flat, positive bowel sounds normal in frequency in pitch, no bruits, no rebound, no guarding, no midline pulsatile mass, no hepatomegaly, no splenomegaly EXT:  2 plus pulses throughout, no edema, no cyanosis no clubbing SKIN:  No rashes no nodules NEURO:  Cranial nerves II through XII grossly intact, motor grossly intact throughout PSYCH:  Cognitively intact, oriented to person place and time   ASSESSMENT AND PLAN: .    #  CAD:  # Hyperlipidemia:  # Exertional dyspnea:  Mr. Nicolson continues to note exertional dyspnea, especially when it is hot outside.  He does have obstructive coronary disease but he had good collaterals at the time of his cath 07/2023. Haughey he does not have any smoking history but I think it might be worth doing pulmonary function testing to make sure there is no obstructive pulmonary disease contributing.  Will also start him back on Jardiance  as there is likely a component of HFpEF contributing.  Continue aspirin , Lasix , Imdur , rosuvastatin , spironolactone , ticagrelor , and valsartan .  # Hypertension:  Blood pressure is well-controlled on his current regimen of Imdur , spironolactone , and valsartan .  Not on beta-blocker due to bradycardia and Mobitz type I second-degree heart block.  # Mild aortic aneurysm:  Blood pressure well-controlled.  Continue current therapy.  Repeat echo in a year.  # OSA: Continue CPAP     Dispo: f/u 4 months  Signed, Annabella Scarce, MD

## 2024-06-26 ENCOUNTER — Other Ambulatory Visit (HOSPITAL_BASED_OUTPATIENT_CLINIC_OR_DEPARTMENT_OTHER): Payer: Self-pay | Admitting: Cardiovascular Disease

## 2024-07-06 ENCOUNTER — Encounter (HOSPITAL_BASED_OUTPATIENT_CLINIC_OR_DEPARTMENT_OTHER): Payer: Self-pay | Admitting: Cardiovascular Disease

## 2024-07-18 ENCOUNTER — Other Ambulatory Visit: Payer: Self-pay | Admitting: Internal Medicine

## 2024-07-18 ENCOUNTER — Ambulatory Visit (HOSPITAL_BASED_OUTPATIENT_CLINIC_OR_DEPARTMENT_OTHER): Admitting: Cardiovascular Disease

## 2024-07-20 ENCOUNTER — Encounter: Payer: Self-pay | Admitting: Internal Medicine

## 2024-07-20 ENCOUNTER — Ambulatory Visit: Admitting: Internal Medicine

## 2024-07-20 VITALS — BP 120/70 | HR 66 | Ht 66.0 in | Wt 280.8 lb

## 2024-07-20 DIAGNOSIS — Z7984 Long term (current) use of oral hypoglycemic drugs: Secondary | ICD-10-CM | POA: Diagnosis not present

## 2024-07-20 DIAGNOSIS — Z794 Long term (current) use of insulin: Secondary | ICD-10-CM | POA: Diagnosis not present

## 2024-07-20 DIAGNOSIS — E1159 Type 2 diabetes mellitus with other circulatory complications: Secondary | ICD-10-CM | POA: Diagnosis not present

## 2024-07-20 DIAGNOSIS — E1165 Type 2 diabetes mellitus with hyperglycemia: Secondary | ICD-10-CM

## 2024-07-20 DIAGNOSIS — Z7985 Long-term (current) use of injectable non-insulin antidiabetic drugs: Secondary | ICD-10-CM

## 2024-07-20 DIAGNOSIS — E785 Hyperlipidemia, unspecified: Secondary | ICD-10-CM

## 2024-07-20 LAB — POCT GLYCOSYLATED HEMOGLOBIN (HGB A1C): Hemoglobin A1C: 7.4 % — AB (ref 4.0–5.6)

## 2024-07-20 NOTE — Progress Notes (Signed)
 Patient ID: Erik Hancock, male   DOB: Jul 09, 1951, 73 y.o.   MRN: [id number]  HPI: Erik Hancock is a 73 y.o.-year-old male, returning for follow-up for DM2, dx in 2002, insulin -dependent since 2010, uncontrolled, with complications (CAD - s/p PTCA, CKD stage IIIa, DR). Pt. previously saw Dr. Kassie, but last visit with me 5 months ago.   He is with his wife who offers part of the history, especially regarding his medications, past medical history, diet, and activity.  Interim history: No increased urination, blurry vision, nausea, chest pain. He was started on the freestyle libre CGM since last visit.  Reviewed HbA1c: Lab Results  Component Value Date   HGBA1C 8.2 (A) 02/07/2024   HGBA1C 7.3 (H) 07/09/2023   HGBA1C 7.5 (A) 03/18/2023   HGBA1C 6.6 (H) 05/21/2022   HGBA1C 7.3 (A) 03/30/2022   HGBA1C 8.4 (H) 02/11/2022   HGBA1C 8.0 (A) 08/21/2021   HGBA1C 8.8 (A) 05/20/2021   HGBA1C 9.7 (A) 01/28/2021   HGBA1C 7.6 (A) 06/17/2020  09/24/2023: HbA1c 7.1%  Previously on: - Victoza  1.8 mg before breakfast - Lantus  24 units at bedtime - Novolog  30 units 2x a day, after breakfast and at bedtime! He was taken off Jardiance  in the hospital in 01/2022. He tried Metformin  >> stopped.  Currently on: - Tresiba  24 >> 28 >> still using 24 units daily >> 30 units daily - Novolog  20-30 >> ... - still using 30 units with every meal >> 20-30 units before meals - Ozempic  0.5 >> 1 >> 2 mg weekly - Jardiance  - restarted at 10 mg before breakfast - 05/2024  Pt. Tried a Libre CGM >> kept coming off.  He still has some at home.  Pt checks his sugars >4x a day:  Prev.: - am: 60s, 108-233 >> 165, 280 >> 120-180 >> 98-256 - 2h after b'fast: n/c   - before lunch: n/c - 2h after lunch: n/c >> 150-200s - before dinner: n/c - 2h after dinner: n/c - bedtime: n/c - nighttime: n/c Lowest sugar was 63 >> 98 >> 61; he has hypoglycemia awareness at 70.  Highest sugar was 336 >> 256 >>  200s  Glucometer:AccuChek  - + CKD stage 3a, last BUN/creatinine:  Lab Results  Component Value Date   BUN 14 05/30/2024   BUN 19 08/06/2023   CREATININE 1.10 05/30/2024   CREATININE 1.17 08/06/2023   Lab Results  Component Value Date   MICRALBCREAT 4 07/09/2023  He is on Diovan  160 mg daily.  -+ HL; last set of lipids: Lab Results  Component Value Date   CHOL 113 07/09/2023   HDL 56 07/09/2023   LDLCALC 37 07/09/2023   TRIG 110 07/09/2023   CHOLHDL 2.0 07/09/2023  On Crestor  10 mg daily.  - last eye exam was ~04/2024: + DR reportedly ([facility]  Available records are from 2019: + DR at that time. Dr. Alvia. IO injections.  - no numbness and tingling in his feet.  Last foot exam 07/13/2023. He was in the emergency room in 07/2023 with cellulitis of the right anterior lower leg.  This improved.  He also has a history of HTN, kidney stones, GERD. He had a heart cath in 07/2023: one blockage.  ROS: + see HPI  Past Medical History:  Diagnosis Date   Arthritis    all over (07/29/2018)   CAD S/P percutaneous coronary angioplasty    a. stent to mLAD 11/2001. b. DES to PDA/mLCx 2007. c. DES to ostial  LAD 07/2018.   Chronic lower back pain    CKD (chronic kidney disease), stage III (HCC)    CKD (chronic kidney disease), stage III (HCC)    Diabetic retinopathy (HCC)    Diabetic retinopathy (HCC)    GERD (gastroesophageal reflux disease)    High cholesterol    History of kidney stones    Hypertension    Mobitz type 2 second degree atrioventricular block    a. noted during 07/2018 adm, metoprolol  discontinued.   Morbid obesity (HCC) 07/28/2018   Prostatitis    Pulmonary nodule 2013   CT   Sleep apnea    stopbang=5   Suspected sleep apnea    Type II diabetes mellitus (HCC)    Past Surgical History:  Procedure Laterality Date   CARDIAC CATHETERIZATION  JUNE 2003   PATENT LAD STENT/ BODERLINE OBSTRUCTIVE DISEASE POSTERIOR DESCENDING ARTERY/ NORMAL LVF   CATARACT  EXTRACTION W/ INTRAOCULAR LENS  IMPLANT, BILATERAL Bilateral 2017   CERVICAL SCOVILLE FORAMINOTOMY W/ EXCISION OF HERNIATED NUCLEC PULPOSUS  2009   C6 - 7   CORONARY ANGIOPLASTY WITH STENT PLACEMENT  03/30/2006   DR EDMUNDS - 90% LCx@OM2  TAXUS  DES 3.0 X 20 -> 3.25 mm,   95% RPDA -- TAXUS DES 3.0 X 16 --> 3.5 mm   CORONARY ANGIOPLASTY WITH STENT PLACEMENT  11/2001   (Dr. Annis for Dr. Shlomo) - mLAD@D2  - TAXUS EXPRESS DES 3.5 x 15    CORONARY ANGIOPLASTY WITH STENT PLACEMENT  07/29/2018   CORONARY STENT INTERVENTION N/A 07/29/2018   Procedure: CORONARY STENT INTERVENTION;  Surgeon: Anner Alm ORN, MD;  Location: [facility];  Service: Cardiovascular;  Laterality: N/A;   CYSTOSCOPY W/ RETROGRADES  05/04/2012   Procedure: CYSTOSCOPY WITH RETROGRADE PYELOGRAM;  Surgeon: Toribio Neysa Repine, MD;  Location: [facility];  Service: Urology;  Laterality: Left;   CYSTOSCOPY W/ URETERAL STENT PLACEMENT  05/04/2012   Procedure: CYSTOSCOPY WITH STENT REPLACEMENT;  Surgeon: Toribio Neysa Repine, MD;  Location: [facility];  Service: Urology;  Laterality: Left;   CYSTOSCOPY WITH STENT PLACEMENT Left 04/04/2012   CYSTOSCOPY WITH URETEROSCOPY  05/04/2012   Procedure: CYSTOSCOPY WITH URETEROSCOPY;  Surgeon: Toribio Neysa Repine, MD;  Location: [facility];  Service: Urology;  Laterality: Left;       I & D EXTREMITY Right 02/11/2022   Procedure: IRRIGATION AND DEBRIDEMENT  OF HAND AND FOREARM;  Surgeon: Shari Easter, MD;  Location: [facility];  Service: Orthopedics;  Laterality: Right;   LEFT HEART CATH AND CORONARY ANGIOGRAPHY N/A 07/29/2018   Procedure: LEFT HEART CATH AND CORONARY ANGIOGRAPHY;  Surgeon: Anner Alm ORN, MD;  Location: [facility];  Service: Cardiovascular;  Laterality: N/A;   LEFT URETEROSCOPIC STONE EXTRACTION  03-14-2003   X2   PARTIAL KNEE ARTHROPLASTY Left 06/02/2022   Procedure: UNICOMPARTMENTAL KNEE;  Surgeon: Beverley Evalene BIRCH, MD;  Location: [facility];  Service: Orthopedics;  Laterality:  Left;   RIGHT/LEFT HEART CATH AND CORONARY ANGIOGRAPHY N/A 08/11/2023   Procedure: RIGHT/LEFT HEART CATH AND CORONARY ANGIOGRAPHY;  Surgeon: Wendel Lurena POUR, MD;  Location: [facility];  Service: Cardiovascular;  Laterality: N/A;   Social History   Socioeconomic History   Marital status: Married    Spouse name: Not on file   Number of children: Not on file   Years of education: Not on file   Highest education level: Not on file  Occupational History   Not on file  Tobacco Use   Smoking status: Never  Smokeless tobacco: Never  Vaping Use   Vaping status: Never Used  Substance and Sexual Activity   Alcohol use: Never   Drug use: Never   Sexual activity: Not Currently  Other Topics Concern   Not on file  Social History Narrative   Right handed    Lives with wife    Social Drivers of Health   Financial Resource Strain: Low Risk  (12/16/2023)   Overall Financial Resource Strain (CARDIA)    Difficulty of Paying Living Expenses: Not hard at all  Food Insecurity: No Food Insecurity (12/16/2023)   Hunger Vital Sign    Worried About Running Out of Food in the Last Year: Never true    Ran Out of Food in the Last Year: Never true  Transportation Needs: No Transportation Needs (12/16/2023)   PRAPARE - Administrator, Civil Service (Medical): No    Lack of Transportation (Non-Medical): No  Physical Activity: Inactive (12/16/2023)   Exercise Vital Sign    Days of Exercise per Week: 0 days    Minutes of Exercise per Session: 0 min  Stress: No Stress Concern Present (12/16/2023)   [employer] of Occupational Health - Occupational Stress Questionnaire    Feeling of Stress : Not at all  Social Connections: Moderately Integrated (12/16/2023)   Social Connection and Isolation Panel    Frequency of Communication with Friends and Family: Once a week    Frequency of Social Gatherings with Friends and Family: More than three times a week    Attends Religious Services:  More than 4 times per year    Active Member of [employer] or Organizations: No    Attends Banker Meetings: Never    Marital Status: Married  Catering manager Violence: Not At Risk (12/16/2023)   Humiliation, Afraid, Rape, and Kick questionnaire    Fear of Current or Ex-Partner: No    Emotionally Abused: No    Physically Abused: No    Sexually Abused: No   Current Outpatient Medications on File Prior to Visit  Medication Sig Dispense Refill   acetaminophen  (TYLENOL ) 500 MG tablet Take 2 tablets (1,000 mg total) by mouth every 6 (six) hours as needed for mild pain or moderate pain. 60 tablet 0   amoxicillin -clavulanate (AUGMENTIN ) 875-125 MG tablet Take 1 tablet by mouth 2 (two) times daily. 20 tablet 0   aspirin  81 MG tablet Take 81 mg by mouth daily.     empagliflozin  (JARDIANCE ) 10 MG TABS tablet Take 1 tablet (10 mg total) by mouth daily before breakfast. 90 tablet 3   furosemide  (LASIX ) 20 MG tablet TAKE ONE TABLET (20 MG TOTAL) BY MOUTH DAILY. 90 tablet 2   glucose blood (ACCU-CHEK AVIVA PLUS) test strip Use as instructed 2x a day 200 each 12   Insulin  Aspart FlexPen (NOVOLOG ) 100 UNIT/ML INJECT 20 TO 30 UNITS UNDER THE SKIN TWICE A DAY BEFORE MEALS 60 mL 1   insulin  degludec (TRESIBA  FLEXTOUCH) 200 UNIT/ML FlexTouch Pen Inject 30 Units into the skin daily. 18 mL 3   Insulin  Pen Needle 32G X 4 MM MISC Use 4x a day 300 each 3   isosorbide  mononitrate (IMDUR ) 30 MG 24 hr tablet TAKE ONE TABLET BY MOUTH ONCE DAILY 90 tablet 2   MAGNESIUM PO Take by mouth.     neomycin-bacitracin-polymyxin (NEOSPORIN) OINT Apply 1 Application topically as needed for wound care.     nitroGLYCERIN  (NITROLINGUAL ) 0.4 MG/SPRAY spray Place 1 spray under the tongue every 5 (five)  minutes x 3 doses as needed for chest pain. 25 g 4   pantoprazole  (PROTONIX ) 40 MG tablet Take 1 tablet (40 mg total) by mouth daily. 90 tablet 0   POTASSIUM PO Take by mouth.     rosuvastatin  (CRESTOR ) 10 MG tablet TAKE ONE  TABLET BY MOUTH ONCE DAILY 90 tablet 2   Semaglutide , 2 MG/DOSE, 8 MG/3ML SOPN Inject 2 mg as directed once a week. 9 mL 3   spironolactone  (ALDACTONE ) 25 MG tablet TAKE ONE TABLET (25 MG TOTAL) BY MOUTH DAILY. 90 tablet 1   tamsulosin  (FLOMAX ) 0.4 MG CAPS capsule Take 1 capsule (0.4 mg total) by mouth daily. 30 capsule 0   ticagrelor  (BRILINTA ) 60 MG TABS tablet TAKE ONE TABLET BY MOUTH TWO TIMES A DAY 180 tablet 2   valsartan  (DIOVAN ) 160 MG tablet Take 1 tablet (160 mg total) by mouth daily. 90 tablet 1   No current facility-administered medications on file prior to visit.   Allergies  Allergen Reactions   Beta Adrenergic Blockers     Metoprolol  stopped 07/2018 due to 2nd degree type 2 AV block.   Family History  Problem Relation Age of Onset   Hypertension Mother    Heart attack Father    Heart disease Father    Alzheimer's disease Father    Diabetes Father    Heart attack Paternal Grandfather    Heart disease Paternal Grandfather    Lupus Sister    PE: BP 120/70   Pulse 66   Ht 5' 6 (1.676 m)   Wt 280 lb 12.8 oz (127.4 kg)   SpO2 94%   BMI 45.32 kg/m  Wt Readings from Last 10 Encounters:  07/20/24 280 lb 12.8 oz (127.4 kg)  06/15/24 283 lb (128.4 kg)  05/30/24 288 lb (130.6 kg)  02/07/24 282 lb 9.6 oz (128.2 kg)  11/03/23 285 lb 12.8 oz (129.6 kg)  09/24/23 287 lb 6.4 oz (130.4 kg)  08/11/23 280 lb (127 kg)  07/29/23 286 lb (129.7 kg)  07/28/23 285 lb (129.3 kg)  07/23/23 285 lb (129.3 kg)   Constitutional: overweight, in NAD Eyes: no exophthalmos ENT: no thyromegaly, no cervical lymphadenopathy Cardiovascular: RRR, No MRG Respiratory: CTA B Musculoskeletal: no deformities Skin: no rashes Neurological: no tremor with outstretched hands Diabetic Foot Exam - Simple   Simple Foot Form Diabetic Foot exam was performed with the following findings: Yes 07/20/2024  9:01 AM  Visual Inspection No deformities, no ulcerations, no other skin breakdown bilaterally:  Yes Sensation Testing See comments: Yes Pulse Check Posterior Tibialis and Dorsalis pulse intact bilaterally: Yes Comments No sensation to monofilament in B forefeet. Onychodystrophy.    ASSESSMENT: 1. DM2, insulin -dependent, uncontrolled, with complications - CAD, s/p PTCA - CKD stage 3a - DR  2. HL  3.  Obesity class III  PLAN:  1. Patient with longstanding, uncontrolled, type 2 diabetes, on injectable antidiabetic regimen with weekly GLP-1 receptor agonist and basal/bolus insulin  regimen, which we adjusted at last visit.  He is now also back on Jardiance  lower dose.  At last visit, HbA1c increased from 7.1% to 8.2% -sugars were higher and quite variable.  He was checking sugars in the morning and after lunch but was not recording when the values were obtained.  We discussed about documenting whether these were before or after meals.  We discussed about adjusting the dose of NovoLog  based on the size of the meal and I also recommended to increase the Tresiba  dose.  I also recommended  a referral to nutrition and he agreed to schedule an appointment, but did not have this yet. CGM interpretation: -At today's visit, we reviewed his CGM downloads: It appears that 74% of values are in target range (goal >70%), while 21% are higher than 180 (goal <25%), and 5% are lower than 70 (goal <4%).  The calculated average blood sugar is 145.  The projected HbA1c for the next 3 months (GMI) is 6.8%. -Reviewing the CGM trends, sugars appear to be fluctuating mainly within the target range but with low blood sugars late after meals, and occasional increases in blood sugars right after meals.  We discussed that this is consistent with taking the insulin  too late.  He admits of taking NovoLog  after meals, when the sugars start increasing.  I advised him to move these 15 minutes before meals.  We will also reduce the doses to avoid further hypoglycemia.  For now we will continue the same dose of Tresiba .  Will  also continue Ozempic  and Jardiance . -We again discussed about seeing the nutritionist and I gave them the telephone number to schedule an appointment. - I suggested to:  Patient Instructions  Please continue: - Jardiance  10 mg before breakfast - Ozempic  2 mg weekly - Tresiba  30 units at bedtime  Decrease: - Novolog  - 15 min before the meals Smaller meal: 15 units Larger meal: 20 units    Please schedule an appt with Leita Constable with nutrition. [phone].  Please return in 3-4 months.  - we checked his HbA1c: 7.4% (improved) - advised to check sugars at different times of the day - 4x a day, rotating check times - advised for yearly eye exams >> he is UTD - at last visit I recommended alpha lipoic acid 600 mg twice a day for neuropathy.  At today's visit, on the foot exam, he does not have sensation up to mid foot.  We discussed about being very careful with his feet, not walking on hot sand, and looking at his feet every night to make sure there are no lesions. - return to clinic in 3-4 months  2. HL - Latest lipid panel was reviewed from 06/2023: All fractions at goal: Lab Results  Component Value Date   CHOL 113 07/09/2023   HDL 56 07/09/2023   LDLCALC 37 07/09/2023   TRIG 110 07/09/2023   CHOLHDL 2.0 07/09/2023  - He continues on Crestor  10 mg daily without side effects  3.  Obesity class III -He gained approximately 25 pounds last year, and he suspected that this may have been due to fluid retention.  Currently on Lasix . - She is currently on Ozempic  maximum dose which should also help with weight loss - He lost 2 pounds since last visit  Lela Fendt, MD PhD [facility]

## 2024-07-20 NOTE — Patient Instructions (Addendum)
 Please continue: - Jardiance  10 mg before breakfast - Ozempic  2 mg weekly - Tresiba  30 units at bedtime  Decrease: - Novolog  - 15 min before the meals Smaller meal: 15 units Larger meal: 20 units    Please schedule an appt with Leita Constable with nutrition. [phone].  Please return in 3-4 months.

## 2024-07-20 NOTE — Addendum Note (Signed)
 Addended by: CLEOTILDE ROLIN RAMAN on: 07/20/2024 09:11 AM   Modules accepted: Orders

## 2024-07-21 ENCOUNTER — Telehealth: Payer: Self-pay | Admitting: Physician Assistant

## 2024-07-21 ENCOUNTER — Ambulatory Visit: Payer: Self-pay

## 2024-07-21 ENCOUNTER — Other Ambulatory Visit: Payer: Self-pay | Admitting: Family Medicine

## 2024-07-21 ENCOUNTER — Ambulatory Visit: Payer: Self-pay | Admitting: Internal Medicine

## 2024-07-21 DIAGNOSIS — E1159 Type 2 diabetes mellitus with other circulatory complications: Secondary | ICD-10-CM | POA: Diagnosis not present

## 2024-07-21 DIAGNOSIS — U071 COVID-19: Secondary | ICD-10-CM | POA: Diagnosis not present

## 2024-07-21 DIAGNOSIS — K219 Gastro-esophageal reflux disease without esophagitis: Secondary | ICD-10-CM

## 2024-07-21 LAB — MICROALBUMIN / CREATININE URINE RATIO
Creatinine, Urine: 117 mg/dL (ref 20–320)
Microalb Creat Ratio: 14 mg/g{creat} (ref <30)
Microalb, Ur: 1.6 mg/dL

## 2024-07-21 LAB — LIPID PANEL W/REFLEX DIRECT LDL
Cholesterol: 103 mg/dL (ref <200)
HDL: 48 mg/dL (ref >=40)
LDL Cholesterol (Calc): 34 mg/dL
Non-HDL Cholesterol (Calc): 55 mg/dL (ref <130)
Total CHOL/HDL Ratio: 2.1 (calc) (ref <5.0)
Triglycerides: 127 mg/dL (ref <150)

## 2024-07-21 NOTE — Telephone Encounter (Signed)
 FYI Only or Action Required?: Action required by provider: update on patient condition.  Patient was last seen in primary care on 05/30/2024 by Duanne Butler DASEN, MD.  Called Nurse Triage reporting Covid Positive.  Symptoms began several days ago.  Interventions attempted: Nothing.  Symptoms are: gradually worsening.  Triage Disposition: Call PCP Within 24 Hours  Patient/caregiver understands and will follow disposition?: Yes       Copied from CRM #[id number]. Topic: Clinical - Red Word Triage >> Jul 21, 2024  5:13 PM Kevelyn M wrote: Red Word that prompted transfer to Nurse Triage: Wife calling in because both patient and her tested positive for COVID. Symptoms are body aches, headache, sore throat and cough. Patient was tested with an at home kit and wife from doctor's office. Reason for Disposition  [1] HIGH RISK patient (e.g., weak immune system, age > 64 years, obesity with BMI 30 or higher, pregnant, chronic lung disease or other chronic medical condition) AND [2] COVID symptoms (e.g., cough, fever)  (Exceptions: Already seen by PCP and no new or worsening symptoms.)  Answer Assessment - Initial Assessment Questions 1. COVID-19 DIAGNOSIS: How do you know that you have COVID? (e.g., positive lab test or self-test, diagnosed by doctor or NP/PA, symptoms after exposure).     + home test 2. COVID-19 EXPOSURE: Was there any known exposure to COVID before the symptoms began? CDC Definition of close contact: within 6 feet (2 meters) for a total of 15 minutes or more over a 24-hour period.      Wife is + 3. ONSET: When did the COVID-19 symptoms start?      A few days  4. WORST SYMPTOM: What is your worst symptom? (e.g., cough, fever, shortness of breath, muscle aches)     H/A, body ache, sore throat 5. COUGH: Do you have a cough? If Yes, ask: How bad is the cough?       Productive cough 6. FEVER: Do you have a fever? If Yes, ask: What is your temperature, how was it  measured, and when did it start?     denies 7. RESPIRATORY STATUS: Describe your breathing? (e.g., normal; shortness of breath, wheezing, unable to speak)      Denies SOB 8. BETTER-SAME-WORSE: Are you getting better, staying the same or getting worse compared to yesterday?  If getting worse, ask, In what way?     worse 9. OTHER SYMPTOMS: Do you have any other symptoms?  (e.g., chills, fatigue, headache, loss of smell or taste, muscle pain, sore throat)     Fatigue, sore thoat 10. HIGH RISK DISEASE: Do you have any chronic medical problems? (e.g., asthma, heart or lung disease, weak immune system, obesity, etc.)       Diabetes, heart hx 11. VACCINE: Have you had the COVID-19 vaccine? If Yes, ask: Which one, how many shots, when did you get it?       Endorses got all of them 12. PREGNANCY: Is there any chance you are pregnant? When was your last menstrual period?       N/a 13. O2 SATURATION MONITOR:  Do you use an oxygen saturation monitor (pulse oximeter) at home? If Yes, ask What is your reading (oxygen level) today? What is your usual oxygen saturation reading? (e.g., 95%)       N/a    Triager strongly advised UC d/t being after hours and pt is symptomatic and having chronic dx. Spouse verbalized understanding and to call back/911 with worsening symptoms.   Triager  will forward encounter for Dr Duanne 's office to review and advise. Spouse verbalized understanding and is expecting call back from office for next steps. Triager also advised that if pt does not hear back from office, to follow disposition for further evaluation/treatment at [facility].  Protocols used: Coronavirus (COVID-19) Diagnosed or Suspected-A-AH

## 2024-07-21 NOTE — Telephone Encounter (Signed)
   The patient's daughter Suzen called the answering service after-hours today.  He has been diagnosed with Covid and states they did a televisit for this. He takes Brilinta . He states they were told that they would have to stop the Brilinta  in order to prescribe anything. The web MD recommended they call cardiology to either ask if we can stop Brilinta  or if we can call in something to treat his Covid infection. Brilinta  is contraindicated with Paxlovid, and patient has significant history of CAD including several prior stents in LAD/Cx and chronic total occlusion of RCA. I spoke with pharmD on staff at [facility] who reports they've taken Paxlovid off formulary at [facility] due to low efficacy in circulating strains. He is also on other medications which would pose additional drug/drug interaction with Paxlovid (Flomax , Jardiance , rosuvastatin ). Therefore at this time it seems risk of holding Brilinta  outweighs benefit of Paxlovid. I advised we otherwise do not treat Covid ourselves so would defer to the treating virtual team as to what they would recommend aside from medications that do not interfere with his current regimen. Suzen verbalized understanding and gratitude.  Will cc to Dr. Raford as RICK.  Micai Apolinar N Enya Bureau, PA-C

## 2024-07-25 ENCOUNTER — Other Ambulatory Visit: Payer: Self-pay | Admitting: Family Medicine

## 2024-07-25 MED ORDER — NIRMATRELVIR/RITONAVIR (PAXLOVID)TABLET: 3.0000 | ORAL_TABLET | Freq: Two times a day (BID) | ORAL | 0 refills | Status: AC

## 2024-08-10 ENCOUNTER — Other Ambulatory Visit (HOSPITAL_BASED_OUTPATIENT_CLINIC_OR_DEPARTMENT_OTHER): Payer: Self-pay | Admitting: Cardiovascular Disease

## 2024-09-18 ENCOUNTER — Other Ambulatory Visit: Payer: Self-pay | Admitting: Family Medicine

## 2024-09-27 ENCOUNTER — Ambulatory Visit (HOSPITAL_BASED_OUTPATIENT_CLINIC_OR_DEPARTMENT_OTHER): Admitting: Cardiovascular Disease

## 2024-10-16 ENCOUNTER — Other Ambulatory Visit: Payer: Self-pay | Admitting: Family Medicine

## 2024-10-16 DIAGNOSIS — K219 Gastro-esophageal reflux disease without esophagitis: Secondary | ICD-10-CM

## 2024-10-17 NOTE — Telephone Encounter (Signed)
 Requested Prescriptions  Pending Prescriptions Disp Refills   pantoprazole  (PROTONIX ) 40 MG tablet [Pharmacy Med Name: PANTOPRAZOLE  SODIUM 40MG  TABLET DR] 90 tablet 0    Sig: TAKE ONE TABLET (40 MG TOTAL) BY MOUTH DAILY.     Gastroenterology: Proton Pump Inhibitors Passed - 10/17/2024  3:13 PM      Passed - Valid encounter within last 12 months    Recent Outpatient Visits           4 months ago Generalized abdominal pain   [place] [facility] Duanne, Butler DASEN, MD   1 year ago Flank pain   [place] [facility] Duanne, Butler DASEN, MD   1 year ago Tick bite, unspecified site, initial encounter   [place] [facility] Duanne Butler DASEN, MD   1 year ago Polyarthralgia   [place] [facility] Duanne Butler DASEN, MD   2 years ago Cellulitis of left upper extremity   [place] [facility] [name], Butler DASEN, MD       Future Appointments             In 1 month Raford Riggs, MD [facility] at [facility], [place]

## 2024-10-19 DIAGNOSIS — E1159 Type 2 diabetes mellitus with other circulatory complications: Secondary | ICD-10-CM | POA: Diagnosis not present

## 2024-11-06 ENCOUNTER — Other Ambulatory Visit (HOSPITAL_BASED_OUTPATIENT_CLINIC_OR_DEPARTMENT_OTHER): Payer: Self-pay | Admitting: Emergency Medicine

## 2024-11-13 ENCOUNTER — Other Ambulatory Visit (HOSPITAL_BASED_OUTPATIENT_CLINIC_OR_DEPARTMENT_OTHER): Payer: Self-pay | Admitting: Cardiovascular Disease

## 2024-11-24 ENCOUNTER — Encounter: Payer: Self-pay | Admitting: Internal Medicine

## 2024-11-24 ENCOUNTER — Other Ambulatory Visit: Payer: Self-pay

## 2024-11-24 ENCOUNTER — Ambulatory Visit: Admitting: Internal Medicine

## 2024-11-24 VITALS — BP 120/60 | HR 64 | Ht 66.0 in | Wt 284.2 lb

## 2024-11-24 DIAGNOSIS — N1831 Chronic kidney disease, stage 3a: Secondary | ICD-10-CM | POA: Diagnosis not present

## 2024-11-24 DIAGNOSIS — Z6841 Body Mass Index (BMI) 40.0 and over, adult: Secondary | ICD-10-CM

## 2024-11-24 DIAGNOSIS — E785 Hyperlipidemia, unspecified: Secondary | ICD-10-CM | POA: Diagnosis not present

## 2024-11-24 DIAGNOSIS — E1122 Type 2 diabetes mellitus with diabetic chronic kidney disease: Secondary | ICD-10-CM | POA: Diagnosis not present

## 2024-11-24 DIAGNOSIS — Z7985 Long-term (current) use of injectable non-insulin antidiabetic drugs: Secondary | ICD-10-CM | POA: Diagnosis not present

## 2024-11-24 DIAGNOSIS — Z794 Long term (current) use of insulin: Secondary | ICD-10-CM | POA: Diagnosis not present

## 2024-11-24 DIAGNOSIS — E11319 Type 2 diabetes mellitus with unspecified diabetic retinopathy without macular edema: Secondary | ICD-10-CM

## 2024-11-24 DIAGNOSIS — E1159 Type 2 diabetes mellitus with other circulatory complications: Secondary | ICD-10-CM | POA: Diagnosis not present

## 2024-11-24 DIAGNOSIS — E66813 Obesity, class 3: Secondary | ICD-10-CM | POA: Diagnosis not present

## 2024-11-24 DIAGNOSIS — E1165 Type 2 diabetes mellitus with hyperglycemia: Secondary | ICD-10-CM

## 2024-11-24 DIAGNOSIS — Z7984 Long term (current) use of oral hypoglycemic drugs: Secondary | ICD-10-CM

## 2024-11-24 LAB — POCT GLYCOSYLATED HEMOGLOBIN (HGB A1C): Hemoglobin A1C: 8.1 % — AB (ref 4.0–5.6)

## 2024-11-24 MED ORDER — FREESTYLE LIBRE 3 PLUS SENSOR MISC: 1.0000 | 3 refills | Status: AC

## 2024-11-24 MED ORDER — FREESTYLE LIBRE 3 PLUS SENSOR MISC: 1.0000 | Status: DC

## 2024-11-24 MED ORDER — OZEMPIC (2 MG/DOSE) 8 MG/3ML ~~LOC~~ SOPN: PEN_INJECTOR | SUBCUTANEOUS | 3 refills | Status: AC

## 2024-11-24 MED ORDER — FREESTYLE LIBRE 3 READER DEVI: 0 refills | Status: AC

## 2024-11-24 NOTE — Patient Instructions (Signed)
 Please continue: - Jardiance  10 mg before breakfast - Ozempic  2 mg weekly - Tresiba  30 units at bedtime - Novolog  - 15 min before the meals Smaller meal: 15 units Regular meal: 20 units  Larger meal: 25 units   Please schedule an appt with Leita Constable with nutrition. [phone].  Please return in 3-4 months.

## 2024-11-24 NOTE — Progress Notes (Signed)
 Patient ID: Erik Hancock, male   DOB: [birth year], 74 y.o.   MRN: [id number]  HPI: Erik Hancock is a 74 y.o.-year-old male, returning for follow-up for DM2, dx in 2002, insulin -dependent since 2010, uncontrolled, with complications (CAD - s/p PTCA, CKD stage IIIa, DR). Pt. previously saw Dr. Kassie, but last visit with me 4 months ago.   He is with his wife who offers part of the history, especially regarding his medications, past medical history, medication, and activity.  Interim history: No increased urination, blurry vision, nausea, chest pain.  He does mention pain in his legs especially at night.  His wife remembers that he did try alpha lipoic acid but did not feel it helped much.  Reviewed HbA1c: Lab Results  Component Value Date   HGBA1C 7.4 (A) 07/20/2024   HGBA1C 8.2 (A) 02/07/2024   HGBA1C 7.3 (H) 07/09/2023   HGBA1C 7.5 (A) 03/18/2023   HGBA1C 6.6 (H) 05/21/2022   HGBA1C 7.3 (A) 03/30/2022   HGBA1C 8.4 (H) 02/11/2022   HGBA1C 8.0 (A) 08/21/2021   HGBA1C 8.8 (A) 05/20/2021   HGBA1C 9.7 (A) 01/28/2021  09/24/2023: HbA1c 7.1%  Previously on: - Victoza  1.8 mg before breakfast - Lantus  24 units at bedtime - Novolog  30 units 2x a day, after breakfast and at bedtime! He was taken off Jardiance  in the hospital in 01/2022. He tried Metformin  >> stopped.  Currently on: - Tresiba  24 >> 28 >> still using 24 units daily >> 30 units daily - Novolog  20-30 >> ... - still using 30 units with every meal >> 20-30 >> 15-20 units before meals - Ozempic  0.5 >> 1 >> 2 mg weekly - Jardiance  - restarted at 10 mg before breakfast - 05/2024  Pt checks his sugars >4x a day now back on the CGM- restarted the sensor yesterday: - am: 140-180 - 2h after b'fast: n/c - lunch: n/c - 2h after lunch: n/c - dinner: 200s - 2h after dinner: n/c  Previously:  Prev.: - am: 60s, 108-233 >> 165, 280 >> 120-180 >> 98-256 - 2h after b'fast: n/c   - before lunch: n/c - 2h after lunch: n/c >>  150-200s - before dinner: n/c - 2h after dinner: n/c - bedtime: n/c - nighttime: n/c Lowest sugar was 63 >> 98 >> 61 >> 120; he has hypoglycemia awareness at 70.  Highest sugar was 336 >> 256 >> 200s >> 200s.  Glucometer:AccuChek  - + CKD stage 3a, last BUN/creatinine:  Lab Results  Component Value Date   BUN 14 05/30/2024   BUN 19 08/06/2023   CREATININE 1.10 05/30/2024   CREATININE 1.17 08/06/2023   Lab Results  Component Value Date   MICRALBCREAT 14 07/20/2024   MICRALBCREAT 4 07/09/2023  He is on Diovan  160 mg daily.  -+ HL; last set of lipids: Lab Results  Component Value Date   CHOL 103 07/20/2024   HDL 48 07/20/2024   LDLCALC 34 07/20/2024   TRIG 127 07/20/2024   CHOLHDL 2.1 07/20/2024  On Crestor  10 mg daily.  - last eye exam was ~04/2024: + DR reportedly ([facility]  Available records are from 2019: + DR at that time. Dr. Alvia. IO injections.  - no numbness and tingling in his feet.  Last foot exam 07/20/2024. He was in the emergency room in 07/2023 with cellulitis of the right anterior lower leg.  This improved.  He also has a history of HTN, kidney stones, GERD. He had a heart cath in 07/2023:  one blockage.  ROS: + see HPI  Past Medical History:  Diagnosis Date   Arthritis    all over (07/29/2018)   CAD S/P percutaneous coronary angioplasty    a. stent to mLAD 11/2001. b. DES to PDA/mLCx 2007. c. DES to ostial LAD 07/2018.   Chronic lower back pain    CKD (chronic kidney disease), stage III (HCC)    CKD (chronic kidney disease), stage III (HCC)    Diabetic retinopathy (HCC)    Diabetic retinopathy (HCC)    GERD (gastroesophageal reflux disease)    High cholesterol    History of kidney stones    Hypertension    Mobitz type 2 second degree atrioventricular block    a. noted during 07/2018 adm, metoprolol  discontinued.   Morbid obesity (HCC) 07/28/2018   Prostatitis    Pulmonary nodule 2013   CT   Sleep apnea    stopbang=5   Suspected sleep  apnea    Type II diabetes mellitus (HCC)    Past Surgical History:  Procedure Laterality Date   CARDIAC CATHETERIZATION  JUNE 2003   PATENT LAD STENT/ BODERLINE OBSTRUCTIVE DISEASE POSTERIOR DESCENDING ARTERY/ NORMAL LVF   CATARACT EXTRACTION W/ INTRAOCULAR LENS  IMPLANT, BILATERAL Bilateral 2017   CERVICAL SCOVILLE FORAMINOTOMY W/ EXCISION OF HERNIATED NUCLEC PULPOSUS  2009   C6 - 7   CORONARY ANGIOPLASTY WITH STENT PLACEMENT  03/30/2006   DR EDMUNDS - 90% LCx@OM2  TAXUS  DES 3.0 X 20 -> 3.25 mm,   95% RPDA -- TAXUS DES 3.0 X 16 --> 3.5 mm   CORONARY ANGIOPLASTY WITH STENT PLACEMENT  11/2001   (Dr. Annis for Dr. Shlomo) - mLAD@D2  - TAXUS EXPRESS DES 3.5 x 15    CORONARY ANGIOPLASTY WITH STENT PLACEMENT  07/29/2018   CORONARY STENT INTERVENTION N/A 07/29/2018   Procedure: CORONARY STENT INTERVENTION;  Surgeon: Anner Alm ORN, MD;  Location: [facility] INVASIVE CV LAB;  Service: Cardiovascular;  Laterality: N/A;   CYSTOSCOPY W/ RETROGRADES  05/04/2012   Procedure: CYSTOSCOPY WITH RETROGRADE PYELOGRAM;  Surgeon: Toribio Neysa Repine, MD;  Location: [facility];  Service: Urology;  Laterality: Left;   CYSTOSCOPY W/ URETERAL STENT PLACEMENT  05/04/2012   Procedure: CYSTOSCOPY WITH STENT REPLACEMENT;  Surgeon: Toribio Neysa Repine, MD;  Location: [facility];  Service: Urology;  Laterality: Left;   CYSTOSCOPY WITH STENT PLACEMENT Left 04/04/2012   CYSTOSCOPY WITH URETEROSCOPY  05/04/2012   Procedure: CYSTOSCOPY WITH URETEROSCOPY;  Surgeon: Toribio Neysa Repine, MD;  Location: [facility];  Service: Urology;  Laterality: Left;       I & D EXTREMITY Right 02/11/2022   Procedure: IRRIGATION AND DEBRIDEMENT  OF HAND AND FOREARM;  Surgeon: Shari Easter, MD;  Location: [facility] OR;  Service: Orthopedics;  Laterality: Right;   LEFT HEART CATH AND CORONARY ANGIOGRAPHY N/A 07/29/2018   Procedure: LEFT HEART CATH AND CORONARY ANGIOGRAPHY;  Surgeon: Anner Alm ORN, MD;  Location[facility] INVASIVE CV LAB;  Service: Cardiovascular;   Laterality: N/A;   LEFT URETEROSCOPIC STONE EXTRACTION  03-14-2003   X2   PARTIAL KNEE ARTHROPLASTY Left 06/02/2022   Procedure: UNICOMPARTMENTAL KNEE;  Surgeon: Beverley Evalene BIRCH, MD;  Location: [facility];  Service: Orthopedics;  Laterality: Left;   RIGHT/LEFT HEART CATH AND CORONARY ANGIOGRAPHY N/A 08/11/2023   Procedure: RIGHT/LEFT HEART CATH AND CORONARY ANGIOGRAPHY;  Surgeon: Wendel Lurena POUR, MD;  Location: [facility];  Service: Cardiovascular;  Laterality: N/A;   Social History   Socioeconomic History   Marital status: Married  Spouse name: Not on file   Number of children: Not on file   Years of education: Not on file   Highest education level: Not on file  Occupational History   Not on file  Tobacco Use   Smoking status: Never   Smokeless tobacco: Never  Vaping Use   Vaping status: Never Used  Substance and Sexual Activity   Alcohol use: Never   Drug use: Never   Sexual activity: Not Currently  Other Topics Concern   Not on file  Social History Narrative   Right handed    Lives with wife    Social Drivers of Health   Tobacco Use: Low Risk (07/20/2024)   Patient History    Smoking Tobacco Use: Never    Smokeless Tobacco Use: Never    Passive Exposure: Not on file  Financial Resource Strain: Low Risk (12/16/2023)   Overall Financial Resource Strain (CARDIA)    Difficulty of Paying Living Expenses: Not hard at all  Food Insecurity: No Food Insecurity (12/16/2023)   Hunger Vital Sign    Worried About Running Out of Food in the Last Year: Never true    Ran Out of Food in the Last Year: Never true  Transportation Needs: No Transportation Needs (12/16/2023)   PRAPARE - Administrator, Civil Service (Medical): No    Lack of Transportation (Non-Medical): No  Physical Activity: Inactive (12/16/2023)   Exercise Vital Sign    Days of Exercise per Week: 0 days    Minutes of Exercise per Session: 0 min  Stress: No Stress Concern Present (12/16/2023)    Erik Hancock    Feeling of Stress : Not at all  Social Connections: Moderately Integrated (12/16/2023)   Social Connection and Isolation Panel    Frequency of Communication with Friends and Family: Once a week    Frequency of Social Gatherings with Friends and Family: More than three times a week    Attends Religious Services: More than 4 times per year    Active Member of [employer] or Organizations: No    Attends Banker Meetings: Never    Marital Status: Married  Catering Manager Violence: Not At Risk (12/16/2023)   Humiliation, Afraid, Rape, and Kick Hancock    Fear of Current or Ex-Partner: No    Emotionally Abused: No    Physically Abused: No    Sexually Abused: No  Depression (PHQ2-9): Low Risk (12/16/2023)   Depression (PHQ2-9)    PHQ-2 Score: 0  Alcohol Screen: Low Risk (12/16/2023)   Alcohol Screen    Last Alcohol Screening Score (AUDIT): 0  Housing: Unknown (12/16/2023)   Housing Stability Vital Sign    Unable to Pay for Housing in the Last Year: No    Number of Times Moved in the Last Year: Not on file    Homeless in the Last Year: No  Utilities: Not At Risk (12/16/2023)   AHC Utilities    Threatened with loss of utilities: No  Health Literacy: Adequate Health Literacy (12/16/2023)   B1300 Health Literacy    Frequency of need for help with medical instructions: Never   Current Outpatient Medications on File Prior to Visit  Medication Sig Dispense Refill   acetaminophen  (TYLENOL ) 500 MG tablet Take 2 tablets (1,000 mg total) by mouth every 6 (six) hours as needed for mild pain or moderate pain. 60 tablet 0   aspirin  81 MG tablet Take 81 mg by mouth daily.  BRILINTA  60 MG TABS tablet TAKE ONE TABLET BY MOUTH TWO TIMES A DAY 180 tablet 2   empagliflozin  (JARDIANCE ) 10 MG TABS tablet Take 1 tablet (10 mg total) by mouth daily before breakfast. 90 tablet 3   furosemide  (LASIX ) 20 MG tablet TAKE  ONE TABLET (20 MG TOTAL) BY MOUTH DAILY. 90 tablet 2   glucose blood (ACCU-CHEK AVIVA PLUS) test strip Use as instructed 2x a day 200 each 12   Insulin  Aspart FlexPen (NOVOLOG ) 100 UNIT/ML INJECT 20 TO 30 UNITS UNDER THE SKIN TWICE A DAY BEFORE MEALS 60 mL 1   insulin  degludec (TRESIBA  FLEXTOUCH) 200 UNIT/ML FlexTouch Pen Inject 30 Units into the skin daily. 18 mL 3   Insulin  Pen Needle 32G X 4 MM MISC Use 4x a day 300 each 3   isosorbide  mononitrate (IMDUR ) 30 MG 24 hr tablet TAKE ONE TABLET BY MOUTH ONCE DAILY 90 tablet 2   MAGNESIUM PO Take by mouth. (Patient not taking: Reported on 07/20/2024)     neomycin-bacitracin-polymyxin (NEOSPORIN) OINT Apply 1 Application topically as needed for wound care. (Patient not taking: Reported on 07/20/2024)     nitroGLYCERIN  (NITROLINGUAL ) 0.4 MG/SPRAY spray Place 1 spray under the tongue every 5 (five) minutes x 3 doses as needed for chest pain. 25 g 4   OZEMPIC , 2 MG/DOSE, 8 MG/3ML SOPN INJECT 2 MG SUBCUTANEOUSLY  ONCE A WEEK AS DIRECTED 9 mL 0   pantoprazole  (PROTONIX ) 40 MG tablet TAKE ONE TABLET (40 MG TOTAL) BY MOUTH DAILY. 90 tablet 0   POTASSIUM PO Take by mouth. (Patient not taking: Reported on 07/20/2024)     rosuvastatin  (CRESTOR ) 10 MG tablet TAKE ONE TABLET BY MOUTH ONCE DAILY 90 tablet 2   spironolactone  (ALDACTONE ) 25 MG tablet TAKE ONE TABLET (25 MG TOTAL) BY MOUTH DAILY. 90 tablet 1   tamsulosin  (FLOMAX ) 0.4 MG CAPS capsule Take 1 capsule (0.4 mg total) by mouth daily. 30 capsule 0   valsartan  (DIOVAN ) 160 MG tablet TAKE 1 TABLET (160 MG TOTAL) BY MOUTH DAILY. 90 tablet 1   No current facility-administered medications on file prior to visit.   Allergies  Allergen Reactions   Beta Adrenergic Blockers     Metoprolol  stopped 07/2018 due to 2nd degree type 2 AV block.   Family History  Problem Relation Age of Onset   Hypertension Mother    Heart attack Father    Heart disease Father    Alzheimer's disease Father    Diabetes Father     Heart attack Paternal Grandfather    Heart disease Paternal Grandfather    Lupus Sister    PE: BP 120/60   Pulse 64   Ht 5' 6 (1.676 m)   Wt 284 lb 3.2 oz (128.9 kg)   SpO2 97%   BMI 45.87 kg/m  Wt Readings from Last 10 Encounters:  11/24/24 284 lb 3.2 oz (128.9 kg)  07/20/24 280 lb 12.8 oz (127.4 kg)  06/15/24 283 lb (128.4 kg)  05/30/24 288 lb (130.6 kg)  02/07/24 282 lb 9.6 oz (128.2 kg)  11/03/23 285 lb 12.8 oz (129.6 kg)  09/24/23 287 lb 6.4 oz (130.4 kg)  08/11/23 280 lb (127 kg)  07/29/23 286 lb (129.7 kg)  07/28/23 285 lb (129.3 kg)   Constitutional: overweight, in NAD Eyes: no exophthalmos ENT: no thyromegaly, no cervical lymphadenopathy Cardiovascular: RRR, No MRG Respiratory: CTA B Musculoskeletal: no deformities Skin: no rashes Neurological: no tremor with outstretched hands  ASSESSMENT: 1. DM2, insulin -dependent, uncontrolled, with  complications - CAD, s/p PTCA - CKD stage 3a - DR  2. HL  3.  Obesity class III  PLAN:  1. Patient with longstanding, uncontrolled, type 2 diabetes, on oral antidiabetic regimen with SGLT2 inhibitor and low-dose, injectable antidiabetic regimen with weekly GLP-1 receptor agonist and basal/bolus insulin  regimen, which were adjusted at last visit.  At last visit, sugars were fluctuating mainly within the target range but with low blood sugars late after meals, and occasional increases in blood sugars right after meals.  We discussed that this was consistent with taking insulin  too late.  He admitted taking NovoLog  after meals, when the sugars started to increase.  I advised him to try to move the boluses 15 minutes before each meal.  We also reduced the dose to avoid further hypoglycemia.  I again recommended to see a nutritionist and gave him the telephone number to schedule the appointment.appointment, but he did not have the appointment yet. -Since last visit, he had the sensor off and was checking blood sugars manually.  When he  attached the sensor yesterday, he noticed higher blood sugars.  We discussed about trying his best to stay on the sensor.  We discussed about where to attach it and how to avoid the sensor falling off.  He agrees to stay on the sensor for now.  We also discussed about how to use his NovoLog  since sugars are increasing as the day goes by.  He is still taking it after meals, which we discussed is inadequate.  He absolutely needs to inject it 15 to 20 minutes before meals and we discussed that for larger meals he will need larger doses.  I again recommended to see nutrition. - I suggested to:  Patient Instructions  Please continue: - Jardiance  10 mg before breakfast - Ozempic  2 mg weekly - Tresiba  30 units at bedtime - Novolog  - 15 min before the meals Smaller meal: 15 units Regular meal: 20 units  Larger meal: 25 units   Please schedule an appt with Leita Constable with nutrition. [phone].  Please return in 3-4 months.  - we checked his HbA1c: 8.1% (higher) - advised to check sugars at different times of the day - 4x a day, rotating check times - advised for yearly eye exams >> he is UTD -I previously recommended alpha lipoic acid 600 mg twice a day for neuropathy. On the foot exam, he did not have sensation up to mid foot.  We discussed about being very careful with his feet, not walking on hot sand, and looking at his feet every night to make sure there are no lesions.   - return to clinic in 3 to 4 months  2. HL - Latest lipid panel from 06/2024 showed all fractions at goal: Lab Results  Component Value Date   CHOL 103 07/20/2024   HDL 48 07/20/2024   LDLCALC 34 07/20/2024   TRIG 127 07/20/2024   CHOLHDL 2.1 07/20/2024  - He continues Crestor  10 mg daily-no side effects  3.  Obesity class III - He gained approximately 25 pounds in 2024, and he suspected that this may have been due to fluid retention.  Currently on Lasix . - He is currently on Ozempic  maximum dose, which should also  help with weight loss - he lost 2 pounds before last visit and gained 4 pounds since then  Lela Fendt, MD PhD [facility]

## 2024-12-08 ENCOUNTER — Ambulatory Visit (HOSPITAL_BASED_OUTPATIENT_CLINIC_OR_DEPARTMENT_OTHER): Admitting: Cardiovascular Disease

## 2024-12-08 ENCOUNTER — Encounter (HOSPITAL_BASED_OUTPATIENT_CLINIC_OR_DEPARTMENT_OTHER): Payer: Self-pay | Admitting: Cardiovascular Disease

## 2024-12-08 VITALS — BP 115/60 | HR 78 | Resp 17 | Ht 66.0 in | Wt 289.0 lb

## 2024-12-08 DIAGNOSIS — I739 Peripheral vascular disease, unspecified: Secondary | ICD-10-CM | POA: Diagnosis not present

## 2024-12-08 DIAGNOSIS — I10 Essential (primary) hypertension: Secondary | ICD-10-CM | POA: Diagnosis not present

## 2024-12-08 NOTE — Patient Instructions (Signed)
 Medication Instructions:  Your physician recommends that you continue on your current medications as directed. Please refer to the Current Medication list given to you today.   *If you need a refill on your cardiac medications before your next appointment, please call your pharmacy*  Lab Work: NONE  Testing/Procedures: Your physician has requested that you have an ankle brachial index (ABI). During this test an ultrasound and blood pressure cuff are used to evaluate the arteries that supply the arms and legs with blood. Allow thirty minutes for this exam. There are no restrictions or special instructions.  Please note: We ask at that you not bring children with you during ultrasound (echo/ vascular) testing. Due to room size and safety concerns, children are not allowed in the ultrasound rooms during exams. Our front office staff cannot provide observation of children in our lobby area while testing is being conducted. An adult accompanying a patient to their appointment will only be allowed in the ultrasound room at the discretion of the ultrasound technician under special circumstances. We apologize for any inconvenience.  Your physician has requested that you have a lower extremity arterial duplex. This test is an ultrasound of the arteries in the legs or arms. It looks at arterial blood flow in the legs and arms. Allow one hour for Lower and Upper Arterial scans. There are no restrictions or special instructions.  Please note: We ask at that you not bring children with you during ultrasound (echo/ vascular) testing. Due to room size and safety concerns, children are not allowed in the ultrasound rooms during exams. Our front office staff cannot provide observation of children in our lobby area while testing is being conducted. An adult accompanying a patient to their appointment will only be allowed in the ultrasound room at the discretion of the ultrasound technician under special circumstances.  We apologize for any inconvenience.  Follow-Up: At [facility], you and your health needs are our priority.  As part of our continuing mission to provide you with exceptional heart care, our providers are all part of one team.  This team includes your primary Cardiologist (physician) and Advanced Practice Providers or APPs (Physician Assistants and Nurse Practitioners) who all work together to provide you with the care you need, when you need it.  Your next appointment:   6 month(s)  Provider:   Annabella Scarce, MD, Rosaline Bane, NP, or Reche Finder, NP    We recommend signing up for the patient portal called MyChart.  Sign up information is provided on this After Visit Summary.  MyChart is used to connect with patients for Virtual Visits (Telemedicine).  Patients are able to view lab/test results, encounter notes, upcoming appointments, etc.  Non-urgent messages can be sent to your provider as well.   To learn more about what you can do with MyChart, go to [email or web address].

## 2024-12-08 NOTE — Progress Notes (Signed)
 " Cardiology Office Note:  .   Date:  12/08/2024  ID:  Erik Hancock, DOB 03-Jul-1951, MRN [id number] PCP: Duanne Butler DASEN, MD  [facility] Providers Cardiologist:  Annabella Scarce, MD    History of Present Illness: .    Erik Hancock is a 74 y.o. male with CAD s/p PCI, mild descending aortic aneurysm, OSA on CPAP, diabetes, hypertension and hyperlipidemia here for follow-up.  He initially established care 07/2018.  At that appointment he reported increasing exertional dyspnea.  Symptoms were progressive over the preceding year.  He had no chest pain but did get discomfort in his left arm.  This was similar to when he previously required coronary stenting.  In 2012 Erik Hancock had angina and underwent PCI of the LAD.  He had no other CAD.   He had an echo 01/2014 that revealed LVEF 60-65% and an moderately dilated RV.  He was referred for left heart catheterization.  At that time he was found to have three-vessel CAD with stents in LAD, left circumflex, and PDA.  He was noted to have a 95% ostial LAD lesion that was successfully treated with scoring balloon angioplasty and implantation of an Orsiro 3.5x13 drug-eluting stent.  Erik Hancock had a sleep study that was positive.  It was felt that he would benefit more from a BiPAP but he declined.    Erik Hancock followed up with Erik Canterbury, PA on 02/2020 and reported occasional atypical chest pain.  Ticagrelor  was reduced to 60 mg twice daily. At his appointment on 08/2021, he continued to report atypical chest pain. He underwent a nuclear stress test 09/2021 that was low risk with LVEF 56%. Echo performed 09/2021 revealed grade 1 diastolic dysfunction and his ascending aorta was 3.8cm. He had a home sleep study and CPAP was recommended.    He was started on valsartan  due to poorly controlled blood pressures. He had exertional dyspnea that was thought to be due to obesity and deconditioning. We encouraged exercise and weight loss. He had ABIs 01/2022  and his arteries were non-compressible. He also had an echo at that time with LVEF 60-65% and moderate LVH. Ascending aorta was 3.8 cm and RA pressure was 8 mmHg. He had a left knee replacement 05/2022.   Blood pressure remained uncontrolled at his visit 06/2022.  He was encouraged to start his Ozempic  for both diabetes and weight loss.  Spironolactone  was added to his regimen.  He is on able to tolerate nodal agents due to beta-blockers or thiazide diuretics due to hypokalemia.  Given his ankle edema, amlodipine  was not added.  At his visit 05/2024 he reproted occasional CP and exertional dyspnea.  Repeat echocardiogram 05/2024 revealed LVEF 60-65% with mild LVH.  There were episodes of Mobitz 2 AV block noted.  Ascending aorta was 3.9 cm.  Right atrial pressure was 3 mmHg.  He was started on Jardiance . PFTs were ordered but not performed.   Discussed the use of AI scribe software for clinical note transcription with the patient, who gave verbal consent to proceed.  History of Present Illness Erik Hancock has ongoing issues with his right knee, for which he recently received an injection. The injection has provided some relief, but he still experiences discomfort, particularly when first standing up after sitting. He describes a sensation of slipping in the knee after taking a few steps. The knee pain impacts his ability to walk and perform exercises effectively.  He experiences shortness of breath, which he feels  has improved recently. He believes Jardiance  has helped him perform activities with less shortness of breath. He has no chest pain or pressure.  Regarding his diabetes management, he is on Ozempic  but it has not significantly reduced his appetite. He experiences significant fluctuations in blood sugar levels, ranging from 50 to 300. He uses a continuous glucose monitor, but often does not hear the alerts due to hearing difficulties.  Dr. Vianne recently reminded him that he needs to take his short  acting insulin  only before eating his meals so that he doesn't bottom out his glucose.  He reports pain in his lower legs and ankles, particularly at night, which has been ongoing for several months. This pain disrupts his sleep. He has been told he has neuropathy in his feet.   He has not needed to use nitroglycerin  recently. His blood pressure readings have been stable around 120 systolic.  ROS:  As per HPI  Studies Reviewed: SABRA   EKG Interpretation Date/Time:  Friday December 08 2024 08:27:30 EST Ventricular Rate:  65 PR Interval:  342 QRS Duration:  92 QT Interval:  392 QTC Calculation: 407 R Axis:   2  Text Interpretation: Sinus rhythm with sinus arrhythmia with 1st degree A-V block Low voltage QRS No significant change since last tracing Confirmed by Erik Hancock ([id number]) on 12/08/2024 8:29:17 AM   LHC 07/2023:   Prox LAD lesion is 5% stenosed.   Ost Cx to Prox Cx lesion is 30% stenosed.   Ost 2nd Mrg lesion is 40% stenosed.   Ost RCA to Prox RCA lesion is 100% stenosed.   Prox Cx to Dist Cx lesion is 10% stenosed.   Prox LAD to Mid LAD lesion is 10% stenosed.   Non-stenotic Ost LAD lesion was previously treated.   1.  Chronic total occlusion of ostial to proximal right coronary artery that is collateralized by the left system. 2.  Patent proximal to mid LAD stents and patent mid left circumflex stent with mild to moderate diffuse disease elsewhere. 3.  Fick cardiac output of 4.5 L/min and Fick cardiac index of 2.2 L/min/m with the following hemodynamics:             Right atrial pressure mean of 10 mmHg             Right ventricular pressure 37/12 with an end-diastolic pressure of 13 mmHg             Wedge pressure mean of 16 mmHg             Pulmonary artery pressure 31/16 with a mean of 27 mmHg             PVR of 2.4 Woods units             Pulmonary artery pulsatility index of 1.5 4.  LVEDP of 24 mmHg.   Recommendation: Medical therapy.  The chronic total occlusion  of the right coronary artery does not seem to be an optimal candidate for revascularization given its ostial involvement.    Echo 05/2024:    1. Left ventricular ejection fraction, by estimation, is 60 to 65%. The  left ventricle has normal function. The left ventricle has no regional  wall motion abnormalities. There is mild left ventricular hypertrophy.  Mobitz 2 AV block.   2. Right ventricular systolic function is normal. The right ventricular  size is normal. Tricuspid regurgitation signal is inadequate for assessing  PA pressure.   3. Left atrial size was moderately  dilated.   4. Right atrial size was moderately dilated.   5. The mitral valve is degenerative. Trivial mitral valve regurgitation.   6. The aortic valve is tricuspid. Aortic valve regurgitation is not  visualized. Aortic valve sclerosis is present, with no evidence of aortic  valve stenosis.   7. Aortic dilatation noted. There is borderline dilatation of the  ascending aorta, measuring 39 mm.   8. The inferior vena cava is normal in size with greater than 50%  respiratory variability, suggesting right atrial pressure of 3 mmHg.   9. Cannot exclude a small PFO.     Risk Assessment/Calculations:         STOP-Bang Score:         Physical Exam:   VS:  BP 115/60 (BP Location: Left Arm, Patient Position: Sitting, Cuff Size: Normal)   Pulse 78   Resp 17   Ht 5' 6 (1.676 m)   Wt 289 lb (131.1 kg)   SpO2 97%   BMI 46.65 kg/m  , BMI Body mass index is 46.65 kg/m. GENERAL:  Well appearing HEENT: Pupils equal round and reactive, fundi not visualized, oral mucosa unremarkable NECK:  No jugular venous distention, waveform within normal limits, carotid upstroke brisk and symmetric, no bruits, no thyromegaly LUNGS:  Clear to auscultation bilaterally HEART:  RRR.  PMI not displaced or sustained,S1 and S2 within normal limits, no S3, no S4, no clicks, no rubs, no murmurs ABD:  Central adiposity. Positive bowel sounds  normal in frequency in pitch, no bruits, no rebound, no guarding, no midline pulsatile mass, no hepatomegaly, no splenomegaly EXT:  Unable to palpate R DP/PT.   Trace bilateral LE edema, no cyanosis no clubbing SKIN:  No rashes no nodules NEURO:  Cranial nerves II through XII grossly intact, motor grossly intact throughout PSYCH:  Cognitively intact, oriented to person place and time   ASSESSMENT AND PLAN: .    Assessment & Plan # CAD:  RCA is chronically occluded with collateralization. Patent stents in the mid LAD and LCX on [facility] 07/2023.  Lipids are well-managed.  Continue aspirin , ticagrelor , and rosuvastatin .  He has not needed to use any sublingual nitroglycerin .  # Peripheral artery disease He reports calf pain with walking on the right side.  Complicated by the fact that he also has orthopedic knee issues.  He had ABIs previously that were noncompressible.  Will get ABIs and arterial Dopplers to better assess to make sure there is no obstructive PAD.  Continue antiplatelets and lipid-lowering therapies. - Ordered ABI and ultrasound to assess blood flow in the legs.  # HFpEF:  Respiratory status improved after starting Jardiance .  Continue this and blood pressure management.  Continue Lasix .  #  Primary hypertension Blood pressure well-controlled at 120/80 mmHg. No recent home monitoring. - Continue current antihypertensive regimen including Imdur , spironolactone , and valsartan .    Dispo: Follow-up in 6 months  Signed, Annabella Scarce, MD   "

## 2024-12-14 ENCOUNTER — Other Ambulatory Visit: Payer: Self-pay | Admitting: Internal Medicine

## 2024-12-15 ENCOUNTER — Encounter: Payer: Self-pay | Admitting: Family Medicine

## 2024-12-15 ENCOUNTER — Ambulatory Visit: Admitting: Family Medicine

## 2024-12-15 ENCOUNTER — Ambulatory Visit: Payer: Self-pay

## 2024-12-15 VITALS — BP 124/64 | HR 100 | Temp 98.0°F | Ht 66.0 in | Wt 290.0 lb

## 2024-12-15 DIAGNOSIS — J069 Acute upper respiratory infection, unspecified: Secondary | ICD-10-CM | POA: Diagnosis not present

## 2024-12-15 MED ORDER — AMOXICILLIN-POT CLAVULANATE 875-125 MG PO TABS: 1.0000 | ORAL_TABLET | Freq: Two times a day (BID) | ORAL | 0 refills | Status: AC

## 2024-12-15 MED ORDER — HYDROCODONE BIT-HOMATROP MBR 5-1.5 MG/5ML PO SOLN: 5.0000 mL | Freq: Three times a day (TID) | ORAL | 0 refills | Status: AC | PRN

## 2024-12-15 NOTE — Telephone Encounter (Signed)
" °  FYI Only or Action Required?: FYI only for provider: appointment scheduled on today.  Patient was last seen in primary care on 05/30/2024 by Duanne Butler DASEN, MD.  Called Nurse Triage reporting Cough.  Symptoms began a week ago.  Interventions attempted: OTC medications: Delsym, dayquil.  Symptoms are: unchanged.  Triage Disposition: See Physician Within 24 Hours  Patient/caregiver understands and will follow disposition?: Yes  Reason for Disposition  [1] Continuous (nonstop) coughing interferes with work or school AND [2] no improvement using cough treatment per Care Advice  Answer Assessment - Initial Assessment Questions Approximately one week ago Erik Hancock developed a productive cough of brown and tan phlegm, shortness of breath during cough only, denies chest pain and respiratory distress. Cough is keeping him awake. Nasal drainage has blood when blowing nose, no active bleeding.  Using Delsym and Dayquil without effect. Scheduled acute visit with pcp today.    1. ONSET: When did the cough begin?      One week ago 2. SEVERITY: How bad is the cough today?      Frequent  3. SPUTUM: Describe the color of your sputum (e.g., none, dry cough; clear, white, yellow, green)    Brown and tan 4. HEMOPTYSIS: Are you coughing up any blood? If Yes, ask: How much? (e.g., flecks, streaks, tablespoons, etc.)     brownish 5. DIFFICULTY BREATHING: Are you having difficulty breathing? If Yes, ask: How bad is it? (e.g., mild, moderate, severe)      Shortness of breath during cough 6. FEVER: Do you have a fever? If Yes, ask: What is your temperature, how was it measured, and when did it start?     Denies  7. CARDIAC HISTORY: Do you have any history of heart disease? (e.g., heart attack, congestive heart failure)       8. LUNG HISTORY: Do you have any history of lung disease?  (e.g., pulmonary embolus, asthma, emphysema)      9. PE RISK FACTORS: Do you have a history of blood  clots? (or: recent major surgery, recent prolonged travel, bedridden)      10. OTHER SYMPTOMS: Do you have any other symptoms? (e.g., runny nose, wheezing, chest pain)      Blood when blowing nose-not actively bleeding  Protocols used: Cough - Acute Productive-A-AH  Message from [name] sent at 12/15/2024  8:05 AM EST  Summary: coughing mucus brown/tan nose bleed after coughing   Reason for Triage: cough for a week coughing mucus brown/tan nose bleed after coughing diabetic 216 Some wheezing "

## 2024-12-15 NOTE — Progress Notes (Signed)
 "  Subjective:    Patient ID: Erik Hancock, male    DOB: 03/11/1951, 74 y.o.   MRN: [id number]  Patient states that his symptoms began on Monday.  Symptoms include pain and pressure behind his right eye and in his right forehead.  He also reports a cough productive of yellow sputum.  He has head congestion.  He has a little bit of a sore throat from postnasal drip.  He reports constant coughing.  He states that he is coughed so much his ribs hurt.  He denies any shortness of breath beyond his baseline.  He denies any pleurisy or hemoptysis or purulent sputum.  He does report diffuse bodyaches. Past Medical History:  Diagnosis Date   Arthritis    all over (07/29/2018)   CAD S/P percutaneous coronary angioplasty    a. stent to mLAD 11/2001. b. DES to PDA/mLCx 2007. c. DES to ostial LAD 07/2018.   Chronic lower back pain    CKD (chronic kidney disease), stage III (HCC)    CKD (chronic kidney disease), stage III (HCC)    Diabetic retinopathy (HCC)    Diabetic retinopathy (HCC)    GERD (gastroesophageal reflux disease)    High cholesterol    History of kidney stones    Hypertension    Mobitz type 2 second degree atrioventricular block    a. noted during 07/2018 adm, metoprolol  discontinued.   Morbid obesity (HCC) 07/28/2018   Prostatitis    Pulmonary nodule 2013   CT   Sleep apnea    stopbang=5   Suspected sleep apnea    Type II diabetes mellitus (HCC)    Past Surgical History:  Procedure Laterality Date   CARDIAC CATHETERIZATION  JUNE 2003   PATENT LAD STENT/ BODERLINE OBSTRUCTIVE DISEASE POSTERIOR DESCENDING ARTERY/ NORMAL LVF   CATARACT EXTRACTION W/ INTRAOCULAR LENS  IMPLANT, BILATERAL Bilateral 2017   CERVICAL SCOVILLE FORAMINOTOMY W/ EXCISION OF HERNIATED NUCLEC PULPOSUS  2009   C6 - 7   CORONARY ANGIOPLASTY WITH STENT PLACEMENT  03/30/2006   DR EDMUNDS - 90% LCx@OM2  TAXUS  DES 3.0 X 20 -> 3.25 mm,   95% RPDA -- TAXUS DES 3.0 X 16 --> 3.5 mm   CORONARY ANGIOPLASTY WITH STENT  PLACEMENT  11/2001   (Dr. Annis for Dr. Shlomo) - mLAD@D2  - TAXUS EXPRESS DES 3.5 x 15    CORONARY ANGIOPLASTY WITH STENT PLACEMENT  07/29/2018   CORONARY STENT INTERVENTION N/A 07/29/2018   Procedure: CORONARY STENT INTERVENTION;  Surgeon: Anner Alm ORN, MD;  Location: [facility];  Service: Cardiovascular;  Laterality: N/A;   CYSTOSCOPY W/ RETROGRADES  05/04/2012   Procedure: CYSTOSCOPY WITH RETROGRADE PYELOGRAM;  Surgeon: Toribio Neysa Repine, MD;  Location: [facility];  Service: Urology;  Laterality: Left;   CYSTOSCOPY W/ URETERAL STENT PLACEMENT  05/04/2012   Procedure: CYSTOSCOPY WITH STENT REPLACEMENT;  Surgeon: Toribio Neysa Repine, MD;  Location: [facility];  Service: Urology;  Laterality: Left;   CYSTOSCOPY WITH STENT PLACEMENT Left 04/04/2012   CYSTOSCOPY WITH URETEROSCOPY  05/04/2012   Procedure: CYSTOSCOPY WITH URETEROSCOPY;  Surgeon: Toribio Neysa Repine, MD;  Location: [facility];  Service: Urology;  Laterality: Left;       I & D EXTREMITY Right 02/11/2022   Procedure: IRRIGATION AND DEBRIDEMENT  OF HAND AND FOREARM;  Surgeon: Shari Easter, MD;  Location: [facility];  Service: Orthopedics;  Laterality: Right;   LEFT HEART CATH AND CORONARY ANGIOGRAPHY N/A 07/29/2018   Procedure: LEFT HEART CATH AND CORONARY ANGIOGRAPHY;  Surgeon: Anner Alm ORN, MD;  Location: [facility];  Service: Cardiovascular;  Laterality: N/A;   LEFT URETEROSCOPIC STONE EXTRACTION  03-14-2003   X2   PARTIAL KNEE ARTHROPLASTY Left 06/02/2022   Procedure: UNICOMPARTMENTAL KNEE;  Surgeon: Beverley Evalene BIRCH, MD;  Location: [facility];  Service: Orthopedics;  Laterality: Left;   RIGHT/LEFT HEART CATH AND CORONARY ANGIOGRAPHY N/A 08/11/2023   Procedure: RIGHT/LEFT HEART CATH AND CORONARY ANGIOGRAPHY;  Surgeon: Wendel Lurena POUR, MD;  Location: [facility];  Service: Cardiovascular;  Laterality: N/A;   Current Outpatient Medications on File Prior to Visit  Medication Sig Dispense Refill   acetaminophen   (TYLENOL ) 500 MG tablet Take 2 tablets (1,000 mg total) by mouth every 6 (six) hours as needed for mild pain or moderate pain. 60 tablet 0   aspirin  81 MG tablet Take 81 mg by mouth daily.     BRILINTA  60 MG TABS tablet TAKE ONE TABLET BY MOUTH TWO TIMES A DAY 180 tablet 2   celecoxib  (CELEBREX ) 200 MG capsule Take 200 mg by mouth as needed.     Continuous Glucose Receiver (FREESTYLE LIBRE 3 READER) DEVI Use to monitor glucose continuously 1 each 0   Continuous Glucose Sensor (FREESTYLE LIBRE 3 PLUS SENSOR) MISC Inject 1 Device into the skin continuous. Change every 15 days 6 each 3   empagliflozin  (JARDIANCE ) 10 MG TABS tablet Take 1 tablet (10 mg total) by mouth daily before breakfast. 90 tablet 3   furosemide  (LASIX ) 20 MG tablet TAKE ONE TABLET (20 MG TOTAL) BY MOUTH DAILY. 90 tablet 2   glucose blood (ACCU-CHEK AVIVA PLUS) test strip Use as instructed 2x a day 200 each 12   insulin  degludec (TRESIBA  FLEXTOUCH) 200 UNIT/ML FlexTouch Pen Inject 30 Units into the skin daily. 18 mL 3   Insulin  Pen Needle 32G X 4 MM MISC Use 4x a day 300 each 3   isosorbide  mononitrate (IMDUR ) 30 MG 24 hr tablet TAKE ONE TABLET BY MOUTH ONCE DAILY 90 tablet 2   MAGNESIUM PO Take by mouth.     neomycin-bacitracin-polymyxin (NEOSPORIN) OINT Apply 1 Application topically as needed for wound care. (Patient not taking: Reported on 12/08/2024)     nitroGLYCERIN  (NITROLINGUAL ) 0.4 MG/SPRAY spray Place 1 spray under the tongue every 5 (five) minutes x 3 doses as needed for chest pain. 25 g 4   NOVOLOG  FLEXPEN 100 UNIT/ML FlexPen INJECT 20 TO 30 UNITS UNDER THE SKIN TWICE A DAY BEFORE MEALS 60 mL 1   pantoprazole  (PROTONIX ) 40 MG tablet TAKE ONE TABLET (40 MG TOTAL) BY MOUTH DAILY. 90 tablet 0   POTASSIUM PO Take by mouth.     rosuvastatin  (CRESTOR ) 10 MG tablet TAKE ONE TABLET BY MOUTH ONCE DAILY 90 tablet 2   Semaglutide , 2 MG/DOSE, (OZEMPIC , 2 MG/DOSE,) 8 MG/3ML SOPN Inject 2 mg under skin once a week 9 mL 3    spironolactone  (ALDACTONE ) 25 MG tablet TAKE ONE TABLET (25 MG TOTAL) BY MOUTH DAILY. 90 tablet 1   tamsulosin  (FLOMAX ) 0.4 MG CAPS capsule Take 1 capsule (0.4 mg total) by mouth daily. 30 capsule 0   valsartan  (DIOVAN ) 160 MG tablet TAKE 1 TABLET (160 MG TOTAL) BY MOUTH DAILY. 90 tablet 1   No current facility-administered medications on file prior to visit.   Allergies  Allergen Reactions   Beta Adrenergic Blockers     Metoprolol  stopped 07/2018 due to 2nd degree type 2 AV block.   Social History   Socioeconomic History  Marital status: Married    Spouse name: Not on file   Number of children: 2   Years of education: Not on file   Highest education level: Not on file  Occupational History   Not on file  Tobacco Use   Smoking status: Never   Smokeless tobacco: Never  Vaping Use   Vaping status: Never Used  Substance and Sexual Activity   Alcohol use: Never   Drug use: Never   Sexual activity: Not Currently  Other Topics Concern   Not on file  Social History Narrative   Right handed    Lives with wife    Social Drivers of Health   Tobacco Use: Low Risk (12/15/2024)   Patient History    Smoking Tobacco Use: Never    Smokeless Tobacco Use: Never    Passive Exposure: Not on file  Financial Resource Strain: Low Risk (12/16/2023)   Overall Financial Resource Strain (CARDIA)    Difficulty of Paying Living Expenses: Not hard at all  Food Insecurity: No Food Insecurity (12/16/2023)   Hunger Vital Sign    Worried About Running Out of Food in the Last Year: Never true    Ran Out of Food in the Last Year: Never true  Transportation Needs: No Transportation Needs (12/16/2023)   PRAPARE - Administrator, Civil Service (Medical): No    Lack of Transportation (Non-Medical): No  Physical Activity: Inactive (12/16/2023)   Exercise Vital Sign    Days of Exercise per Week: 0 days    Minutes of Exercise per Session: 0 min  Stress: No Stress Concern Present (12/16/2023)    [employer] of Occupational Health - Occupational Stress Questionnaire    Feeling of Stress : Not at all  Social Connections: Moderately Integrated (12/16/2023)   Social Connection and Isolation Panel    Frequency of Communication with Friends and Family: Once a week    Frequency of Social Gatherings with Friends and Family: More than three times a week    Attends Religious Services: More than 4 times per year    Active Member of Clubs or Organizations: No    Attends Banker Meetings: Never    Marital Status: Married  Catering Manager Violence: Not At Risk (12/16/2023)   Humiliation, Afraid, Rape, and Kick questionnaire    Fear of Current or Ex-Partner: No    Emotionally Abused: No    Physically Abused: No    Sexually Abused: No  Depression (PHQ2-9): Low Risk (12/16/2023)   Depression (PHQ2-9)    PHQ-2 Score: 0  Alcohol Screen: Low Risk (12/16/2023)   Alcohol Screen    Last Alcohol Screening Score (AUDIT): 0  Housing: Unknown (12/16/2023)   Housing Stability Vital Sign    Unable to Pay for Housing in the Last Year: No    Number of Times Moved in the Last Year: Not on file    Homeless in the Last Year: No  Utilities: Not At Risk (12/16/2023)   [employer] Utilities    Threatened with loss of utilities: No  Health Literacy: Adequate Health Literacy (12/16/2023)   B1300 Health Literacy    Frequency of need for help with medical instructions: Never      Review of Systems  All other systems reviewed and are negative.      Objective:   Physical Exam Vitals reviewed.  Constitutional:      General: He is not in acute distress.    Appearance: He is well-developed. He is obese. He  is not ill-appearing, toxic-appearing or diaphoretic.  HENT:     Head: Normocephalic and atraumatic.     Right Ear: Tympanic membrane and ear canal normal.     Left Ear: Tympanic membrane and ear canal normal.     Nose: Congestion and rhinorrhea present.     Mouth/Throat:     Mouth: Mucous  membranes are moist.     Pharynx: Oropharynx is clear. No oropharyngeal exudate or posterior oropharyngeal erythema.  Eyes:     Conjunctiva/sclera: Conjunctivae normal.     Pupils: Pupils are equal, round, and reactive to light.  Neck:     Thyroid : No thyromegaly.     Vascular: No JVD.  Cardiovascular:     Rate and Rhythm: Normal rate and regular rhythm.     Heart sounds: Normal heart sounds. No murmur heard. Pulmonary:     Effort: Pulmonary effort is normal. No respiratory distress.     Breath sounds: Normal breath sounds. No wheezing or rales.  Chest:     Chest wall: No tenderness.  Abdominal:     General: Bowel sounds are normal. There is no distension.     Palpations: Abdomen is soft. There is no mass.     Tenderness: There is no abdominal tenderness. There is no guarding or rebound.  Musculoskeletal:     Cervical back: Neck supple.  Lymphadenopathy:     Cervical: No cervical adenopathy.  Skin:    Findings: No erythema or rash.  Neurological:     Mental Status: He is alert.       Assessment & Plan:  Viral upper respiratory tract infection No evidence of pneumonia or bronchitis on exam.  Therefore symptoms are consistent with a viral upper respiratory infection.  For that reason I will screen the patient for influenza.  Treat the patient symptomatically with Coricidin HBP for congestion and Hycodan 1 teaspoon every 8 6 hours as needed for cough.  Flu test is negative.   "

## 2024-12-18 ENCOUNTER — Encounter: Admitting: Family Medicine

## 2024-12-21 ENCOUNTER — Ambulatory Visit: Payer: Self-pay

## 2024-12-21 NOTE — Telephone Encounter (Signed)
 FYI Only or Action Required?: FYI only for provider: appointment scheduled on 1/30.  Patient was last seen in primary care on 12/15/2024 by Duanne Butler DASEN, MD.  Called Nurse Triage reporting Diarrhea.  Symptoms began yesterday.  Interventions attempted: Nothing.  Symptoms are: unchanged.  Triage Disposition: See Physician Within 24 Hours  Patient/caregiver understands and will follow disposition?: Yes, will follow disposition  Reason for Triage: coughing phlegm ( brownish) and diarea black    Reason for Disposition  [1] MODERATE diarrhea (e.g., 4-6 times / day more than normal) AND [2] age > 70 years  Answer Assessment - Initial Assessment Questions 1. DIARRHEA SEVERITY: How bad is the diarrhea? How many more stools have you had in the past 24 hours than normal?      6 times 2. ONSET: When did the diarrhea begin?      yesterday 3. STOOL DESCRIPTION:  How loose or watery is the diarrhea? What is the stool color? Is there any blood or mucous in the stool?     both 4. VOMITING: Are you also vomiting? If Yes, ask: How many times in the past 24 hours?      denies 5. ABDOMEN PAIN: Are you having any abdomen pain? If Yes, ask: What does it feel like? (e.g., crampy, dull, intermittent, constant)      denies 8. HYDRATION: Any signs of dehydration? (e.g., dry mouth [not just dry lips], too weak to stand, dizziness, new weight loss) When did you last urinate?     Drinks lots of water 9. EXPOSURE: Have you traveled to a foreign country recently? Have you been exposed to anyone with diarrhea? Could you have eaten any food that was spoiled?     denies 10. ANTIBIOTIC USE: Are you taking antibiotics now or have you taken antibiotics in the past 2 months?       yes 11. OTHER SYMPTOMS: Do you have any other symptoms? (e.g., fever, blood in stool)       Dark stool  Denies dizziness, denies SOB,  Protocols used: Diarrhea-A-AH

## 2024-12-22 ENCOUNTER — Ambulatory Visit: Admitting: Family Medicine

## 2025-01-10 ENCOUNTER — Encounter (HOSPITAL_BASED_OUTPATIENT_CLINIC_OR_DEPARTMENT_OTHER)

## 2025-01-17 ENCOUNTER — Encounter (HOSPITAL_BASED_OUTPATIENT_CLINIC_OR_DEPARTMENT_OTHER)

## 2025-03-28 ENCOUNTER — Ambulatory Visit: Admitting: Internal Medicine
